# Patient Record
Sex: Male | Born: 1945
Health system: Southern US, Community
[De-identification: ages and names within clinical notes are randomized; demographics above are authoritative.]

## PROBLEM LIST (undated history)

## (undated) DIAGNOSIS — E785 Hyperlipidemia, unspecified: Secondary | ICD-10-CM

## (undated) DIAGNOSIS — R51 Headache: Secondary | ICD-10-CM

## (undated) DIAGNOSIS — I2510 Atherosclerotic heart disease of native coronary artery without angina pectoris: Secondary | ICD-10-CM

## (undated) DIAGNOSIS — R7303 Prediabetes: Secondary | ICD-10-CM

## (undated) DIAGNOSIS — I340 Nonrheumatic mitral (valve) insufficiency: Secondary | ICD-10-CM

## (undated) DIAGNOSIS — I495 Sick sinus syndrome: Secondary | ICD-10-CM

## (undated) DIAGNOSIS — Z973 Presence of spectacles and contact lenses: Secondary | ICD-10-CM

## (undated) DIAGNOSIS — N183 Chronic kidney disease, stage 3 unspecified: Secondary | ICD-10-CM

## (undated) DIAGNOSIS — Z9889 Other specified postprocedural states: Secondary | ICD-10-CM

## (undated) DIAGNOSIS — E559 Vitamin D deficiency, unspecified: Secondary | ICD-10-CM

## (undated) DIAGNOSIS — I428 Other cardiomyopathies: Secondary | ICD-10-CM

## (undated) DIAGNOSIS — I10 Essential (primary) hypertension: Secondary | ICD-10-CM

## (undated) DIAGNOSIS — H269 Unspecified cataract: Secondary | ICD-10-CM

## (undated) DIAGNOSIS — I493 Ventricular premature depolarization: Secondary | ICD-10-CM

## (undated) DIAGNOSIS — I2699 Other pulmonary embolism without acute cor pulmonale: Secondary | ICD-10-CM

## (undated) DIAGNOSIS — R519 Headache, unspecified: Secondary | ICD-10-CM

## (undated) DIAGNOSIS — K4090 Unilateral inguinal hernia, without obstruction or gangrene, not specified as recurrent: Secondary | ICD-10-CM

## (undated) DIAGNOSIS — M1990 Unspecified osteoarthritis, unspecified site: Secondary | ICD-10-CM

## (undated) DIAGNOSIS — T7840XA Allergy, unspecified, initial encounter: Secondary | ICD-10-CM

## (undated) DIAGNOSIS — J302 Other seasonal allergic rhinitis: Secondary | ICD-10-CM

## (undated) HISTORY — DX: Ventricular premature depolarization: I49.3

## (undated) HISTORY — DX: Atherosclerotic heart disease of native coronary artery without angina pectoris: I25.10

## (undated) HISTORY — DX: Chronic kidney disease, stage 3 (moderate): N18.3

## (undated) HISTORY — PX: OTHER SURGICAL HISTORY: SHX169

## (undated) HISTORY — PX: COLONOSCOPY: SHX174

## (undated) HISTORY — DX: Vitamin D deficiency, unspecified: E55.9

## (undated) HISTORY — DX: Other cardiomyopathies: I42.8

## (undated) HISTORY — DX: Allergy, unspecified, initial encounter: T78.40XA

## (undated) HISTORY — DX: Other seasonal allergic rhinitis: J30.2

## (undated) HISTORY — DX: Chronic kidney disease, stage 3 unspecified: N18.30

## (undated) HISTORY — DX: Unspecified osteoarthritis, unspecified site: M19.90

## (undated) HISTORY — DX: Essential (primary) hypertension: I10

## (undated) HISTORY — DX: Other pulmonary embolism without acute cor pulmonale: I26.99

## (undated) HISTORY — PX: COLONOSCOPY W/ BIOPSIES AND POLYPECTOMY: SHX1376

## (undated) HISTORY — PX: HIP ARTHROPLASTY: SHX981

## (undated) HISTORY — DX: Hyperlipidemia, unspecified: E78.5

## (undated) HISTORY — DX: Sick sinus syndrome: I49.5

## (undated) HISTORY — PX: CARDIAC CATHETERIZATION: SHX172

## (undated) HISTORY — DX: Unspecified cataract: H26.9

## (undated) HISTORY — DX: Nonrheumatic mitral (valve) insufficiency: I34.0

---

## 2000-02-08 ENCOUNTER — Encounter: Payer: Self-pay | Admitting: Family Medicine

## 2000-02-08 ENCOUNTER — Ambulatory Visit (HOSPITAL_COMMUNITY): Admission: RE | Admit: 2000-02-08 | Discharge: 2000-02-08 | Payer: Self-pay

## 2001-12-22 ENCOUNTER — Encounter: Admission: RE | Admit: 2001-12-22 | Discharge: 2001-12-22 | Payer: Self-pay | Admitting: Family Medicine

## 2001-12-22 ENCOUNTER — Encounter: Payer: Self-pay | Admitting: Family Medicine

## 2005-12-16 ENCOUNTER — Encounter: Payer: Self-pay | Admitting: Gastroenterology

## 2006-06-24 ENCOUNTER — Ambulatory Visit (HOSPITAL_COMMUNITY): Admission: RE | Admit: 2006-06-24 | Discharge: 2006-06-24 | Payer: Self-pay | Admitting: Internal Medicine

## 2007-01-16 ENCOUNTER — Ambulatory Visit (HOSPITAL_COMMUNITY): Admission: RE | Admit: 2007-01-16 | Discharge: 2007-01-16 | Payer: Self-pay | Admitting: Specialist

## 2007-01-26 ENCOUNTER — Encounter: Admission: RE | Admit: 2007-01-26 | Discharge: 2007-01-26 | Payer: Self-pay | Admitting: Specialist

## 2007-02-20 ENCOUNTER — Encounter: Admission: RE | Admit: 2007-02-20 | Discharge: 2007-02-20 | Payer: Self-pay | Admitting: Specialist

## 2007-03-06 ENCOUNTER — Encounter: Admission: RE | Admit: 2007-03-06 | Discharge: 2007-03-06 | Payer: Self-pay | Admitting: Specialist

## 2007-10-31 ENCOUNTER — Ambulatory Visit: Payer: Self-pay

## 2007-11-01 ENCOUNTER — Ambulatory Visit (HOSPITAL_COMMUNITY): Admission: RE | Admit: 2007-11-01 | Discharge: 2007-11-01 | Payer: Self-pay | Admitting: Orthopedic Surgery

## 2007-11-16 ENCOUNTER — Ambulatory Visit: Payer: Self-pay | Admitting: Cardiovascular Disease

## 2007-11-16 LAB — CONVERTED CEMR LAB
Basophils Absolute: 0 10*3/uL (ref 0.0–0.1)
CO2: 28 meq/L (ref 19–32)
Calcium: 9.2 mg/dL (ref 8.4–10.5)
Chloride: 104 meq/L (ref 96–112)
Lymphocytes Relative: 44.2 % (ref 12.0–46.0)
MCHC: 34.4 g/dL (ref 30.0–36.0)
Neutro Abs: 1.9 10*3/uL (ref 1.4–7.7)
Neutrophils Relative %: 46.8 % (ref 43.0–77.0)
Prothrombin Time: 13 s (ref 10.9–13.3)
RDW: 12.7 % (ref 11.5–14.6)
Sodium: 139 meq/L (ref 135–145)
aPTT: 28.7 s (ref 21.7–29.8)

## 2007-11-17 ENCOUNTER — Inpatient Hospital Stay (HOSPITAL_BASED_OUTPATIENT_CLINIC_OR_DEPARTMENT_OTHER): Admission: RE | Admit: 2007-11-17 | Discharge: 2007-11-17 | Payer: Self-pay | Admitting: Cardiovascular Disease

## 2007-11-17 ENCOUNTER — Ambulatory Visit: Payer: Self-pay | Admitting: Internal Medicine

## 2007-11-21 ENCOUNTER — Ambulatory Visit: Payer: Self-pay | Admitting: Cardiovascular Disease

## 2007-11-30 ENCOUNTER — Ambulatory Visit: Payer: Self-pay | Admitting: Cardiovascular Disease

## 2007-12-20 ENCOUNTER — Inpatient Hospital Stay (HOSPITAL_COMMUNITY): Admission: RE | Admit: 2007-12-20 | Discharge: 2007-12-23 | Payer: Self-pay | Admitting: Orthopedic Surgery

## 2008-04-12 ENCOUNTER — Ambulatory Visit (HOSPITAL_COMMUNITY): Admission: RE | Admit: 2008-04-12 | Discharge: 2008-04-12 | Payer: Self-pay | Admitting: Internal Medicine

## 2008-05-07 ENCOUNTER — Ambulatory Visit (HOSPITAL_COMMUNITY): Admission: RE | Admit: 2008-05-07 | Discharge: 2008-05-07 | Payer: Self-pay | Admitting: Internal Medicine

## 2008-05-24 ENCOUNTER — Encounter (INDEPENDENT_AMBULATORY_CARE_PROVIDER_SITE_OTHER): Payer: Self-pay | Admitting: Emergency Medicine

## 2008-05-24 ENCOUNTER — Ambulatory Visit: Payer: Self-pay | Admitting: Cardiovascular Disease

## 2008-05-24 ENCOUNTER — Ambulatory Visit: Payer: Self-pay | Admitting: Vascular Surgery

## 2008-05-24 ENCOUNTER — Inpatient Hospital Stay (HOSPITAL_COMMUNITY): Admission: EM | Admit: 2008-05-24 | Discharge: 2008-05-26 | Payer: Self-pay | Admitting: Emergency Medicine

## 2008-05-25 ENCOUNTER — Encounter (INDEPENDENT_AMBULATORY_CARE_PROVIDER_SITE_OTHER): Payer: Self-pay | Admitting: *Deleted

## 2008-05-29 ENCOUNTER — Ambulatory Visit: Payer: Self-pay | Admitting: Cardiology

## 2008-05-29 LAB — CONVERTED CEMR LAB: Prothrombin Time: 27.3 s — ABNORMAL HIGH (ref 10.9–13.3)

## 2008-05-31 ENCOUNTER — Ambulatory Visit: Payer: Self-pay | Admitting: Cardiovascular Disease

## 2008-06-07 ENCOUNTER — Ambulatory Visit: Payer: Self-pay | Admitting: Cardiology

## 2008-06-10 ENCOUNTER — Ambulatory Visit: Payer: Self-pay | Admitting: Cardiovascular Disease

## 2008-06-10 ENCOUNTER — Encounter: Payer: Self-pay | Admitting: Cardiovascular Disease

## 2008-06-10 DIAGNOSIS — I429 Cardiomyopathy, unspecified: Secondary | ICD-10-CM | POA: Insufficient documentation

## 2008-06-10 DIAGNOSIS — Z86711 Personal history of pulmonary embolism: Secondary | ICD-10-CM

## 2008-06-17 ENCOUNTER — Ambulatory Visit: Payer: Self-pay | Admitting: Internal Medicine

## 2008-06-28 ENCOUNTER — Ambulatory Visit: Payer: Self-pay | Admitting: Internal Medicine

## 2008-07-12 ENCOUNTER — Ambulatory Visit: Payer: Self-pay | Admitting: Cardiology

## 2008-08-09 ENCOUNTER — Ambulatory Visit: Payer: Self-pay | Admitting: Internal Medicine

## 2008-08-20 ENCOUNTER — Encounter: Payer: Self-pay | Admitting: *Deleted

## 2008-09-03 ENCOUNTER — Encounter (INDEPENDENT_AMBULATORY_CARE_PROVIDER_SITE_OTHER): Payer: Self-pay | Admitting: Cardiology

## 2008-09-03 ENCOUNTER — Ambulatory Visit: Payer: Self-pay | Admitting: Internal Medicine

## 2008-09-03 LAB — CONVERTED CEMR LAB: Protime: 18.2

## 2008-09-25 ENCOUNTER — Encounter: Payer: Self-pay | Admitting: *Deleted

## 2008-10-01 ENCOUNTER — Encounter (INDEPENDENT_AMBULATORY_CARE_PROVIDER_SITE_OTHER): Payer: Self-pay | Admitting: Cardiology

## 2008-10-01 ENCOUNTER — Ambulatory Visit: Payer: Self-pay | Admitting: Cardiovascular Disease

## 2008-10-10 ENCOUNTER — Telehealth: Payer: Self-pay | Admitting: Cardiovascular Disease

## 2008-11-08 ENCOUNTER — Emergency Department (HOSPITAL_COMMUNITY): Admission: EM | Admit: 2008-11-08 | Discharge: 2008-11-08 | Payer: Self-pay | Admitting: Emergency Medicine

## 2008-11-08 ENCOUNTER — Ambulatory Visit: Payer: Self-pay | Admitting: Cardiology

## 2008-11-14 ENCOUNTER — Encounter: Payer: Self-pay | Admitting: Internal Medicine

## 2008-11-15 ENCOUNTER — Ambulatory Visit (HOSPITAL_COMMUNITY): Admission: RE | Admit: 2008-11-15 | Discharge: 2008-11-15 | Payer: Self-pay | Admitting: Internal Medicine

## 2008-11-18 ENCOUNTER — Encounter (INDEPENDENT_AMBULATORY_CARE_PROVIDER_SITE_OTHER): Payer: Self-pay | Admitting: Cardiology

## 2008-11-28 ENCOUNTER — Ambulatory Visit: Payer: Self-pay | Admitting: Cardiovascular Disease

## 2008-11-28 ENCOUNTER — Ambulatory Visit: Payer: Self-pay

## 2008-11-28 ENCOUNTER — Encounter: Payer: Self-pay | Admitting: Cardiovascular Disease

## 2008-12-06 ENCOUNTER — Ambulatory Visit: Payer: Self-pay

## 2008-12-06 ENCOUNTER — Encounter: Payer: Self-pay | Admitting: Cardiovascular Disease

## 2008-12-10 ENCOUNTER — Encounter: Payer: Self-pay | Admitting: Cardiology

## 2009-04-22 ENCOUNTER — Encounter (INDEPENDENT_AMBULATORY_CARE_PROVIDER_SITE_OTHER): Payer: Self-pay

## 2009-04-22 LAB — HM COLONOSCOPY

## 2009-04-23 ENCOUNTER — Ambulatory Visit: Payer: Self-pay | Admitting: Internal Medicine

## 2009-04-23 ENCOUNTER — Encounter (INDEPENDENT_AMBULATORY_CARE_PROVIDER_SITE_OTHER): Payer: Self-pay | Admitting: *Deleted

## 2009-04-29 ENCOUNTER — Telehealth: Payer: Self-pay | Admitting: Internal Medicine

## 2009-05-06 ENCOUNTER — Ambulatory Visit: Payer: Self-pay | Admitting: Internal Medicine

## 2009-05-08 ENCOUNTER — Encounter: Payer: Self-pay | Admitting: Internal Medicine

## 2009-08-12 ENCOUNTER — Ambulatory Visit: Payer: Self-pay | Admitting: Cardiovascular Disease

## 2010-03-05 ENCOUNTER — Encounter: Payer: Self-pay | Admitting: Cardiovascular Disease

## 2010-03-05 ENCOUNTER — Ambulatory Visit (HOSPITAL_COMMUNITY)
Admission: RE | Admit: 2010-03-05 | Discharge: 2010-03-05 | Payer: Self-pay | Source: Home / Self Care | Attending: Cardiovascular Disease | Admitting: Cardiovascular Disease

## 2010-03-05 ENCOUNTER — Ambulatory Visit: Payer: Self-pay | Admitting: Cardiovascular Disease

## 2010-04-23 NOTE — Procedures (Signed)
Summary: Colonoscopy  Patient: [name] Note: All result statuses are Final unless otherwise noted.  Tests: (1) Colonoscopy (COL)   COL Colonoscopy           DONE     [facility]     [address]     [place], [place]  [address]           COLONOSCOPY PROCEDURE REPORT           PATIENT:  Marcus, Griffin  MR#:  [id number]     BIRTHDATE:  1945/05/29, 63 yrs. old  GENDER:  male           ENDOSCOPIST:  Wilhemina Bonito. Eda Keys, MD     Referred by:  Marisue Brooklyn, D.O.           PROCEDURE DATE:  05/06/2009     PROCEDURE:  Colonoscopy with snare polypectomy     ASA CLASS:  Class II     INDICATIONS:  history of pre-cancerous (adenomatous) colon polyps     (index exam 11-2005 w/ hp and adenomas - Dr. Kinnie Scales)           MEDICATIONS:   Fentanyl 75 mcg IV, Versed 7 mg IV           DESCRIPTION OF PROCEDURE:   After the risks benefits and     alternatives of the procedure were thoroughly explained, informed     consent was obtained.  Digital rectal exam was performed and     revealed no abnormalities.   The LB [id number] endoscope     was introduced through the anus and advanced to the cecum, which     was identified by both the appendix and ileocecal valve, without     limitations. Time to cecum = 2:21 min. The quality of the prep was     excellent, using MoviPrep.  The instrument was then slowly     withdrawn (time = 12:15 min) as the colon was fully examined.     <<PROCEDUREIMAGES>>           FINDINGS:  A diminutive polyp was found in the sigmoid colon.     Polyp was snared without cautery. Retrieval was successful. snare     polyp  Moderate diverticulosis was found throughout the colon.     Retroflexed views in the rectum revealed no abnormalities.    The     scope was then withdrawn from the patient and the procedure     completed.           COMPLICATIONS:  None           ENDOSCOPIC IMPRESSION:     1) Diminutive polyp in the sigmoid colon - removed     2) Moderate  diverticulosis throughout the colon           RECOMMENDATIONS:     1) Follow up colonoscopy in 5 years           ______________________________     Wilhemina Bonito. Eda Keys, MD           CC:  Marisue Brooklyn, DO; The Patient           n.     eSIGNED:   John N. Eda Keys at 05/06/2009 11:37 AM           [place] Fleming, [id number]  Note: An exclamation mark (!) indicates a result that was not dispersed into the flowsheet. Document Creation Date: 05/06/2009 11:37 AM  _______________________________________________________________________  (1) Order result status: Final Collection or observation date-time: 05/06/2009 11:32 Requested date-time:  Receipt date-time:  Reported date-time:  Referring Physician:   Ordering Physician: Fransico Setters [phone]) Specimen Source:  Source: Launa Grill Order Number: [id number] Lab site:   Appended Document: Colonoscopy     Procedures Next Due Date:    Colonoscopy: 04/2014

## 2010-04-23 NOTE — Letter (Signed)
Summary: Pre Visit No Show Letter  [facility]  [address] [place], [place] [address]   Phone: [phone]  Fax: [phone]        April 22, 2009 MRN: [id number]    [facility] [address] [place], [place]  [address]    Dear Mr. Lawrence,   We have been unable to reach you by phone concerning the pre-procedure visit that you missed on 04/22/2009. For this reason,your procedure scheduled on 05/06/2009 has been cancelled. Our scheduling staff will gladly assist you with rescheduling your appointments at a more convenient time. Please call our office at [phone] between the hours of 8:00am and 5:00pm, press option #2 to reach an appointment scheduler. Please consider updating your contact numbers at this time so that we can reach you by phone in the future with schedule changes or results.    Thank you,    Ulis Rias RN [facility]

## 2010-04-23 NOTE — Assessment & Plan Note (Signed)
Summary: [facility]   Visit Type:  Follow-up Primary Provider:  Marisue Brooklyn, DO  CC:  Right hip pain.  History of Present Illness: 65 yo AAM with h/o HTN, hyperlipidemia, non-obstructive CAD by cath 8/09 and mild LV dysfunction who had a hip replacement 9/09 and presented with SOB 3/10 and was found to have bilateral pulmonary emboli.  Repeat CT chest 11/14/08 with no evidence of PE. Lower extremity venous dopplers 9/10 with no evidence of DVT. Coumadin therapy was stopped 9/10. Echo  11/28/08 with normal LV size and systolic function with grade 2 diastolic dysfunction and mild to moderate MR, trivial AI.   He has been doing well. He tells me that his shoulder pain is better. He has had no chest pains with exertion. His energy level is ok. He does have dyspnea with moderate exertion but this is stable. He has had no swelling in his legs. He has had no palpitations or awareness of irregularity of his heart rhythm.  He continues to have right hip pain with ambulation.   Current Medications (verified): 1)  Amlodipine Besy-Benazepril Hcl 5-10 Mg Caps (Amlodipine Besy-Benazepril Hcl) .Marland Kitchen.. 1 Tab Once Daily 2)  Vitamin D3 5000 Units .Marland Kitchen.. 1 Tab Once Daily 3)  Tylenol Extra Strength 500 Mg Tabs (Acetaminophen) .... Take As Directed..prn 4)  Mucinex 600 Mg Xr12h-Tab (Guaifenesin) .... Take As Directed..prn 5)  Aspirin 81 Mg Tbec (Aspirin) .... Take One Tablet By Mouth Daily 6)  Claritin 10 Mg Tabs (Loratadine) .... As Needed 7)  Zyrtec Allergy 10 Mg Caps (Cetirizine Hcl) .... As Needed \\par  8)  Fenofibrate Micronized 134 Mg Caps (Fenofibrate Micronized) .... Take 1 Capsule By Mouth Once A Day 9)  Zetia 10 Mg Tabs (Ezetimibe) .... Take 1 By Mouth Once Daily  Allergies: 1)  ! * Statins  Past History:  Past Medical History: Reviewed history from 08/12/2009 and no changes required. Hyperlipidemia Hypertension Seasonal allergies Osteoarthritis Bilateral Pulmonary Embolus March  2010 Non-obstructive CAD by cath 2009  Social History: Reviewed history from 08/12/2009 and no changes required. Works full Time-industrial hygeinist Married  Tobacco Use - Former.  Alcohol Use - no Drug Use - no  Review of Systems       The patient complains of fatigue.  The patient denies malaise, fever, weight gain/loss, vision loss, decreased hearing, hoarseness, chest pain, palpitations, shortness of breath, prolonged cough, wheezing, sleep apnea, coughing up blood, abdominal pain, blood in stool, nausea, vomiting, diarrhea, heartburn, incontinence, blood in urine, muscle weakness, joint pain, leg swelling, rash, skin lesions, headache, fainting, dizziness, depression, anxiety, enlarged lymph nodes, easy bruising or bleeding, and environmental allergies.         right hip pain  Vital Signs:  Patient profile:   65 year old male Height:      72 inches Weight:      224 pounds BMI:     30.49 Pulse rate:   60 / minute Pulse rhythm:   irregular BP sitting:   130 / 70  (left arm) Cuff size:   regular  Vitals Entered By: Stanton Kidney, EMT-P (March 05, 2010 2:30 PM)  Physical Exam  General:  General: Well developed, well nourished, NAD Musculoskeletal: Muscle strength 5/5 all ext Psychiatric: Mood and affect normal Neck: No JVD, no carotid bruits, no thyromegaly, no lymphadenopathy. Lungs:Clear bilaterally, no wheezes, rhonci, crackles CV: RRR no murmurs, gallops rubs Abdomen: soft, NT, ND, BS present Extremities: No edema, pulses 2+.    Impression & Recommendations:  Problem #  1:  CAD, NATIVE VESSEL (ICD-414.01) Stable. Continue ASA. He has not tolerated beta blockers in the past because of his bradycardia. He is intolerant of statins. He is on Zetia. His lipids are followed in primary care. No changes.   His updated medication list for this problem includes:    Amlodipine Besy-benazepril Hcl 5-10 Mg Caps (Amlodipine besy-benazepril hcl) .Marland Kitchen... 1 tab once daily     Aspirin 81 Mg Tbec (Aspirin) .Marland Kitchen... Take one tablet by mouth daily  Problem # 2:  MITRAL VALVE DISORDERS (ICD-424.0) Mild to moderate MR. Stable. Repeat echo in one year.   His updated medication list for this problem includes:    Amlodipine Besy-benazepril Hcl 5-10 Mg Caps (Amlodipine besy-benazepril hcl) .Marland Kitchen... 1 tab once daily  Patient Instructions: 1)  Your physician recommends that you schedule a follow-up appointment in: 6 months.

## 2010-04-23 NOTE — Letter (Signed)
Summary: Patient Notice- Polyp Results  [facility]  [address] [place], [place] [address]   Phone: [phone]  Fax: [phone]        May 08, 2009 MRN: [id number]    [facility] [address] [place], [place]  [address]    Dear Mr. Hyun,  I am pleased to inform you that the colon polyp(s) removed during your recent colonoscopy was (were) found to be benign (no cancer detected) upon pathologic examination.  I recommend you have a repeat colonoscopy examination in 5 years to look for recurrent polyps, as having colon polyps increases your risk for having recurrent polyps or even colon cancer in the future.  Should you develop new or worsening symptoms of abdominal pain, bowel habit changes or bleeding from the rectum or bowels, please schedule an evaluation with either your primary care physician or with me.  Additional information/recommendations:  __ No further action with gastroenterology is needed at this time. Please      follow-up with your primary care physician for your other healthcare      needs.  Please call us if you are having persistent problems or have questions about your condition that have not been fully answered at this time.  Sincerely,  Hilarie Fredrickson MD  This letter has been electronically signed by your physician.  Appended Document: Patient Notice- Polyp Results Letter mailed 2.18.11

## 2010-04-23 NOTE — Miscellaneous (Signed)
Summary: dir col scr-age,....em  Clinical Lists Changes  Medications: Added new medication of MOVIPREP 100 GM  SOLR (PEG-KCL-NACL-NASULF-NA ASC-C) As per prep instructions. - Signed Rx of MOVIPREP 100 GM  SOLR (PEG-KCL-NACL-NASULF-NA ASC-C) As per prep instructions.;  #1 x 0;  Signed;  Entered by: Harlow Mares CMA (AAMA);  Authorized by: Hilarie Fredrickson MD;  Method used: Electronically to [facility]*, [address] Shipping/mailing, [place], [place]  [address], Ph: [phone], Fax: [phone]    Prescriptions: MOVIPREP 100 GM  SOLR (PEG-KCL-NACL-NASULF-NA ASC-C) As per prep instructions.  #1 x 0   Entered by:   Harlow Mares CMA (AAMA)   Authorized by:   Hilarie Fredrickson MD   Signed by:   Harlow Mares CMA (AAMA) on 04/23/2009   Method used:   Electronically to        [name]* (retail)       [address]       [address] Shipping/mailing       [place], [place]  [address]       Ph: [phone]       Fax: [phone]   RxID:   [id number]

## 2010-04-23 NOTE — Letter (Signed)
Summary: [facility] Instructions  [facility]  [address] [place], [place] [address]   Phone: [phone]  Fax: [phone]       [name]    1945/10/23    MRN: [id number]        Procedure Day /Date: Tuesday/  05/06/2009     Arrival Time: 9:00am       Procedure Time: 10:00am     Location of Procedure:                     X  [facility] (4th Floor)                       PREPARATION FOR COLONOSCOPY WITH MOVIPREP   Starting 5 days prior to your procedure 05/01/2009 do not eat nuts, seeds, popcorn, corn, beans, peas,  salads, or any raw vegetables.  Do not take any fiber supplements (e.g. Metamucil, Citrucel, and Benefiber).  THE DAY BEFORE YOUR PROCEDURE         DATE: 05/05/2009  DAY: Monday 1.  Drink clear liquids the entire day-NO SOLID FOOD  2.  Do not drink anything colored red or purple.  Avoid juices with pulp.  No orange juice.  3.  Drink at least 64 oz. (8 glasses) of fluid/clear liquids during the day to prevent dehydration and help the prep work efficiently.  CLEAR LIQUIDS INCLUDE: Water Jello Ice Popsicles Tea (sugar ok, no milk/cream) Powdered fruit flavored drinks Coffee (sugar ok, no milk/cream) Gatorade Juice: apple, white grape, white cranberry  Lemonade Clear bullion, consomm, broth Carbonated beverages (any kind) Strained chicken noodle soup Hard Candy                             4.  In the morning, mix first dose of MoviPrep solution:    Empty 1 Pouch A and 1 Pouch B into the disposable container    Add lukewarm drinking water to the top line of the container. Mix to dissolve    Refrigerate (mixed solution should be used within 24 hrs)  5.  Begin drinking the prep at 5:00 p.m. The MoviPrep container is divided by 4 marks.   Every 15 minutes drink the solution down to the next mark (approximately 8 oz) until the full liter is complete.   6.  Follow completed prep with 16 oz of clear liquid of your choice (Nothing  red or purple).  Continue to drink clear liquids until bedtime.  7.  Before going to bed, mix second dose of MoviPrep solution:    Empty 1 Pouch A and 1 Pouch B into the disposable container    Add lukewarm drinking water to the top line of the container. Mix to dissolve    Refrigerate  THE DAY OF YOUR PROCEDURE      DATE: 05/06/2009 DAY: Tuesday  Beginning at 5:00a.m. (5 hours before procedure):         1. Every 15 minutes, drink the solution down to the next mark (approx 8 oz) until the full liter is complete.  2. Follow completed prep with 16 oz. of clear liquid of your choice.    3. You may drink clear liquids until 8:00a.m. (2 HOURS BEFORE PROCEDURE).   MEDICATION INSTRUCTIONS  Unless otherwise instructed, you should take regular prescription medications with a small sip of water   as early as possible the morning of your procedure.  Teryl Lucy will contact you about your Coumadin after Dr. Marina Goodell reviews  stopping it with your cardiologist.          OTHER INSTRUCTIONS  You will need a responsible adult at least 64 years of age to accompany you and drive you home.   This person must remain in the waiting room during your procedure.  Wear loose fitting clothing that is easily removed.  Leave jewelry and other valuables at home.  However, you may wish to bring a book to read or  an iPod/MP3 player to listen to music as you wait for your procedure to start.  Remove all body piercing jewelry and leave at home.  Total time from sign-in until discharge is approximately 2-3 hours.  You should go home directly after your procedure and rest.  You can resume normal activities the  day after your procedure.  The day of your procedure you should not:   Drive   Make legal decisions   Operate machinery   Drink alcohol   Return to work  You will receive specific instructions about eating, activities and medications before you leave.    The above instructions  have been reviewed and explained to me by   _______________________    I fully understand and can verbalize these instructions _____________________________ Date _________

## 2010-04-23 NOTE — Progress Notes (Signed)
Summary: prep ?'s   Phone Note Call from Patient Call back at Work Phone [phone]   Caller: Patient Call For: Dr. Marina Goodell Reason for Call: Talk to Nurse Summary of Call: has prep questions Initial call taken by: Vallarie Mare,  April 29, 2009 1:49 PM  Follow-up for Phone Call        Pt. does not know who did prior colon but thought Dr.Stevenson may know. Message left for medical records at her office.     Appended Document: prep ?'s  Copy of prior colon done by Dr.Belasco in 2007 rec'd and given to Dr.Artasia Thang for review.

## 2010-04-23 NOTE — Procedures (Signed)
Summary: [facility]  [facility]   Imported By: Lennie Odor 04/30/2009 16:53:35  _____________________________________________________________________  External Attachment:    Type:   Image     Comment:   External Document

## 2010-04-23 NOTE — Assessment & Plan Note (Signed)
Summary: rov  Medications Added CLARITIN 10 MG TABS (LORATADINE) as needed ZYRTEC ALLERGY 10 MG CAPS (CETIRIZINE HCL) as needed \\par  MAGNESIUM OXIDE 250 MG TABS (MAGNESIUM OXIDE) Take 1 tablet by mouth two times a day FENOFIBRATE MICRONIZED 134 MG CAPS (FENOFIBRATE MICRONIZED) Take 1 capsule by mouth once a day        Visit Type:  Follow-up Primary Provider:  Marisue Brooklyn, DO  CC:  Some chest pains and cough.  History of Present Illness: 65 yo AAM with h/o HTN, hyperlipidemia, non-obstructive CAD by cath 8/09 and mild LV dysfunction who had a hip replacement 9/09 and presented with SOB 3/10 and was found to have bilateral pulmonary emboli.  Repeat CT chest 11/14/08 with no evidence of PE. Lower extremity venous dopplers 9/10 with no evidence of DVT. Coumadin therapy was stopped 9/10. Echo  11/28/08 with normal LV size and systolic function with grade 2 diastolic dysfunction and mild to moderate MR, trivial AI.   He has been doing well. He tells me that his shoulder pain is better. He has had no chest pains with exertion. He has had no SOB. He has had no swelling in his legs. He has had no palpitations or awareness of irregularity of his heart rhythm. He has not been walking much lately because of hip pain.     Current Medications (verified): 1)  Amlodipine Besy-Benazepril Hcl 5-10 Mg Caps (Amlodipine Besy-Benazepril Hcl) .Marland Kitchen.. 1 Tab Once Daily 2)  Vitamin D3 5000 Units .Marland Kitchen.. 1 Tab Once Daily 3)  Tylenol Extra Strength 500 Mg Tabs (Acetaminophen) .... Take As Directed..prn 4)  Mucinex 600 Mg Xr12h-Tab (Guaifenesin) .... Take As Directed..prn 5)  Aspirin 81 Mg Tbec (Aspirin) .... Take One Tablet By Mouth Daily 6)  Claritin 10 Mg Tabs (Loratadine) .... As Needed 7)  Zyrtec Allergy 10 Mg Caps (Cetirizine Hcl) .... As Needed \\par  8)  Magnesium Oxide 250 Mg Tabs (Magnesium Oxide) .... Take 1 Tablet By Mouth Two Times A Day 9)  Fenofibrate Micronized 134 Mg Caps (Fenofibrate Micronized) .... Take  1 Capsule By Mouth Once A Day  Allergies: 1)  ! * Statins  Past History:  Past Medical History: Hyperlipidemia Hypertension Seasonal allergies Osteoarthritis Bilateral Pulmonary Embolus March 2010 Non-obstructive CAD by cath 2009  Social History: Works full Time  Married  Tobacco Use - Former.  Alcohol Use - no Drug Use - no  Review of Systems  The patient denies fatigue, malaise, fever, weight gain/loss, vision loss, decreased hearing, hoarseness, chest pain, palpitations, shortness of breath, prolonged cough, wheezing, sleep apnea, coughing up blood, abdominal pain, blood in stool, nausea, vomiting, diarrhea, heartburn, incontinence, blood in urine, muscle weakness, joint pain, leg swelling, rash, skin lesions, headache, fainting, dizziness, depression, anxiety, enlarged lymph nodes, easy bruising or bleeding, and environmental allergies.    Vital Signs:  Patient profile:   65 year old male Height:      72 inches Weight:      227 pounds BMI:     30.90 Pulse rate:   79 / minute Pulse rhythm:   regular Resp:     18 per minute BP sitting:   131 / 77  (right arm) Cuff size:   large  Vitals Entered By: Vikki Ports (Aug 12, 2009 12:05 PM)  Physical Exam  General:  General: Well developed, well nourished, NAD HEENT: OP clear, mucus membranes moist Psychiatric: Mood and affect normal Neck: No JVD, no carotid bruits, no thyromegaly, no lymphadenopathy. Lungs:Clear bilaterally, no wheezes, rhonci, crackles  CV: RRR no murmurs, gallops rubs Abdomen: soft, NT, ND, BS present Extremities: No edema, pulses 2+.    EKG  Procedure date:  08/12/2009  Findings:      Sinus rhythm with PVCs. LAFB. Small R waves inferio leads but no Qwaves.   Impression & Recommendations:  Problem # 1:  CAD, NATIVE VESSEL (ICD-414.01)  He is doing well. 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 He is on an Ace inhibitor and ASA.   No chest pain. No medication changes.   The following medications were removed from the medication list:    Warfarin Sodium 10 Mg Tabs (Warfarin sodium) ..... Use as directed by anticoagulation clinic His updated medication list for this problem includes:    Amlodipine Besy-benazepril Hcl 5-10 Mg Caps (Amlodipine besy-benazepril hcl) .Marland Kitchen... 1 tab once daily    Aspirin 81 Mg Tbec (Aspirin) .Marland Kitchen... Take one tablet by mouth daily  Orders: EKG w/ Interpretation (93000)  Problem # 2:  ESSENTIAL HYPERTENSION, BENIGN (ICD-401.1) Well controlled. No changes.   His updated medication list for this problem includes:    Amlodipine Besy-benazepril Hcl 5-10 Mg Caps (Amlodipine besy-benazepril hcl) .Marland Kitchen... 1 tab once daily    Aspirin 81 Mg Tbec (Aspirin) .Marland Kitchen... Take one tablet by mouth daily  Problem # 3:  MITRAL VALVE DISORDERS (ICD-424.0) Mild to moderate regurgitation by echo last year. Repeat echo in 6 months.   His updated medication list for this problem includes:    Amlodipine Besy-benazepril Hcl 5-10 Mg Caps (Amlodipine besy-benazepril hcl) .Marland Kitchen... 1 tab once daily  Patient Instructions: 1)  Your physician recommends that you schedule a follow-up appointment in: 6 months 2)  Your physician has requested that you have an echocardiogram.  Echocardiography is a painless test that uses sound waves to create images of your heart. It provides your doctor with information about the size and shape of your heart and how well your heart's chambers and valves are working.  This procedure takes approximately one hour. There are no restrictions for this procedure. To be done in 6 months

## 2010-06-27 LAB — DIFFERENTIAL
Basophils Absolute: 0.1 10*3/uL (ref 0.0–0.1)
Basophils Relative: 1 % (ref 0–1)
Lymphocytes Relative: 43 % (ref 12–46)
Neutro Abs: 1.9 10*3/uL (ref 1.7–7.7)

## 2010-06-27 LAB — POCT I-STAT, CHEM 8
BUN: 13 mg/dL (ref 6–23)
Chloride: 103 mEq/L (ref 96–112)
Creatinine, Ser: 1 mg/dL (ref 0.4–1.5)
Sodium: 140 mEq/L (ref 135–145)
TCO2: 24 mmol/L (ref 0–100)

## 2010-06-27 LAB — BASIC METABOLIC PANEL
CO2: 26 mEq/L (ref 19–32)
Calcium: 8.9 mg/dL (ref 8.4–10.5)
GFR calc Af Amer: >60 mL/min (ref >60)
GFR calc non Af Amer: >60 mL/min (ref >60)
Sodium: 138 mEq/L (ref 135–145)

## 2010-06-27 LAB — POCT CARDIAC MARKERS
CKMB, poc: 1.6 ng/mL (ref 1.0–8.0)
Myoglobin, poc: 98.9 ng/mL (ref 12–200)
Troponin i, poc: <0.05 ng/mL (ref 0.00–0.09)

## 2010-06-27 LAB — CK TOTAL AND CKMB (NOT AT ARMC)
CK, MB: 3.5 ng/mL (ref 0.3–4.0)
Relative Index: 0.9 (ref 0.0–2.5)
Total CK: 392 U/L — ABNORMAL HIGH (ref 7–232)

## 2010-06-27 LAB — APTT: aPTT: 36 seconds (ref 24–37)

## 2010-06-27 LAB — CBC
Hemoglobin: 14.4 g/dL (ref 13.0–17.0)
RBC: 4.61 MIL/uL (ref 4.22–5.81)
WBC: 4.1 10*3/uL (ref 4.0–10.5)

## 2010-06-27 LAB — PROTIME-INR: Prothrombin Time: 25.6 seconds — ABNORMAL HIGH (ref 11.6–15.2)

## 2010-07-02 LAB — LIPID PANEL
Cholesterol: 158 mg/dL (ref 0–200)
LDL Cholesterol: 105 mg/dL — ABNORMAL HIGH (ref 0–99)

## 2010-07-02 LAB — CBC
HCT: 36.9 % — ABNORMAL LOW (ref 39.0–52.0)
HCT: 39.8 % (ref 39.0–52.0)
Hemoglobin: 13.9 g/dL (ref 13.0–17.0)
MCHC: 35 g/dL (ref 30.0–36.0)
Platelets: 196 10*3/uL (ref 150–400)
Platelets: 205 10*3/uL (ref 150–400)
RDW: 14.2 % (ref 11.5–15.5)
WBC: 5.1 10*3/uL (ref 4.0–10.5)

## 2010-07-02 LAB — CARDIAC PANEL(CRET KIN+CKTOT+MB+TROPI)
CK, MB: 3.7 ng/mL (ref 0.3–4.0)
CK, MB: 3.9 ng/mL (ref 0.3–4.0)
Relative Index: 1 (ref 0.0–2.5)
Relative Index: 1.1 (ref 0.0–2.5)
Relative Index: 1.2 (ref 0.0–2.5)
Total CK: 378 U/L — ABNORMAL HIGH (ref 7–232)
Troponin I: 0.17 ng/mL — ABNORMAL HIGH (ref 0.00–0.06)

## 2010-07-02 LAB — POCT CARDIAC MARKERS
CKMB, poc: 2.5 ng/mL (ref 1.0–8.0)
CKMB, poc: 2.6 ng/mL (ref 1.0–8.0)
Myoglobin, poc: 104 ng/mL (ref 12–200)
Troponin i, poc: <0.05 ng/mL (ref 0.00–0.09)

## 2010-07-02 LAB — DIFFERENTIAL
Basophils Absolute: 0 10*3/uL (ref 0.0–0.1)
Basophils Relative: 0 % (ref 0–1)
Eosinophils Relative: 3 % (ref 0–5)
Monocytes Absolute: 0.4 10*3/uL (ref 0.1–1.0)

## 2010-07-02 LAB — PROTIME-INR
INR: 1.3 (ref 0.00–1.49)
Prothrombin Time: 17 seconds — ABNORMAL HIGH (ref 11.6–15.2)

## 2010-07-02 LAB — BASIC METABOLIC PANEL
BUN: 15 mg/dL (ref 6–23)
Creatinine, Ser: 1.39 mg/dL (ref 0.4–1.5)
GFR calc non Af Amer: 52 mL/min — ABNORMAL LOW (ref >60)
Potassium: 3.7 mEq/L (ref 3.5–5.1)

## 2010-07-02 LAB — POCT I-STAT, CHEM 8
BUN: 17 mg/dL (ref 6–23)
Chloride: 106 mEq/L (ref 96–112)
Creatinine, Ser: 1.5 mg/dL (ref 0.4–1.5)
Glucose, Bld: 120 mg/dL — ABNORMAL HIGH (ref 70–99)
Potassium: 4 mEq/L (ref 3.5–5.1)

## 2010-07-02 LAB — CK TOTAL AND CKMB (NOT AT ARMC): Relative Index: 1 (ref 0.0–2.5)

## 2010-08-04 NOTE — H&P (Signed)
NAMEBRENDAN, [name] NO.:  [id number]   MEDICAL RECORD NO.:  [id number]          PATIENT TYPE:  EMS   LOCATION:  MAJO                         FACILITY:  [facility]   PHYSICIAN:  Lucita Ferrara, MD         DATE OF BIRTH:  1945/10/03   DATE OF ADMISSION:  05/24/2008  DATE OF DISCHARGE:                              HISTORY & PHYSICAL   CHIEF COMPLAINT:  Dyspnea.  The patient is a 65-year African American  male status post a recent discharge from the hospital January 15, 2009  with a recent left total hip replacement who presents with a chief  complaint of dyspnea and pleuritic-type chest pain that has been ongoing  for the last 1 month and getting progressively worse.  The patient  denies any orthopnea or paroxysmal nocturnal dyspnea.  The patient  denies any pressure-like type chest pain.  The patient was seen by his  primary care physician's office.  The patient usually sees his primary  care for primary care needs, and was transferred to the emergency room  secondary to EKG changes which include a left anterior fascicular block.  The patient had a catheterization back in August of 2009 which was  performed by Dr. Verne Carrow, which showed moderate  nonobstructive coronary artery disease and nondiagnostic for a left  ventricular ejection fraction but had increased filling pressure.  The  patient states that since his hip surgery, he has been basically  immobile.  The patient denies a history of hypercoagulability or a  family history of such.  Otherwise his 12-point review of systems is  negative.  He denies any fevers or chills, nausea, vomiting, urinary  frequency, urgency or burning.   PAST MEDICAL HISTORY:  Significant for:  1. Osteoarthritis, status post left hip replacement.  2. Hypertension.  3. Hyperlipidemia.  4. Nonobstructive coronary artery disease.   ALLERGIES:  ALLERGIC TO ZOCOR.   MEDICATIONS:  The patient unable to verify his medications.   REVIEW OF SYSTEMS:  As per HPI, otherwise negative.   FAMILY HISTORY:  Noncontributory.   SOCIAL HISTORY:  The patient chews tobacco.  Denies drugs or alcohol.   PHYSICAL EXAMINATION:  Generally speaking, the patient is a pleasant  African American male in no acute distress.  Blood pressure is 127/65,  pulse 70, respirations 18, pulse ox is actually 98% on room air.  HEENT:  Normocephalic, atraumatic.  Sclerae are anicteric.  PERRLA.  Extraocular muscles intact.  Neck is supple.  No JVD, no carotid bruits.  CARDIOVASCULAR:  S1 and S2.  Regular rate and rhythm.  No murmurs, rubs  or clicks.  LUNGS:  Clear to auscultation bilaterally.  No rhonchi, rales or  wheezes.  ABDOMEN:  Soft, nontender, nondistended.  Positive bowel sounds.  EXTREMITIES:  No clubbing, cyanosis or edema.  NEURO:  Patient is alert and oriented x3.  Cranial nerves II-XII are  grossly intact.   EKG shows left axis deviation, left anterior fascicular block, changed  from prior EKG.  Chest x-ray:  No acute findings.  Troponin essentially  negative.  INR  1.2.  B-type natriuretic peptide is 123.  D-dimer 10.01.  Cardiac markers again negative.  Chest x-ray shows no acute pulmonary  disease and CT angiography shows positive for extensive bilateral  pulmonary emboli.  No other acute findings.   ASSESSMENT:  The patient is a 65 year old with:  1. Pleuritic-type chest pain accompanied by dyspnea with bilateral      pulmonary emboli likely secondary to immobility status post      orthopedic procedure and left hip replacement.  2. Hypertension.  3. Echocardiogram changes and nonobstructive coronary artery disease      that is mild.  4. Osteoarthritis.   DISCUSSION AND PLAN:  The patient will be admitted to the medical  telemetry monitored unit.  The patient will be started on Lovenox and  Coumadin.  The patient will have a 2-D echocardiogram.  Cardiology will  be consulted given the EKG changes.  Cardiac enzymes will  be cycled x3  q.8 h.  The patient will undergo bilateral lower extremity Dopplers to  diagnose  DVT.  Coumadin counseling.  Adequate followup for INR checks  will be established.  The rest of the plans are dependent on his  progress and consultant recommendations.      Lucita Ferrara, MD  Electronically Signed     RR/MEDQ  D:  05/24/2008  T:  05/24/2008  Job:  [id number]

## 2010-08-04 NOTE — Assessment & Plan Note (Signed)
[facility]                            CARDIOLOGY OFFICE NOTE   NAME:[name], [name]                        MRN:          [id number]  DATE:11/16/2007                            DOB:          22-Dec-1945    HISTORY OF PRESENT ILLNESS:  [name] is a pleasant 65 year old African  American male with a past medical history significant for hypertension,  hyperlipidemia, and seasonal allergies, who denies a prior history of  heart disease or diabetes mellitus and presents today for a risk  assessment prior to a planned left hip surgery.  The patient states that  he has been in his normal state of health and was seen in the office of  Dr. Elisabeth Most several weeks ago.  During his visit in Dr. Hardie Pulley  office, he was noted to have slight irregularities of his heart rhythm  and has since had a myocardial perfusion stress study that was abnormal.  The patient denies any episodes of chest discomfort at rest or with  exertion, shortness of breath at rest or with exertion, palpitations,  dizziness, near syncope, syncope, orthopnea, PND, or lower extremity  edema.  His only complaint really is that of pain in his left hip which  is why he has been set up for a hip replacement surgery.  I reviewed the  findings of the stress study with the patient which showed that he did  have an overall reduced ejection fraction with global hypokinesis.  His  ejection fraction was noted to be 42% on the stress test.  More details  of this will be outlined below.   PAST MEDICAL HISTORY:  1. Hypertension.  2. Hyperlipidemia.  3. Seasonal allergies  4. No prior history of diabetes mellitus or heart disease.   PAST SURGICAL HISTORY:  Achilles tendon repair 20 years ago.   ALLERGIES:  AMOXICILLIN and STATIN MEDICATIONS.   MEDICATIONS:  1. Fenofibrate 134 mg once daily.  2. Amlodipine/benazepril 5/10 mg once daily.  3. AndroGel 1 tablet twice daily.   SOCIAL HISTORY:  The  patient smoked briefly when he was younger, but has  not smoked in 30 years.  He never used cigarettes as a habit, but did  smoke cigars occasionally when he was in [place].  He denies the use of  alcohol or illicit drugs.  He is married and has 2 grown children who  are both healthy.  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   FAMILY HISTORY:  The patient has a strong family history of coronary  artery disease with his father dying from a myocardial infarction in his  57s and another brother having a myocardial infarction in his 44s.  His  mother is alive and does not have heart disease.   REVIEW OF SYSTEMS:  As stated in the history of present illness.  It is  otherwise negative.   PHYSICAL EXAMINATION:  GENERAL:  He is a pleasant, middle-aged [place]  American male in no acute distress.  VITAL SIGNS:  Weight 221 pounds, blood pressure 122/80, pulse 70  and  regular, and respirations 12 and unlabored.  NECK:  No JVD.  No carotid bruits.  No lymphadenopathy.  SKIN:  Warm and dry.  HEENT:  Oropharynx, mucous membranes moist.  LUNGS:  Clear to auscultation bilaterally without wheezes, rhonchi, or  crackles noted.  CARDIOVASCULAR:  Regular rate and rhythm with normal first and second  heart sounds and no murmurs, gallops, or rubs noted.  No third heart  sound is noted.  ABDOMEN:  Soft and nontender.  Bowel sounds are present.  EXTREMITIES:  No evidence of edema.  Pulses are 2+ distally in all  extremities.   DIAGNOSTIC STUDIES:  1. A 12-lead electrocardiogram obtained in our office today shows      normal sinus rhythm with a ventricular rate of 70 beats per minute,      left anterior fascicular block, and nonspecific T-wave      abnormalities.  His PR interval is 144 milliseconds and his      corrected QT interval was 432 milliseconds.  2. Myocardial perfusion stress study performed on October 31, 2007, in      our office  shows that the patient was given adenosine for the      stress portion of the test.  He was noted to have occasional PVCs      on telemetry during the test.  There was no ST-segment change      during the test with adenosine infusion.  The left ventricle was      noted to be dilated with moderate global hypokinesis.  An ejection      fraction was estimated at 42%.   ASSESSMENT AND PLAN:  This is a pleasant 65 year old African American  male with a history of hypertension, hyperlipidemia, and very remote  tobacco use with a strong family history of coronary artery disease, who  presents without symptoms, but having recently been found to have  globally reduced systolic function on a nuclear perfusion study.  The  patient does not give any signs or symptoms of congestive heart failure,  arrhythmias, or angina.  He leads me to believe his blood pressure has  been well controlled during the course his lifetime.  He has been on  medical therapy for several years.  The etiology of his reduced systolic  function could be his hypertension or it could be related to ischemia.  There was no evidence of ischemia on his myocardial perfusion stress  study, however, I think it is wise at this time to proceed with a  surface echocardiogram and a diagnostic left heart catheterization to  evaluate his coronary arteries.  The patient is agreeable with this  plan.  I will continue his ACE inhibitor currently and will plan on  having him come in tomorrow to the outpatient cath lab for a left heart  catheterization.  We will obtain a chest x-ray and basic laboratory data  prior to his heart  catheterization.  I will plan on seeing him back in my office in several  weeks to review his testing results.     Verne Carrow, MD  Electronically Signed    CM/MedQ  DD: 11/16/2007  DT: 11/17/2007  Job #: [id number]   cc:   Lovenia Kim, D.O.  Loree Fee, [place]  Luis Abed, MD, [facility]

## 2010-08-04 NOTE — Op Note (Signed)
[name], [name]                 ACCOUNT NO.:  [id number]   MEDICAL RECORD NO.:  [id number]          PATIENT TYPE:  INP   LOCATION:  1614                         FACILITY:  [facility]   PHYSICIAN:  Madlyn Frankel. Charlann Boxer, M.D.  DATE OF BIRTH:  1945/06/17   DATE OF PROCEDURE:  12/20/2007  DATE OF DISCHARGE:                               OPERATIVE REPORT   PREOPERATIVE DIAGNOSIS:  Left hip osteoarthritis.   POSTOPERATIVE DIAGNOSIS:  Left hip osteoarthritis.   PROCEDURE:  Left total hip replacement.   COMPONENTS USED:  DePuy hip system size 58 Pinnacle cup, size 6 standard  Trilock stem, 40 plus 5 metal on metal ball and liner.   SURGEON:  Madlyn Frankel. Charlann Boxer, M.D.   ASSISTANT:  Yetta Glassman. Mann, PA   ANESTHESIA:  General.   ESTIMATED BLOOD LOSS:  700 mL.   DRAINS:  Times one.   COMPLICATIONS:  None.   INDICATIONS FOR PROCEDURE:  [name] is a 65 year old gentleman with  history of progressively advanced left hip osteoarthritis.  He had had  persistent discomfort with radiographic findings of severe degenerative  changes with osteophyte formation.   Given the amount of discomfort he was having, failure of conservative  measures he wished to discuss surgical intervention.  Risks and benefits  of hip replacement were discussed including infection, DVT, component  failure, need for revision surgery.  Consent was obtained for the  benefit of pain relief.  He was noted to have some right hip arthritis  but not nearly as __________ on the left.   PROCEDURE IN DETAIL:  The patient was brought to the operating theater.  Once adequate anesthesia and preoperative antibiotics, 2 g of Ancef  administered, the patient was positioned in the right lateral decubitus  position with the right side up.  The left lower extremity was prepped  and draped in sterile fashion following a prescrub.   Following a time-out a lateral based incision was made over the affected  hip.  Sharp dissection was carried  to the iliotibial band.  The  iliotibial band and gluteal fascia were incised posteriorly for  posterior approach to the hip.  Short external rotators were identified  and taken down separate from the capsule.  I preserved the posterior  capsule by creating an L capsulotomy preserving the posterior leaflet to  protect the sciatic nerve from retractors but also to repair  anatomically at the end of the case.  Severe arthritis was identified as  the hip was dislocated.  Neck osteotomy was made based off the anatomic  landmarks using a size 6 s broach and a 28mm head.  Following this neck  cut I turned to the femur first.  Following my initial debridement I  used a starting drill, opened up the proximal femur and then did a hand  reaming times once.  I irrigated the canal to prevent fat emboli.  I  then began broaching with size 2 broach setting my anteversion at 20-25  degrees pretty close to his native position.  I broached all the way up  to size 6 based  on the position of this, I did use a calcar planer to  finish off the neck cut.   Trial broach was removed and canal packed with a sponge and attention  was now directed to the acetabulum.  Acetabular retractors placed and  severe arthritis and osteophytes noted.  I used a 5/8ths osteotome to  recreate the acetabular anatomy.  I began reaming at 45 reamer and then  went by even numbers by 2 up to a 56 reamer.  At this point there was  good bone preparation down to the medial.  I used a 57 reamer and then  impacted a 58 Pinnacle cup.  This was sitting at approximately 35-40  degrees of abduction and 20 degrees of full flexion at least beneath the  anterior wall.  Two cancellous screws were placed in the ilium.  Once I  confirmed the cup position and I went ahead and selected the 40 mm x 58  mm metal head.  This was impacted with good fit without any evidence of  hang up.   At this point we attended back to the femur.  A trial reduction  was  carried out with a 6 high neck and a 40 ball and I ended up choosing the  40 plus 5 ball based on leg lengths compared with the downed leg in the  preoperative position.  At this point the trial broach was removed,  final perforation of the proximal femur carried out.  The final 6 semi  offset  trial stem was impacted over where the broach sat.  Based on  this I used a 40 plus 5 metal ball.  It was impacted onto clean and dry  trunnion.  The hip was irrigated throughout the case.  Again at this  point some hemostasis was required in the posterior capsular muscular  tissue.  The Hemovac drain was placed deep.  We reapproximated the  posterior capsule to the superior leaflet using #1 Ethibond.  The  iliotibial band and gluteal fascia were then reapproximated using #1  Vicryl and 2-0 Vicryl in the subcu layer and 4-0 running Monocryl.  Skin  was cleaned, dried and dressed sterilely with Steri-Strips and Mepilex  dressing.  He tolerated the procedure well and was extubated and brought  to recovery in stable condition.      Madlyn Frankel Charlann Boxer, M.D.  Electronically Signed     MDO/MEDQ  D:  12/20/2007  T:  12/20/2007  Job:  [id number]

## 2010-08-04 NOTE — Consult Note (Signed)
[name], [name] NO.:  [id number]   MEDICAL RECORD NO.:  [id number]          PATIENT TYPE:  EMS   LOCATION:  MAJO                         FACILITY:  [facility]   PHYSICIAN:  Luis Abed, MD, FACCDATE OF BIRTH:  21-Jul-1945   DATE OF CONSULTATION:  11/08/2008  DATE OF DISCHARGE:  11/08/2008                                 CONSULTATION   PRIMARY CARDIOLOGIST:  Dr. Cristal Deer Meckel.   PRIMARY CARE PHYSICIAN:  Lovenia Kim, DO.   REASON FOR CONSULTATION:  Atypical left shoulder pain with multiple  episodes of unrelated diaphoresis.   HISTORY OF PRESENT ILLNESS:  [name] is a 65 year old African American  male with known moderate nonobstructive coronary artery disease per  cardiac catheterization completed 1 year ago, as well as hypertension,  hyperlipidemia, and history of bilateral pulmonary embolisms likely  secondary to emboli from left hip replacements completed earlier this  year, now therapeutic on Coumadin presenting with atypical left shoulder  pain and unrelated episodes of moderate-to-severe diaphoresis.  The  patient reports left shoulder pain that is reproducible with certain  movements, radiates across the chest, mild in severity, and without any  associated symptoms that began about a week ago.  He also reports  approximately 2 weeks of moderate-to-severe episode of diaphoresis.  One  episode, which was typical was when he woke up in the middle of the  night and another was while he was in church.  Several moderate episodes  in the evening time after already had not fallen asleep.  The patient  saw his primary care Harmonie Verrastro today and described these symptoms and due  to his known coronary artery disease as well as risk factors she sent  him to the emergency room for further eval.  Since arriving to the ED,  the patient's vital signs have been close to his baseline and stable  with O2 saturation 100% on room air.  The patient is afebrile  also.  The  patient has had one full set of cardiac enzymes that was negative.  EKG  and chest x-ray without any acute changes.   PAST MEDICAL HISTORY:  1. CAD, moderate nonobstructive coronary artery disease, last cath      November 17, 2007.  2. History of bilateral pulmonary emboli in May 2010.  3. Hypertension.  4. Hyperlipidemia.  5. Osteoarthritis/left hip replacement prior to bilateral PE.   SOCIAL HISTORY:  The patient lives in [place] with his wife.  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  No  tobacco, EtOH, or illicit drug use history.  No herbal medications.  Eats a regular diet.  No regular exercise.  Does chews tobacco on a  daily basis.   FAMILY MEDICAL HISTORY:  Mother living at age 55 with no history of  coronary artery disease.  Father deceased at age 82 from massive MI that  was his first.  Siblings both deceased at age 57, brothers, one from MI  which was his first and one from heart failure.   REVIEW OF SYSTEMS:  The patient denies any fevers or chills recently.  He denies any change in weight, change in energy.  Denies any  palpitations or change of shortness of breath.  No presyncope or  syncope.  No nausea, vomiting, or diarrhea.  In short, see HPI.  All  other systems reviewed and were negative.   ALLERGIES:  ZOCOR.   MEDICATIONS:  1. Cetirizine 10 mg p.o. daily p.r.n.  2. Mucinex 600 mg 1 tablet q.12 h. p.r.n.  3. Tylenol 500 mg q.6 h. P.r.n.  4. Vitamin D supplement.  5. Lotrel.  6. Amlodipine/benazepril (5/10 mg p.o. daily)  7. Coumadin 10 mg Sunday, Tuesday, Wednesday, Thursday, Saturday and 5      mg Monday and Friday followed at the [facility].   PHYSICAL EXAMINATION:  VITAL SIGNS:  Temperature 98.9 degrees  Fahrenheit, BP 112/80, pulse 59, respiration rate 12.  GENERAL:  The patient is alert and oriented x3 in no apparent distress.  Able to speak and move easily without any respiratory  distress.  HEAD:  Normocephalic, atraumatic.  HEENT:  Pupils equal, round, and reactive to light.  Extraocular motions  are intact.  Nares are pink without discharge.  Dentition is fair.  Oropharynx without erythema or exudates.  NECK:  Supple without lymphadenopathy.  No JVD.  No thyromegaly.  No  bruits.  HEART:  Rate is regular with audible S1, S2.  No clicks, rubs, murmurs,  or gallops.  Pulses are 2+ and equal in radials and 1+ bilaterally in  dorsalis pedis.  LUNGS:  Clear to auscultation bilaterally.  SKIN:  No rash, lesions, or petechiae.  ABDOMEN:  Soft, nontender, nondistended with normal abdominal sounds.  No rebound or guarding.  The patient is overweight.  EXTREMITIES:  No clubbing, cyanosis, or edema.  MUSCULOSKELETAL:  No joint deformity or effusions.  No spinal or CVA  tenderness.  Left shoulder joint pain with abduction and extension of  left upper extremity.  NEUROLOGICAL:  Cranial nerves II through XII grossly intact.  Strength  5/5 in all extremities and axial groups with normal sensation throughout  and normal cerebellar function.   RADIOLOGY:  Chest x-ray shows no cardiopulmonary disease.   EKG:  Normal sinus rhythm at a rate of 78, nonspecific ST-T wave  changes, incomplete right bundle-branch block with question of left  fascicular block as there is a left axis deviation.  No significant Q-  waves.  No evidence of hypertrophy.  Intervals within normal limits.  No  significant change from prior tracing, completed May 24, 2008.   LABORATORY DATA:  WBC of 4.1 with normal differential, HGB 44, HCT 41.7.  PLT count 203.  Sodium 140, potassium 3.6, chloride 103, CO2 of 13, BUN  13, creatinine 1.0, glucose 118.  Point-of-care markers are negative.  First set of cardiac enzymes CK 390, CK-MB 3.5, troponin-I is 0.02.  INR  2.4, aPTT 36.   ASSESSMENT AND PLAN:  [name] is a 65 year old African American male  with a known moderate nonobstructive history of CAD as  per his last cath  on November 17, 2007, hypertension, hyperlipidemia, on chronic Coumadin  after bilateral pulmonary embolisms earlier this year likely secondary  to emboli from left hip replacement presenting with atypical left  shoulder pain with multiple episodes of unrelated diaphoresis.  1. Shoulder pain, not cardiac.  2. Diaphoresis, nonspecific.   Okay to go home.  Followup with Dr. Doug Sou, his primary care  Quierra Silverio.  Note, no objective evidence at all  for cardiac etiology to  any of his symptoms.      Jarrett Ables, [facility]      Luis Abed, MD, [facility]  Electronically Signed    MS/MEDQ  D:  11/08/2008  T:  11/09/2008  Job:  [id number]

## 2010-08-04 NOTE — Assessment & Plan Note (Signed)
[facility]                            CARDIOLOGY OFFICE NOTE   NAME:Cosma, ADREN DOLLINS                        MRN:          [id number]  DATE:11/21/2007                            DOB:          October 01, 1945    HISTORY OF PRESENT ILLNESS:  Mr. Hennes is a pleasant 65 year old African  American male with a past medical history significant for hypertension,  hyperlipidemia, and seasonal allergies who originally was seen in my  office on November 16, 2007, for preoperative risk assessment.  He had  recently had a myocardial perfusion stress study that showed an overall  reduced ejection fraction with global hypokinesis and an ejection  fraction estimated at 42%.  He had not had any symptoms that were  suggestive of angina, congestive heart failure, or arrhythmias.  I  elected on Friday November 17, 2007, to perform a left heart  catheterization.  This was performed at [facility] by Dr.  Arvilla Meres.  His heart catheterization showed diffuse plaque  disease throughout his left anterior descending and circumflex systems.  There were no stenoses that were greater than 40%.  His right coronary  artery showed only mild luminal irregularities.  His ejection fraction  was estimated at 45%.  He was noted to have multiple PVCs during the  heart catheterization.  The patient tolerated the catheterization well  and returns today for followup of this catheterization.  He tells me  that he continues to do well.  His only complaint is of continued pain  in his hip.  He denies any chest pain, shortness of breath, near-  syncope, syncope, orthopnea, PND, or lower extremity edema.  He was  started on Friday on hydrochlorothiazide for increased left ventricular  filling pressures and also on Coreg given his hypertension and left  ventricular systolic dysfunction.  He has tolerated these medication  changes well.   His past medical history now includes diffuse 2-vessel  coronary artery  disease that at this point is nonobstructive with most lesions being  between 30 and 40%.  It also includes hypertension, hyperlipidemia, and  seasonal allergies.  This patient does not have a prior history of  diabetes mellitus.  Of note, he does have contraindications to STATIN  MEDICATIONS, which cause diffuse muscle aches.  He has tried multiple  different statin therapies in the past and they all have caused his  muscle aches.   His current medications include:  1. Fenofibrate 134 mg once daily.  2. Amlodipine/benazepril combination 5/10 mg once daily.  3. Carvedilol 3.125 mg once daily.  4. Hydrochlorothiazide 12.5 mg once daily.  5. Klor-Con 20 mEq once daily.   PHYSICAL EXAMINATION:  GENERAL:  He is a pleasant middle-aged African  American male in no acute distress.  VITAL SIGNS:  Blood pressure 131/80, pulse 71 and irregular with noted  ectopy, respiratory rate 12, and weight 220 pounds, which represents a 1-  pound weight loss since his last visit 4 days ago.  NECK:  No JVD and no carotid bruits.  SKIN:  Warm and dry.  OROPHARYNX:  Mucous membranes  are moist.  LUNGS:  Clear to auscultation bilaterally without wheezes, rhonchi, or  crackles noted.  CARDIOVASCULAR:  Irregularities noted in exam that are most likely  related to ectopy.  There are no murmurs, gallops, or rubs noted.  Normal first and second heart sounds noted.  ABDOMEN:  Soft.  Bowel sounds are present.  EXTREMITIES:  No evidence of edema.  Pulses are 2+ in all extremities.   DIAGNOSTIC STUDIES:  1. Left heart catheterization performed on November 17, 2007, showed      moderate nonobstructive coronary artery disease.  Specifically, the      left anterior descending was a large vessel that coursed to the      apex.  It gave off a large branching first diagonal.  There were      mild luminal irregularities in the LAD with a 30% tubular lesion in      the midportion.  There was a 30-40%  stenosis in the ostium of a      large diagonal.  There was also a 40% stenosis in the upper branch      of the marginal.  The left circumflex was a large codominant      vessel.  It gave off a small-to-moderate size ramus branch and a      tiny first marginal, a large branching second marginal, and a large      posterolateral with a small PDA.  There was diffuse nonobstructive      disease throughout the left circumflex system, 30% in the mid      section and 40% distally.  In the posterolateral branch, there was      a 30-40% proximal lesion.  The right coronary was a moderate size      codominant system with mild luminal irregularities in the mid      section.  The left ventriculogram showed an ejection fraction of      45%.  There were no wall motion abnormalities noted on the left      ventricular angiogram.  His end-diastolic pressure was noted to be      25 with LV pressure of 157/25 and a central aortic pressure of      171/100.  2. Most recent laboratory values show white blood cell count of 4.0,      hemoglobin 14.6, hematocrit 42.4, platelets 251, potassium 3.8,      creatinine 1.3, calcium 9.2, and sodium 139.   ASSESSMENT/PLAN:  This is a pleasant 65 year old African American male  with a past medical history significant for hypertension and  hyperlipidemia who has recently been diagnosed with nonobstructive  diffuse coronary artery disease.  The patient has also recently been  diagnosed with reduced ejection fraction of 42-45% based on two  diagnostic studies.  He is asymptomatic and demonstrates no signs or  symptoms that are suggestive of angina, congestive heart failure, or  arrhythmias.  He continues to work full time and is not limited by any  chest pain or shortness of breath.  His biggest concern at this time is  whether or not he can have his left hip surgery performed.  I think that  the patient is low risk for this surgery based on his cardiac findings.  I would  proceed with his left hip surgery at the earliest convenient  time.  I will plan on continuing his fenofibrate 134 mg once daily, the  amlodipine/benazepril combination for blood pressure control, the  hydrochlorothiazide 12.5 mg once  daily, and I will increase his Coreg to  6.25 mg twice daily.  The patient has been intolerant to a statin in the  past.  Because of this, I will not start one at the current time.  I  will also hold off on starting an aspirin, as he has an upcoming  surgery.  I would like to start him back on an aspirin after his surgery  and consider  a low dose of statin therapy in the future.  I will make no other  changes at the current time.  I will plan on seeing him back in my  office in 6 months.     Verne Carrow, MD  Electronically Signed    CM/MedQ  DD: 11/21/2007  DT: 11/22/2007  Job #: [id number]   cc:   Everardo Beals. Juanda Chance, MD, [facility]  Lovenia Kim, D.O.  Madlyn Frankel Charlann Boxer, M.D.

## 2010-08-04 NOTE — Cardiovascular Report (Signed)
[name], [name]                 ACCOUNT NO.:  [id number]   MEDICAL RECORD NO.:  [id number]          PATIENT TYPE:  OIB   LOCATION:  1965                         FACILITY:  [facility]   PHYSICIAN:  Bevelyn Buckles. Bensimhon, MDDATE OF BIRTH:  06/22/1945   DATE OF PROCEDURE:  11/17/2007  DATE OF DISCHARGE:  11/17/2007                            CARDIAC CATHETERIZATION   PRIMARY CARE PHYSICIAN:  Lovenia Kim, DO   CARDIOLOGIST:  Verne Carrow, MD   PATIENT IDENTIFICATION:  [name] is a very pleasant 65 year old male  with a history of hypertension and hyperlipidemia.  He has no known  documented heart disease.  He underwent a Myoview for occasional  palpitations.  This showed a dilated left ventricle with an EF of about  42%.  There was no infarct or ischemia.  He was referred for  catheterization to rule out underlying ischemic heart disease.   PROCEDURES PERFORMED:  1. Selective coronary angiography.  2. Left heart cath.  3. Left ventriculogram.   DESCRIPTION OF PROCEDURE:  The risks and indication were explained.  Consent was signed and placed on the chart.  A 4-French arterial sheath  was placed in the right femoral artery using a modified Seldinger  technique.  Standard catheters including a JL-4, 3DRC, and angled  pigtail were used for catheterization.  All catheter exchanges were made  over wire.  There were no apparent complications.   HEMODYNAMIC DATA:  Central aortic pressure 171/100 with a mean of 131.  LV pressure was 157/25 and EDP of 25.  There was no aortic stenosis.   The left main was normal.   LAD was a large vessel coursing to the apex.  It gave off a large  branching first diagonal.  There was minor luminal irregularities in the  LAD with approximately about a 30% tubular lesion in the mid section.  On the ostium of the large diagonal, there was approximately 30-40%  stenosis.  There was also 40% stenosis in the upper branch of the  marginal.   The  left circumflex was a large codominant vessel.  It gave off a small  to moderate-sized ramus branch and a tiny OM1, large branching OM2, a  large posterolateral, and a small PDA.  There was diffuse nonobstructive  disease throughout the left circumflex system, 30% in the midsection and  40% distally.  In the posterolateral, there was a 30-40% proximal  lesion.   Right coronary was a moderate-sized codominant system.  This had mild  luminal irregularity in the mid section.   Left ventriculogram done in the RAO position showed a moderately dilated  ventricle.  Ejection fraction was hard to quantify due to the PVCs.  Ejection fraction appeared to be about 45%.   ASSESSMENT:  1. Moderate nonobstructive coronary artery disease as described above.  2. Mild left ventricular dysfunction with ejection fraction hard to      quantify due to premature ventricular contractions.  3. Increased filling pressures.   PLAN/DISCUSSION:  We will proceed with medical therapy with ACE  inhibitors and beta-blockers.  He will need tight control of  his blood  pressure.  He also may benefit from a diuretic in the future.      Bevelyn Buckles. Bensimhon, MD  Electronically Signed     DRB/MEDQ  D:  11/17/2007  T:  11/18/2007  Job:  [id number]

## 2010-08-04 NOTE — Consult Note (Signed)
NAMEREEVES, MUSICK                 ACCOUNT NO.:  [id number]   MEDICAL RECORD NO.:  [id number]          PATIENT TYPE:  INP   LOCATION:  4731                         FACILITY:  [facility]   PHYSICIAN:  Brayton El, MD    DATE OF BIRTH:  03/17/1946   DATE OF CONSULTATION:  DATE OF DISCHARGE:                                 CONSULTATION   ATTENDING PHYSICIAN FOR CONSULT:  Verne Carrow, MD   CHIEF COMPLAINT:  Shortness of breath and chest pain.   HISTORY OF PRESENT ILLNESS:  Mr. Kirchgessner is a 65 year old black male with  past medical history significant for hypertension, hyperlipidemia,  nonobstructive coronary artery disease per left heart catheterization in  August 2009.  He was status post left hip replacement in September 2009  who is admitted today with several week history of a shortness of breath  and intermittent chest pain.  The patient states that he has had dyspnea  on exertion for the past several weeks.  In addition, he has had  discomfort in his chest that feels like he has a cough.  It does not  radiate and does not have any associated symptoms other than the  shortness of breath.  Upon admission, he was diagnosed with bilateral  extensive pulmonary embolism and started on anticoagulation with  Lovenox.  His troponin was 0.19 and Cardiology was consulted to help  address this.  The patient states other than the above-mentioned  symptoms, he has been in his normal state of health.  He reports that  his left hip pain is greatly improved since the replacement.   REVIEW OF SYSTEMS:  As above, otherwise negative.   PAST MEDICAL HISTORY:  As above.   SOCIAL HISTORY:  The patient does not dip, but does chew tobacco.  He  does not drink alcohol.  He does not use drugs.   FAMILY HISTORY:  Noncontributory.   ALLERGIES:  The patient is allergic to [facility].   MEDICATIONS:  The patient is not able to state all his medications.   REVIEW OF SYSTEMS:  As in HPI, all other  systems are reviewed and are  negative.   PHYSICAL EXAMINATION:  VITAL SIGNS:  The temperature is 98.6, pulse 68,  respirations 20, blood pressure 128/81, and sating 95% on room air.  GENERALLY:  No acute distress.  HEENT:  Nonfocal.  NECK:  Supple.  HEART:  Regular rate and rhythm without murmur, rub, or gallop.  LUNGS:  Clear to auscultation bilaterally.  ABDOMEN:  Soft, nontender, nondistended.  EXTREMITIES:  Without edema.  NEUROLOGIC:  Nonfocal.  PSYCHIATRIC:  The patient has appropriate normal levels of insight.  SKIN:  Warm and dry.   EKG is pending at this time.  CT scan of the chest as read by Radiology  positive for extensive bilateral pulmonary emboli.   LABORATORY DATA:  Troponin 0.19.  CK 513, CK-MB 5.0, relative index 1.0.  His BNP was 123.  His BMP; sodium 139, potassium 4.0, chloride 106, BUN  17, creatinine 1.5, glucose 120.  CBC; white count 6, hemoglobin 14,  hematocrit 40, platelet  count 205.  EKG is pending at this time.   ASSESSMENT:  1. Bilateral extensive pulmonary embolism.  2. Intermediate level of troponin that is most likely secondary to      pulmonary embolism.   PLAN:  Agree with anticoagulation with Lovenox and p.o. Coumadin to  treat the pulmonary embolism.  We would recommend checking an  echocardiogram tomorrow to evaluate for right ventricular dysfunction  and possible right heart thrombus.  We would continue to follow serial  cardiac enzymes and troponin.  There is no ischemic workup indicated at  this time as the intermediate troponin is likely secondary to pulmonary  embolism.  However, we will wait until cardiac enzymes completely cycle  and EKG and echocardiogram are obtained to make a final determination  regarding this.      Brayton El, MD  Electronically Signed     SGA/MEDQ  D:  05/24/2008  T:  05/26/2008  Job:  [id number]

## 2010-08-04 NOTE — H&P (Signed)
NAMEGATLIN, [name]                 ACCOUNT NO.:  [id number]   MEDICAL RECORD NO.:  [id number]          PATIENT TYPE:  INP   LOCATION:  NA                           FACILITY:  [facility]   PHYSICIAN:  Madlyn Frankel. Charlann Boxer, M.D.  DATE OF BIRTH:  [birth year]   DATE OF ADMISSION:  12/20/2007  DATE OF DISCHARGE:                              HISTORY & PHYSICAL   PROCEDURE:  Left total hip replacement.   SURGEON:  Madlyn Frankel. Charlann Boxer, M.D.   CHIEF COMPLAINT:  Left hip pain.   HISTORY OF PRESENT ILLNESS:  A 65 year old male with a history of left  hip pain secondary to osteoarthritis.  It has been refractory off of  conservative treatment.  He has been previously assessed by Dr. Andria Meuse.   PAST MEDICAL HISTORY:  1. Osteoarthritis.  2. Hypertension.  3. Hypercholesterolemia.   PAST SURGICAL HISTORY:  Left Achilles tendon repair 20 years ago.   FAMILY HISTORY:  Coronary artery disease.   SOCIAL HISTORY:  Married.  Primary caregiver will be his wife after  surgery.   DRUG ALLERGIES:  No known drug allergies.   MEDICATIONS:  1. Amlodipine.  2. Benazepril p.o., verify dosage and frequency with patient.  3. Benefiber daily.  4. AndroGel 2 times daily.  5. Glucosamine daily.   REVIEW OF SYSTEMS:  DERMATOLOGY:  He has issues with itching.  MUSCULOSKELETAL:  He has pain and morning stiffness, otherwise see HPI.   PHYSICAL EXAMINATION:  Pulse 72, respirations 16, blood pressure 100/74.  GENERAL:  Awake, alert and oriented.  Well-developed and well-nourished.  NECK:  Supple.  No carotid bruits.  CHEST:  Lungs are clear to auscultation bilaterally.  BREASTS:  Deferred.  HEART:  Regular rate and rhythm.  S1 and S2 distinct.  ABDOMEN:  Soft, nontender, nondistended.  Bowel sounds present.  GENITOURINARY:  Deferred.  EXTREMITIES:  Left hip with decreased range of motion and increased  pain.  SKIN:  No cellulitis.  NEUROLOGIC:  Intact to distal sensibilities.   LABORATORY DATA:  Labs, EKG, and  chest x-ray pending pre-surgical  testing.   IMPRESSION:  Left hip osteoarthritis.   PLAN:  1. Left hip arthroplasty at [facility] on 12/20/07 by      surgeon, Dr. Durene Romans.  Questions were encouraged and reviewed.      The risks and complications were discussed.  2. Postoperative medications, including Lovenox, Robaxin, iron,      aspirin, Colace, MiraLax provided at the time of history and      physical.     ______________________________  Yetta Glassman. [address], [place]      Madlyn Frankel. Charlann Boxer, M.D.  Electronically Signed    BLM/MEDQ  D:  12/18/2007  T:  12/18/2007  Job:  [id number]   cc:   Durwin Reges., M.D.  Fax: [phone]

## 2010-08-04 NOTE — H&P (Signed)
[name], [name]                 ACCOUNT NO.:  [id number]   MEDICAL RECORD NO.:  [id number]          PATIENT TYPE:  OUT   LOCATION:                               FACILITY:  [facility]   PHYSICIAN:  Madlyn Frankel. Charlann Boxer, M.D.  DATE OF BIRTH:  Jan 18, 1946   DATE OF ADMISSION:  11/06/2007  DATE OF DISCHARGE:                              HISTORY & PHYSICAL   PROCEDURE:  Left total hip arthroplasty.   CHIEF COMPLAINT:  Left hip pain.   HISTORY OF PRESENT ILLNESS:  A 64 year old male with a history of left  hip pain secondary to osteoarthritis.  It has been refractory to all  conservative treatment.  He has been pre surgically assessed by his  primary care physician, Dr. Andria Meuse.   PAST MEDICAL HISTORY:  Significant for:  1. Osteoarthritis.  2. Hypertension.  3. Hypercholesterolemia.   PAST SURGICAL HISTORY:  Left Achilles tendon repair 20 years ago.   FAMILY HISTORY:  Coronary artery disease.   SOCIAL HISTORY:  Married.  Self employed.  Primary caregiver will be his  wife after surgery.   ALLERGIES:  No known drug allergies.   MEDICATIONS:  1. Amlodipine.  2. Benazepril p.o. daily verified dosage frequency with the patient.  3. Benefiber 834 mg capsule one time a day.  4. AndroGel two times a day.  5. Glucosamine daily.   REVIEW OF SYSTEMS:  DERMATOLOGY:  He has issues with itching.  MUSCULOSKELETAL:  He has back pain and morning stiffness.  Otherwise see  HPI.   PHYSICAL EXAMINATION:  VITAL SIGNS:  Pulse 72, respirations 16, blood  pressure 130/84.  GENERAL:  Awake, alert and oriented.  Well-developed, and well-  nourished.  NECK:  Supple with no carotid bruits.  CHEST:  Lungs clear to auscultation bilaterally.  BREASTS:  Deferred.  HEART:  Regular rate and rhythm.  S1 and S2 distinct.  ABDOMEN:  Soft, nontender, and nondistended.  Bowel sounds are present.  GENITOURINARY:  Deferred.  EXTREMITIES:  Left hip with decreased range of motion and increased  pain.  SKIN:  No  cellulitis.  NEUROLOGY:  Intact distal sensibilities.   LABORATORY DATA:  Labs, EKG, and chest x-ray pending presurgical  testing.   IMPRESSION:  Left hip osteoarthritis.   PLAN:  Left total hip arthroplasty at [facility] on  November 06, 2007, by surgeon, Madlyn Frankel. Charlann Boxer, M.D.  Risks and  complications were discussed.   POSTOPERATIVE MEDICATIONS:  Provided:  1. Lovenox.  2. Robaxin.  3. Iron.  4. Aspirin.  5. Colace.  6. MiraLax.   No pain medicines were provided at time of history and physical and will  be done at the time of surgery.     ______________________________  Yetta Glassman [address], [place]      Madlyn Frankel. Charlann Boxer, M.D.  Electronically Signed    BLM/MEDQ  D:  10/11/2007  T:  10/11/2007  Job:  [id number]   cc:   Durwin Reges., M.D.  Fax: [phone]

## 2010-08-04 NOTE — Discharge Summary (Signed)
[name], [name]                 ACCOUNT NO.:  [id number]   MEDICAL RECORD NO.:  [id number]          PATIENT TYPE:  INP   LOCATION:  4731                         FACILITY:  [facility]   PHYSICIAN:  Peggye Pitt, M.D. DATE OF BIRTH:  Mar 23, 1945   DATE OF ADMISSION:  05/24/2008  DATE OF DISCHARGE:  05/26/2008                               DISCHARGE SUMMARY   DISCHARGE DIAGNOSES:  1. Bilateral pulmonary emboli.  2. Nonobstructive coronary artery disease, status post cardiac      catheterization in August 2009.  3. Osteoarthritis, status post left hip replacement.  4. History of hypertension.  5. History of hyperlipidemia.   DISCHARGE MEDICATIONS:  1. Coumadin 10 mg daily.  2. Lovenox 150 mg injected daily until further adjusted by the [facility].  3. Amlodipine 10 mg daily.  4. Benazepril 5 mg daily.  5. Coreg 12.5 mg daily.  6. Fenofibrate 134 mg daily.   DISPOSITION AND FOLLOWUP:  [name] is discharged home in stable  condition.  Please note that he has had a pulmonary emboli.  He will be  following up with the [facility] on Tuesday, May 28, 2008.  Given that today is a Sunday, have not been able to arrange this  appointment for him, however, I have called the [facility] sharp and he  will leave a voice mail for the office to call the patient on Monday for  an appointment.  Nonetheless, I have provided the patient with [facility] number and he has been instructed to call on Monday afternoon if  he has not heard from them by then.   CONSULTATION THIS HOSPITALIZATION:  Dr. Freida Busman with [facility].   IMAGES AND PROCEDURES THIS HOSPITALIZATION:  1. A chest x-ray on May 24, 2008 that showed no acute cardiopulmonary      abnormalities.  2. A CT angiogram of the chest on May 24, 2008 that was positive for      extensive bilateral pulmonary emboli with no pulmonary infarct      identified and no other acute changes.   HISTORY AND  PHYSICAL EXAM:  For full details, please see history and  physical on May 24, 2008 by Dr. Flonnie Overman, but in brief, [name] is a  very pleasant 65 year old African American man who in October 2009  underwent a left total hip replacement and has been quite sedentary  since then, return to presenting with shortness of breath and pleuritic-  type chest pain.  While in the emergency department, he was found to  have bilateral extensive pulmonary emboli and the hospitalist service  was called to admit him for further evaluation and management.   HOSPITAL COURSE BY ACTIVE PROBLEM:  1. Bilateral pulmonary emboli, immediately started on therapeutic      doses of Lovenox as well as Coumadin.  Pharmacy has been dosing      both while in the hospital.  On day of discharge, his INR is 1.3.      He has on day 3 of 5 of the Lovenox overlap  per the venous      thromboembolism core measures.  He needs to be on Lovenox for at      least two more days.  He will follow up with a [facility] on Tuesday, please see disposition and followup section for      details of this appointment.  At that time, he will be given      further instructions on whether or not he needs to continue the      Lovenox and his Coumadin dosage.  2. Increase troponins.  Given his history of coronary artery disease,      [facility] was consulted.  They have seen the patient and      believe that his elevated troponins are most likely secondary to      his pulmonary embolism especially given his recent cath that showed      nonobstructive coronary artery disease, no further cardiac workup      indicated at this time.  3. Hypertension.  He has maintained excellent blood pressure control      while in the hospital.  He was kept on his home medications.  4. For hyperlipidemia, his fasting panel while in the hospital showed      a total cholesterol of 158, triglycerides of 115, HDL of 30 and an      LDL of 105.  He  has been maintained on fenofibrate.  5. History of coronary artery disease.  This has been stable and      asymptomatic.  6. History of chronic medical problems, has not been an issue this      hospitalization.   VITAL SIGNS ON DAY OF DISCHARGE:  Blood pressure 131/74, heart rate 60,  respirations 20, O2 saturations 99% on room air with a temperature of  97.6.   LABS ON DAY OF DISCHARGE:  Sodium 138, potassium 3.7, chloride 103,  bicarb 27, BUN 15, creatinine 1.39 with a glucose of 122.  WBCs 5.1,  hemoglobin 12.9, and platelet count of 196 and an INR of 1.3.      Peggye Pitt, M.D.  Electronically Signed     EH/MEDQ  D:  05/26/2008  T:  05/26/2008  Job:  [id number]   cc:   Verne Carrow, MD  Dr. Freida Busman

## 2010-08-07 NOTE — Discharge Summary (Signed)
[name], [name]                 ACCOUNT NO.:  [id number]   MEDICAL RECORD NO.:  [id number]          PATIENT TYPE:  INP   LOCATION:  1614                         FACILITY:  [facility]   PHYSICIAN:  Madlyn Frankel. Charlann Boxer, M.D.  DATE OF BIRTH:  05-22-1945   DATE OF ADMISSION:  12/20/2007  DATE OF DISCHARGE:  12/23/2007                               DISCHARGE SUMMARY   ADMITTING DIAGNOSES:  1. Osteoarthritis.  2. Hypertension.  3. Hypercholesteremia.   DISCHARGE DIAGNOSES:  1. Osteoarthritis.  2. Hypertension.  3. Hypercholesteremia.   HISTORY OF PRESENT ILLNESS:  A 65 year old male with a history of left  hip pain secondary to osteoarthritis refractory to all conservative  treatment.   CONSULTS:  None.   PROCEDURE:  Left total hip replacement by surgeon, Dr. Durene Romans, and  assistant, Dwyane Luo PA.   LABORATORY DATA:  CBC upon admission:  White blood cell 3.3, hemoglobin  14.1, hematocrit 41.6, platelets 210,000.  At time of discharge, white  blood cells 7, hemoglobin 11, hematocrit 31.6, and platelets 179,000.  White cell differential all within normal limits.  Coagulation all  within normal limits.  Routine chemistry:  No significant abnormalities.  Admission final reading:  Sodium 138, potassium 3.9, glucose 125,  creatinine 1.36.  GFR final reading 53 with calcium of 8.5.  His UA was  negative.   Cardiology:  EKG showed normal sinus rhythm.   Radiology:  Portable pelvis showed a stable left hip arthroplasty.  No  chest x-ray found on chart.   HOSPITAL COURSE:  The patient underwent a left total hip replacement and  was admitted to the orthopedic floor.  His stay was unremarkable.  He  made excellent progress with physical therapy.  He remained  hemodynamically stable throughout.  His dressing was changed on a daily  basis.  Wound looked great with no significant drainage.  He was  weightbearing as tolerated with physical therapy and was able to meet  all goals of rehab by  day 3.  He was seen on the 3rd.  He was stable,  afebrile.  He had bowel movement which we kept him in the hospital for  an additional day but otherwise was stable and ready for discharge.   DISCHARGE DISPOSITION:  Discharged home with home health care PT.  He is  stable in improved condition.   DISCHARGE PHYSICAL THERAPY:  Weightbearing as tolerated with the use of  a rolling walker.   DISCHARGE MEDICATIONS:  1. Lovenox 40 mg subcu q.24 times 11 days.  2. Robaxin 500 mg p.o. q.6.  3. Enteric-coated aspirin 325 mg 1 p.o. daily x4 weeks after Lovenox      completed.  4. Iron 325 mg p.o. t.i.d. x2 weeks.  5. Colace 100 mg p.o. b.i.d. x2 weeks.  6. MiraLax 17 gm p.o. daily.  7. Vicodin 5/325 one to two p.o. q.4-6 p.r.n. pain.  8. Celebrex 200 mg 1 p.o. b.i.d. x2 weeks.  9. Amlodipine/benazepril 5/10 one p.o. q.p.m.  10.Fenofibrate 134 mg p.o. q.p.m.  11.Carvedilol 6.25 mg 1 p.o. q.a.m. and 1 p.o. q.p.m.  12.HCTZ  12.5 mg 1 p.o. q.a.m.  13.Klor-Con 20 mEq 1 p.o. q.a.m.  14.AndroGel 7.5, 6 pumps daily.   DISCHARGE FOLLOWUP:  Followup with Dr. Charlann Boxer, phone number [phone], for  wound check.     ______________________________  [name], [name]      Madlyn Frankel. Charlann Boxer, M.D.  Electronically Signed    BLM/MEDQ  D:  01/16/2008  T:  01/16/2008  Job:  [id number]   cc:   Durwin Reges., M.D.  Fax: [phone]   Verne Carrow, MD  [address]  [place] [address]

## 2010-08-20 ENCOUNTER — Emergency Department (HOSPITAL_COMMUNITY): Payer: No Typology Code available for payment source

## 2010-08-20 ENCOUNTER — Emergency Department (HOSPITAL_COMMUNITY)
Admission: EM | Admit: 2010-08-20 | Discharge: 2010-08-20 | Disposition: A | Discharge status: Home / Self Care | Payer: No Typology Code available for payment source | Attending: Emergency Medicine | Admitting: Emergency Medicine

## 2010-08-20 DIAGNOSIS — M7989 Other specified soft tissue disorders: Secondary | ICD-10-CM | POA: Insufficient documentation

## 2010-08-20 DIAGNOSIS — T148XXA Other injury of unspecified body region, initial encounter: Secondary | ICD-10-CM | POA: Insufficient documentation

## 2010-08-20 DIAGNOSIS — M1610 Unilateral primary osteoarthritis, unspecified hip: Secondary | ICD-10-CM | POA: Insufficient documentation

## 2010-08-20 DIAGNOSIS — M545 Low back pain, unspecified: Secondary | ICD-10-CM | POA: Insufficient documentation

## 2010-08-20 DIAGNOSIS — M79609 Pain in unspecified limb: Secondary | ICD-10-CM | POA: Insufficient documentation

## 2010-08-20 DIAGNOSIS — M542 Cervicalgia: Secondary | ICD-10-CM | POA: Insufficient documentation

## 2010-08-20 DIAGNOSIS — E785 Hyperlipidemia, unspecified: Secondary | ICD-10-CM | POA: Insufficient documentation

## 2010-08-20 DIAGNOSIS — Z79899 Other long term (current) drug therapy: Secondary | ICD-10-CM | POA: Insufficient documentation

## 2010-08-20 DIAGNOSIS — S0990XA Unspecified injury of head, initial encounter: Secondary | ICD-10-CM | POA: Insufficient documentation

## 2010-08-20 DIAGNOSIS — M169 Osteoarthritis of hip, unspecified: Secondary | ICD-10-CM | POA: Insufficient documentation

## 2010-08-20 DIAGNOSIS — Z86718 Personal history of other venous thrombosis and embolism: Secondary | ICD-10-CM | POA: Insufficient documentation

## 2010-08-20 DIAGNOSIS — S139XXA Sprain of joints and ligaments of unspecified parts of neck, initial encounter: Secondary | ICD-10-CM | POA: Insufficient documentation

## 2010-08-20 DIAGNOSIS — I10 Essential (primary) hypertension: Secondary | ICD-10-CM | POA: Insufficient documentation

## 2010-08-20 DIAGNOSIS — M25559 Pain in unspecified hip: Secondary | ICD-10-CM | POA: Insufficient documentation

## 2010-08-20 DIAGNOSIS — R51 Headache: Secondary | ICD-10-CM | POA: Insufficient documentation

## 2010-10-30 ENCOUNTER — Emergency Department (HOSPITAL_COMMUNITY): Payer: 59

## 2010-10-30 ENCOUNTER — Inpatient Hospital Stay (HOSPITAL_COMMUNITY)
Admission: EM | Admit: 2010-10-30 | Discharge: 2010-11-01 | DRG: 176 | Disposition: A | Discharge status: Home / Self Care | Payer: 59 | Attending: Internal Medicine | Admitting: Internal Medicine

## 2010-10-30 ENCOUNTER — Inpatient Hospital Stay (INDEPENDENT_AMBULATORY_CARE_PROVIDER_SITE_OTHER)
Admission: RE | Admit: 2010-10-30 | Discharge: 2010-10-30 | Disposition: A | Discharge status: Home / Self Care | Payer: 59 | Source: Ambulatory Visit | Attending: Family Medicine | Admitting: Family Medicine

## 2010-10-30 DIAGNOSIS — R079 Chest pain, unspecified: Secondary | ICD-10-CM

## 2010-10-30 DIAGNOSIS — I4729 Other ventricular tachycardia: Secondary | ICD-10-CM | POA: Diagnosis present

## 2010-10-30 DIAGNOSIS — I359 Nonrheumatic aortic valve disorder, unspecified: Secondary | ICD-10-CM | POA: Diagnosis present

## 2010-10-30 DIAGNOSIS — I472 Ventricular tachycardia, unspecified: Secondary | ICD-10-CM | POA: Diagnosis present

## 2010-10-30 DIAGNOSIS — E785 Hyperlipidemia, unspecified: Secondary | ICD-10-CM | POA: Diagnosis present

## 2010-10-30 DIAGNOSIS — N182 Chronic kidney disease, stage 2 (mild): Secondary | ICD-10-CM | POA: Diagnosis present

## 2010-10-30 DIAGNOSIS — I059 Rheumatic mitral valve disease, unspecified: Secondary | ICD-10-CM | POA: Diagnosis present

## 2010-10-30 DIAGNOSIS — I129 Hypertensive chronic kidney disease with stage 1 through stage 4 chronic kidney disease, or unspecified chronic kidney disease: Secondary | ICD-10-CM | POA: Diagnosis present

## 2010-10-30 DIAGNOSIS — M1990 Unspecified osteoarthritis, unspecified site: Secondary | ICD-10-CM | POA: Diagnosis present

## 2010-10-30 DIAGNOSIS — I2510 Atherosclerotic heart disease of native coronary artery without angina pectoris: Secondary | ICD-10-CM | POA: Diagnosis present

## 2010-10-30 DIAGNOSIS — I2699 Other pulmonary embolism without acute cor pulmonale: Principal | ICD-10-CM | POA: Diagnosis present

## 2010-10-30 LAB — DIFFERENTIAL
Basophils Relative: 1 % (ref 0–1)
Eosinophils Absolute: 0.2 10*3/uL (ref 0.0–0.7)
Eosinophils Relative: 4 % (ref 0–5)
Monocytes Absolute: 0.3 10*3/uL (ref 0.1–1.0)
Monocytes Relative: 6 % (ref 3–12)
Neutrophils Relative %: 48 % (ref 43–77)

## 2010-10-30 LAB — BASIC METABOLIC PANEL
Calcium: 9.3 mg/dL (ref 8.4–10.5)
Creatinine, Ser: 1.19 mg/dL (ref 0.50–1.35)
GFR calc Af Amer: >60 mL/min (ref >60)
GFR calc non Af Amer: >60 mL/min (ref >60)
Sodium: 137 mEq/L (ref 135–145)

## 2010-10-30 LAB — CK TOTAL AND CKMB (NOT AT ARMC)
CK, MB: 4.7 ng/mL — ABNORMAL HIGH (ref 0.3–4.0)
Total CK: 418 U/L — ABNORMAL HIGH (ref 7–232)

## 2010-10-30 LAB — CBC
MCH: 30.8 pg (ref 26.0–34.0)
MCHC: 35.4 g/dL (ref 30.0–36.0)
MCV: 86.9 fL (ref 78.0–100.0)
Platelets: 217 10*3/uL (ref 150–400)
RBC: 4.35 MIL/uL (ref 4.22–5.81)
RDW: 13 % (ref 11.5–15.5)

## 2010-10-31 DIAGNOSIS — I059 Rheumatic mitral valve disease, unspecified: Secondary | ICD-10-CM

## 2010-10-31 LAB — COMPREHENSIVE METABOLIC PANEL
ALT: 16 U/L (ref 0–53)
AST: 19 U/L (ref 0–37)
Albumin: 4 g/dL (ref 3.5–5.2)
CO2: 27 mEq/L (ref 19–32)
Calcium: 9.3 mg/dL (ref 8.4–10.5)
Chloride: 105 mEq/L (ref 96–112)
Creatinine, Ser: 1.24 mg/dL (ref 0.50–1.35)
GFR calc non Af Amer: 59 mL/min — ABNORMAL LOW (ref >60)
Sodium: 141 mEq/L (ref 135–145)

## 2010-10-31 LAB — CARDIAC PANEL(CRET KIN+CKTOT+MB+TROPI)
Relative Index: 1.3 (ref 0.0–2.5)
Relative Index: 1.3 (ref 0.0–2.5)
Total CK: 348 U/L — ABNORMAL HIGH (ref 7–232)
Troponin I: <0.3 ng/mL (ref <0.30)
Troponin I: <0.3 ng/mL (ref <0.30)

## 2010-10-31 LAB — MAGNESIUM: Magnesium: 2.1 mg/dL (ref 1.5–2.5)

## 2010-10-31 LAB — CBC
Hemoglobin: 13.7 g/dL (ref 13.0–17.0)
MCH: 30.8 pg (ref 26.0–34.0)
MCV: 87.2 fL (ref 78.0–100.0)
RBC: 4.45 MIL/uL (ref 4.22–5.81)
WBC: 4.4 10*3/uL (ref 4.0–10.5)

## 2010-10-31 LAB — PROTIME-INR: INR: 1.11 (ref 0.00–1.49)

## 2010-10-31 MED ORDER — IOHEXOL 300 MG/ML  SOLN
100.0000 mL | Freq: Once | INTRAMUSCULAR | Status: AC | PRN
  Administered 2010-10-31: 100 mL via INTRAVENOUS

## 2010-11-01 LAB — BASIC METABOLIC PANEL
BUN: 16 mg/dL (ref 6–23)
CO2: 28 mEq/L (ref 19–32)
Calcium: 9.3 mg/dL (ref 8.4–10.5)
Chloride: 104 mEq/L (ref 96–112)
Creatinine, Ser: 1.28 mg/dL (ref 0.50–1.35)
Glucose, Bld: 110 mg/dL — ABNORMAL HIGH (ref 70–99)

## 2010-11-01 LAB — PROTIME-INR: INR: 1.22 (ref 0.00–1.49)

## 2010-11-02 ENCOUNTER — Encounter: Payer: Self-pay | Admitting: Cardiovascular Disease

## 2010-11-04 ENCOUNTER — Encounter: Payer: No Typology Code available for payment source | Admitting: *Deleted

## 2010-11-04 NOTE — Discharge Summary (Signed)
[name], [name] NO.:  [id number]  MEDICAL RECORD NO.:  [id number]  LOCATION:  3715                         FACILITY:  [facility]  PHYSICIAN:  Marcus Scott, MD     DATE OF BIRTH:  Aug 09, 1945  DATE OF ADMISSION:  10/30/2010 DATE OF DISCHARGE:  11/01/2010                              DISCHARGE SUMMARY   PRIMARY CARE PHYSICIAN:  [name], D.O.  CARDIOLOGIST:  Marcus Carrow, MD  DISCHARGE DIAGNOSES: 1. Right-sided pulmonary embolism/recurrent. 2. Hypertension. 3. Nonobstructive coronary artery disease. 4. Hyperlipidemia. 5. Four-beat nonsustained ventricular tachycardia x1 and occasional     PVCs. 6. Osteoarthritis.  DISCHARGE MEDICATIONS: 1. Lovenox 150 mg subcutaneously at bedtime. 2. Warfarin 10 mg p.o. q.p.m. 3. Amlodipine/benazepril 5/10 mg, 1 tablet p.o. daily. 4. Aspirin 81 mg p.o. daily. 5. Claritin 10 mg p.o. daily p.r.n. allergies. 6. Fenofibrate 134 mg p.o. daily. 7. Mucinex 600 mg p.o. q.12 hourly p.r.n. for congestion. 8. Tylenol Extra Strength 500 mg p.o. q.6 hourly p.r.n. for pain.  IMAGING: 1. CT angiogram of the chest with contrast on October 31, 2010;     impression,     a.     Study is positive for pulmonary embolus with clot as best      seen on the right.  The patient had embolus in this location on      the prior study with the appearances not typical of chronic      embolus.     b.     Cardiomegaly. 2. Chest x-ray on October 30, 2010; impression, no evidence of active     pulmonary disease. 3. 2D echocardiogram on October 31, 2010.  Conclusions, left ventricle     mildly dilated.  Mild concentric hypertrophy.  Left ventricle     ejection fraction of 60% to 65%.  Wall motion was normal.  No     regional wall motion abnormalities.  Trivial aortic valve     regurgitation and moderate mitral valve regurgitation.  Right     atrium mildly dilated.  LABORATORY DATA:  Basic metabolic panel today is significant for  glucose 110, BUN 16, creatinine 1.28, INR is 1.22, TSH is 1.991, magnesium 2.1. Cardiac enzymes were cycled and not suggestive of acute coronary syndrome.  The patient had mildly elevated CK in the 350 range and CK-MB in the 4.5 range, but negative troponins.  Hepatic panel within normal limits.  CBC, hemoglobin 13.7, hematocrit 39, white blood cell 4.4, platelets 213, D-dimer 1.51.  CONSULTATIONS:  None.  DIET:  Heart healthy diet.  ACTIVITIES:  Ad lib.  His today's complaints.  Occasional twinges of pain in the left upper anterior chest on movement of chest wall only.  No difficulty breathing. No bleeding.  PHYSICAL EXAMINATION:  GENERAL:  The patient is in no obvious distress. Telemetry shows sinus bradycardia in the 50s to sinus rhythm in the 80s. Occasional PVCs. VITAL SIGNS:  Temperature 97.7 degrees Fahrenheit, pulse 58 per minute and regular, respirations 18 per minute, blood pressure 125/69 mmHg, and saturating at 98% on room air. RESPIRATORY:  Clear.  No pleural drop.  No increased work of breathing. CARDIOVASCULAR:  First and  second heart sounds heard and regular.  No JVD or murmur. ABDOMEN:  Nondistended, soft, and bowel sounds present. CENTRAL NERVOUS SYSTEM:  The patient is awake, alert, oriented x3 with no focal neurological deficits.  HOSPITAL COURSE:  [name] is a pleasant 65 year old African American male patient     with history of previous pulmonary embolism in 2002 when he     completed a 12-month course of anticoagulation with Coumadin.  He     also has history of hypertension, hyperlipidemia, nonobstructive     coronary artery disease by cath in 2009.  He presented with 2 days     history of dyspnea and chest pain.  He went to an Urgent Care     Center from where he was sent to the ED where CT angiogram of the     chest was revealing for right-sided pulmonary embolism and the     triad hospitalists were requested to admit for further evaluation     and  management. 1. Right-sided acute pulmonary embolism.  Obviously this is a second     episode making it recurrent.  The patient was started on Lovenox     bridging and Coumadin.  Today is day 2 of 5 days Lovenox bridging.     He had mild left-sided pleuritic type of chest pain yesterday,     which has continued to improve.  He has been re-educated regarding     administration of his Lovenox.  After completing the 5-day Lovenox     and Coumadin bridging, he is to continue Lovenox for an additional     24 hours after the INR is therapeutic at greater than or equal to     2.  His Coumadin dosing will be adjusted through the [facility].  This dictator has called the [facility]     team who will call him tomorrow with not only the followup     appointments to the Coumadin clinic, but also followup appointments     with his primary cardiologist.  At some point in the next couple of     months, recommend outpatient consultation with Hematology to     evaluate for prothrombotic state as a cause for his recurrent     pulmonary embolism.  The patient also indicates that he does a lot     of the driving as a part of his job and that might have predisposed     him to this recurrent pulmonary embolism. 3. Hypertension.  Reasonably controlled on home medications. 4. An episode of four-beat nonsustained ventricular tachycardia.  The     patient's left ventricular systolic function is normal.  His recent     cath in 2009 also showed nonobstructive coronary artery disease.     No further interventions for this at this time. 5. Nonobstructive coronary artery disease.  Continue his low-dose     aspirin. 6. Hyperlipidemia.  Continue his TriCor. 7. Possible stage II chronic kidney disease.  Possibly at baseline.  DISPOSITION:  The patient is discharged home in stable condition.  FOLLOWUP RECOMMENDATIONS: 1. With the [facility] on November 03, 2010 for repeat CBC,      PT, and INR. 2. With Dr. Marisue Griffin.  The patient is to call for an appointment. 3. With Dr. Verne Griffin.  The MD's office will call for an     appointment to be seen in approximately 2 weeks.  Time taken in  coordinating this discharge is 30 minutes.     Marcus Scott, MD     AH/MEDQ  D:  11/01/2010  T:  11/01/2010  Job:  [id number]  cc:   Marcus Carrow, MD [name], D.O.  Electronically Signed by Marcus Scott MD on 11/04/2010 11:44:24 PM

## 2010-11-05 ENCOUNTER — Encounter: Payer: Self-pay | Admitting: Cardiovascular Disease

## 2010-11-05 ENCOUNTER — Ambulatory Visit (INDEPENDENT_AMBULATORY_CARE_PROVIDER_SITE_OTHER): Payer: No Typology Code available for payment source | Admitting: *Deleted

## 2010-11-05 DIAGNOSIS — Z7901 Long term (current) use of anticoagulants: Secondary | ICD-10-CM | POA: Insufficient documentation

## 2010-11-05 DIAGNOSIS — I2699 Other pulmonary embolism without acute cor pulmonale: Secondary | ICD-10-CM

## 2010-11-05 LAB — POCT INR: INR: 2

## 2010-11-06 ENCOUNTER — Ambulatory Visit: Payer: No Typology Code available for payment source | Admitting: Cardiovascular Disease

## 2010-11-10 ENCOUNTER — Ambulatory Visit (INDEPENDENT_AMBULATORY_CARE_PROVIDER_SITE_OTHER): Payer: 59 | Admitting: *Deleted

## 2010-11-10 DIAGNOSIS — I2699 Other pulmonary embolism without acute cor pulmonale: Secondary | ICD-10-CM

## 2010-11-10 LAB — POCT INR: INR: 2.6

## 2010-11-10 MED ORDER — WARFARIN SODIUM 5 MG PO TABS: ORAL_TABLET | ORAL | Status: DC

## 2010-11-12 ENCOUNTER — Encounter: Payer: No Typology Code available for payment source | Admitting: *Deleted

## 2010-11-18 ENCOUNTER — Ambulatory Visit: Payer: No Typology Code available for payment source | Admitting: Cardiovascular Disease

## 2010-11-24 ENCOUNTER — Ambulatory Visit (INDEPENDENT_AMBULATORY_CARE_PROVIDER_SITE_OTHER): Payer: 59 | Admitting: *Deleted

## 2010-11-24 DIAGNOSIS — I2699 Other pulmonary embolism without acute cor pulmonale: Secondary | ICD-10-CM

## 2010-11-29 ENCOUNTER — Emergency Department (HOSPITAL_COMMUNITY)
Admission: EM | Admit: 2010-11-29 | Discharge: 2010-11-30 | Disposition: A | Discharge status: Home / Self Care | Payer: 59 | Attending: Emergency Medicine | Admitting: Emergency Medicine

## 2010-11-29 ENCOUNTER — Emergency Department (HOSPITAL_COMMUNITY): Payer: 59

## 2010-11-29 DIAGNOSIS — I10 Essential (primary) hypertension: Secondary | ICD-10-CM | POA: Insufficient documentation

## 2010-11-29 DIAGNOSIS — R05 Cough: Secondary | ICD-10-CM | POA: Insufficient documentation

## 2010-11-29 DIAGNOSIS — R059 Cough, unspecified: Secondary | ICD-10-CM | POA: Insufficient documentation

## 2010-11-29 DIAGNOSIS — Z7901 Long term (current) use of anticoagulants: Secondary | ICD-10-CM | POA: Insufficient documentation

## 2010-11-29 DIAGNOSIS — Z86718 Personal history of other venous thrombosis and embolism: Secondary | ICD-10-CM | POA: Insufficient documentation

## 2010-11-29 DIAGNOSIS — E785 Hyperlipidemia, unspecified: Secondary | ICD-10-CM | POA: Insufficient documentation

## 2010-11-29 DIAGNOSIS — R071 Chest pain on breathing: Secondary | ICD-10-CM | POA: Insufficient documentation

## 2010-11-29 DIAGNOSIS — Z79899 Other long term (current) drug therapy: Secondary | ICD-10-CM | POA: Insufficient documentation

## 2010-11-29 LAB — CBC
HCT: 36.9 % — ABNORMAL LOW (ref 39.0–52.0)
MCHC: 35.5 g/dL (ref 30.0–36.0)
MCV: 86.2 fL (ref 78.0–100.0)
Platelets: 241 10*3/uL (ref 150–400)
RDW: 13.2 % (ref 11.5–15.5)
WBC: 5.2 10*3/uL (ref 4.0–10.5)

## 2010-11-29 LAB — DIFFERENTIAL
Basophils Relative: 1 % (ref 0–1)
Eosinophils Absolute: 0.2 10*3/uL (ref 0.0–0.7)
Eosinophils Relative: 4 % (ref 0–5)
Monocytes Absolute: 0.3 10*3/uL (ref 0.1–1.0)
Monocytes Relative: 6 % (ref 3–12)

## 2010-11-29 LAB — CK TOTAL AND CKMB (NOT AT ARMC)
CK, MB: 6.3 ng/mL (ref 0.3–4.0)
Total CK: 559 U/L — ABNORMAL HIGH (ref 7–232)

## 2010-11-29 LAB — COMPREHENSIVE METABOLIC PANEL
Alkaline Phosphatase: 48 U/L (ref 39–117)
BUN: 21 mg/dL (ref 6–23)
Chloride: 103 mEq/L (ref 96–112)
Creatinine, Ser: 1.45 mg/dL — ABNORMAL HIGH (ref 0.50–1.35)
GFR calc Af Amer: 59 mL/min — ABNORMAL LOW (ref >60)
GFR calc non Af Amer: 49 mL/min — ABNORMAL LOW (ref >60)
Glucose, Bld: 112 mg/dL — ABNORMAL HIGH (ref 70–99)
Potassium: 3.4 mEq/L — ABNORMAL LOW (ref 3.5–5.1)
Total Bilirubin: 0.5 mg/dL (ref 0.3–1.2)

## 2010-12-02 ENCOUNTER — Encounter: Payer: Self-pay | Admitting: Cardiovascular Disease

## 2010-12-02 ENCOUNTER — Encounter: Payer: Self-pay | Admitting: *Deleted

## 2010-12-03 ENCOUNTER — Encounter: Payer: Self-pay | Admitting: Cardiovascular Disease

## 2010-12-03 ENCOUNTER — Ambulatory Visit (INDEPENDENT_AMBULATORY_CARE_PROVIDER_SITE_OTHER): Payer: 59 | Admitting: *Deleted

## 2010-12-03 ENCOUNTER — Ambulatory Visit (INDEPENDENT_AMBULATORY_CARE_PROVIDER_SITE_OTHER): Payer: 59 | Admitting: Cardiovascular Disease

## 2010-12-03 VITALS — BP 126/77 | HR 77 | Ht 72.0 in | Wt 221.0 lb

## 2010-12-03 DIAGNOSIS — I2510 Atherosclerotic heart disease of native coronary artery without angina pectoris: Secondary | ICD-10-CM

## 2010-12-03 DIAGNOSIS — I2699 Other pulmonary embolism without acute cor pulmonale: Secondary | ICD-10-CM

## 2010-12-03 DIAGNOSIS — I059 Rheumatic mitral valve disease, unspecified: Secondary | ICD-10-CM

## 2010-12-03 NOTE — Assessment & Plan Note (Signed)
Stable. Chest pain seems non-cardiac. No changes.

## 2010-12-03 NOTE — Patient Instructions (Signed)
Your physician wants you to follow-up in: 6 months  You will receive a reminder letter in the mail two months in advance. If you don't receive a letter, please call our office to schedule the follow-up appointment.  Your physician recommends that you continue on your current medications as directed. Please refer to the Current Medication list given to you today.  

## 2010-12-03 NOTE — Progress Notes (Signed)
History of Present Illness:65 yo AAM with h/o HTN, hyperlipidemia, non-obstructive CAD by cath 8/09 and mild LV dysfunction who had a hip replacement 9/09 and presented with SOB 3/10 and was found to have bilateral pulmonary emboli.  Repeat CT chest 11/14/08 with no evidence of PE. Lower extremity venous dopplers 9/10 with no evidence of DVT. Coumadin therapy was stopped 9/10. Echo  11/28/08 with normal LV size and systolic function with grade 2 diastolic dysfunction and mild to moderate MR, trivial AI. Readmitted to [facility] October 31, 2010 with SOB and found to have recurrent Pulmonary Embolism by CTA. He was restarted on coumadin in the hospital. He is here today for hospital follow up. We did not get asked to see him in the hospital. Echo 10/31/10 with normal LV size and function, moderate MR.   He is here today for f/u. He did go back to the ED four days ago with cough and chest pain. Was felt to be bronchitis. Described as a muscle ache. Lasted all day. That has not recurred. No exertional chest pain.     Past Medical History  Diagnosis Date  . Hyperlipidemia   . Hypertension   . Seasonal allergies   . Osteoarthritis   . Pulmonary embolism     march 2010  . Coronary artery disease     non-obstructive by cath 2009    Past Surgical History  Procedure Date  . Achillis tendon     repair 20 years ago  . Hip arthroplasty     11/06/07    Current Outpatient Prescriptions  Medication Sig Dispense Refill  . acetaminophen (TYLENOL) 500 MG tablet Take 500 mg by mouth every 6 (six) hours as needed.        Marland Kitchen amLODipine-benazepril (LOTREL) 5-10 MG per capsule Take 1 capsule by mouth daily.        Marland Kitchen aspirin 81 MG tablet Take 81 mg by mouth daily.        . cetirizine (ZYRTEC) 10 MG tablet Take 10 mg by mouth as needed.        . Cholecalciferol (VITAMIN D-3) 5000 UNITS TABS Take by mouth daily.        Marland Kitchen ezetimibe (ZETIA) 10 MG tablet Take 10 mg by mouth daily.        . fenofibrate micronized  (LOFIBRA) 134 MG capsule Take 134 mg by mouth daily before breakfast.        . guaiFENesin (MUCINEX) 600 MG 12 hr tablet Take 600 mg by mouth as needed.        . loratadine (CLARITIN) 10 MG tablet Take 10 mg by mouth daily.        Marland Kitchen warfarin (COUMADIN) 5 MG tablet Take as directed by Anticoagulation clinic   60 tablet  3    Allergies  Allergen Reactions  . Statins     REACTION: muscle aches    History   Social History  . Marital Status: Married    Spouse Name: N/A    Number of Children: N/A  . Years of Education: N/A   Occupational History  . dental hygeinist    Social History Main Topics  . Smoking status: Former Games developer  . Smokeless tobacco: Not on file  . Alcohol Use: No  . Drug Use: No  . Sexually Active:    Other Topics Concern  . Not on file   Social History Narrative  . No narrative on file    Family History  Problem Relation Age of  Onset  . Coronary artery disease    . Heart attack Father   . Heart attack Brother 60    Review of Systems:  As stated in the HPI and otherwise negative.   BP 126/77  Pulse 77  Ht 6' (1.829 m)  Wt 221 lb (100.245 kg)  BMI 29.97 kg/m2  Physical Examination: General: Well developed, well nourished, NAD HEENT: OP clear, mucus membranes moist SKIN: warm, dry. No rashes. Neuro: No focal deficits Musculoskeletal: Muscle strength 5/5 all ext Psychiatric: Mood and affect normal Neck: No JVD, no carotid bruits, no thyromegaly, no lymphadenopathy. Lungs:Clear bilaterally, no wheezes, rhonci, crackles Cardiovascular: Regular rate and rhythm. No murmurs, gallops or rubs. Abdomen:Soft. Bowel sounds present. Non-tender.  Extremities: No lower extremity edema. Pulses are 2 + in the bilateral DP/PT.

## 2010-12-03 NOTE — Assessment & Plan Note (Signed)
Moderate MR by echo. Repeat echo one year.

## 2010-12-17 ENCOUNTER — Ambulatory Visit (INDEPENDENT_AMBULATORY_CARE_PROVIDER_SITE_OTHER): Payer: 59 | Admitting: *Deleted

## 2010-12-17 DIAGNOSIS — I2699 Other pulmonary embolism without acute cor pulmonale: Secondary | ICD-10-CM

## 2010-12-17 LAB — POCT INR: INR: 2.6

## 2010-12-18 LAB — BASIC METABOLIC PANEL
BUN: 16
Chloride: 106
GFR calc Af Amer: >60
Potassium: 3.5

## 2010-12-18 LAB — URINALYSIS, ROUTINE W REFLEX MICROSCOPIC
Glucose, UA: NEGATIVE
Hgb urine dipstick: NEGATIVE
Ketones, ur: NEGATIVE
pH: 5

## 2010-12-18 LAB — DIFFERENTIAL
Basophils Absolute: 0
Eosinophils Absolute: 0.1
Eosinophils Relative: 4
Lymphocytes Relative: 39
Monocytes Absolute: 0.2

## 2010-12-18 LAB — CBC
HCT: 42.6
Hemoglobin: 14.5
MCHC: 34
MCV: 90.9
RDW: 13.8

## 2010-12-21 LAB — BASIC METABOLIC PANEL
CO2: 27
Chloride: 106
GFR calc Af Amer: >60
Sodium: 141

## 2010-12-21 LAB — DIFFERENTIAL
Basophils Relative: 1
Eosinophils Absolute: 0.1
Monocytes Absolute: 0.2
Monocytes Relative: 6

## 2010-12-21 LAB — CBC
Hemoglobin: 14.1
MCHC: 33.9
MCV: 90.7
RBC: 4.59

## 2010-12-21 LAB — TYPE AND SCREEN: Antibody Screen: NEGATIVE

## 2010-12-21 LAB — ABO/RH: ABO/RH(D): A POS

## 2010-12-21 LAB — URINALYSIS, ROUTINE W REFLEX MICROSCOPIC
Bilirubin Urine: NEGATIVE
Hgb urine dipstick: NEGATIVE
Nitrite: NEGATIVE
Specific Gravity, Urine: 1.019
pH: 5

## 2010-12-22 LAB — BASIC METABOLIC PANEL
BUN: 13
CO2: 30
Calcium: 8.4
Calcium: 8.5
Creatinine, Ser: 1.36
GFR calc Af Amer: >60
GFR calc non Af Amer: 58 — ABNORMAL LOW
Glucose, Bld: 125 — ABNORMAL HIGH
Glucose, Bld: 132 — ABNORMAL HIGH
Potassium: 3.7
Sodium: 139

## 2010-12-22 LAB — CBC
HCT: 34.3 — ABNORMAL LOW
Hemoglobin: 11.6 — ABNORMAL LOW
MCHC: 34.8
Platelets: 179
RBC: 3.54 — ABNORMAL LOW
RDW: 13.7
WBC: 7.2

## 2011-01-02 NOTE — H&P (Signed)
[name], [name]                 ACCOUNT NO.:  [id number]  MEDICAL RECORD NO.:  [id number]  LOCATION:  [facility]                         FACILITY:  [facility]  PHYSICIAN:  Lonia Blood, M.D.      DATE OF BIRTH:  1945/09/19  DATE OF ADMISSION:  10/30/2010 DATE OF DISCHARGE:                             HISTORY & PHYSICAL   PRIMARY CARE PHYSICIAN:  Lovenia Kim, DO  PRESENTING COMPLAINT:  Chest pain and shortness of breath.  HISTORY OF PRESENT ILLNESS:  The patient is a 65 year old gentleman with known history of hypertension, hyperlipidemia, and history of pulmonary embolism back in 2010.  He was on Coumadin back then for 6 months after which it was stopped.  Thereafter, the patient has been doing okay until the last 2 days when he started having shortness of breath associated with some chest pain.  He felt bad and went to the Urgent Care Center from where he was sent over for further treatment.  The patient seen here and found to have another pulmonary embolus, hence, he is being admitted for further management.  He denied any radiation of his chest pain.  The pain was 4/10, centrally located.  No fever.  No nausea, vomiting or diarrhea.  He denied any hypoxia.  PAST MEDICAL HISTORY:  Significant for: 1. Hypertension. 2. Hyperlipidemia. 3. Coronary artery disease which is nonobstructive.  His last cath was     in November 17, 2007. 4. He had bilateral pulmonary embolus in May 2010. 5. History of osteoarthritis of the left hip with replacement.  ALLERGIES:  ZOCOR.  CURRENT MEDICATIONS:  Amlodipine, benazepril, aspirin, fenofibrate micronized and fish oil.  SOCIAL HISTORY:  The patient is married, lives here in [place] with his wife.  He drives a lot and was actually in [place] as well as [place] this past week, has driven up at least 4 hours to [place] and 2 hours to [place].  He is a part-time contracture.  He chews tobacco.  Denied any alcohol or IV drug use.   Denied any herbal medications.  FAMILY HISTORY:  His mother had coronary artery disease in late 28s. Father died at age of 48 from MI.  His siblings died at age of 49, one of which was from MI, and another from heart failure.  REVIEW OF SYSTEMS:  All systems reviewed are currently negative except per HPI.  PHYSICAL EXAMINATION:  VITAL SIGNS:  His temperature is 98.4, blood pressure 127/81, pulse 64, respiratory rate 18, sats 100% on 2 liters. GENERAL:  He is awake, alert, oriented, pleasant man who is in no acute distress. HEENT:  PERRL.  EOMI.  No pallor, no jaundice.  No rhinorrhea. NECK:  Supple.  No JVD, no lymphadenopathy. RESPIRATORY:  He has good air entry bilaterally.  No significant wheezing or rales.  No crackles. CARDIOVASCULAR:  He has S1-S2.  No murmur. ABDOMEN:  Soft, full, nontender with positive bowel sounds. EXTREMITIES:  No edema, cyanosis or clubbing. SKIN:  No rashes.  No ulcers. MUSCULOSKELETAL:  No joint swelling or tenderness. NEUROLOGIC:  Cranial nerves II-XII seem to be intact.  No focal neurologic abnormalities.  LABORATORIES:  His D-dimer is 1.51, troponin  less than 0.3.  CK of 418 with MB of 4.7 with index of 1.1.  Sodium is 137, potassium 3.5, chloride 102, CO2 27, glucose 108, BUN 18, creatinine 1.19, calcium 9.3. White count 4.8, hemoglobin 13.4, with platelet count of 217.  Chest x- ray showed no evidence of active pulmonary disease.  CT angiogram of the chest showed pulmonary embolus with clot seen on the right.  This is in same location from prior studies.  Also evidence of cardiomegaly.  His EKG showed normal sinus rhythm.  No significant ST-T wave changes.  ASSESSMENT:  This is a 65 year old gentleman who previously had a pulmonary embolus in 2010, now having another pulmonary embolus.  He has been off Coumadin for more than a year now.  It seems like the patient has a predisposition to having clots.  He drove for 4 hours to [place] and  another driving at least to [place] recently.  No recent surgery.  PLAN:  Therefore: 1. Pulmonary embolus.  Being that it is recurrent the patient may need     to be on Coumadin for life.  For now, we will start him on Lovenox     and Coumadin concurrently, re-train him on how to give him some     Lovenox although he did that 3 years ago.  Once that is arranged,     he can go home on Coumadin and Lovenox and have regular followup.     He was followed by [facility] before and they may have to     continue doing that if Dr. Elisabeth Most is going to refer him there. 2. Hypertension.  Continue with his home medications. 3. Hyperlipidemia.  Again, continue with fenofibrate.  Apparently, he     cannot tolerate statin. 4. History of coronary artery disease.  I will check his serial     cardiac enzymes.  I am going to also check a 2-D echo in light with     his new PE and cardiomegaly on CT.     Lonia Blood, M.D.     Verlin Grills  D:  10/31/2010  T:  10/31/2010  Job:  [id number]  Electronically Signed by Lonia Blood M.D. on 01/02/2011 02:51:15 PM

## 2011-01-14 ENCOUNTER — Ambulatory Visit (INDEPENDENT_AMBULATORY_CARE_PROVIDER_SITE_OTHER): Payer: 59 | Admitting: *Deleted

## 2011-01-14 DIAGNOSIS — Z7901 Long term (current) use of anticoagulants: Secondary | ICD-10-CM

## 2011-01-14 DIAGNOSIS — I2699 Other pulmonary embolism without acute cor pulmonale: Secondary | ICD-10-CM

## 2011-01-14 LAB — POCT INR: INR: 2

## 2011-01-22 ENCOUNTER — Telehealth: Payer: Self-pay | Admitting: Cardiovascular Disease

## 2011-01-22 NOTE — Telephone Encounter (Signed)
He is on coumadin. Any type of kick back from a high powered rifle could make him bruise easily. I would only allow him to shoot a gun that does not have a significant kick to it. Thanks, chris

## 2011-01-22 NOTE — Telephone Encounter (Signed)
Spoke with pt and told him I would review with Dr. Clifton James. Pt planning on going hunting on November 10

## 2011-01-22 NOTE — Telephone Encounter (Signed)
Pt has blood clot in lung, wants to know if ok to go hunting and shoot a rifle that would be pressed against his chest?

## 2011-01-22 NOTE — Telephone Encounter (Signed)
Spoke with pt and gave him information from Dr. McAlhany 

## 2011-02-05 ENCOUNTER — Encounter: Payer: Self-pay | Admitting: Cardiovascular Disease

## 2011-02-10 ENCOUNTER — Ambulatory Visit (INDEPENDENT_AMBULATORY_CARE_PROVIDER_SITE_OTHER): Payer: 59 | Admitting: *Deleted

## 2011-02-10 DIAGNOSIS — I2699 Other pulmonary embolism without acute cor pulmonale: Secondary | ICD-10-CM

## 2011-02-10 DIAGNOSIS — Z7901 Long term (current) use of anticoagulants: Secondary | ICD-10-CM

## 2011-02-10 LAB — POCT INR: INR: 2.4

## 2011-03-10 ENCOUNTER — Ambulatory Visit (INDEPENDENT_AMBULATORY_CARE_PROVIDER_SITE_OTHER): Payer: 59 | Admitting: *Deleted

## 2011-03-10 DIAGNOSIS — I2699 Other pulmonary embolism without acute cor pulmonale: Secondary | ICD-10-CM

## 2011-03-10 DIAGNOSIS — Z7901 Long term (current) use of anticoagulants: Secondary | ICD-10-CM

## 2011-04-13 ENCOUNTER — Other Ambulatory Visit: Payer: Self-pay | Admitting: Cardiovascular Disease

## 2011-04-21 ENCOUNTER — Encounter: Payer: 59 | Admitting: *Deleted

## 2011-04-26 ENCOUNTER — Ambulatory Visit (INDEPENDENT_AMBULATORY_CARE_PROVIDER_SITE_OTHER): Payer: 59 | Admitting: *Deleted

## 2011-04-26 DIAGNOSIS — Z7901 Long term (current) use of anticoagulants: Secondary | ICD-10-CM

## 2011-04-26 DIAGNOSIS — I2699 Other pulmonary embolism without acute cor pulmonale: Secondary | ICD-10-CM

## 2011-06-07 ENCOUNTER — Ambulatory Visit (INDEPENDENT_AMBULATORY_CARE_PROVIDER_SITE_OTHER): Payer: 59

## 2011-06-07 DIAGNOSIS — Z7901 Long term (current) use of anticoagulants: Secondary | ICD-10-CM

## 2011-06-07 DIAGNOSIS — I2699 Other pulmonary embolism without acute cor pulmonale: Secondary | ICD-10-CM | POA: Diagnosis not present

## 2011-06-14 ENCOUNTER — Encounter: Payer: Self-pay | Admitting: Cardiovascular Disease

## 2011-06-14 ENCOUNTER — Ambulatory Visit (INDEPENDENT_AMBULATORY_CARE_PROVIDER_SITE_OTHER): Payer: 59 | Admitting: Cardiovascular Disease

## 2011-06-14 VITALS — BP 124/86 | HR 56 | Ht 72.0 in | Wt 217.1 lb

## 2011-06-14 DIAGNOSIS — I2510 Atherosclerotic heart disease of native coronary artery without angina pectoris: Secondary | ICD-10-CM

## 2011-06-14 DIAGNOSIS — R0989 Other specified symptoms and signs involving the circulatory and respiratory systems: Secondary | ICD-10-CM

## 2011-06-14 NOTE — Assessment & Plan Note (Signed)
Will get carotid artery dopplers. 

## 2011-06-14 NOTE — Assessment & Plan Note (Signed)
Moderate MR by echo August 2012. Murmur unchanged.

## 2011-06-14 NOTE — Patient Instructions (Signed)
Your physician wants you to follow-up in: 12 months.   You will receive a reminder letter in the mail two months in advance. If you don't receive a letter, please call our office to schedule the follow-up appointment  Your physician has requested that you have a carotid duplex. This test is an ultrasound of the carotid arteries in your neck. It looks at blood flow through these arteries that supply the brain with blood. Allow one hour for this exam. There are no restrictions or special instructions.    

## 2011-06-14 NOTE — Assessment & Plan Note (Signed)
Stable No changes 

## 2011-06-14 NOTE — Assessment & Plan Note (Signed)
He will need coumadin for lifetime. No change in SOB. No need to repeat CTA.

## 2011-06-14 NOTE — Progress Notes (Signed)
History of Present Illness: 66 yo AAM with h/o HTN, hyperlipidemia, non-obstructive CAD by cath 8/09 and mild LV dysfunction who had a hip replacement 9/09 and presented with SOB 3/10 and was found to have bilateral pulmonary emboli. Repeat CT chest 11/14/08 with no evidence of PE. Lower extremity venous dopplers 9/10 with no evidence of DVT. Coumadin therapy was stopped 9/10. Echo 11/28/08 with normal LV size and systolic function with grade 2 diastolic dysfunction and mild to moderate MR, trivial AI. Readmitted to [facility] October 31, 2010 with SOB and found to have recurrent Pulmonary Embolism by CTA. He was restarted on coumadin in the hospital. He has been on coumadin over the last year. Echo 10/31/10 with normal LV size and function, moderate MR.  He is here today for cardiac follow up.  Only c/o left shoulder pain. No change in breathing. No chest pain. No bleeding issues with coumadin.  Primary Care Physician: Oneta Rack  Past Medical History  Diagnosis Date  . Hyperlipidemia   . Hypertension   . Seasonal allergies   . Osteoarthritis   . Pulmonary embolism     march 2010  . Coronary artery disease     non-obstructive by cath 2009    Past Surgical History  Procedure Date  . Achillis tendon     repair 20 years ago  . Hip arthroplasty     11/06/07    Current Outpatient Prescriptions  Medication Sig Dispense Refill  . amLODipine-benazepril (LOTREL) 5-10 MG per capsule Take 1 capsule by mouth daily.        Marland Kitchen aspirin 81 MG tablet Take 81 mg by mouth daily.        . fenofibrate micronized (LOFIBRA) 134 MG capsule Take 134 mg by mouth daily before breakfast.        . fexofenadine (ALLEGRA) 30 MG tablet Take 30 mg by mouth daily.      . Flaxseed, Linseed, (FLAX SEED OIL) 1000 MG CAPS Take 1,000 mg by mouth 2 (two) times daily.      Marland Kitchen guaiFENesin (MUCINEX) 600 MG 12 hr tablet Take 600 mg by mouth as needed.        . naproxen sodium (ANAPROX) 220 MG tablet Take 220 mg by mouth as needed.       . warfarin (COUMADIN) 5 MG tablet TAKE AS DIRECTED BY ANTICOAGULATION CLINIC  60 tablet  3    Allergies  Allergen Reactions  . Statins     REACTION: muscle aches    History   Social History  . Marital Status: Married    Spouse Name: N/A    Number of Children: N/A  . Years of Education: N/A   Occupational History  . dental hygeinist    Social History Main Topics  . Smoking status: Former Games developer  . Smokeless tobacco: Current User  . Alcohol Use: No  . Drug Use: No  . Sexually Active: Not on file   Other Topics Concern  . Not on file   Social History Narrative  . No narrative on file    Family History  Problem Relation Age of Onset  . Coronary artery disease    . Heart attack Father   . Heart attack Brother 60    Review of Systems:  As stated in the HPI and otherwise negative.   BP 124/86  Pulse 56  Ht 6' (1.829 m)  Wt 217 lb 1.9 oz (98.485 kg)  BMI 29.45 kg/m2  Physical Examination: General: Well developed, well  nourished, NAD HEENT: OP clear, mucus membranes moist SKIN: warm, dry. No rashes. Neuro: No focal deficits Musculoskeletal: Muscle strength 5/5 all ext Psychiatric: Mood and affect normal Neck: No JVD, right  carotid bruit, no thyromegaly, no lymphadenopathy. Lungs:Clear bilaterally, no wheezes, rhonci, crackles Cardiovascular: Regular rate and rhythm. No murmurs, gallops or rubs. Abdomen:Soft. Bowel sounds present. Non-tender.  Extremities: No lower extremity edema. Pulses are 2 + in the bilateral DP/PT.  EKG:

## 2011-07-02 ENCOUNTER — Encounter (INDEPENDENT_AMBULATORY_CARE_PROVIDER_SITE_OTHER): Payer: 59

## 2011-07-02 DIAGNOSIS — R0989 Other specified symptoms and signs involving the circulatory and respiratory systems: Secondary | ICD-10-CM | POA: Diagnosis not present

## 2011-07-19 ENCOUNTER — Encounter: Payer: Self-pay | Admitting: Cardiovascular Disease

## 2011-07-19 ENCOUNTER — Ambulatory Visit (INDEPENDENT_AMBULATORY_CARE_PROVIDER_SITE_OTHER): Payer: 59

## 2011-07-19 DIAGNOSIS — I2699 Other pulmonary embolism without acute cor pulmonale: Secondary | ICD-10-CM

## 2011-07-19 DIAGNOSIS — Z7901 Long term (current) use of anticoagulants: Secondary | ICD-10-CM

## 2011-07-20 ENCOUNTER — Telehealth: Payer: Self-pay | Admitting: Cardiovascular Disease

## 2011-07-20 NOTE — Telephone Encounter (Signed)
Per Misty Stanley pt to have dental cleaning. I reviewed with Coumadin clinic and he does not need to hold coumadin for cleaning.  I gave this information to [facility] at Dr. Lubertha Basque office.

## 2011-07-20 NOTE — Telephone Encounter (Signed)
New problem:  Per Misty Stanley, calling from Dr.  Ladona Ridgel office . Please advise on coumadin. appt on 5/7.

## 2011-08-05 ENCOUNTER — Ambulatory Visit (HOSPITAL_COMMUNITY)
Admission: RE | Admit: 2011-08-05 | Discharge: 2011-08-05 | Disposition: A | Discharge status: Home / Self Care | Payer: 59 | Source: Ambulatory Visit | Attending: Orthopedic Surgery | Admitting: Orthopedic Surgery

## 2011-08-05 DIAGNOSIS — Z01818 Encounter for other preprocedural examination: Secondary | ICD-10-CM | POA: Insufficient documentation

## 2011-08-05 DIAGNOSIS — Z01810 Encounter for preprocedural cardiovascular examination: Secondary | ICD-10-CM

## 2011-08-05 DIAGNOSIS — M1610 Unilateral primary osteoarthritis, unspecified hip: Secondary | ICD-10-CM | POA: Insufficient documentation

## 2011-08-05 DIAGNOSIS — M169 Osteoarthritis of hip, unspecified: Secondary | ICD-10-CM

## 2011-08-05 DIAGNOSIS — Z86718 Personal history of other venous thrombosis and embolism: Secondary | ICD-10-CM | POA: Insufficient documentation

## 2011-08-05 NOTE — Progress Notes (Signed)
Bilateral lower extremity venous duplex completed.  Preliminary report is negative for DVT, SVT, or a Baker's cyst. 

## 2011-08-06 ENCOUNTER — Encounter: Payer: Self-pay | Admitting: Cardiovascular Disease

## 2011-08-06 ENCOUNTER — Telehealth: Payer: Self-pay | Admitting: *Deleted

## 2011-08-06 NOTE — Telephone Encounter (Signed)
Dr.McAlhany has written note and completed form. Will fax to Dr. Nilsa Nutting office. Coumadin clinic also aware

## 2011-08-06 NOTE — Telephone Encounter (Signed)
Request for preop clearance received in office from Dr. Charlann Boxer at [facility] Reviewed with Dr. Clifton James and pt can proceed with surgery if he is feeling well from a cardiac standpoint but will need Lovenox bridging.  I called and spoke with pt who reports he is feeling well.  I told him coumadin clinic would be in touch with him and we would send completed form to Dr. Nilsa Nutting office.

## 2011-08-06 NOTE — Telephone Encounter (Signed)
Thanks, chris 

## 2011-08-11 ENCOUNTER — Ambulatory Visit (INDEPENDENT_AMBULATORY_CARE_PROVIDER_SITE_OTHER): Payer: 59 | Admitting: *Deleted

## 2011-08-11 DIAGNOSIS — Z7901 Long term (current) use of anticoagulants: Secondary | ICD-10-CM

## 2011-08-11 DIAGNOSIS — I2699 Other pulmonary embolism without acute cor pulmonale: Secondary | ICD-10-CM

## 2011-08-11 MED ORDER — ENOXAPARIN SODIUM 150 MG/ML ~~LOC~~ SOLN: 150.0000 mg | Freq: Every day | SUBCUTANEOUS | Status: DC

## 2011-08-11 NOTE — Patient Instructions (Signed)
Last day to take coumadin Wednesday 29th  Thursday 30th No coumadin nor Lovenox Friday 31st  Take Lovenox 150 mg daily in am  June 1st  Take Lovenox 150 mg daily in am June 2nd take Lovenox 150 mg daily in am June 3rd take Lovenox 150 mg daily in am June 4th no Lovenox nor Coumadin June 4th   Total right hip  Restart Lovenox and Coumadin when ordered by MD post op Home Health to do INR one week from day of surgery or as directed  Have Home health to call Coumadin Clinic [phone] for order INR checks

## 2011-08-12 ENCOUNTER — Encounter (HOSPITAL_COMMUNITY): Payer: Self-pay | Admitting: Pharmacy Technician

## 2011-08-19 ENCOUNTER — Encounter (HOSPITAL_COMMUNITY)
Admission: RE | Admit: 2011-08-19 | Discharge: 2011-08-19 | Disposition: A | Discharge status: Home / Self Care | Payer: 59 | Source: Ambulatory Visit | Attending: Orthopedic Surgery | Admitting: Orthopedic Surgery

## 2011-08-19 ENCOUNTER — Encounter (HOSPITAL_COMMUNITY): Payer: Self-pay

## 2011-08-19 HISTORY — DX: Prediabetes: R73.03

## 2011-08-19 LAB — PROTIME-INR
INR: 2.2 — ABNORMAL HIGH (ref 0.00–1.49)
Prothrombin Time: 24.8 seconds — ABNORMAL HIGH (ref 11.6–15.2)

## 2011-08-19 LAB — URINALYSIS, ROUTINE W REFLEX MICROSCOPIC
Bilirubin Urine: NEGATIVE
Ketones, ur: NEGATIVE mg/dL
Leukocytes, UA: NEGATIVE
Nitrite: NEGATIVE
Protein, ur: NEGATIVE mg/dL
Urobilinogen, UA: 0.2 mg/dL (ref 0.0–1.0)
pH: 6 (ref 5.0–8.0)

## 2011-08-19 LAB — APTT: aPTT: 37 seconds (ref 24–37)

## 2011-08-19 NOTE — Progress Notes (Signed)
08/19/11 1016  OBSTRUCTIVE SLEEP APNEA  Have you ever been diagnosed with sleep apnea through a sleep study? No  Do you snore loudly (loud enough to be heard through closed doors)?  0  Do you often feel tired, fatigued, or sleepy during the daytime? 1  Has anyone observed you stop breathing during your sleep? 0  Do you have, or are you being treated for high blood pressure? 1  BMI more than 35 kg/m2? 0  Age over 65 years old? 1  Neck circumference greater than 40 cm/18 inches? 0  Gender: 1  Obstructive Sleep Apnea Score 4   Score 4 or greater  Updated health history;Results sent to PCP

## 2011-08-19 NOTE — Patient Instructions (Signed)
20 ADIEL ERNEY  08/19/2011   Your procedure is scheduled on:  08/24/11 Tuesday  Surgery [id number]  Report to [facility] at  1105     AM.  Call this number if you have problems the morning of surgery: [phone]     Or PST   [id number]  [facility]   Remember:               Do not eat food  Or drink any fluids :After Midnight. Monday NIGHT     Take these medicines the morning of surgery with A SIP OF WATER: ALLEGRA   Do not wear jewelry, make-up or nail polish.  Do not wear lotions, powders, or perfumes. You may wear deodorant.  Do not shave 48 hours prior to surgery.  Do not bring valuables to the hospital.  Contacts, dentures or bridgework may not be worn into surgery.  Leave suitcase in the car. After surgery it may be brought to your room.  For patients admitted to the hospital, checkout time is 11:00 AM the day of discharge.   Patients discharged the day of surgery will not be allowed to drive home.  Name and phone number of your driver:    wife                                                                  Special Instructions: CHG Shower Use Special Wash: 1/2 bottle night before surgery and 1/2 bottle morning of surgery. REGULAR SOAP FACE AND PRIVATES                            MEN-MAY SHAVE FACE MORNING OF SURGERY  Please read over the following fact sheets that you were given: MRSA Information

## 2011-08-19 NOTE — H&P (Signed)
[name] is an 66 y.o. male.    Chief Complaint:  Right hip OA and pain   HPI: Pt is a 66 y.o. male complaining of right hip pain for 4+ years.  Previous left THA in 2010 which has been doing well. Pain in the right hip has really increase in the last 2 years.  X-rays in the clinic show end-stage arthritic changes of the right hip. Pt has tried various conservative treatments which have failed to alleviate their symptoms, including PT. Various options are discussed with the patient. Risks, benefits and expectations were discussed with the patient. Patient understand the risks, benefits and expectations and wishes to proceed with surgery.   PCP:  Nadean Corwin, MD, MD  D/C Plans:  Home with HHPT  Post-op Meds:   Rx given for Robaxin, Iron, Colace and MiraLax. Pt is on Coumadin and will need to go back on Coumadin with branching Lovenox.  Tranexamic Acid:   Not to be given - vascular disease  Decadron:   Not to be given - pre-DM  PMH: Past Medical History  Diagnosis Date  . Hyperlipidemia   . Seasonal allergies   . Osteoarthritis   . Pulmonary embolism     march 2010, 2012              /   DVT x  2  LEFT 2010  . Coronary artery disease     non-obstructive by cath 2009  . Heart murmur   . Pre-diabetes   . Hypertension     Clearance with note Dr Marvel Plan on chart,  Lovonox bridging  ordered by Dr Sanjuana Kava    EKG 9/12, chst 9/12 EPIC  . Peripheral vascular disease   . Sleep apnea     STOP BANG SCORE 4    PSH: Past Surgical History  Procedure Date  . Achillis tendon     repair 20 years ago  . Hip arthroplasty     11/06/07    Social History:  reports that he quit smoking about 64 years ago. His smoking use included Cigars. His smokeless tobacco use includes Chew. He reports that he does not drink alcohol or use illicit drugs.  Allergies:  Allergies  Allergen Reactions  . Statins     REACTION: muscle aches    Medications: No current facility-administered  medications for this encounter.   Current Outpatient Prescriptions  Medication Sig Dispense Refill  . aspirin 81 MG tablet Take 81 mg by mouth daily.       . benazepril (LOTENSIN) 20 MG tablet Take 10 mg by mouth every evening. Takes 1/2 tablet to equal 10mg       . Cholecalciferol (VITAMIN D3) 5000 UNITS TABS Take 1 tablet by mouth daily.      Marland Kitchen enoxaparin (LOVENOX) 150 MG/ML injection Inject 150 mg into the skin daily. Take as directed by Coumadin clinic/  Start 08/20/11      . fenofibrate micronized (LOFIBRA) 134 MG capsule Take 134 mg by mouth every evening.       . fexofenadine (ALLEGRA) 30 MG tablet Take 30 mg by mouth daily.      . Flaxseed, Linseed, (FLAX SEED OIL) 1000 MG CAPS Take 1,000 mg by mouth 2 (two) times daily.      Marland Kitchen guaiFENesin (MUCINEX) 600 MG 12 hr tablet Take 600 mg by mouth as needed.       . naproxen sodium (ANAPROX) 220 MG tablet Take 220 mg by mouth as needed.      Marland Kitchen  Vitamin D, Ergocalciferol, (DRISDOL) 50000 UNITS CAPS Take 50,000 Units by mouth every 7 (seven) days.      Marland Kitchen warfarin (COUMADIN) 5 MG tablet Take 5 mg by mouth See admin instructions. 2 tablets everyday except Tuesday Thursday and Saturday only takes 1 tablet on those days      . DISCONTD: enoxaparin (LOVENOX) 150 MG/ML injection Inject 1 mL (150 mg total) into the skin daily. Take as directed by Coumadin clinic  10 Syringe  1  . DISCONTD: loratadine (CLARITIN) 10 MG tablet Take 10 mg by mouth daily.          Results for orders placed during the hospital encounter of 08/19/11 (from the past 48 hour(s))  URINALYSIS, ROUTINE W REFLEX MICROSCOPIC     Status: Normal   Collection Time   08/19/11  9:54 AM      Component Value Range Comment   Color, Urine YELLOW  YELLOW     APPearance CLEAR  CLEAR     Specific Gravity, Urine 1.017  1.005 - 1.030     pH 6.0  5.0 - 8.0     Glucose, UA NEGATIVE  NEGATIVE (mg/dL)    Hgb urine dipstick NEGATIVE  NEGATIVE     Bilirubin Urine NEGATIVE  NEGATIVE     Ketones, ur  NEGATIVE  NEGATIVE (mg/dL)    Protein, ur NEGATIVE  NEGATIVE (mg/dL)    Urobilinogen, UA 0.2  0.0 - 1.0 (mg/dL)    Nitrite NEGATIVE  NEGATIVE     Leukocytes, UA NEGATIVE  NEGATIVE  MICROSCOPIC NOT DONE ON URINES WITH NEGATIVE PROTEIN, BLOOD, LEUKOCYTES, NITRITE, OR GLUCOSE <1000 mg/dL.  SURGICAL PCR SCREEN     Status: Normal   Collection Time   08/19/11  9:54 AM      Component Value Range Comment   MRSA, PCR NEGATIVE  NEGATIVE     Staphylococcus aureus NEGATIVE  NEGATIVE    APTT     Status: Normal   Collection Time   08/19/11 10:40 AM      Component Value Range Comment   aPTT 37  24 - 37 (seconds)   PROTIME-INR     Status: Abnormal   Collection Time   08/19/11 10:40 AM      Component Value Range Comment   Prothrombin Time 24.8 (*) 11.6 - 15.2 (seconds)    INR 2.20 (*) 0.00 - 1.49      ROS: Review of Systems  Constitutional: Negative.   HENT: Negative.   Eyes: Negative.   Respiratory: Negative.   Cardiovascular: Negative.   Gastrointestinal: Negative.   Genitourinary: Negative.   Musculoskeletal: Positive for back pain and joint pain.  Skin: Negative.   Neurological: Negative.   Endo/Heme/Allergies: Positive for environmental allergies.  Psychiatric/Behavioral: Negative.      Physical Exam: BP: 138/68 ; HR: 80 ; Resp: 18; Physical Exam  Constitutional: He is oriented to person, place, and time and well-developed, well-nourished, and in no distress.  HENT:  Head: Normocephalic and atraumatic.  Nose: Nose normal.  Mouth/Throat: Oropharynx is clear and moist.  Eyes: Pupils are equal, round, and reactive to light.  Neck: Neck supple. No JVD present. No tracheal deviation present. No thyromegaly present.  Cardiovascular: Normal rate, regular rhythm and intact distal pulses.   Pulmonary/Chest: Effort normal and breath sounds normal. No respiratory distress. He has no wheezes. He exhibits no tenderness.  Abdominal: Soft. There is no tenderness. There is no guarding.    Musculoskeletal:       Right hip:  He exhibits decreased range of motion, decreased strength, tenderness, bony tenderness and crepitus. He exhibits no swelling, no deformity and no laceration.  Lymphadenopathy:    He has no cervical adenopathy.  Neurological: He is alert and oriented to person, place, and time.  Skin: Skin is warm and dry.  Psychiatric: Affect normal.     Assessment/Plan Assessment:  Right hip OA and pain   Plan: Patient will undergo a right total hip arthroplasty, anterior approach on 08/24/2011. Risks benefits and expectation were discussed with the patient. Patient understand risks, benefits and expectation and wishes to proceed.   Anastasio Auerbach Karon Heckendorn   PAC  08/19/2011, 2:48 PM

## 2011-08-19 NOTE — Pre-Procedure Instructions (Signed)
CBC, BMET, LIVER PANEL FROM 08/10/11 ON CHART.

## 2011-08-24 ENCOUNTER — Encounter (HOSPITAL_COMMUNITY): Payer: Self-pay | Admitting: Anesthesiology

## 2011-08-24 ENCOUNTER — Encounter (HOSPITAL_COMMUNITY): Payer: Self-pay | Admitting: *Deleted

## 2011-08-24 ENCOUNTER — Ambulatory Visit (HOSPITAL_COMMUNITY): Payer: 59

## 2011-08-24 ENCOUNTER — Ambulatory Visit (HOSPITAL_COMMUNITY): Payer: 59 | Admitting: Anesthesiology

## 2011-08-24 ENCOUNTER — Encounter (HOSPITAL_COMMUNITY)
Admission: RE | Disposition: A | Discharge status: Home Health | Payer: Self-pay | Source: Ambulatory Visit | Attending: Orthopedic Surgery

## 2011-08-24 ENCOUNTER — Inpatient Hospital Stay (HOSPITAL_COMMUNITY)
Admission: RE | Admit: 2011-08-24 | Discharge: 2011-08-26 | DRG: 470 | Disposition: A | Discharge status: Home Health | Payer: 59 | Source: Ambulatory Visit | Attending: Orthopedic Surgery | Admitting: Orthopedic Surgery

## 2011-08-24 DIAGNOSIS — Z471 Aftercare following joint replacement surgery: Secondary | ICD-10-CM | POA: Diagnosis not present

## 2011-08-24 DIAGNOSIS — Z01812 Encounter for preprocedural laboratory examination: Secondary | ICD-10-CM | POA: Diagnosis not present

## 2011-08-24 DIAGNOSIS — I10 Essential (primary) hypertension: Secondary | ICD-10-CM | POA: Diagnosis present

## 2011-08-24 DIAGNOSIS — I2510 Atherosclerotic heart disease of native coronary artery without angina pectoris: Secondary | ICD-10-CM | POA: Diagnosis present

## 2011-08-24 DIAGNOSIS — E785 Hyperlipidemia, unspecified: Secondary | ICD-10-CM | POA: Diagnosis present

## 2011-08-24 DIAGNOSIS — G4730 Sleep apnea, unspecified: Secondary | ICD-10-CM | POA: Diagnosis present

## 2011-08-24 DIAGNOSIS — Z86711 Personal history of pulmonary embolism: Secondary | ICD-10-CM | POA: Diagnosis not present

## 2011-08-24 DIAGNOSIS — I739 Peripheral vascular disease, unspecified: Secondary | ICD-10-CM | POA: Diagnosis present

## 2011-08-24 DIAGNOSIS — Z96649 Presence of unspecified artificial hip joint: Secondary | ICD-10-CM | POA: Diagnosis not present

## 2011-08-24 DIAGNOSIS — M1610 Unilateral primary osteoarthritis, unspecified hip: Principal | ICD-10-CM | POA: Diagnosis present

## 2011-08-24 DIAGNOSIS — M169 Osteoarthritis of hip, unspecified: Secondary | ICD-10-CM | POA: Diagnosis not present

## 2011-08-24 HISTORY — PX: TOTAL HIP ARTHROPLASTY: SHX124

## 2011-08-24 LAB — PROTIME-INR
INR: 1.18 (ref 0.00–1.49)
Prothrombin Time: 15.3 seconds — ABNORMAL HIGH (ref 11.6–15.2)

## 2011-08-24 LAB — TYPE AND SCREEN: ABO/RH(D): A POS

## 2011-08-24 SURGERY — ARTHROPLASTY, HIP, TOTAL, ANTERIOR APPROACH
Anesthesia: General | Site: Hip | Laterality: Right | Wound class: Clean

## 2011-08-24 MED ORDER — ALUM & MAG HYDROXIDE-SIMETH 200-200-20 MG/5ML PO SUSP: 30.0000 mL | ORAL | Status: DC | PRN

## 2011-08-24 MED ORDER — METHOCARBAMOL 100 MG/ML IJ SOLN
500.0000 mg | Freq: Four times a day (QID) | INTRAVENOUS | Status: DC | PRN
  Filled 2011-08-24: qty 5

## 2011-08-24 MED ORDER — VITAMIN D3 25 MCG (1000 UNIT) PO TABS
5000.0000 [IU] | ORAL_TABLET | Freq: Every day | ORAL | Status: DC
  Administered 2011-08-24 – 2011-08-26 (×3): 5000 [IU] via ORAL
  Filled 2011-08-24 (×3): qty 5

## 2011-08-24 MED ORDER — PROMETHAZINE HCL 25 MG/ML IJ SOLN: 6.2500 mg | INTRAMUSCULAR | Status: DC | PRN

## 2011-08-24 MED ORDER — HYDROMORPHONE HCL PF 1 MG/ML IJ SOLN
0.2500 mg | INTRAMUSCULAR | Status: DC | PRN
  Administered 2011-08-24: 0.5 mg via INTRAVENOUS

## 2011-08-24 MED ORDER — HYDROCODONE-ACETAMINOPHEN 7.5-325 MG PO TABS
1.0000 | ORAL_TABLET | ORAL | Status: DC
  Administered 2011-08-24 – 2011-08-25 (×2): 1 via ORAL
  Administered 2011-08-25 (×5): 2 via ORAL
  Administered 2011-08-26: 1 via ORAL
  Administered 2011-08-26 (×2): 2 via ORAL
  Filled 2011-08-24: qty 2
  Filled 2011-08-24: qty 1
  Filled 2011-08-24: qty 2
  Filled 2011-08-24: qty 1
  Filled 2011-08-24: qty 2
  Filled 2011-08-24: qty 1
  Filled 2011-08-24 (×4): qty 2

## 2011-08-24 MED ORDER — ENOXAPARIN SODIUM 40 MG/0.4ML ~~LOC~~ SOLN
40.0000 mg | SUBCUTANEOUS | Status: DC
  Administered 2011-08-25 – 2011-08-26 (×2): 40 mg via SUBCUTANEOUS
  Filled 2011-08-24 (×3): qty 0.4

## 2011-08-24 MED ORDER — WARFARIN SODIUM 10 MG PO TABS
10.0000 mg | ORAL_TABLET | Freq: Once | ORAL | Status: AC
  Administered 2011-08-24: 10 mg via ORAL
  Filled 2011-08-24: qty 1

## 2011-08-24 MED ORDER — CEFAZOLIN SODIUM-DEXTROSE 2-3 GM-% IV SOLR
2.0000 g | Freq: Once | INTRAVENOUS | Status: AC
  Administered 2011-08-24: 2 g via INTRAVENOUS

## 2011-08-24 MED ORDER — METHOCARBAMOL 500 MG PO TABS
500.0000 mg | ORAL_TABLET | Freq: Four times a day (QID) | ORAL | Status: DC | PRN
  Administered 2011-08-24 – 2011-08-25 (×2): 500 mg via ORAL
  Filled 2011-08-24 (×3): qty 1

## 2011-08-24 MED ORDER — GUAIFENESIN ER 600 MG PO TB12
600.0000 mg | ORAL_TABLET | ORAL | Status: DC | PRN
  Filled 2011-08-24: qty 1

## 2011-08-24 MED ORDER — HYDROMORPHONE HCL PF 1 MG/ML IJ SOLN
INTRAMUSCULAR | Status: AC
  Filled 2011-08-24: qty 1

## 2011-08-24 MED ORDER — ACETAMINOPHEN 650 MG RE SUPP: 650.0000 mg | Freq: Four times a day (QID) | RECTAL | Status: DC | PRN

## 2011-08-24 MED ORDER — GLYCOPYRROLATE 0.2 MG/ML IJ SOLN
INTRAMUSCULAR | Status: DC | PRN
  Administered 2011-08-24: .8 mg via INTRAVENOUS

## 2011-08-24 MED ORDER — DOCUSATE SODIUM 100 MG PO CAPS
100.0000 mg | ORAL_CAPSULE | Freq: Two times a day (BID) | ORAL | Status: DC
  Administered 2011-08-24 – 2011-08-26 (×4): 100 mg via ORAL

## 2011-08-24 MED ORDER — ONDANSETRON HCL 4 MG/2ML IJ SOLN: 4.0000 mg | Freq: Four times a day (QID) | INTRAMUSCULAR | Status: DC | PRN

## 2011-08-24 MED ORDER — CEFAZOLIN SODIUM-DEXTROSE 2-3 GM-% IV SOLR
INTRAVENOUS | Status: AC
  Filled 2011-08-24: qty 50

## 2011-08-24 MED ORDER — PHENOL 1.4 % MT LIQD: 1.0000 | OROMUCOSAL | Status: DC | PRN

## 2011-08-24 MED ORDER — ASPIRIN 81 MG PO CHEW
81.0000 mg | CHEWABLE_TABLET | Freq: Every day | ORAL | Status: DC
  Administered 2011-08-25 – 2011-08-26 (×2): 81 mg via ORAL
  Filled 2011-08-24 (×2): qty 1

## 2011-08-24 MED ORDER — LACTATED RINGERS IV SOLN
INTRAVENOUS | Status: DC | PRN
  Administered 2011-08-24: 14:00:00 via INTRAVENOUS

## 2011-08-24 MED ORDER — FENOFIBRATE 160 MG PO TABS
160.0000 mg | ORAL_TABLET | Freq: Every day | ORAL | Status: DC
  Administered 2011-08-24 – 2011-08-26 (×3): 160 mg via ORAL
  Filled 2011-08-24 (×3): qty 1

## 2011-08-24 MED ORDER — MENTHOL 3 MG MT LOZG: 1.0000 | LOZENGE | OROMUCOSAL | Status: DC | PRN

## 2011-08-24 MED ORDER — PATIENT'S GUIDE TO USING COUMADIN BOOK
Freq: Once | Status: DC
  Filled 2011-08-24: qty 1

## 2011-08-24 MED ORDER — MAGNESIUM CITRATE PO SOLN: 1.0000 | Freq: Once | ORAL | Status: AC | PRN

## 2011-08-24 MED ORDER — DIPHENHYDRAMINE HCL 12.5 MG/5ML PO ELIX: 12.5000 mg | ORAL_SOLUTION | ORAL | Status: DC | PRN

## 2011-08-24 MED ORDER — 0.9 % SODIUM CHLORIDE (POUR BTL) OPTIME
TOPICAL | Status: DC | PRN
  Administered 2011-08-24: 1000 mL

## 2011-08-24 MED ORDER — SUCCINYLCHOLINE CHLORIDE 20 MG/ML IJ SOLN
INTRAMUSCULAR | Status: DC | PRN
  Administered 2011-08-24: 100 mg via INTRAVENOUS

## 2011-08-24 MED ORDER — DEXAMETHASONE SODIUM PHOSPHATE 10 MG/ML IJ SOLN
INTRAMUSCULAR | Status: DC | PRN
  Administered 2011-08-24: 10 mg via INTRAVENOUS

## 2011-08-24 MED ORDER — SODIUM CHLORIDE 0.9 % IV SOLN
INTRAVENOUS | Status: AC
  Administered 2011-08-24 – 2011-08-25 (×2): via INTRAVENOUS
  Filled 2011-08-24 (×4): qty 1000

## 2011-08-24 MED ORDER — WARFARIN VIDEO: Freq: Once | Status: DC

## 2011-08-24 MED ORDER — PROPOFOL 10 MG/ML IV BOLUS
INTRAVENOUS | Status: DC | PRN
  Administered 2011-08-24: 200 mg via INTRAVENOUS

## 2011-08-24 MED ORDER — NEOSTIGMINE METHYLSULFATE 1 MG/ML IJ SOLN
INTRAMUSCULAR | Status: DC | PRN
  Administered 2011-08-24: 5 mg via INTRAVENOUS

## 2011-08-24 MED ORDER — DEXAMETHASONE SODIUM PHOSPHATE 10 MG/ML IJ SOLN
10.0000 mg | Freq: Once | INTRAMUSCULAR | Status: AC
  Administered 2011-08-25: 10 mg via INTRAVENOUS
  Filled 2011-08-24: qty 1

## 2011-08-24 MED ORDER — POLYETHYLENE GLYCOL 3350 17 G PO PACK
17.0000 g | PACK | Freq: Two times a day (BID) | ORAL | Status: DC
  Administered 2011-08-24 – 2011-08-26 (×4): 17 g via ORAL

## 2011-08-24 MED ORDER — METOCLOPRAMIDE HCL 10 MG PO TABS: 5.0000 mg | ORAL_TABLET | Freq: Three times a day (TID) | ORAL | Status: DC | PRN

## 2011-08-24 MED ORDER — LORATADINE 10 MG PO TABS
10.0000 mg | ORAL_TABLET | Freq: Every day | ORAL | Status: DC
  Administered 2011-08-24 – 2011-08-25 (×2): 10 mg via ORAL
  Filled 2011-08-24 (×3): qty 1

## 2011-08-24 MED ORDER — HYDROMORPHONE HCL PF 1 MG/ML IJ SOLN: 0.5000 mg | INTRAMUSCULAR | Status: DC | PRN

## 2011-08-24 MED ORDER — CEFAZOLIN SODIUM 1-5 GM-% IV SOLN
1.0000 g | Freq: Four times a day (QID) | INTRAVENOUS | Status: AC
  Administered 2011-08-24 – 2011-08-25 (×3): 1 g via INTRAVENOUS
  Filled 2011-08-24 (×3): qty 50

## 2011-08-24 MED ORDER — VITAMIN D3 125 MCG (5000 UT) PO TABS: 1.0000 | ORAL_TABLET | Freq: Every day | ORAL | Status: DC

## 2011-08-24 MED ORDER — ACETAMINOPHEN 10 MG/ML IV SOLN
INTRAVENOUS | Status: DC | PRN
  Administered 2011-08-24: 1000 mg via INTRAVENOUS

## 2011-08-24 MED ORDER — ROCURONIUM BROMIDE 100 MG/10ML IV SOLN
INTRAVENOUS | Status: DC | PRN
  Administered 2011-08-24: 40 mg via INTRAVENOUS

## 2011-08-24 MED ORDER — WARFARIN - PHARMACIST DOSING INPATIENT: Freq: Every day | Status: DC

## 2011-08-24 MED ORDER — LIDOCAINE HCL (CARDIAC) 20 MG/ML IV SOLN
INTRAVENOUS | Status: DC | PRN
  Administered 2011-08-24: 100 mg via INTRAVENOUS

## 2011-08-24 MED ORDER — ACETAMINOPHEN 325 MG PO TABS
650.0000 mg | ORAL_TABLET | Freq: Four times a day (QID) | ORAL | Status: DC | PRN
  Filled 2011-08-24: qty 2

## 2011-08-24 MED ORDER — ONDANSETRON HCL 4 MG PO TABS: 4.0000 mg | ORAL_TABLET | Freq: Four times a day (QID) | ORAL | Status: DC | PRN

## 2011-08-24 MED ORDER — ACETAMINOPHEN 10 MG/ML IV SOLN
INTRAVENOUS | Status: AC
  Filled 2011-08-24: qty 100

## 2011-08-24 MED ORDER — METOCLOPRAMIDE HCL 5 MG/ML IJ SOLN: 5.0000 mg | Freq: Three times a day (TID) | INTRAMUSCULAR | Status: DC | PRN

## 2011-08-24 MED ORDER — LACTATED RINGERS IV SOLN: INTRAVENOUS | Status: DC

## 2011-08-24 MED ORDER — FERROUS SULFATE 325 (65 FE) MG PO TABS
325.0000 mg | ORAL_TABLET | Freq: Three times a day (TID) | ORAL | Status: DC
  Administered 2011-08-24 – 2011-08-26 (×6): 325 mg via ORAL
  Filled 2011-08-24 (×8): qty 1

## 2011-08-24 MED ORDER — FENTANYL CITRATE 0.05 MG/ML IJ SOLN
INTRAMUSCULAR | Status: DC | PRN
  Administered 2011-08-24: 50 ug via INTRAVENOUS
  Administered 2011-08-24: 100 ug via INTRAVENOUS
  Administered 2011-08-24 (×2): 50 ug via INTRAVENOUS

## 2011-08-24 MED ORDER — MIDAZOLAM HCL 5 MG/5ML IJ SOLN
INTRAMUSCULAR | Status: DC | PRN
  Administered 2011-08-24: 2 mg via INTRAVENOUS

## 2011-08-24 MED ORDER — ONDANSETRON HCL 4 MG/2ML IJ SOLN
INTRAMUSCULAR | Status: DC | PRN
  Administered 2011-08-24: 4 mg via INTRAVENOUS

## 2011-08-24 MED ORDER — ASPIRIN 81 MG PO TABS: 81.0000 mg | ORAL_TABLET | Freq: Every day | ORAL | Status: DC

## 2011-08-24 SURGICAL SUPPLY — 39 items
BAG ZIPLOCK 12X15 (MISCELLANEOUS) ×4 IMPLANT
BLADE SAW SGTL 18X1.27X75 (BLADE) ×2 IMPLANT
CELLS DAT CNTRL 66122 CELL SVR (MISCELLANEOUS) IMPLANT
CLOTH BEACON ORANGE TIMEOUT ST (SAFETY) ×2 IMPLANT
DERMABOND ADVANCED (GAUZE/BANDAGES/DRESSINGS) ×1
DERMABOND ADVANCED .7 DNX12 (GAUZE/BANDAGES/DRESSINGS) ×1 IMPLANT
DRAPE C-ARM 42X72 X-RAY (DRAPES) ×2 IMPLANT
DRAPE STERI IOBAN 125X83 (DRAPES) ×2 IMPLANT
DRAPE U-SHAPE 47X51 STRL (DRAPES) ×6 IMPLANT
DRSG AQUACEL AG ADV 3.5X10 (GAUZE/BANDAGES/DRESSINGS) ×2 IMPLANT
DRSG TEGADERM 4X4.75 (GAUZE/BANDAGES/DRESSINGS) ×4 IMPLANT
DURAPREP 26ML APPLICATOR (WOUND CARE) ×2 IMPLANT
ELECT BLADE TIP CTD 4 INCH (ELECTRODE) ×2 IMPLANT
ELECT REM PT RETURN 9FT ADLT (ELECTROSURGICAL) ×2
ELECTRODE REM PT RTRN 9FT ADLT (ELECTROSURGICAL) ×1 IMPLANT
EVACUATOR 1/8 PVC DRAIN (DRAIN) ×2 IMPLANT
FACESHIELD LNG OPTICON STERILE (SAFETY) ×8 IMPLANT
GAUZE SPONGE 2X2 8PLY STRL LF (GAUZE/BANDAGES/DRESSINGS) ×1 IMPLANT
GLOVE BIOGEL PI IND STRL 7.5 (GLOVE) ×1 IMPLANT
GLOVE BIOGEL PI IND STRL 8 (GLOVE) ×1 IMPLANT
GLOVE BIOGEL PI INDICATOR 7.5 (GLOVE) ×1
GLOVE BIOGEL PI INDICATOR 8 (GLOVE) ×1
GLOVE ECLIPSE 8.0 STRL XLNG CF (GLOVE) ×2 IMPLANT
GLOVE ORTHO TXT STRL SZ7.5 (GLOVE) ×4 IMPLANT
GOWN BRE IMP PREV XXLGXLNG (GOWN DISPOSABLE) ×4 IMPLANT
GOWN STRL NON-REIN LRG LVL3 (GOWN DISPOSABLE) ×2 IMPLANT
KIT BASIN OR (CUSTOM PROCEDURE TRAY) ×2 IMPLANT
PACK TOTAL JOINT (CUSTOM PROCEDURE TRAY) ×2 IMPLANT
PADDING CAST COTTON 6X4 STRL (CAST SUPPLIES) ×2 IMPLANT
RTRCTR WOUND ALEXIS 18CM MED (MISCELLANEOUS)
SPONGE GAUZE 2X2 STER 10/PKG (GAUZE/BANDAGES/DRESSINGS) ×1
SUCTION FRAZIER 12FR DISP (SUCTIONS) ×2 IMPLANT
SUT MNCRL AB 4-0 PS2 18 (SUTURE) ×2 IMPLANT
SUT VIC AB 1 CT1 36 (SUTURE) ×6 IMPLANT
SUT VIC AB 2-0 CT1 27 (SUTURE) ×2
SUT VIC AB 2-0 CT1 TAPERPNT 27 (SUTURE) ×2 IMPLANT
SUT VLOC 180 0 24IN GS25 (SUTURE) ×2 IMPLANT
TOWEL OR 17X26 10 PK STRL BLUE (TOWEL DISPOSABLE) ×4 IMPLANT
TRAY FOLEY CATH 14FRSI W/METER (CATHETERS) ×2 IMPLANT

## 2011-08-24 NOTE — Preoperative (Signed)
Beta Blockers   Reason not to administer Beta Blockers:Not Applicable 

## 2011-08-24 NOTE — Op Note (Signed)
NAME:  [name]                ACCOUNT NO.: [id number]      MEDICAL RECORD NO.: [id number]      FACILITY:  [facility]      PHYSICIAN:  Durene Romans D  DATE OF BIRTH:  03/27/1945     DATE OF PROCEDURE:  08/24/2011                                 OPERATIVE REPORT         PREOPERATIVE DIAGNOSIS: Right  hip osteoarthritis.      POSTOPERATIVE DIAGNOSIS:  Right hip osteoarthritis.      PROCEDURE:  Right total hip replacement through an anterior approach   utilizing DePuy THR system, component size 56mm pinnacle cup, a size 36+4 neutral   Altrex liner, a size 6 Hi Tri Lock stem with a 36+1.5 delta ceramic   ball.      SURGEON:  Madlyn Frankel. Charlann Boxer, M.D.      ASSISTANT:  Lanney Gins, PA      ANESTHESIA:  General.      SPECIMENS:  None.      COMPLICATIONS:  None.      BLOOD LOSS:  600 cc     DRAINS:  One Hemovac.      INDICATION OF THE PROCEDURE:  [name] is a 66 y.o. male who had   presented to office for evaluation of right hip pain.  Radiographs revealed   progressive degenerative changes with bone-on-bone   articulation to the  hip joint.  The patient had painful limited range of   motion significantly affecting their overall quality of life.  The patient was failing to    respond to conservative measures, and at this point was ready   to proceed with more definitive measures.  The patient has noted progressive   degenerative changes in his hip, progressive problems and dysfunction   with regarding the hip prior to surgery.  Consent was obtained for   benefit of pain relief.  Specific risk of infection, DVT, component   failure, dislocation, need for revision surgery, as well discussion of   the anterior versus posterior approach were reviewed.  Consent was   obtained for benefit of anterior pain relief through an anterior   approach.      PROCEDURE IN DETAIL:  The patient was brought to operative theater.   Once adequate anesthesia, preoperative antibiotics, 2gm Ancef  administered.   The patient was positioned supine on the OSI Hanna table.  Once adequate   padding of boney process was carried out, we had predraped out the hip, and  used fluoroscopy to confirm orientation of the pelvis and position.      The right hip was then prepped and draped from proximal iliac crest to   mid thigh with shower curtain technique.      Time-out was performed identifying the patient, planned procedure, and   extremity.     An incision was then made 2 cm distal and lateral to the   anterior superior iliac spine extending over the orientation of the   tensor fascia lata muscle and sharp dissection was carried down to the   fascia of the muscle and protractor placed in the soft tissues.      The fascia was then incised.  The muscle belly was identified and swept  laterally and retractor placed along the superior neck.  Following   cauterization of the circumflex vessels and removing some pericapsular   fat, a second cobra retractor was placed on the inferior neck.  A third   retractor was placed on the anterior acetabulum after elevating the   anterior rectus.  A L-capsulotomy was along the line of the   superior neck to the trochanteric fossa, then extended proximally and   distally.  Tag sutures were placed and the retractors were then placed   intracapsular.  We then identified the trochanteric fossa and   orientation of my neck cut, confirmed this radiographically   and then made a neck osteotomy with the femur on traction.  The femoral   head was removed without difficulty or complication.  Traction was let   off and retractors were placed posterior and anterior around the   acetabulum.      The labrum and foveal tissue were debrided.  I began reaming with a 47mm   reamer and reamed up to 55mm reamer with good bony bed preparation and a 56   cup was chosen.  There was a significant amount of osteophytes present around the acetabulum which were removed prior to  placing the final cup. The final 56mm Pinnacle cup was then impacted under fluoroscopy  to confirm the depth of penetration and orientation with respect to   abduction.  A screw was placed followed by the hole eliminator.  The final   36+4 neutral Altrex liner was impacted with good visualized rim fit.  The cup was positioned anatomically within the acetabular portion of the pelvis.      At this point, the femur was rolled at 80 degrees.  Further capsule was   released off the inferior aspect of the femoral neck.  I then   released the superior capsule proximally.  The hook was placed laterally   along the femur and elevated manually and held in position with the bed   hook.  The leg was then extended and adducted with the leg rolled to 100   degrees of external rotation.  Once the proximal femur was fully   exposed, I used a box osteotome to set orientation.  I then began   broaching with the starting chili pepper broach and passed this by hand and then broached up to 6.  With the 6 broach in place I chose a high offset neck and did a trial reduction with a 36+1.5.  The offset was appropriate, leg lengths   appeared to be equal, confirmed radiographically.   Given these findings, I went ahead and dislocated the hip, repositioned all   retractors and positioned the right hip in the extended and abducted position.  The final 6 Hi Tri Lock stem was   chosen and it was impacted down to the level of neck cut.  Based on this   and the trial reduction, a 36+1.5 delta ceramic ball was chosen and   impacted onto a clean and dry trunnion, and the hip was reduced.  The   hip had been irrigated throughout the case again at this point.  I did   reapproximate the superior capsular leaflet to the anterior leaflet   using #1 Vicryl, placed a medium Hemovac drain deep.  The fascia of the   tensor fascia lata muscle was then reapproximated using #1 Vicryl.  The   remaining wound was closed with 2-0 Vicryl and  running 4-0 Monocryl.  The hip was cleaned, dried, and dressed sterilely using Dermabond and   Aquacel dressing.  Drain site dressed separately.  She was then brought   to recovery room in stable condition tolerating the procedure well.    Lanney Gins, PA-C was present for the entirety of the case involved from   preoperative positioning, perioperative retractor management, general   facilitation of the case, as well as primary wound closure as assistant.            Madlyn Frankel Charlann Boxer, M.D.            MDO/MEDQ  D:  01/12/2011  T:  01/12/2011  Job:  [id number]      Electronically Signed by Durene Romans M.D. on 01/18/2011 09:15:38 AM

## 2011-08-24 NOTE — Interval H&P Note (Signed)
History and Physical Interval Note:  08/24/2011 12:14 PM  [name]  has presented today for surgery, with the diagnosis of Osteoarthritis of the Right Hip  The various methods of treatment have been discussed with the patient and family. After consideration of risks, benefits and other options for treatment, the patient has consented to  Procedure(s) (LRB): RIGHT TOTAL HIP ARTHROPLASTY ANTERIOR APPROACH (Right) as a surgical intervention .  The patients' history has been reviewed, patient examined, no change in status, stable for surgery.  I have reviewed the patients' chart and labs.  Questions were answered to the patient's satisfaction.     Shelda Pal

## 2011-08-24 NOTE — Plan of Care (Signed)
Problem: Consults Goal: Diagnosis- Total Joint Replacement Primary Total Hip     

## 2011-08-24 NOTE — Progress Notes (Signed)
ANTICOAGULATION CONSULT NOTE - Initial Consult  Pharmacy Consult for Coumadin Indication: VTE prophylaxis and hx of PE98  Allergies  Allergen Reactions  . Statins     REACTION: muscle aches    Patient Measurements:     Vital Signs: Temp: 98.7 F (37.1 C) (06/04 1754) Temp src: Oral (06/04 1112) BP: 143/84 mmHg (06/04 1754) Pulse Rate: 72  (06/04 1754)  Labs:  Basename 08/24/11 1145  HGB --  HCT --  PLT --  APTT --  LABPROT 15.3*  INR 1.18  HEPARINUNFRC --  CREATININE --  CKTOTAL --  CKMB --  TROPONINI --    The CrCl is unknown because both a height and weight (above a minimum accepted value) are required for this calculation.   Medical History: Past Medical History  Diagnosis Date  . Hyperlipidemia   . Seasonal allergies   . Osteoarthritis   . Pulmonary embolism     march 2010, 2012              /   DVT x  2  LEFT 2010  . Coronary artery disease     non-obstructive by cath 2009  . Heart murmur   . Pre-diabetes   . Hypertension     Clearance with note Dr Marvel Plan on chart,  Lovonox bridging  ordered by Dr Sanjuana Kava    EKG 9/12, chst 9/12 EPIC  . Peripheral vascular disease   . Sleep apnea     STOP BANG SCORE 4    Medications:  Scheduled:    . aspirin  81 mg Oral Daily  .  ceFAZolin (ANCEF) IV  1 g Intravenous Q6H  .  ceFAZolin (ANCEF) IV  2 g Intravenous Once  . cholecalciferol  5,000 Units Oral Daily  . dexamethasone  10 mg Intravenous Once  . docusate sodium  100 mg Oral BID  . enoxaparin  40 mg Subcutaneous Q24H  . fenofibrate  160 mg Oral Daily  . ferrous sulfate  325 mg Oral TID PC  . HYDROcodone-acetaminophen  1-2 tablet Oral Q4H  . HYDROmorphone      . loratadine  10 mg Oral Daily  . polyethylene glycol  17 g Oral BID  . DISCONTD: aspirin  81 mg Oral Daily  . DISCONTD: Vitamin D3  1 tablet Oral Daily   Infusions:    . sodium chloride 0.9 % 1,000 mL with potassium chloride 10 mEq infusion    . DISCONTD: lactated ringers     PRN:  acetaminophen, acetaminophen, alum & mag hydroxide-simeth, diphenhydrAMINE, guaiFENesin, HYDROmorphone (DILAUDID) injection, magnesium citrate, menthol-cetylpyridinium, methocarbamol (ROBAXIN) IV, methocarbamol, metoCLOPramide (REGLAN) injection, metoCLOPramide, ondansetron (ZOFRAN) IV, ondansetron, phenol, DISCONTD: 0.9 % irrigation (POUR BTL), DISCONTD:  HYDROmorphone (DILAUDID) injection, DISCONTD: promethazine  Assessment: 66 yo B male post right Hip replacement. Patient has been on chronic coumadin for PE. His home regimen was coumadin 10mg  on S,M,W,F and 5mg  Tu/Th/Sat.  Goal of Therapy:  INR 2-3 Monitor platelets by anticoagulation protocol: Yes  Plan:   Coumadin 10mg  tonight  INR daily in AM while on coumadin  Loletta Specter 08/24/2011,6:29 PM

## 2011-08-24 NOTE — Anesthesia Preprocedure Evaluation (Addendum)
Anesthesia Evaluation  Patient identified by MRN, date of birth, ID band Patient awake    Reviewed: Allergy & Precautions, H&P , NPO status , Patient's Chart, lab work & pertinent test results  Airway Mallampati: II TM Distance: >3 FB Neck ROM: Full    Dental No notable dental hx.    Pulmonary neg pulmonary ROS,  breath sounds clear to auscultation  Pulmonary exam normal       Cardiovascular hypertension, Pt. on medications II+ Valvular Problems/Murmurs MR Rhythm:Regular Rate:Normal  Chronic coumadin therapy for PE   Neuro/Psych negative neurological ROS  negative psych ROS   GI/Hepatic negative GI ROS, Neg liver ROS,   Endo/Other  negative endocrine ROS  Renal/GU negative Renal ROS  negative genitourinary   Musculoskeletal negative musculoskeletal ROS (+)   Abdominal   Peds negative pediatric ROS (+)  Hematology negative hematology ROS (+)   Anesthesia Other Findings   Reproductive/Obstetrics negative OB ROS                        Anesthesia Physical Anesthesia Plan  ASA: III  Anesthesia Plan: General   Post-op Pain Management:    Induction: Intravenous  Airway Management Planned: Oral ETT  Additional Equipment:   Intra-op Plan:   Post-operative Plan: Extubation in OR  Informed Consent: I have reviewed the patients History and Physical, chart, labs and discussed the procedure including the risks, benefits and alternatives for the proposed anesthesia with the patient or authorized representative who has indicated his/her understanding and acceptance.   Dental advisory given  Plan Discussed with: CRNA  Anesthesia Plan Comments:        Anesthesia Quick Evaluation

## 2011-08-24 NOTE — Transfer of Care (Signed)
Immediate Anesthesia Transfer of Care Note  Patient: [name]  Procedure(s) Performed: Procedure(s) (LRB): TOTAL HIP ARTHROPLASTY ANTERIOR APPROACH (Right)  Patient Location: PACU  Anesthesia Type: General  Level of Consciousness: awake, alert , oriented and patient cooperative  Airway & Oxygen Therapy: Patient Spontanous Breathing and Patient connected to face mask oxygen  Post-op Assessment: Report given to PACU RN and Post -op Vital signs reviewed and stable  Post vital signs: Reviewed and stable  Complications: No apparent anesthesia complications

## 2011-08-24 NOTE — Anesthesia Postprocedure Evaluation (Signed)
  Anesthesia Post Note  Patient: [name]  Procedure(s) Performed: Procedure(s) (LRB): TOTAL HIP ARTHROPLASTY ANTERIOR APPROACH (Right)  Anesthesia type: General  Patient location: PACU  Post pain: Pain level controlled  Post assessment: Post-op Vital signs reviewed  Last Vitals:  Filed Vitals:   08/24/11 1630  BP: 149/86  Pulse: 93  Temp:   Resp: 13    Post vital signs: Reviewed  Level of consciousness: sedated  Complications: No apparent anesthesia complications

## 2011-08-25 LAB — CBC
MCH: 30.1 pg (ref 26.0–34.0)
MCHC: 33.9 g/dL (ref 30.0–36.0)
Platelets: 204 10*3/uL (ref 150–400)

## 2011-08-25 LAB — BASIC METABOLIC PANEL
Calcium: 9.1 mg/dL (ref 8.4–10.5)
GFR calc non Af Amer: 55 mL/min — ABNORMAL LOW (ref >90)
Glucose, Bld: 141 mg/dL — ABNORMAL HIGH (ref 70–99)
Sodium: 137 mEq/L (ref 135–145)

## 2011-08-25 LAB — PROTIME-INR: Prothrombin Time: 16 seconds — ABNORMAL HIGH (ref 11.6–15.2)

## 2011-08-25 MED ORDER — WARFARIN SODIUM 10 MG PO TABS
10.0000 mg | ORAL_TABLET | Freq: Once | ORAL | Status: AC
  Administered 2011-08-25: 10 mg via ORAL
  Filled 2011-08-25: qty 1

## 2011-08-25 NOTE — Evaluation (Signed)
Physical Therapy Evaluation Patient Details Name: [name] MRN: [id number] DOB: [birth year] Today's Date: 08/25/2011 Time: 4098-1191 PT Time Calculation (min): 32 min  PT Assessment / Plan / Recommendation Clinical Impression  Pt presents s/p R THR (direct anterior) with limited mobility.  Tolerated ambulation very well in hallway at supervision level for safety.  Pt will benefit from skilled PT in acute venue to address deficits.  PT recommends HHPT for follow up therapy at D/C to return pt to prior level of fucntioning.     PT Assessment  Patient needs continued PT services    Follow Up Recommendations  Home health PT    Barriers to Discharge None      lEquipment Recommendations  Rolling walker with 5" wheels;Cane (Pt may transition to cane, TBD)    Recommendations for Other Services OT consult   Frequency 7X/week    Precautions / Restrictions Precautions Precautions: None Restrictions Weight Bearing Restrictions: No Other Position/Activity Restrictions: WBAT   Pertinent Vitals/Pain 2/10      Mobility  Bed Mobility Bed Mobility: Supine to Sit;Sitting - Scoot to Edge of Bed Supine to Sit: 4: Min guard Sitting - Scoot to [place] of Bed: 5: Supervision Details for Bed Mobility Assistance: Supervision and cues for safety. Some guarding initially for RLE off EOB.   Transfers Transfers: Sit to Stand;Stand to Sit Sit to Stand: 4: Min guard;From elevated surface;With upper extremity assist;From bed Stand to Sit: 4: Min guard;With upper extremity assist;With armrests;To chair/3-in-1 Details for Transfer Assistance: Min/guard for safety with cues for hand placement when sitting and standing.  Ambulation/Gait Ambulation/Gait Assistance: 5: Supervision;4: Min guard Ambulation Distance (Feet): 160 Feet Assistive device: Rolling walker Ambulation/Gait Assistance Details: Cues initially for step to technique, however pt did very well transitioning to step through gait pattern.     Gait Pattern: Step-to pattern;Step-through pattern Stairs: No Wheelchair Mobility Wheelchair Mobility: No    Exercises     PT Diagnosis: Difficulty walking;Generalized weakness  PT Problem List: Decreased strength;Decreased mobility;Decreased balance;Decreased knowledge of use of DME PT Treatment Interventions: DME instruction;Gait training;Stair training;Functional mobility training;Therapeutic activities;Therapeutic exercise;Balance training;Patient/family education   PT Goals Acute Rehab PT Goals PT Goal Formulation: With patient Time For Goal Achievement: 08/27/11 Potential to Achieve Goals: Good Pt will go Sit to Supine/Side: with supervision PT Goal: Sit to Supine/Side - Progress: Goal set today Pt will go Sit to Stand: with supervision PT Goal: Sit to Stand - Progress: Goal set today Pt will Ambulate: >150 feet;with modified independence;with least restrictive assistive device PT Goal: Ambulate - Progress: Goal set today Pt will Go Up / Down Stairs: 6-9 stairs;with supervision;with least restrictive assistive device PT Goal: Up/Down Stairs - Progress: Goal set today  Visit Information  Last PT Received On: 08/25/11 Assistance Needed: +1    Subjective Data  Subjective: I don't know my pain, I haven;'t moved Patient Stated Goal: to return home   Prior Functioning  Home Living Lives With: Spouse Available Help at Discharge: Family Type of Home: House Home Access: Stairs to enter Secretary/administrator of Steps: 6-7 Entrance Stairs-Rails: Right Home Layout: One level Bathroom Shower/Tub: Engineer, manufacturing systems: Standard Home Adaptive Equipment: None Prior Function Level of Independence: Independent Able to Take Stairs?: Yes Driving: Yes Vocation: Retired Musician: No difficulties    Cognition  Overall Cognitive Status: Appears within functional limits for tasks assessed/performed Arousal/Alertness: Awake/alert Orientation Level:  Appears intact for tasks assessed Behavior During Session: [facility] for tasks performed    Extremity/Trunk Assessment  Right Lower Extremity Assessment RLE ROM/Strength/Tone: Deficits RLE ROM/Strength/Tone Deficits: Per functional assessment 3/5 RLE Sensation: WFL - Light Touch RLE Coordination: WFL - gross motor Left Lower Extremity Assessment LLE ROM/Strength/Tone: Within functional levels LLE Sensation: WFL - Light Touch LLE Coordination: WFL - gross motor Trunk Assessment Trunk Assessment: Normal   Balance    End of Session PT - End of Session Activity Tolerance: Patient tolerated treatment well Patient left: in chair;with call bell/phone within reach Nurse Communication: Mobility status   [name], [name] 08/25/2011, 10:13 AM

## 2011-08-25 NOTE — Progress Notes (Addendum)
Subjective: 1 Day Post-Op Procedure(s) (LRB): TOTAL HIP ARTHROPLASTY ANTERIOR APPROACH (Right)   Patient reports pain as mild, pain well controlled. No events throughout the night.  Objective:   VITALS:   Filed Vitals:   08/25/11 0742  BP: 146/95  Pulse: 74  Temp: 98.7 F (37.1 C)   Resp: 16    Neurovascular intact Dorsiflexion/Plantar flexion intact Incision: dressing C/D/I No cellulitis present Compartment soft  LABS  Basename 08/25/11 0420  HGB 11.5*  HCT 33.9*  WBC 7.0  PLT 204     Basename 08/25/11 0420  NA 137  K 4.3  BUN 14  CREATININE 1.32  GLUCOSE 141*     Assessment/Plan: 1 Day Post-Op Procedure(s) (LRB): TOTAL HIP ARTHROPLASTY ANTERIOR APPROACH (Right)   HV drain d/c'ed Foley cath d/c'ed Advance diet Up with therapy D/C IV fluids Plan for discharge tomorrow if continues to do well   Anastasio Auerbach. Karly Pitter   PAC  08/25/2011, 9:00 AM

## 2011-08-25 NOTE — Progress Notes (Signed)
ANTICOAGULATION CONSULT NOTE - Follow Up Consult  Pharmacy Consult for Warfarin Indication: Hx PE/DVT s/p R THA  Allergies  Allergen Reactions  . Statins     REACTION: muscle aches   Patient Measurements: Height: 6' (182.9 cm) Weight: 217 lb (98.431 kg) IBW/kg (Calculated) : 77.6   Vital Signs: Temp: 97.5 F (36.4 C) (06/05 1015) Temp src: Oral (06/05 1015) BP: 126/74 mmHg (06/05 1015) Pulse Rate: 72  (06/05 1015)  Labs:  Basename 08/25/11 0420 08/24/11 1145  HGB 11.5* --  HCT 33.9* --  PLT 204 --  APTT -- --  LABPROT 16.0* 15.3*  INR 1.25 1.18  HEPARINUNFRC -- --  CREATININE 1.32 --  CKTOTAL -- --  CKMB -- --  TROPONINI -- --    Estimated Creatinine Clearance: 67.8 ml/min (by C-G formula based on Cr of 1.32).   Medications:  Scheduled:    . aspirin  81 mg Oral Daily  .  ceFAZolin (ANCEF) IV  1 g Intravenous Q6H  .  ceFAZolin (ANCEF) IV  2 g Intravenous Once  . cholecalciferol  5,000 Units Oral Daily  . dexamethasone  10 mg Intravenous Once  . docusate sodium  100 mg Oral BID  . enoxaparin  40 mg Subcutaneous Q24H  . fenofibrate  160 mg Oral Daily  . ferrous sulfate  325 mg Oral TID PC  . HYDROcodone-acetaminophen  1-2 tablet Oral Q4H  . HYDROmorphone      . loratadine  10 mg Oral Daily  . patient's guide to using coumadin book   Does not apply Once  . polyethylene glycol  17 g Oral BID  . warfarin  10 mg Oral Once  . warfarin   Does not apply Once  . Warfarin - Pharmacist Dosing Inpatient   Does not apply q1800  . DISCONTD: aspirin  81 mg Oral Daily  . DISCONTD: patient's guide to using coumadin book   Does not apply Once  . DISCONTD: Vitamin D3  1 tablet Oral Daily   Assessment:  66 yo male s/p R THA, hx L THA 2010  Chronic Coumadin for hx PE/DVT; home dose 5mg  Tu,Th,Sat, and 10mg  other days. Last dose 5/29  Lovenox bridge until INR therapeutic; 40mg  daily  INR 1.25 after 10mg  Coumadin 6/4  Goal of Therapy:  INR 2-3   Plan:  Repeat 10mg   Coumadin tonight Daily PT/INR  Otho Bellows PharmD Pager [phone] 08/25/2011,11:55 AM

## 2011-08-25 NOTE — Progress Notes (Signed)
Physical Therapy Treatment Patient Details Name: [name] MRN: [id number] DOB: 1945-06-09 Today's Date: 08/25/2011 Time: 2725-3664 PT Time Calculation (min): 33 min  PT Assessment / Plan / Recommendation Comments on Treatment Session  Pt progressing very well with ambulation and stair training.  Only got to perform 2-3 steps due to IV, however will practice again tomorrow.      Follow Up Recommendations  Home health PT    Barriers to Discharge        Equipment Recommendations  Rolling walker with 5" wheels;Cane    Recommendations for Other Services    Frequency 7X/week   Plan Discharge plan remains appropriate    Precautions / Restrictions Precautions Precautions: None Restrictions Weight Bearing Restrictions: No Other Position/Activity Restrictions: WBAT   Pertinent Vitals/Pain 2/10    Mobility  Bed Mobility Bed Mobility: Supine to Sit Supine to Sit: 5: Supervision;HOB elevated Details for Bed Mobility Assistance: Supervision for safety.  Demos good UE technique.  Transfers Transfers: Sit to Stand;Stand to Sit Sit to Stand: 5: Supervision;From elevated surface;With upper extremity assist;From bed Stand to Sit: 5: Supervision;With upper extremity assist;To bed Details for Transfer Assistance: Supervision and cues for safety.  Ambulation/Gait Ambulation/Gait Assistance: 5: Supervision Ambulation Distance (Feet): 300 Feet Assistive device: Rolling walker Ambulation/Gait Assistance Details: Cues for safety due to pt tendency to stop to do something or look around and take hands off walker.   Gait Pattern: Step-through pattern Gait velocity: WFL Stairs: Yes Stairs Assistance: 4: Min assist Stairs Assistance Details (indicate cue type and reason): Cues for sequencing/technique with hand held assist on one side of pt and rail on the other.   Stair Management Technique: One rail Left;Forwards;Step to pattern (with hand held assist ) Number of Stairs: 3     Exercises  Total Joint Exercises Hip ABduction/ADduction: AROM;Right;10 reps;Standing Knee Flexion: AROM;Right;10 reps;Standing Marching in Standing: AROM;Right;10 reps;Standing   PT Diagnosis:    PT Problem List:   PT Treatment Interventions:     PT Goals Acute Rehab PT Goals PT Goal Formulation: With patient Time For Goal Achievement: 08/27/11 Potential to Achieve Goals: Good Pt will go Sit to Supine/Side: with supervision PT Goal: Sit to Supine/Side - Progress: Met Pt will go Sit to Stand: with supervision PT Goal: Sit to Stand - Progress: Met Pt will Ambulate: >150 feet;with modified independence;with least restrictive assistive device PT Goal: Ambulate - Progress: Progressing toward goal Pt will Go Up / Down Stairs: 6-9 stairs;with supervision;with least restrictive assistive device PT Goal: Up/Down Stairs - Progress: Progressing toward goal  Visit Information  Last PT Received On: 08/25/11 Assistance Needed: +1    Subjective Data  Subjective: Lets do the stairs Patient Stated Goal: to return home   Cognition  Overall Cognitive Status: Appears within functional limits for tasks assessed/performed Arousal/Alertness: Awake/alert Orientation Level: Appears intact for tasks assessed Behavior During Session: [facility] for tasks performed    Balance     End of Session PT - End of Session Activity Tolerance: Patient tolerated treatment well Patient left: in bed;with call bell/phone within reach;with family/visitor present (sitting on EOB)    [name] 08/25/2011, 4:58 PM

## 2011-08-26 ENCOUNTER — Encounter (HOSPITAL_COMMUNITY): Payer: Self-pay | Admitting: Orthopedic Surgery

## 2011-08-26 LAB — PROTIME-INR
INR: 1.62 — ABNORMAL HIGH (ref 0.00–1.49)
Prothrombin Time: 19.5 seconds — ABNORMAL HIGH (ref 11.6–15.2)

## 2011-08-26 LAB — BASIC METABOLIC PANEL
BUN: 18 mg/dL (ref 6–23)
Calcium: 9.3 mg/dL (ref 8.4–10.5)
Creatinine, Ser: 1.33 mg/dL (ref 0.50–1.35)
GFR calc Af Amer: 63 mL/min — ABNORMAL LOW (ref >90)
GFR calc non Af Amer: 55 mL/min — ABNORMAL LOW (ref >90)
Potassium: 3.9 mEq/L (ref 3.5–5.1)

## 2011-08-26 LAB — CBC
HCT: 32.7 % — ABNORMAL LOW (ref 39.0–52.0)
MCHC: 33.6 g/dL (ref 30.0–36.0)
RDW: 13.5 % (ref 11.5–15.5)

## 2011-08-26 MED ORDER — ENOXAPARIN SODIUM 40 MG/0.4ML ~~LOC~~ SOLN: 40.0000 mg | SUBCUTANEOUS | Status: DC

## 2011-08-26 MED ORDER — WARFARIN SODIUM 5 MG PO TABS: 5.0000 mg | ORAL_TABLET | Freq: Every day | ORAL | Status: DC

## 2011-08-26 MED ORDER — FERROUS SULFATE 325 (65 FE) MG PO TABS: 325.0000 mg | ORAL_TABLET | Freq: Three times a day (TID) | ORAL | Status: DC

## 2011-08-26 MED ORDER — HYDROCODONE-ACETAMINOPHEN 7.5-325 MG PO TABS: 1.0000 | ORAL_TABLET | ORAL | Status: AC | PRN

## 2011-08-26 MED ORDER — DSS 100 MG PO CAPS: 100.0000 mg | ORAL_CAPSULE | Freq: Two times a day (BID) | ORAL | Status: AC

## 2011-08-26 MED ORDER — WARFARIN SODIUM 7.5 MG PO TABS
7.5000 mg | ORAL_TABLET | Freq: Once | ORAL | Status: DC
  Filled 2011-08-26: qty 1

## 2011-08-26 MED ORDER — METHOCARBAMOL 500 MG PO TABS: 500.0000 mg | ORAL_TABLET | Freq: Four times a day (QID) | ORAL | Status: AC | PRN

## 2011-08-26 MED ORDER — POLYETHYLENE GLYCOL 3350 17 G PO PACK: 17.0000 g | PACK | Freq: Two times a day (BID) | ORAL | Status: AC

## 2011-08-26 NOTE — Discharge Summary (Signed)
Physician Discharge Summary  Patient ID: [name] MRN: [id number] DOB/AGE: 28-Jul-1945 66 y.o.  Admit date: 08/24/2011 Discharge date: 08/26/2011  Procedures:  Procedure(s) (LRB): TOTAL HIP ARTHROPLASTY ANTERIOR APPROACH (Right)  Attending Physician:  Dr. Durene Griffin   Admission Diagnoses:  Right hip OA and pain   Discharge Diagnoses:  Principal Problem:  *S/P right THA, AA Hyperlipidemia  Seasonal allergies   Osteoarthritis   Pulmonary embolism - march 2010, 2012 / DVT x 2 LEFT 2010   Coronary artery disease   Heart murmur   Pre-diabetes   Hypertension - Clearance with note Dr [name] on chart, Lovonox bridging ordered by Dr [name] EKG 9/12, chst 9/12 EPIC   Peripheral vascular disease   Sleep apnea   HPI: Pt is a 66 y.o. male complaining of right hip pain for 4+ years. Previous left THA in 2010 which has been doing well. Pain in the right hip has really increase in the last 2 years. X-rays in the clinic show end-stage arthritic changes of the right hip. Pt has tried various conservative treatments which have failed to alleviate their symptoms, including PT. Various options are discussed with the patient. Risks, benefits and expectations were discussed with the patient. Patient understand the risks, benefits and expectations and wishes to proceed with surgery.  PCP: Marcus Corwin, MD, MD   Discharged Condition: good  Hospital Course:  Patient underwent the above stated procedure on 08/24/2011. Patient tolerated the procedure well and brought to the recovery room in good condition and subsequently to the floor.  POD #1 BP: 146/95 ; Pulse: 74 ; Temp: 98.7 F (37.1 C) ; Resp: 16  Pt's foley was removed, as well as the hemovac drain removed. IV was changed to a saline lock. Patient reports pain as mild, pain well controlled. No events throughout the night. Neurovascular intact, dorsiflexion/plantar flexion intact, incision: dressing C/Griffin/I, no cellulitis present and  compartment soft.   LABS  Basename  08/25/11 0420   HGB  11.5  HCT  33.9    POD #2  BP: 112/75 ; Pulse: 76 ; Temp: 98.5 F (36.9 C) ; Resp: 18  Patient reports pain as mild, well controlled with medication. No events throughout the night. Ready to be discharged home. Neurovascular intact, dorsiflexion/plantar flexion intact, incision: dressing C/Griffin/I, no cellulitis present and compartment soft.   LABS  Basename  08/26/11 0408   HGB  11.0  HCT  32.7    Discharge Exam: General appearance: alert, cooperative and no distress Extremities: Homans sign is negative, no sign of DVT, no edema, redness or tenderness in the calves or thighs and no ulcers, gangrene or trophic changes  Disposition:  Home  with follow up in 2 weeks  Follow-up Information    Follow up with [name] in 2 weeks.   Contact information:   [facility] [address] [place] [address] [phone]          Discharge Orders    Future Orders Please Complete By Expires   Diet - low sodium heart healthy      Call MD / Call 911      Comments:   If you experience chest pain or shortness of breath, CALL 911 and be transported to the hospital emergency room.  If you develope a fever above 101 F, pus (white drainage) or increased drainage or redness at the wound, or calf pain, call your surgeon's office.   Discharge instructions      Comments:  Maintain surgical dressing for 8 days, then replace with gauze and tape. Keep the area dry and clean until follow up. Follow up in 2 weeks at [facility]. Call with any questions or concerns.     Constipation Prevention      Comments:   Drink plenty of fluids.  Prune juice may be helpful.  You may use a stool softener, such as Colace (over the counter) 100 mg twice a day.  Use MiraLax (over the counter) for constipation as needed.   Increase activity slowly as tolerated      Driving restrictions       Comments:   No driving for 4 weeks   Change dressing      Comments:   Maintain surgical dressing for 8 days, then replace with 4x4 guaze and tape. Keep the area dry and clean.   TED hose      Comments:   Use stockings (TED hose) for 2 weeks on both leg(s).  You may remove them at night for sleeping.      Current Discharge Medication List    START taking these medications   Details  docusate sodium 100 MG CAPS Take 100 mg by mouth 2 (two) times daily.    enoxaparin (LOVENOX) 40 MG/0.4ML injection Inject 0.4 mLs (40 mg total) into the skin daily. Qty: 12 Syringe, Refills: 0   Comments: Used with Coumadin until obtained INR 2-3    ferrous sulfate 325 (65 FE) MG tablet Take 1 tablet (325 mg total) by mouth 3 (three) times daily after meals.    HYDROcodone-acetaminophen (NORCO) 7.5-325 MG per tablet Take 1-2 tablets by mouth every 4 (four) hours as needed for pain. Qty: 120 tablet, Refills: 0    methocarbamol (ROBAXIN) 500 MG tablet Take 1 tablet (500 mg total) by mouth every 6 (six) hours as needed (muscle spasms). Qty: 50 tablet, Refills: 0    polyethylene glycol (MIRALAX / GLYCOLAX) packet Take 17 g by mouth 2 (two) times daily.      CONTINUE these medications which have CHANGED   Details  warfarin (COUMADIN) 5 MG tablet Take 1-2 tablets (5-10 mg total) by mouth daily. 2 tablets everyday except Tuesday Thursday and Saturday only takes 1 tablet on those days Qty: 60 tablet, Refills: 0   Comments: Last dose in hospital is 7.5 mg.    To be monitored and adjusted daily by [facility] and pharmacy to obtain and maintain INR 2-3. Used with Lovenox until INR appropriate and then Lovenox will be Griffin/c'ed.      CONTINUE these medications which have NOT CHANGED   Details  aspirin 81 MG tablet Take 81 mg by mouth daily.     benazepril (LOTENSIN) 20 MG tablet Take 10 mg by mouth every evening. Takes 1/2 tablet to equal 10mg     Cholecalciferol (VITAMIN D3) 5000 UNITS TABS Take 1 tablet by  mouth daily.    fenofibrate micronized (LOFIBRA) 134 MG capsule Take 134 mg by mouth every evening.     fexofenadine (ALLEGRA) 30 MG tablet Take 30 mg by mouth daily.    guaiFENesin (MUCINEX) 600 MG 12 hr tablet Take 600 mg by mouth as needed.     Flaxseed, Linseed, (FLAX SEED OIL) 1000 MG CAPS Take 1,000 mg by mouth 2 (two) times daily.    Vitamin Griffin, Ergocalciferol, (DRISDOL) 50000 UNITS CAPS Take 50,000 Units by mouth every 7 (seven) days.      STOP taking these medications     enoxaparin (LOVENOX) 150  MG/ML injection Comments:  Reason for Stopping:       naproxen sodium (ANAPROX) 220 MG tablet Comments:  Reason for Stopping:            Signed: Anastasio Auerbach. Colbie Sliker   PAC  08/26/2011, 10:24 AM

## 2011-08-26 NOTE — Progress Notes (Signed)
Subjective: 2 Days Post-Op Procedure(s) (LRB): TOTAL HIP ARTHROPLASTY ANTERIOR APPROACH (Right)   Patient reports pain as mild, well controlled with medication. No events throughout the night. Ready to be discharged home.  Objective:   VITALS:   Filed Vitals:   08/26/11 0610  BP: 112/75  Pulse: 76  Temp: 98.5 F (36.9 C)  Resp: 18    Neurovascular intact Dorsiflexion/Plantar flexion intact Incision: dressing C/D/I No cellulitis present Compartment soft  LABS  Basename 08/26/11 0408 08/25/11 0420  HGB 11.0* 11.5*  HCT 32.7* 33.9*  WBC 11.0* 7.0  PLT 182 204     Basename 08/26/11 0408 08/25/11 0420  NA 136 137  K 3.9 4.3  BUN 18 14  CREATININE 1.33 1.32  GLUCOSE 131* 141*     Assessment/Plan: 2 Days Post-Op Procedure(s) (LRB): TOTAL HIP ARTHROPLASTY ANTERIOR APPROACH (Right)   Up with therapy Discharge home with home health Follow up in 2 weeks at [facility]  Follow-up Information    Follow up with OLIN,Yunior Jain D in 2 weeks.   Contact information:   [facility] [address] [place] [address] [phone]          Anastasio Auerbach. Haaris Metallo   PAC  08/26/2011, 9:05 AM

## 2011-08-26 NOTE — Progress Notes (Addendum)
Physical Therapy Treatment Patient Details Name: ENRRIQUE MIERZWA MRN: [id number] DOB: 12-Jan-1946 Today's Date: 08/26/2011 Time: 0920-0940 PT Time Calculation (min): 20 min  PT Assessment / Plan / Recommendation Comments on Treatment Session  DCing home today. No further questions/concerns from pt. All education completed. Pt prefers to use RW for a little while longer.     Follow Up Recommendations  Home health PT    Barriers to Discharge        Equipment Recommendations  Rolling walker with 5" wheels    Recommendations for Other Services    Frequency 7X/week   Plan Discharge plan remains appropriate    Precautions / Restrictions Precautions Precautions: None Restrictions Weight Bearing Restrictions: No RLE Weight Bearing: Weight bearing as tolerated   Pertinent Vitals/Pain     Mobility  Bed Mobility Bed Mobility: Not assessed Transfers Transfers: Sit to Stand;Stand to Sit Sit to Stand: 6: Modified independent (Device/Increase time) Stand to Sit: 6: Modified independent (Device/Increase time) Ambulation/Gait Ambulation/Gait Assistance: 6: Modified independent (Device/Increase time) Ambulation Distance (Feet): 300 Feet Assistive device: Rolling walker Ambulation/Gait Assistance Details: Discussed cane use. Pt took 2-3 steps with cane but gait pattern was antalgic. Pt/therapist agreed pt should use RW for a little while longer. Encouraged pt to use RW when ambulating. Pt sometimes gets up without use of AD Gait Pattern: Step-through pattern;Decreased stride length Stairs: Yes Stairs Assistance: 4: Min guard Stairs Assistance Details (indicate cue type and reason): 1 crutch, 1 rail for stair negotiation. Performed well. VCs safety, technique, sequence. . Stair Management Technique: With crutches;One rail Left;Forwards Number of Stairs: 4     Exercises Total Joint Exercises Ankle Circles/Pumps: AROM;Both;10 reps;Seated Quad Sets: AROM;Both;10 reps;Seated Heel Slides:  AROM;10 reps;Seated;Right Hip ABduction/ADduction: AROM;Right;10 reps;Seated Long Arc Quad: AROM;Right;10 reps;Seated   PT Diagnosis:    PT Problem List:   PT Treatment Interventions:     PT Goals Acute Rehab PT Goals PT Goal: Sit to Stand - Progress: Met PT Goal: Ambulate - Progress: Met PT Goal: Up/Down Stairs - Progress: Progressing toward goal  Visit Information  Last PT Received On: 08/26/11 Assistance Needed: +1    Subjective Data  Subjective: "I don't think I'm ready for the crutch yet" Patient Stated Goal: Home   Cognition  Overall Cognitive Status: Appears within functional limits for tasks assessed/performed Arousal/Alertness: Awake/alert Orientation Level: Appears intact for tasks assessed Behavior During Session: [facility] for tasks performed    Balance     End of Session PT - End of Session Equipment Utilized During Treatment: Gait belt Activity Tolerance: Patient tolerated treatment well Patient left: in chair;with call bell/phone within reach    Rebeca Alert [facility] 08/26/2011, 9:45 AM [phone]

## 2011-08-26 NOTE — Care Management Note (Signed)
    Page 1 of 2   08/26/2011     1:05:35 PM   CARE MANAGEMENT NOTE 08/26/2011  Patient:  [name], [name]   Account Number:  [id number]  Date Initiated:  08/25/2011  Documentation initiated by:  Colleen Can  Subjective/Objective Assessment:   dx osteoarthritis right hip; total hip replacemnt-anterior approach     Action/Plan:   CM spoke with patient and spouse. Current plans are for patient to return to his home in [place] where spouse will be caregiver. He is requesting [facility] for [facility] services. He needs rw, 3n1   Anticipated DC Date:  08/26/2011   Anticipated DC Plan:  HOME W HOME HEALTH SERVICES  In-house referral  NA      DC Planning Services  CM consult      [facility] Choice  HOME HEALTH  DURABLE MEDICAL EQUIPMENT   Choice offered to / List presented to:  C-1 Patient   DME arranged  3-N-1  WALKER - ROLLING  TUB BENCH      DME agency  [facility]     HH arranged  HH-1 RN  HH-2 PT      [facility] agency  [facility]   Status of service:  Completed, signed off Medicare Important Message given?  NO (If response is "NO", the following Medicare IM given date fields will be blank) Date Medicare IM given:   Date Additional Medicare IM given:    Discharge Disposition:  HOME W HOME HEALTH SERVICES  Per UR Regulation:    If discussed at Long Length of Stay Meetings, dates discussed:    Comments:  08/26/2011 Raynelle Bring BSN CCM [phone] DME delivered to rm. [facility] will sart [facility] services tomorrow/hr for coumadin managemnt, HHpt  08/25/2011 List of  hh agencies placed in shadow chart.

## 2011-08-26 NOTE — Progress Notes (Signed)
ANTICOAGULATION CONSULT NOTE - Follow Up Consult  Pharmacy Consult for Warfarin Indication: Hx PE/DVT s/p R THA  Allergies  Allergen Reactions  . Statins     REACTION: muscle aches   Patient Measurements: Height: 6' (182.9 cm) Weight: 217 lb (98.431 kg) IBW/kg (Calculated) : 77.6   Vital Signs: Temp: 98.5 F (36.9 C) (06/06 0610) Temp src: Oral (06/06 0610) BP: 112/75 mmHg (06/06 0610) Pulse Rate: 76  (06/06 0610)  Labs:  Basename 08/26/11 0408 08/25/11 0420 08/24/11 1145  HGB 11.0* 11.5* --  HCT 32.7* 33.9* --  PLT 182 204 --  APTT -- -- --  LABPROT 19.5* 16.0* 15.3*  INR 1.62* 1.25 1.18  HEPARINUNFRC -- -- --  CREATININE 1.33 1.32 --  CKTOTAL -- -- --  CKMB -- -- --  TROPONINI -- -- --    Estimated Creatinine Clearance: 67.3 ml/min (by C-G formula based on Cr of 1.33).   Medications:  Scheduled:     . aspirin  81 mg Oral Daily  .  ceFAZolin (ANCEF) IV  1 g Intravenous Q6H  . cholecalciferol  5,000 Units Oral Daily  . dexamethasone  10 mg Intravenous Once  . docusate sodium  100 mg Oral BID  . enoxaparin  40 mg Subcutaneous Q24H  . fenofibrate  160 mg Oral Daily  . ferrous sulfate  325 mg Oral TID PC  . HYDROcodone-acetaminophen  1-2 tablet Oral Q4H  . loratadine  10 mg Oral Daily  . polyethylene glycol  17 g Oral BID  . warfarin  10 mg Oral ONCE-1800  . Warfarin - Pharmacist Dosing Inpatient   Does not apply q1800  . DISCONTD: patient's guide to using coumadin book   Does not apply Once  . DISCONTD: warfarin   Does not apply Once   Assessment:  66 yo male s/p R THA, hx L THA 2010  Chronic Coumadin for hx PE/DVT; home dose 5mg  Tu,Th,Sat, and 10mg  other days. Last dose 5/29  Concurrent aspirin 81mg  daily noted  Lovenox bridge until INR therapeutic; 40mg  daily  Reloading in progress, INR responding after 10mg  x 2 No bleeding/complications reported.  Goal of Therapy:  INR 2-3   Plan:   Coumadin 7.5mg  po today  Continue Lovenox as  ordered  Daily PT/INR  Loralee Pacas, PharmD, BCPS Pager: [phone] 08/26/2011,7:44 AM

## 2011-08-26 NOTE — Evaluation (Signed)
Occupational Therapy Evaluation Patient Details Name: [name] MRN: [id number] DOB: 09-01-1945 Today's Date: 08/26/2011 Time: 1191-4782 OT Time Calculation (min): 15 min  OT Assessment / Plan / Recommendation Clinical Impression  Pt is a R direct anterior THR and is discharging home today. No followup OT needed. Has PRN assist at discharge.     OT Assessment  Patient does not need any further OT services    Follow Up Recommendations  No OT follow up    Barriers to Discharge      Equipment Recommendations  3 in 1 bedside comode;Rolling walker with 5" wheels;Other (comment);Tub/shower bench (tubbench if covered and elongated 3in1)    Recommendations for Other Services    Frequency       Precautions / Restrictions Precautions Precautions: None Restrictions Weight Bearing Restrictions: No RLE Weight Bearing: Weight bearing as tolerated        ADL  Eating/Feeding: Simulated;Independent Where Assessed - Eating/Feeding: Edge of bed Grooming: Simulated;Supervision/safety Where Assessed - Grooming: Unsupported standing Upper Body Bathing: Simulated;Chest;Right arm;Abdomen;Left arm;Set up Where Assessed - Upper Body Bathing: Unsupported sitting Lower Body Bathing: Minimal assistance;Simulated Where Assessed - Lower Body Bathing: Supported sit to stand Upper Body Dressing: Simulated;Set up Where Assessed - Upper Body Dressing: Unsupported sitting Lower Body Dressing: Simulated;Minimal assistance;Other (comment) (with socks, shoes. pt has a shoe horn and sock aid at home) Where Assessed - Lower Body Dressing: Sopported sit to stand Toilet Transfer: Performed;Supervision/safety Toilet Transfer Method: Other (comment) (ambulating) Toilet Transfer Equipment: Comfort height toilet;Grab bars;Raised toilet seat with arms (or 3-in-1 over toilet) Toileting - Clothing Manipulation and Hygiene: Simulated;Supervision/safety Where Assessed - Glass blower/designer Manipulation and Hygiene:  Standing Tub/Shower Transfer Method: Not assessed Equipment Used: Rolling walker;Reacher;Long-handled sponge ADL Comments: Pt has shoe horn and sock aid and reports knowing how to use them. Educated him on benefits of a reacher and LHS as pt inquring and also explained where to obtain. Pt asking if a Mary Sella is covered and informed nursing who will pass along to case manager to check if it is covered. Pt reports knowing how to transfer on and off of tubbench already. Assessed comfort height commode versus 3in1 for home. Pt found up in  bathroom without RW. Cautioned him to use RW right now  for safety. PT to assess RW versus cane next session (see PT note). Cued him to use RW to back up to toilet and pt wanting to use grab bar and RW to help with guiding to sitting and with standing. He feels he would like to have 3in1 armrests to help boost up ande descend better. Agree  that 3in1 would be beneficial as patient trying to turn RW to the side to use to push up to standing which isnt safe. No further questions by patient. He states he has assist at home PRN also.     OT Diagnosis:    OT Problem List:   OT Treatment Interventions:     OT Goals    Visit Information  Last OT Received On: 08/26/11 Assistance Needed: +1    Subjective Data  Subjective: I think I may need one of those commode chairs Patient Stated Goal: home   Prior Functioning  Home Living Lives With: Spouse Available Help at Discharge: Family Type of Home: House Home Access: Stairs to enter [employer] of Steps: 6-7 Entrance Stairs-Rails: Right Home Layout: One level Bathroom Shower/Tub: Engineer, manufacturing systems: Handicapped height Home Adaptive Equipment: Long-handled shoehorn;Sock aid Prior Function Level of Independence: Independent  Able to Take Stairs?: Yes Driving: Yes Vocation: Retired Musician: No difficulties    Cognition  Overall Cognitive Status: Appears within  functional limits for tasks assessed/performed (see below) Arousal/Alertness: Awake/alert Orientation Level: Appears intact for tasks assessed Behavior During Session: [facility] for tasks performed Cognition - Other Comments: although needing a reminder to use RW in room for safety as pt up without assistive device when OT entered and slightly unsteady.     Extremity/Trunk Assessment Right Upper Extremity Assessment RUE ROM/Strength/Tone: Within functional levels Left Upper Extremity Assessment LUE ROM/Strength/Tone: Within functional levels   Mobility Bed Mobility Bed Mobility: Not assessed Transfers Transfers: Sit to Stand;Stand to Sit Sit to Stand: 6: Modified independent (Device/Increase time);From bed Stand to Sit: 6: Modified independent (Device/Increase time);To chair/3-in-1   Exercise   Balance    End of Session OT - End of Session Activity Tolerance: Patient tolerated treatment well Patient left: in chair;with call bell/phone within reach   Lennox Laity [phone] 08/26/2011, 10:15 AM

## 2011-09-16 ENCOUNTER — Ambulatory Visit: Payer: 59 | Attending: Orthopedic Surgery

## 2011-09-16 DIAGNOSIS — M6281 Muscle weakness (generalized): Secondary | ICD-10-CM | POA: Diagnosis not present

## 2011-09-16 DIAGNOSIS — M25559 Pain in unspecified hip: Secondary | ICD-10-CM | POA: Diagnosis not present

## 2011-09-16 DIAGNOSIS — IMO0001 Reserved for inherently not codable concepts without codable children: Secondary | ICD-10-CM | POA: Insufficient documentation

## 2011-09-16 DIAGNOSIS — R262 Difficulty in walking, not elsewhere classified: Secondary | ICD-10-CM | POA: Insufficient documentation

## 2011-09-16 DIAGNOSIS — R269 Unspecified abnormalities of gait and mobility: Secondary | ICD-10-CM | POA: Diagnosis not present

## 2011-09-20 ENCOUNTER — Ambulatory Visit: Payer: 59 | Attending: Orthopedic Surgery | Admitting: Physical Therapy

## 2011-09-20 DIAGNOSIS — M6281 Muscle weakness (generalized): Secondary | ICD-10-CM | POA: Diagnosis not present

## 2011-09-20 DIAGNOSIS — M25559 Pain in unspecified hip: Secondary | ICD-10-CM | POA: Insufficient documentation

## 2011-09-20 DIAGNOSIS — IMO0001 Reserved for inherently not codable concepts without codable children: Secondary | ICD-10-CM | POA: Diagnosis not present

## 2011-09-20 DIAGNOSIS — R269 Unspecified abnormalities of gait and mobility: Secondary | ICD-10-CM | POA: Insufficient documentation

## 2011-09-20 DIAGNOSIS — R262 Difficulty in walking, not elsewhere classified: Secondary | ICD-10-CM | POA: Insufficient documentation

## 2011-09-21 ENCOUNTER — Ambulatory Visit (INDEPENDENT_AMBULATORY_CARE_PROVIDER_SITE_OTHER): Payer: 59 | Admitting: *Deleted

## 2011-09-21 DIAGNOSIS — Z7901 Long term (current) use of anticoagulants: Secondary | ICD-10-CM

## 2011-09-21 DIAGNOSIS — I2699 Other pulmonary embolism without acute cor pulmonale: Secondary | ICD-10-CM | POA: Diagnosis not present

## 2011-09-27 ENCOUNTER — Ambulatory Visit: Payer: 59 | Admitting: Physical Therapy

## 2011-09-28 ENCOUNTER — Ambulatory Visit (INDEPENDENT_AMBULATORY_CARE_PROVIDER_SITE_OTHER): Payer: 59 | Admitting: *Deleted

## 2011-09-28 DIAGNOSIS — I2699 Other pulmonary embolism without acute cor pulmonale: Secondary | ICD-10-CM | POA: Diagnosis not present

## 2011-09-28 DIAGNOSIS — Z7901 Long term (current) use of anticoagulants: Secondary | ICD-10-CM

## 2011-09-30 ENCOUNTER — Ambulatory Visit: Payer: 59 | Admitting: Physical Therapy

## 2011-10-04 ENCOUNTER — Ambulatory Visit: Payer: 59 | Admitting: Physical Therapy

## 2011-10-06 DIAGNOSIS — M169 Osteoarthritis of hip, unspecified: Secondary | ICD-10-CM | POA: Diagnosis not present

## 2011-10-07 ENCOUNTER — Ambulatory Visit: Payer: 59 | Admitting: Physical Therapy

## 2011-10-12 ENCOUNTER — Ambulatory Visit (INDEPENDENT_AMBULATORY_CARE_PROVIDER_SITE_OTHER): Payer: 59 | Admitting: Pharmacist

## 2011-10-12 DIAGNOSIS — Z7901 Long term (current) use of anticoagulants: Secondary | ICD-10-CM

## 2011-10-12 DIAGNOSIS — I2699 Other pulmonary embolism without acute cor pulmonale: Secondary | ICD-10-CM

## 2011-10-18 ENCOUNTER — Ambulatory Visit: Payer: 59 | Admitting: Physical Therapy

## 2011-10-21 ENCOUNTER — Ambulatory Visit: Payer: 59 | Attending: Orthopedic Surgery | Admitting: Physical Therapy

## 2011-10-21 DIAGNOSIS — R269 Unspecified abnormalities of gait and mobility: Secondary | ICD-10-CM | POA: Diagnosis not present

## 2011-10-21 DIAGNOSIS — M25559 Pain in unspecified hip: Secondary | ICD-10-CM | POA: Diagnosis not present

## 2011-10-21 DIAGNOSIS — R262 Difficulty in walking, not elsewhere classified: Secondary | ICD-10-CM | POA: Diagnosis not present

## 2011-10-21 DIAGNOSIS — IMO0001 Reserved for inherently not codable concepts without codable children: Secondary | ICD-10-CM | POA: Insufficient documentation

## 2011-10-21 DIAGNOSIS — M6281 Muscle weakness (generalized): Secondary | ICD-10-CM | POA: Diagnosis not present

## 2011-10-25 ENCOUNTER — Encounter: Payer: 59 | Admitting: Physical Therapy

## 2011-10-28 ENCOUNTER — Encounter: Payer: 59 | Admitting: Physical Therapy

## 2011-11-05 ENCOUNTER — Ambulatory Visit (INDEPENDENT_AMBULATORY_CARE_PROVIDER_SITE_OTHER): Payer: 59

## 2011-11-05 DIAGNOSIS — Z7901 Long term (current) use of anticoagulants: Secondary | ICD-10-CM

## 2011-11-05 DIAGNOSIS — I2699 Other pulmonary embolism without acute cor pulmonale: Secondary | ICD-10-CM

## 2011-11-05 LAB — POCT INR: INR: 3.2

## 2011-11-05 MED ORDER — WARFARIN SODIUM 5 MG PO TABS: ORAL_TABLET | ORAL | Status: DC

## 2011-11-17 DIAGNOSIS — Z96649 Presence of unspecified artificial hip joint: Secondary | ICD-10-CM | POA: Diagnosis not present

## 2011-11-19 DIAGNOSIS — E559 Vitamin D deficiency, unspecified: Secondary | ICD-10-CM | POA: Diagnosis not present

## 2011-11-19 DIAGNOSIS — R7309 Other abnormal glucose: Secondary | ICD-10-CM | POA: Diagnosis not present

## 2011-11-19 DIAGNOSIS — I10 Essential (primary) hypertension: Secondary | ICD-10-CM | POA: Diagnosis not present

## 2011-11-19 DIAGNOSIS — E782 Mixed hyperlipidemia: Secondary | ICD-10-CM | POA: Diagnosis not present

## 2011-11-26 ENCOUNTER — Ambulatory Visit (INDEPENDENT_AMBULATORY_CARE_PROVIDER_SITE_OTHER): Payer: 59

## 2011-11-26 DIAGNOSIS — I2699 Other pulmonary embolism without acute cor pulmonale: Secondary | ICD-10-CM | POA: Diagnosis not present

## 2011-11-26 DIAGNOSIS — Z7901 Long term (current) use of anticoagulants: Secondary | ICD-10-CM | POA: Diagnosis not present

## 2011-11-26 LAB — POCT INR: INR: 2.9

## 2011-12-24 ENCOUNTER — Ambulatory Visit (INDEPENDENT_AMBULATORY_CARE_PROVIDER_SITE_OTHER): Payer: 59 | Admitting: *Deleted

## 2011-12-24 DIAGNOSIS — Z7901 Long term (current) use of anticoagulants: Secondary | ICD-10-CM | POA: Diagnosis not present

## 2011-12-24 DIAGNOSIS — I2699 Other pulmonary embolism without acute cor pulmonale: Secondary | ICD-10-CM

## 2012-01-20 ENCOUNTER — Ambulatory Visit (INDEPENDENT_AMBULATORY_CARE_PROVIDER_SITE_OTHER): Payer: 59 | Admitting: *Deleted

## 2012-01-20 DIAGNOSIS — Z7901 Long term (current) use of anticoagulants: Secondary | ICD-10-CM

## 2012-01-20 DIAGNOSIS — I2699 Other pulmonary embolism without acute cor pulmonale: Secondary | ICD-10-CM

## 2012-01-20 LAB — POCT INR: INR: 2.8

## 2012-02-14 DIAGNOSIS — E782 Mixed hyperlipidemia: Secondary | ICD-10-CM | POA: Diagnosis not present

## 2012-02-14 DIAGNOSIS — E559 Vitamin D deficiency, unspecified: Secondary | ICD-10-CM | POA: Diagnosis not present

## 2012-02-14 DIAGNOSIS — R7309 Other abnormal glucose: Secondary | ICD-10-CM | POA: Diagnosis not present

## 2012-02-14 DIAGNOSIS — I10 Essential (primary) hypertension: Secondary | ICD-10-CM | POA: Diagnosis not present

## 2012-03-06 ENCOUNTER — Ambulatory Visit (INDEPENDENT_AMBULATORY_CARE_PROVIDER_SITE_OTHER): Payer: 59 | Admitting: *Deleted

## 2012-03-06 DIAGNOSIS — I2699 Other pulmonary embolism without acute cor pulmonale: Secondary | ICD-10-CM

## 2012-03-06 DIAGNOSIS — Z7901 Long term (current) use of anticoagulants: Secondary | ICD-10-CM

## 2012-03-06 LAB — POCT INR: INR: 2.6

## 2012-04-17 ENCOUNTER — Ambulatory Visit (INDEPENDENT_AMBULATORY_CARE_PROVIDER_SITE_OTHER): Payer: 59 | Admitting: *Deleted

## 2012-04-17 DIAGNOSIS — I2699 Other pulmonary embolism without acute cor pulmonale: Secondary | ICD-10-CM

## 2012-04-17 DIAGNOSIS — Z7901 Long term (current) use of anticoagulants: Secondary | ICD-10-CM | POA: Diagnosis not present

## 2012-05-11 ENCOUNTER — Encounter: Payer: Self-pay | Admitting: Cardiovascular Disease

## 2012-05-11 ENCOUNTER — Other Ambulatory Visit: Payer: Self-pay

## 2012-05-11 MED ORDER — WARFARIN SODIUM 5 MG PO TABS: ORAL_TABLET | ORAL | Status: DC

## 2012-05-17 ENCOUNTER — Other Ambulatory Visit (HOSPITAL_COMMUNITY): Payer: Self-pay | Admitting: Internal Medicine

## 2012-05-17 ENCOUNTER — Ambulatory Visit (HOSPITAL_COMMUNITY)
Admission: RE | Admit: 2012-05-17 | Discharge: 2012-05-17 | Disposition: A | Discharge status: Home / Self Care | Payer: 59 | Source: Ambulatory Visit | Attending: Internal Medicine | Admitting: Internal Medicine

## 2012-05-17 DIAGNOSIS — I10 Essential (primary) hypertension: Secondary | ICD-10-CM

## 2012-05-17 DIAGNOSIS — Z86711 Personal history of pulmonary embolism: Secondary | ICD-10-CM | POA: Insufficient documentation

## 2012-05-17 DIAGNOSIS — Z0000 Encounter for general adult medical examination without abnormal findings: Secondary | ICD-10-CM | POA: Insufficient documentation

## 2012-05-19 ENCOUNTER — Other Ambulatory Visit: Payer: Self-pay | Admitting: Cardiovascular Disease

## 2012-05-29 ENCOUNTER — Ambulatory Visit (INDEPENDENT_AMBULATORY_CARE_PROVIDER_SITE_OTHER): Payer: 59

## 2012-05-29 DIAGNOSIS — I2699 Other pulmonary embolism without acute cor pulmonale: Secondary | ICD-10-CM

## 2012-05-29 DIAGNOSIS — Z7901 Long term (current) use of anticoagulants: Secondary | ICD-10-CM | POA: Diagnosis not present

## 2012-06-15 ENCOUNTER — Ambulatory Visit (INDEPENDENT_AMBULATORY_CARE_PROVIDER_SITE_OTHER): Payer: 59 | Admitting: Cardiovascular Disease

## 2012-06-15 ENCOUNTER — Encounter: Payer: Self-pay | Admitting: Cardiovascular Disease

## 2012-06-15 VITALS — BP 134/77 | HR 71 | Ht 72.0 in | Wt 221.0 lb

## 2012-06-15 DIAGNOSIS — I2782 Chronic pulmonary embolism: Secondary | ICD-10-CM | POA: Diagnosis not present

## 2012-06-15 DIAGNOSIS — I2510 Atherosclerotic heart disease of native coronary artery without angina pectoris: Secondary | ICD-10-CM

## 2012-06-15 DIAGNOSIS — I340 Nonrheumatic mitral (valve) insufficiency: Secondary | ICD-10-CM

## 2012-06-15 DIAGNOSIS — I059 Rheumatic mitral valve disease, unspecified: Secondary | ICD-10-CM

## 2012-06-15 NOTE — Patient Instructions (Addendum)
Your physician wants you to follow-up in:  12 months.  You will receive a reminder letter in the mail two months in advance. If you don't receive a letter, please call our office to schedule the follow-up appointment.  Your physician has requested that you have an echocardiogram. Echocardiography is a painless test that uses sound waves to create images of your heart. It provides your doctor with information about the size and shape of your heart and how well your heart's chambers and valves are working. This procedure takes approximately one hour. There are no restrictions for this procedure.   Your physician has recommended you make the following change in your medication:  Stop Aspirin

## 2012-06-15 NOTE — Progress Notes (Signed)
History of Present Illness: 67 yo Griffin with h/o HTN, hyperlipidemia, non-obstructive CAD by cath 8/09 and mild LV dysfunction who had a hip replacement 9/09 and presented with SOB 3/10 and was found to have bilateral pulmonary emboli. Repeat CT chest 11/14/08 with no evidence of PE. Lower extremity venous dopplers 9/10 with no evidence of DVT. Coumadin therapy was stopped 9/10. Echo 11/28/08 with normal LV size and systolic function with grade 2 diastolic dysfunction and mild to moderate MR, trivial AI. Readmitted to [facility] October 31, 2010 with SOB and found to have recurrent Pulmonary Embolism by CTA. He was restarted on coumadin in the hospital. He has been maintained on coumadin since his last visit in our office.  Echo 10/31/10 with normal LV size and function, moderate MR.   He is here today for cardiac follow up. No change in breathing. No chest pain. No bleeding issues with coumadin.   Primary Care Physician: Oneta Rack  Last Lipid Profile: Followed in primary care    Component Value Date/Time   CHOL  Value: 158        ATP III CLASSIFICATION:  <200     mg/dL   Desirable  621-308  mg/dL   Borderline High  >=657    mg/dL   High        10/23/6960 0420   TRIG 115 05/26/2008 0420   HDL 30* 05/26/2008 0420   CHOLHDL 5.3 05/26/2008 0420   VLDL 23 05/26/2008 0420   LDLCALC  Value: 105        Total Cholesterol/HDL:CHD Risk Coronary Heart Disease Risk Table                     Men   Women  1/2 Average Risk   3.4   3.3  Average Risk       5.0   4.4  2 X Average Risk   9.6   7.1  3 X Average Risk  23.4   11.0        Use the calculated Patient Ratio above and the CHD Risk Table to determine the patient's CHD Risk.        ATP III CLASSIFICATION (LDL):  <100     mg/dL   Optimal  952-841  mg/dL   Near or Above                    Optimal  130-159  mg/dL   Borderline  324-401  mg/dL   High  >027     mg/dL   Very High* 04/26/3662 4034     Past Medical History  Diagnosis Date  . Hyperlipidemia   . Seasonal allergies     . Osteoarthritis   . Pulmonary embolism     march 2010, 2012              /   DVT x  2  LEFT 2010  . Coronary artery disease     non-obstructive by cath 2009  . Heart murmur   . Pre-diabetes   . Hypertension     Clearance with note Dr Marvel Plan on chart,  Lovonox bridging  ordered by Dr Sanjuana Kava    EKG 9/12, chst 9/12 EPIC  . Peripheral vascular disease   . Sleep apnea     STOP BANG SCORE 4    Past Surgical History  Procedure Laterality Date  . Achillis tendon      repair 20 years ago  . Hip arthroplasty  11/06/07  . Total hip arthroplasty  08/24/2011    Procedure: TOTAL HIP ARTHROPLASTY ANTERIOR APPROACH;  Surgeon: Shelda Pal, MD;  Location: [facility] ORS;  Service: Orthopedics;  Laterality: Right;    Current Outpatient Prescriptions  Medication Sig Dispense Refill  . aspirin 81 MG tablet Take 81 mg by mouth daily.       . benazepril (LOTENSIN) 20 MG tablet Take 10 mg by mouth every evening. Takes 1/2 tablet to equal 10mg       . Cholecalciferol (VITAMIN D3) 5000 UNITS TABS Take 1 tablet by mouth daily.      . fenofibrate micronized (LOFIBRA) 134 MG capsule Take 134 mg by mouth every evening.       . fexofenadine (ALLEGRA) 30 MG tablet Take 30 mg by mouth daily.      . Flaxseed, Linseed, (FLAX SEED OIL) 1000 MG CAPS Take 1,000 mg by mouth 2 (two) times daily.      Marland Kitchen guaiFENesin (MUCINEX) 600 MG 12 hr tablet Take 600 mg by mouth as needed.       . warfarin (COUMADIN) 5 MG tablet Take as directed by anticoagulation clinic  145 tablet  1  . warfarin (COUMADIN) 5 MG tablet TAKE BY MOUTH AS DIRECTED BY ANTICOAGULATION CLINIC  145 tablet  1  . [DISCONTINUED] loratadine (CLARITIN) 10 MG tablet Take 10 mg by mouth daily.         No current facility-administered medications for this visit.    Allergies  Allergen Reactions  . Statins     REACTION: muscle aches    History   Social History  . Marital Status: Married    Spouse Name: N/A    Number of Children: N/A  . Years of  Education: N/A   Occupational History  . dental hygeinist    Social History Main Topics  . Smoking status: Former Smoker    Types: Cigars    Quit date: 08/19/1947  . Smokeless tobacco: Current User    Types: Chew  . Alcohol Use: No  . Drug Use: No  . Sexually Active: Not on file   Other Topics Concern  . Not on file   Social History Narrative  . No narrative on file    Family History  Problem Relation Age of Onset  . Coronary artery disease    . Heart attack Father   . Heart attack Brother 60    Review of Systems:  As stated in the HPI and otherwise negative.   BP 134/77  Pulse 71  Ht 6' (1.829 m)  Wt 221 lb (100.245 kg)  BMI 29.97 kg/m2  Physical Examination: General: Well developed, well nourished, NAD HEENT: OP clear, mucus membranes moist SKIN: warm, dry. No rashes. Neuro: No focal deficits Musculoskeletal: Muscle strength 5/5 all ext Psychiatric: Mood and affect normal Neck: No JVD, no carotid bruits, no thyromegaly, no lymphadenopathy. Lungs:Clear bilaterally, no wheezes, rhonci, crackles Cardiovascular: Regular rate and rhythm. Loud systolic murmur. No gallops or rubs. Abdomen:Soft. Bowel sounds present. Non-tender.  Extremities: No lower extremity edema. Pulses are 2 + in the bilateral DP/PT.  Carotid artery dopplers April 2013: No evidence of carotid artery disease.   Assessment and Plan:   1. MITRAL REGURGITATION: Moderate MR by echo August 2012. Will repeat echo.     2. PE:  He will need coumadin for lifetime. No change in SOB. No need to repeat CTA. He is followed in our coumadin clinic.   3. Right carotid bruit: No carotid disease  by dopplers 4/13.   4. CAD, NATIVE VESSEL: Stable. No changes. Will get EKG faxed over from primary care.

## 2012-06-20 ENCOUNTER — Ambulatory Visit (HOSPITAL_COMMUNITY): Payer: 59 | Attending: Cardiology

## 2012-06-20 DIAGNOSIS — I10 Essential (primary) hypertension: Secondary | ICD-10-CM | POA: Insufficient documentation

## 2012-06-20 DIAGNOSIS — Z86711 Personal history of pulmonary embolism: Secondary | ICD-10-CM | POA: Diagnosis not present

## 2012-06-20 DIAGNOSIS — I739 Peripheral vascular disease, unspecified: Secondary | ICD-10-CM | POA: Diagnosis not present

## 2012-06-20 DIAGNOSIS — R0609 Other forms of dyspnea: Secondary | ICD-10-CM | POA: Insufficient documentation

## 2012-06-20 DIAGNOSIS — I059 Rheumatic mitral valve disease, unspecified: Secondary | ICD-10-CM | POA: Diagnosis not present

## 2012-06-20 DIAGNOSIS — E785 Hyperlipidemia, unspecified: Secondary | ICD-10-CM | POA: Diagnosis not present

## 2012-06-20 DIAGNOSIS — Z87891 Personal history of nicotine dependence: Secondary | ICD-10-CM | POA: Diagnosis not present

## 2012-06-20 DIAGNOSIS — I2510 Atherosclerotic heart disease of native coronary artery without angina pectoris: Secondary | ICD-10-CM | POA: Diagnosis not present

## 2012-06-20 DIAGNOSIS — R0989 Other specified symptoms and signs involving the circulatory and respiratory systems: Secondary | ICD-10-CM | POA: Insufficient documentation

## 2012-06-20 DIAGNOSIS — I340 Nonrheumatic mitral (valve) insufficiency: Secondary | ICD-10-CM

## 2012-06-20 NOTE — Progress Notes (Signed)
Echocardiogram performed.  

## 2012-06-27 ENCOUNTER — Encounter: Payer: Self-pay | Admitting: Cardiovascular Disease

## 2012-06-27 ENCOUNTER — Ambulatory Visit (INDEPENDENT_AMBULATORY_CARE_PROVIDER_SITE_OTHER): Payer: 59 | Admitting: Cardiovascular Disease

## 2012-06-27 ENCOUNTER — Encounter: Payer: Self-pay | Admitting: *Deleted

## 2012-06-27 VITALS — BP 123/75 | HR 74 | Ht 72.0 in | Wt 219.0 lb

## 2012-06-27 DIAGNOSIS — I2510 Atherosclerotic heart disease of native coronary artery without angina pectoris: Secondary | ICD-10-CM | POA: Diagnosis not present

## 2012-06-27 DIAGNOSIS — I059 Rheumatic mitral valve disease, unspecified: Secondary | ICD-10-CM

## 2012-06-27 DIAGNOSIS — I340 Nonrheumatic mitral (valve) insufficiency: Secondary | ICD-10-CM

## 2012-06-27 LAB — BASIC METABOLIC PANEL
BUN: 20 mg/dL (ref 6–23)
GFR: 65.24 mL/min (ref >60.00)
Glucose, Bld: 85 mg/dL (ref 70–99)
Potassium: 4 mEq/L (ref 3.5–5.1)

## 2012-06-27 LAB — CBC WITH DIFFERENTIAL/PLATELET
Basophils Absolute: 0 10*3/uL (ref 0.0–0.1)
HCT: 39.9 % (ref 39.0–52.0)
Lymphs Abs: 1.9 10*3/uL (ref 0.7–4.0)
Monocytes Absolute: 0.4 10*3/uL (ref 0.1–1.0)
Monocytes Relative: 8.9 % (ref 3.0–12.0)
Platelets: 234 10*3/uL (ref 150.0–400.0)
RDW: 14.1 % (ref 11.5–14.6)

## 2012-06-27 LAB — PROTIME-INR: Prothrombin Time: 22.1 s — ABNORMAL HIGH (ref 10.2–12.4)

## 2012-06-27 NOTE — Progress Notes (Signed)
 History of Present Illness: 67 yo AAM with h/o HTN, hyperlipidemia, non-obstructive CAD by cath 8/09 and mild LV dysfunction who had a hip replacement 9/09 and presented with SOB 3/10 and was found to have bilateral pulmonary emboli. Repeat CT chest 11/14/08 with no evidence of PE. Lower extremity venous dopplers 9/10 with no evidence of DVT. Coumadin therapy was stopped 9/10. Echo 11/28/08 with normal LV size and systolic function with grade 2 diastolic dysfunction and mild to moderate MR, trivial AI. Readmitted to [facility] October 31, 2010 with SOB and found to have recurrent Pulmonary Embolism by CTA. He was restarted on coumadin in the hospital. He has been maintained on coumadin since then. Echo 10/31/10 with normal LV size and function, moderate MR. Repeat echo showed dilated LV, normal LV function, severe MR secondary to flail posterior MV leaflet.   He is here today for cardiac follow up. No change in breathing. He does note dyspnea with minimal exertion. No chest pain. No bleeding issues with coumadin.   Primary Care Physician: McKeown  Last Lipid Profile: Followed in primary care. Scanned in EPIC.    Past Medical History  Diagnosis Date  . Hyperlipidemia   . Seasonal allergies   . Osteoarthritis   . Pulmonary embolism     march 2010, 2012              /   DVT x  2  LEFT 2010  . Coronary artery disease     non-obstructive by cath 2009  . Heart murmur   . Pre-diabetes   . Hypertension     Clearance with note Dr McGowan on chart,  Lovonox bridging  ordered by Dr Mcalhaney    EKG 9/12, chst 9/12 [facility]  . Peripheral vascular disease   . Sleep apnea     STOP BANG SCORE 4    Past Surgical History  Procedure Laterality Date  . Achillis tendon      repair 20 years ago  . Hip arthroplasty      11/06/07  . Total hip arthroplasty  08/24/2011    Procedure: TOTAL HIP ARTHROPLASTY ANTERIOR APPROACH;  Surgeon: Matthew D Olin, MD;  Location: [facility];  Service: Orthopedics;  Laterality: Right;     Current Outpatient Prescriptions  Medication Sig Dispense Refill  . benazepril (LOTENSIN) 20 MG tablet Take 10 mg by mouth every evening. Takes 1/2 tablet to equal 10mg      . Cholecalciferol (VITAMIN D3) 5000 UNITS TABS Take 2 tablets by mouth daily.       . fenofibrate micronized (LOFIBRA) 134 MG capsule Take 134 mg by mouth every evening.       . fexofenadine (ALLEGRA) 30 MG tablet Take 30 mg by mouth daily.      . Flaxseed, Linseed, (FLAX SEED OIL) 1000 MG CAPS Take 1,000 mg by mouth 2 (two) times daily.      . guaiFENesin (MUCINEX) 600 MG 12 hr tablet Take 600 mg by mouth as needed.       . warfarin (COUMADIN) 5 MG tablet TAKE BY MOUTH AS DIRECTED BY ANTICOAGULATION CLINIC  145 tablet  1  . [DISCONTINUED] loratadine (CLARITIN) 10 MG tablet Take 10 mg by mouth daily.         No current facility-administered medications for this visit.    Allergies  Allergen Reactions  . Statins     REACTION: muscle aches    History   Social History  . Marital Status: Married      Spouse Name: N/A    Number of Children: N/A  . Years of Education: N/A   Occupational History  . dental hygeinist    Social History Main Topics  . Smoking status: Former Smoker    Types: Cigars    Quit date: 08/19/1947  . Smokeless tobacco: Current User    Types: Chew  . Alcohol Use: No  . Drug Use: No  . Sexually Active: Not on file   Other Topics Concern  . Not on file   Social History Narrative  . No narrative on file    Family History  Problem Relation Age of Onset  . Coronary artery disease    . Heart attack Father   . Heart attack Brother 60    Review of Systems:  As stated in the HPI and otherwise negative.   BP 123/75  Pulse 74  Ht 6' (1.829 m)  Wt 219 lb (99.338 kg)  BMI 29.7 kg/m2  Physical Examination: General: Well developed, well nourished, NAD HEENT: OP clear, mucus membranes moist SKIN: warm, dry. No rashes. Neuro: No focal deficits Musculoskeletal: Muscle strength  5/5 all ext Psychiatric: Mood and affect normal Neck: No JVD, no carotid bruits, no thyromegaly, no lymphadenopathy. Lungs:Clear bilaterally, no wheezes, rhonci, crackles Cardiovascular: Regular rate and rhythm. Loud systolic murmur. No gallops or rubs. Abdomen:Soft. Bowel sounds present. Non-tender.  Extremities: No lower extremity edema. Pulses are 2 + in the bilateral DP/PT.  EKG: Sinus, PVCs. LVH.   Echo 06/20/12: Left ventricle: The cavity size was moderately dilated. Wall thickness was increased in a pattern of mild LVH. Systolic function was normal. The estimated ejection fraction was in the range of 60% to 65%. Wall motion was normal; there were no regional wall motion abnormalities. - Mitral valve: The is flail of the posterior leaflet with very severe MR. - Left atrium: The atrium was moderately dilated. - Pulmonary arteries: PA peak pressure: 32mm Hg (S). - Impressions: SINCE 10/2010, THERE IS NOW FLAIL OF THE POSTERIOR MITRAL LEAFLET WITH SEVERE MR. Impressions:  - SINCE 10/2010, THERE IS NOW FLAIL OF THE POSTERIOR MITRAL LEAFLET WITH SEVERE MR.  Assessment and Plan:   1. MITRAL REGURGITATION: Now severe by echo 06/20/12 with flail leaflet. LV is dilated. LV function is normal. Will need TEE to better assess. Will arrange on 07/04/12 at [facility]. Will refer to see Dr. Owen in CT surgery clinic after his TEE. Right and left heart cath 07/12/12 to exclude progression of CAD and measure pressurs. Pre procedure labs today. Will hold coumadin 5 days before cath and have coumadin clinic help arrange bridging with Lovenox.   2. PE: He will need coumadin for lifetime. Will bridge with Lovenox for cath.  .  3. Right carotid bruit: No carotid disease by dopplers 4/13.   4. CAD, NATIVE VESSEL: Stable. No changes.   

## 2012-06-27 NOTE — Patient Instructions (Addendum)
Your physician has requested that you have a TEE. During a TEE, sound waves are used to create images of your heart. It provides your doctor with information about the size and shape of your heart and how well your heart's chambers and valves are working. In this test, a transducer is attached to the end of a flexible tube that's guided down your throat and into your esophagus (the tube leading from you mouth to your stomach) to get a more detailed image of your heart. You are not awake for the procedure. Please see the instruction sheet given to you today. For further information please visit [email or web address] Scheduled for July 04, 2012 with Dr. Shirlee Latch    Your physician has requested that you have a cardiac catheterization. Cardiac catheterization is used to diagnose and/or treat various heart conditions. Doctors may recommend this procedure for a number of different reasons. The most common reason is to evaluate chest pain. Chest pain can be a symptom of coronary artery disease (CAD), and cardiac catheterization can show whether plaque is narrowing or blocking your heart's arteries. This procedure is also used to evaluate the valves, as well as measure the blood flow and oxygen levels in different parts of your heart. For further information please visit [email or web address] Please follow instruction sheet, as given. Scheduled for July 12, 2012 with Dr. Clifton James   Transesophageal Echocardiography A transesophageal echocardiogram (TEE) is a special type of test that produces images of the heart by sound waves (echocardiogram). This type of echocardiogram can obtain better images of the heart than a standard echocardiogram. A TEE is done by passing a flexible tube down the esophagus. The heart is located in front of the esophagus. Because the heart and esophagus are close to one another, your caregiver can take very clear, detailed pictures of the heart via ultrasound waves. WHY HAVE A TEE? Your  caregiver may need more information based on your medical condition. A TEE is usually performed due to the following:  Your caregiver needs more information based on standard echocardiogram findings.  If you had a stroke, this might have happened because a clot formed in your heart. A TEE can visualize different areas of the heart and check for clots.  To check valve anatomy and function. Your caregiver will especially look at the mitral valve.  To check for redness, soreness, and swelling (inflammation) on the inside lining of the heart (endocarditis).  To evaluate the dividing wall (septum) of the heart and presence of a hole that did not close after birth (patent foramen ovale, PFO).  To help diagnose a tear in the wall of the aorta (aortic dissection).  During cardiac valve surgery, a TEE probe is placed. This allows the surgeon to assess the valve repair before closing the chest. LET YOUR CAREGIVER KNOW ABOUT:   Swallowing difficulties.  An esophageal obstruction.  Use of aspirin or antiplatelet therapy. RISKS AND COMPLICATIONS  Though extremely rare, an esophageal tear (rupture) is a potential complication. BEFORE THE PROCEDURE   Arrive at least 1 hour before the procedure or as told by your caregiver.  Do not eat or drink for 6 hours before the procedure or as told by your caregiver.  An intravenous (IV) access tube will be started in the arm. PROCEDURE   A medicine to help you relax (sedative) will be given through the IV.  A medicine that numbs the area (local anesthetic) may be sprayed to the back of the throat.  Your blood pressure, heart rate,  and breathing (vital signs) will be monitored during the procedure.  The TEE probe is a long, flexible tube. It is about the width of an adult male's index finger. The tip of the probe is placed into the back of the mouth and you will be asked to swallow. This helps to pass the tip of the probe into the esophagus. Once the tip  of the probe is in the correct area, your caregiver can take pictures of the heart.  A TEE is usually not a painful procedure. You may feel the probe press against the back of the throat. The probe does not enter the trachea and does not affect your breathing.  Your time spent at the hospital is usually less than 2 hours. AFTER THE PROCEDURE   You will be in bed, resting until you have fully returned to consciousness.  When you first awaken, your throat may feel slightly sore and will probably still feel numb. This will improve slowly over time.  You will not be allowed to eat or drink until it is clear that numbness has improved.  Once you have been able to drink, urinate, and sit on the edge of the bed without feeling sick to your stomach (nauseous) or dizzy, you may be cleared to dress and go home.  Do not drive yourself home. You have had medications that can continue to make you feel drowsy and can impair your reflexes.  You should have a friend or family member with you for the next 24 hours after your examination. Obtaining the test results It is your responsibility to obtain your test results. Ask the lab or department performing the test when and how you will get your results. SEEK IMMEDIATE MEDICAL CARE IF:   There is chest pain.  You have a hard time breathing or have shortness of breath.  You cough or throw up (vomit) blood. MAKE SURE YOU:   Understand these instructions.  Will watch this condition.  Will get help right away if you is not doing well or gets worse. Document Released: 05/29/2002 Document Revised: 05/31/2011 Document Reviewed: 08/20/2008 [facility] Patient Information 2013 [place], [place].    Coronary Angiography Coronary angiography is an X-ray procedure used to look at the arteries in the heart. In this procedure, a dye is injected through a long, hollow tube (catheter). The catheter is about the size of a piece of cooked spaghetti. The catheter  injects a dye into an artery in your groin. X-rays are then taken to show if there is a blockage in the arteries of your heart. BEFORE THE PROCEDURE   Let your caregiver know if you have allergies to shellfish or contrast dye. Also let your caregiver know if you have kidney problems or failure.  Do not eat or drink starting from midnight up to the time of the procedure, or as directed.  You may drink enough water to take your medications the morning of the procedure if you were instructed to do so.  You should be at the hospital or outpatient facility where the procedure is to be done 60 minutes prior to the procedure or as directed. PROCEDURE  You may be given an IV medication to help you relax before the procedure.  You will be prepared for the procedure by washing and shaving the area where the catheter will be inserted. This is usually done in the groin but may be done in the fold of your arm by your elbow.  A medicine will be  given to numb your groin where the catheter will be inserted.  A specially trained doctor will insert the catheter into an artery in your groin. The catheter is guided by using a special type of X-ray (fluoroscopy) to the blood vessel being examined.  A special dye is then injected into the catheter and X-rays are taken. The dye helps to show where any narrowing or blockages are located in the heart arteries. AFTER THE PROCEDURE   After the procedure you will be kept in bed lying flat for several hours. You will be instructed to not bend or cross your legs.  The groin insertion site will be watched and checked frequently.  The pulse in your feet will be checked frequently.  Additional blood tests, X-rays and an EKG may be done.  You may stay in the hospital overnight for observation. SEEK IMMEDIATE MEDICAL CARE IF:   You develop chest pain, shortness of breath, feel faint, or pass out.  There is bleeding, swelling, or drainage from the catheter insertion  site.  You develop pain, discoloration, coldness, or severe bruising in the leg or area where the catheter was inserted.  You have a fever. Document Released: 09/12/2002 Document Revised: 05/31/2011 Document Reviewed: 11/01/2007 [facility] Patient Information 2013 [place], [place].   You have an appointment with the coumadin clinic in our office on July 06, 2012 at 9:30

## 2012-07-04 ENCOUNTER — Ambulatory Visit (HOSPITAL_COMMUNITY)
Admission: RE | Admit: 2012-07-04 | Discharge: 2012-07-04 | Disposition: A | Discharge status: Home / Self Care | Payer: 59 | Source: Ambulatory Visit | Attending: Cardiology | Admitting: Cardiology

## 2012-07-04 ENCOUNTER — Encounter (HOSPITAL_COMMUNITY)
Admission: RE | Disposition: A | Discharge status: Home / Self Care | Payer: Self-pay | Source: Ambulatory Visit | Attending: Cardiology

## 2012-07-04 ENCOUNTER — Encounter (HOSPITAL_COMMUNITY): Payer: Self-pay | Admitting: *Deleted

## 2012-07-04 DIAGNOSIS — I059 Rheumatic mitral valve disease, unspecified: Secondary | ICD-10-CM | POA: Insufficient documentation

## 2012-07-04 DIAGNOSIS — I340 Nonrheumatic mitral (valve) insufficiency: Secondary | ICD-10-CM

## 2012-07-04 DIAGNOSIS — I2510 Atherosclerotic heart disease of native coronary artery without angina pectoris: Secondary | ICD-10-CM | POA: Insufficient documentation

## 2012-07-04 DIAGNOSIS — Z86711 Personal history of pulmonary embolism: Secondary | ICD-10-CM | POA: Insufficient documentation

## 2012-07-04 DIAGNOSIS — Z7901 Long term (current) use of anticoagulants: Secondary | ICD-10-CM | POA: Diagnosis not present

## 2012-07-04 DIAGNOSIS — R0989 Other specified symptoms and signs involving the circulatory and respiratory systems: Secondary | ICD-10-CM | POA: Diagnosis not present

## 2012-07-04 HISTORY — PX: TEE WITHOUT CARDIOVERSION: SHX5443

## 2012-07-04 SURGERY — ECHOCARDIOGRAM, TRANSESOPHAGEAL
Anesthesia: Moderate Sedation

## 2012-07-04 MED ORDER — FENTANYL CITRATE 0.05 MG/ML IJ SOLN
INTRAMUSCULAR | Status: DC | PRN
  Administered 2012-07-04: 50 ug via INTRAVENOUS

## 2012-07-04 MED ORDER — SODIUM CHLORIDE 0.9 % IV SOLN
INTRAVENOUS | Status: DC
  Administered 2012-07-04: 500 mL via INTRAVENOUS

## 2012-07-04 MED ORDER — MIDAZOLAM HCL 10 MG/2ML IJ SOLN
INTRAMUSCULAR | Status: DC | PRN
  Administered 2012-07-04: 1 mg via INTRAVENOUS
  Administered 2012-07-04: 2 mg via INTRAVENOUS
  Administered 2012-07-04 (×2): 1 mg via INTRAVENOUS

## 2012-07-04 MED ORDER — MIDAZOLAM HCL 5 MG/ML IJ SOLN
INTRAMUSCULAR | Status: AC
  Filled 2012-07-04: qty 2

## 2012-07-04 MED ORDER — FENTANYL CITRATE 0.05 MG/ML IJ SOLN
INTRAMUSCULAR | Status: AC
  Filled 2012-07-04: qty 2

## 2012-07-04 MED ORDER — BUTAMBEN-TETRACAINE-BENZOCAINE 2-2-14 % EX AERO
INHALATION_SPRAY | CUTANEOUS | Status: DC | PRN
  Administered 2012-07-04: 2 via TOPICAL

## 2012-07-04 NOTE — Progress Notes (Signed)
  Echocardiogram Echocardiogram Transesophageal has been performed.  [name] 07/04/2012, 3:23 PM

## 2012-07-04 NOTE — CV Procedure (Signed)
Procedure: TEE  Indication: Severe MR  Sedation: Versed 5 mg IV, Fentanyl 50 mcg IV  Findings: Please see echo section for full report.  Mildly dilated LV with normal systolic function, EF 55%.  Severe MR with flail P2 segment.  PISA ERO 0.53 cm^2.   Pulmonary vein doppler interrogation shows systolic blunting but not reversal.  Normal RV size and systolic function.  Other valves look ok.    No complications.   Marca Ancona 07/04/2012 11:59 AM

## 2012-07-04 NOTE — H&P (View-Only) (Signed)
History of Present Illness: 67 yo AAM with h/o HTN, hyperlipidemia, non-obstructive CAD by cath 8/09 and mild LV dysfunction who had a hip replacement 9/09 and presented with SOB 3/10 and was found to have bilateral pulmonary emboli. Repeat CT chest 11/14/08 with no evidence of PE. Lower extremity venous dopplers 9/10 with no evidence of DVT. Coumadin therapy was stopped 9/10. Echo 11/28/08 with normal LV size and systolic function with grade 2 diastolic dysfunction and mild to moderate MR, trivial AI. Readmitted to [facility] October 31, 2010 with SOB and found to have recurrent Pulmonary Embolism by CTA. He was restarted on coumadin in the hospital. He has been maintained on coumadin since then. Echo 10/31/10 with normal LV size and function, moderate MR. Repeat echo showed dilated LV, normal LV function, severe MR secondary to flail posterior MV leaflet.   He is here today for cardiac follow up. No change in breathing. He does note dyspnea with minimal exertion. No chest pain. No bleeding issues with coumadin.   Primary Care Physician: Oneta Rack  Last Lipid Profile: Followed in primary care. Scanned in EPIC.    Past Medical History  Diagnosis Date  . Hyperlipidemia   . Seasonal allergies   . Osteoarthritis   . Pulmonary embolism     march 2010, 2012              /   DVT x  2  LEFT 2010  . Coronary artery disease     non-obstructive by cath 2009  . Heart murmur   . Pre-diabetes   . Hypertension     Clearance with note Dr Marvel Plan on chart,  Lovonox bridging  ordered by Dr Sanjuana Kava    EKG 9/12, chst 9/12 [facility]  . Peripheral vascular disease   . Sleep apnea     STOP BANG SCORE 4    Past Surgical History  Procedure Laterality Date  . Achillis tendon      repair 20 years ago  . Hip arthroplasty      11/06/07  . Total hip arthroplasty  08/24/2011    Procedure: TOTAL HIP ARTHROPLASTY ANTERIOR APPROACH;  Surgeon: Shelda Pal, MD;  Location: [facility];  Service: Orthopedics;  Laterality: Right;     Current Outpatient Prescriptions  Medication Sig Dispense Refill  . benazepril (LOTENSIN) 20 MG tablet Take 10 mg by mouth every evening. Takes 1/2 tablet to equal 10mg       . Cholecalciferol (VITAMIN D3) 5000 UNITS TABS Take 2 tablets by mouth daily.       . fenofibrate micronized (LOFIBRA) 134 MG capsule Take 134 mg by mouth every evening.       . fexofenadine (ALLEGRA) 30 MG tablet Take 30 mg by mouth daily.      . Flaxseed, Linseed, (FLAX SEED OIL) 1000 MG CAPS Take 1,000 mg by mouth 2 (two) times daily.      Marland Kitchen guaiFENesin (MUCINEX) 600 MG 12 hr tablet Take 600 mg by mouth as needed.       . warfarin (COUMADIN) 5 MG tablet TAKE BY MOUTH AS DIRECTED BY ANTICOAGULATION CLINIC  145 tablet  1  . [DISCONTINUED] loratadine (CLARITIN) 10 MG tablet Take 10 mg by mouth daily.         No current facility-administered medications for this visit.    Allergies  Allergen Reactions  . Statins     REACTION: muscle aches    History   Social History  . Marital Status: Married  Spouse Name: N/A    Number of Children: N/A  . Years of Education: N/A   Occupational History  . dental hygeinist    Social History Main Topics  . Smoking status: Former Smoker    Types: Cigars    Quit date: 08/19/1947  . Smokeless tobacco: Current User    Types: Chew  . Alcohol Use: No  . Drug Use: No  . Sexually Active: Not on file   Other Topics Concern  . Not on file   Social History Narrative  . No narrative on file    Family History  Problem Relation Age of Onset  . Coronary artery disease    . Heart attack Father   . Heart attack Brother 60    Review of Systems:  As stated in the HPI and otherwise negative.   BP 123/75  Pulse 74  Ht 6' (1.829 m)  Wt 219 lb (99.338 kg)  BMI 29.7 kg/m2  Physical Examination: General: Well developed, well nourished, NAD HEENT: OP clear, mucus membranes moist SKIN: warm, dry. No rashes. Neuro: No focal deficits Musculoskeletal: Muscle strength  5/5 all ext Psychiatric: Mood and affect normal Neck: No JVD, no carotid bruits, no thyromegaly, no lymphadenopathy. Lungs:Clear bilaterally, no wheezes, rhonci, crackles Cardiovascular: Regular rate and rhythm. Loud systolic murmur. No gallops or rubs. Abdomen:Soft. Bowel sounds present. Non-tender.  Extremities: No lower extremity edema. Pulses are 2 + in the bilateral DP/PT.  EKG: Sinus, PVCs. LVH.   Echo 06/20/12: Left ventricle: The cavity size was moderately dilated. Wall thickness was increased in a pattern of mild LVH. Systolic function was normal. The estimated ejection fraction was in the range of 60% to 65%. Wall motion was normal; there were no regional wall motion abnormalities. - Mitral valve: The is flail of the posterior leaflet with very severe MR. - Left atrium: The atrium was moderately dilated. - Pulmonary arteries: PA peak pressure: 32mm Hg (S). - Impressions: SINCE 10/2010, THERE IS NOW FLAIL OF THE POSTERIOR MITRAL LEAFLET WITH SEVERE MR. Impressions:  - SINCE 10/2010, THERE IS NOW FLAIL OF THE POSTERIOR MITRAL LEAFLET WITH SEVERE MR.  Assessment and Plan:   1. MITRAL REGURGITATION: Now severe by echo 06/20/12 with flail leaflet. LV is dilated. LV function is normal. Will need TEE to better assess. Will arrange on 07/04/12 at [facility]. Will refer to see Dr. Cornelius Moras in CT surgery clinic after his TEE. Right and left heart cath 07/12/12 to exclude progression of CAD and measure pressurs. Pre procedure labs today. Will hold coumadin 5 days before cath and have coumadin clinic help arrange bridging with Lovenox.   2. PE: He will need coumadin for lifetime. Will bridge with Lovenox for cath.  .  3. Right carotid bruit: No carotid disease by dopplers 4/13.   4. CAD, NATIVE VESSEL: Stable. No changes.

## 2012-07-04 NOTE — Interval H&P Note (Signed)
History and Physical Interval Note:  07/04/2012 11:25 AM  [name]  has presented today for surgery, with the diagnosis of mitral regurge  The various methods of treatment have been discussed with the patient and family. After consideration of risks, benefits and other options for treatment, the patient has consented to  Procedure(s): TRANSESOPHAGEAL ECHOCARDIOGRAM (TEE) (N/A) as a surgical intervention .  The patient's history has been reviewed, patient examined, no change in status, stable for surgery.  I have reviewed the patient's chart and labs.  Questions were answered to the patient's satisfaction.     Shaquitta Burbridge [employer]

## 2012-07-05 ENCOUNTER — Encounter (HOSPITAL_COMMUNITY): Payer: Self-pay | Admitting: Cardiology

## 2012-07-10 ENCOUNTER — Ambulatory Visit (INDEPENDENT_AMBULATORY_CARE_PROVIDER_SITE_OTHER): Payer: 59 | Admitting: *Deleted

## 2012-07-10 DIAGNOSIS — I2699 Other pulmonary embolism without acute cor pulmonale: Secondary | ICD-10-CM

## 2012-07-10 DIAGNOSIS — Z7901 Long term (current) use of anticoagulants: Secondary | ICD-10-CM | POA: Diagnosis not present

## 2012-07-10 LAB — POCT INR: INR: 2

## 2012-07-10 MED ORDER — ENOXAPARIN SODIUM 100 MG/ML ~~LOC~~ SOLN: 100.0000 mg | Freq: Two times a day (BID) | SUBCUTANEOUS | Status: DC

## 2012-07-10 NOTE — Patient Instructions (Addendum)
These instructions reviewed with patient over the phone after his cardiac cath date was changed by Dr Clifton James Rt and left  heart cath MOVED  to 07/14/12  4/21 hold coumadin 4/22 take lovenox 100 mg 10 am and 10 pm sub q 4/23 take lovenox 100 mg 10 am and 10 pm sub q 4/24 take lovenox 100 mg 10 am  Sub q 4/25 no lovenox prior to procedure today  Resume your coumadin and lovenox together after procedure when MD instructs you to For first 2 days take an extra 1/2 tablet to boost your INR up quickly so we can take you off lovenox injections Continue lovenox 100 mg subcutaneous two times a day until you return to coumadin clinic  Dose--lovenox 100 mg bid ordered Wt--99.33 [place]--1.4 Cc--73.90

## 2012-07-11 ENCOUNTER — Telehealth: Payer: Self-pay | Admitting: *Deleted

## 2012-07-11 DIAGNOSIS — I2510 Atherosclerotic heart disease of native coronary artery without angina pectoris: Secondary | ICD-10-CM

## 2012-07-11 DIAGNOSIS — I059 Rheumatic mitral valve disease, unspecified: Secondary | ICD-10-CM

## 2012-07-11 NOTE — Telephone Encounter (Signed)
Spoke with [name] in coumadin clinic. Pt did not start holding coumadin as directed. Cath will need to be rescheduled to 4/25. Spoke with [name] in [facility] lab and cath rescheduled to 4/25 at 7:30. Pt will need repeat lab work on 4/24.  Will also be seen in coumadin clinic on 4/24. I spoke with pt and gave him this information. He is aware coumadin clinic will contact him with appt time.

## 2012-07-13 ENCOUNTER — Ambulatory Visit (INDEPENDENT_AMBULATORY_CARE_PROVIDER_SITE_OTHER): Payer: 59 | Admitting: *Deleted

## 2012-07-13 ENCOUNTER — Other Ambulatory Visit (INDEPENDENT_AMBULATORY_CARE_PROVIDER_SITE_OTHER): Payer: 59

## 2012-07-13 DIAGNOSIS — I059 Rheumatic mitral valve disease, unspecified: Secondary | ICD-10-CM

## 2012-07-13 DIAGNOSIS — I2510 Atherosclerotic heart disease of native coronary artery without angina pectoris: Secondary | ICD-10-CM

## 2012-07-13 DIAGNOSIS — I2699 Other pulmonary embolism without acute cor pulmonale: Secondary | ICD-10-CM

## 2012-07-13 DIAGNOSIS — Z7901 Long term (current) use of anticoagulants: Secondary | ICD-10-CM | POA: Diagnosis not present

## 2012-07-13 LAB — CBC WITH DIFFERENTIAL/PLATELET
Basophils Relative: 1.2 % (ref 0.0–3.0)
Eosinophils Relative: 3.9 % (ref 0.0–5.0)
HCT: 39.8 % (ref 39.0–52.0)
Lymphs Abs: 1.8 10*3/uL (ref 0.7–4.0)
MCV: 89.6 fl (ref 78.0–100.0)
Monocytes Absolute: 0.3 10*3/uL (ref 0.1–1.0)
Platelets: 204 10*3/uL (ref 150.0–400.0)
WBC: 4.1 10*3/uL — ABNORMAL LOW (ref 4.5–10.5)

## 2012-07-13 LAB — BASIC METABOLIC PANEL
BUN: 18 mg/dL (ref 6–23)
CO2: 29 mEq/L (ref 19–32)
Chloride: 103 mEq/L (ref 96–112)
Potassium: 4.2 mEq/L (ref 3.5–5.1)

## 2012-07-14 ENCOUNTER — Other Ambulatory Visit: Payer: Self-pay | Admitting: *Deleted

## 2012-07-14 ENCOUNTER — Inpatient Hospital Stay (HOSPITAL_BASED_OUTPATIENT_CLINIC_OR_DEPARTMENT_OTHER)
Admission: RE | Admit: 2012-07-14 | Discharge: 2012-07-14 | Disposition: A | Discharge status: Home / Self Care | Payer: 59 | Source: Ambulatory Visit | Attending: Cardiovascular Disease | Admitting: Cardiovascular Disease

## 2012-07-14 ENCOUNTER — Encounter (HOSPITAL_BASED_OUTPATIENT_CLINIC_OR_DEPARTMENT_OTHER)
Admission: RE | Disposition: A | Discharge status: Home / Self Care | Payer: Self-pay | Source: Ambulatory Visit | Attending: Cardiovascular Disease

## 2012-07-14 DIAGNOSIS — I059 Rheumatic mitral valve disease, unspecified: Secondary | ICD-10-CM

## 2012-07-14 DIAGNOSIS — Z86711 Personal history of pulmonary embolism: Secondary | ICD-10-CM | POA: Diagnosis not present

## 2012-07-14 DIAGNOSIS — I2510 Atherosclerotic heart disease of native coronary artery without angina pectoris: Secondary | ICD-10-CM | POA: Diagnosis not present

## 2012-07-14 DIAGNOSIS — I340 Nonrheumatic mitral (valve) insufficiency: Secondary | ICD-10-CM

## 2012-07-14 DIAGNOSIS — I10 Essential (primary) hypertension: Secondary | ICD-10-CM | POA: Insufficient documentation

## 2012-07-14 DIAGNOSIS — E785 Hyperlipidemia, unspecified: Secondary | ICD-10-CM | POA: Insufficient documentation

## 2012-07-14 DIAGNOSIS — I739 Peripheral vascular disease, unspecified: Secondary | ICD-10-CM | POA: Diagnosis not present

## 2012-07-14 DIAGNOSIS — Z7901 Long term (current) use of anticoagulants: Secondary | ICD-10-CM | POA: Diagnosis not present

## 2012-07-14 DIAGNOSIS — I519 Heart disease, unspecified: Secondary | ICD-10-CM | POA: Insufficient documentation

## 2012-07-14 LAB — POCT I-STAT 3, ART BLOOD GAS (G3+)
Acid-base deficit: 1 mmol/L (ref 0.0–2.0)
Bicarbonate: 23.8 mEq/L (ref 20.0–24.0)
O2 Saturation: 96 %
pO2, Arterial: 85 mmHg (ref 80.0–100.0)

## 2012-07-14 LAB — POCT I-STAT 3, VENOUS BLOOD GAS (G3P V)
Bicarbonate: 26 mEq/L — ABNORMAL HIGH (ref 20.0–24.0)
pO2, Ven: 37 mmHg (ref 30.0–45.0)

## 2012-07-14 SURGERY — JV LEFT AND RIGHT HEART CATHETERIZATION WITH CORONARY ANGIOGRAM
Anesthesia: Moderate Sedation

## 2012-07-14 MED ORDER — SODIUM CHLORIDE 0.9 % IV SOLN: 250.0000 mL | INTRAVENOUS | Status: DC | PRN

## 2012-07-14 MED ORDER — ONDANSETRON HCL 4 MG/2ML IJ SOLN: 4.0000 mg | Freq: Four times a day (QID) | INTRAMUSCULAR | Status: DC | PRN

## 2012-07-14 MED ORDER — DIAZEPAM 5 MG PO TABS
5.0000 mg | ORAL_TABLET | ORAL | Status: AC
  Administered 2012-07-14: 5 mg via ORAL

## 2012-07-14 MED ORDER — ACETAMINOPHEN 325 MG PO TABS: 650.0000 mg | ORAL_TABLET | ORAL | Status: DC | PRN

## 2012-07-14 MED ORDER — SODIUM CHLORIDE 0.9 % IJ SOLN: 3.0000 mL | Freq: Two times a day (BID) | INTRAMUSCULAR | Status: DC

## 2012-07-14 MED ORDER — SODIUM CHLORIDE 0.9 % IV SOLN: INTRAVENOUS | Status: AC

## 2012-07-14 MED ORDER — SODIUM CHLORIDE 0.9 % IJ SOLN: 3.0000 mL | INTRAMUSCULAR | Status: DC | PRN

## 2012-07-14 MED ORDER — ASPIRIN 81 MG PO CHEW
324.0000 mg | CHEWABLE_TABLET | ORAL | Status: AC
  Administered 2012-07-14: 324 mg via ORAL

## 2012-07-14 MED ORDER — SODIUM CHLORIDE 0.9 % IV SOLN: INTRAVENOUS | Status: DC

## 2012-07-14 NOTE — CV Procedure (Signed)
Cardiac Catheterization Operative Report  [name] [id number] 4/25/201411:50 AM MCKEOWN,WILLIAM DAVID, MD  Procedure Performed:  1. Left Heart Catheterization 2. Selective Coronary Angiography 3. Right Heart Catheterization 4. Left ventricular angiogram 5. Distal aortogram with visualization of both iliac arteries.   Operator: Verne Carrow, MD  Indication:  67 yo AAM with h/o mitral regurgitation, HTN, hyperlipidemia, non-obstructive CAD by cath 8/09 and mild LV dysfunction who had a hip replacement 9/09 and presented with SOB 3/10 and was found to have bilateral pulmonary emboli. Repeat CT chest 11/14/08 with no evidence of PE. Lower extremity venous dopplers 9/10 with no evidence of DVT. Coumadin therapy was stopped 9/10. Echo 11/28/08 with normal LV size and systolic function with grade 2 diastolic dysfunction and mild to moderate MR, trivial AI. Readmitted to [facility] October 31, 2010 with SOB and found to have recurrent Pulmonary Embolism by CTA. He was restarted on coumadin in the hospital. He has been maintained on coumadin since then. Echo 10/31/10 with normal LV size and function, moderate MR. Repeat echo 06/20/12 showed dilated LV, normal LV function, severe MR secondary to flail posterior MV leaflet. TEE on 07/04/12 with severe MR with flail P2 segment. PISA ERO 0.53 cm^2. Pulmonary vein doppler interrogation shows systolic blunting but not reversal. Moderate dilation left atrium. Cardiac cath today to assess coronary arteries and pressures prior to surgical referral.               Procedure Details: The risks, benefits, complications, treatment options, and expected outcomes were discussed with the patient. The patient and/or family concurred with the proposed plan, giving informed consent. The patient was brought to the cath lab after IV hydration was begun and oral premedication was given. The patient was further sedated with Versed and Fentanyl. The left groin was  prepped and draped in the usual manner. Using the modified Seldinger access technique, a 4 French sheath was placed in the left femoral artery. A 6 French sheath was inserted into the left femoral vein. A multi-purpose catheter was used to perform a right heart catheterization. Standard diagnostic catheters were used to perform selective coronary angiography. A pigtail catheter was used to perform a left ventricular angiogram. The pigtail catheter was pulled back and a distal aortogram was performed.  There were no immediate complications. The patient was taken to the recovery area in stable condition.   Hemodynamic Findings: Ao:  94/60         LV:94/1/15 RA: 1               RV: 42/1/17 PA:   45/4 (mean 24)      PCWP: 8  Fick Cardiac Output: 5 L/min Fick Cardiac Index: 2.3 L/min/m2 Central Aortic Saturation: 96% Pulmonary Artery Saturation: 66%   Angiographic Findings:  Left main: No obstructive disease.   Left Anterior Descending Artery: Large caliber vessel that courses to the apex. The mid LAD has mild luminal irregularities. There is a small caliber intermediate branch with mild plaque disease. The first diagonal branch is a moderate caliber bifurcating vessel with 40% proximal stenosis.   Circumflex Artery: Large caliber vessel with very small caliber first obtuse marginal branch. The proximal and mid AV groove Circumflex has mild luminal irregularities. The second obtuse marginal branch is moderate in caliber and has a focal 30% stenosis. The left sided posterolateral branch has mild plaque disease.   Right Coronary Artery: Moderate caliber non-dominant vessel with mild luminal irregularities.   Distal Aortogram: No evidence of  obstructive disease or aneurysm.   Left Ventricular Angiogram: LVEF 50-55%. 3+ MR.   Impression: 1. Mild non-obstructive CAD 2. Preserved LV systolic function 3. Severe mitral valve regurgitation  Recommendations: He has severe MR secondary to flail P2  segment of mitral valve. He is asymptomatic at this time. Given his severe MR, will refer to see Dr. Tressie Stalker in the CT surgery office for consideration of valve repair.        Complications:  None; patient tolerated the procedure well.

## 2012-07-14 NOTE — OR Nursing (Signed)
Meal served 

## 2012-07-14 NOTE — OR Nursing (Signed)
Tegaderm dressing applied, site level 0, bedrest begins at 1205 

## 2012-07-14 NOTE — OR Nursing (Signed)
Dr McAlhany at bedside to discuss results and treatment plan with pt and family 

## 2012-07-14 NOTE — H&P (View-Only) (Signed)
 History of Present Illness: 67 yo AAM with h/o HTN, hyperlipidemia, non-obstructive CAD by cath 8/09 and mild LV dysfunction who had a hip replacement 9/09 and presented with SOB 3/10 and was found to have bilateral pulmonary emboli. Repeat CT chest 11/14/08 with no evidence of PE. Lower extremity venous dopplers 9/10 with no evidence of DVT. Coumadin therapy was stopped 9/10. Echo 11/28/08 with normal LV size and systolic function with grade 2 diastolic dysfunction and mild to moderate MR, trivial AI. Readmitted to [place] October 31, 2010 with SOB and found to have recurrent Pulmonary Embolism by CTA. He was restarted on coumadin in the hospital. He has been maintained on coumadin since then. Echo 10/31/10 with normal LV size and function, moderate MR. Repeat echo showed dilated LV, normal LV function, severe MR secondary to flail posterior MV leaflet.   He is here today for cardiac follow up. No change in breathing. He does note dyspnea with minimal exertion. No chest pain. No bleeding issues with coumadin.   Primary Care Physician: McKeown  Last Lipid Profile: Followed in primary care. Scanned in EPIC.    Past Medical History  Diagnosis Date  . Hyperlipidemia   . Seasonal allergies   . Osteoarthritis   . Pulmonary embolism     march 2010, 2012              /   DVT x  2  LEFT 2010  . Coronary artery disease     non-obstructive by cath 2009  . Heart murmur   . Pre-diabetes   . Hypertension     Clearance with note Dr McGowan on chart,  Lovonox bridging  ordered by Dr Mcalhaney    EKG 9/12, chst 9/12 EPIC  . Peripheral vascular disease   . Sleep apnea     STOP BANG SCORE 4    Past Surgical History  Procedure Laterality Date  . Achillis tendon      repair 20 years ago  . Hip arthroplasty      11/06/07  . Total hip arthroplasty  08/24/2011    Procedure: TOTAL HIP ARTHROPLASTY ANTERIOR APPROACH;  Surgeon: Matthew D Olin, MD;  Location: [facility];  Service: Orthopedics;  Laterality: Right;     Current Outpatient Prescriptions  Medication Sig Dispense Refill  . benazepril (LOTENSIN) 20 MG tablet Take 10 mg by mouth every evening. Takes 1/2 tablet to equal 10mg      . Cholecalciferol (VITAMIN D3) 5000 UNITS TABS Take 2 tablets by mouth daily.       . fenofibrate micronized (LOFIBRA) 134 MG capsule Take 134 mg by mouth every evening.       . fexofenadine (ALLEGRA) 30 MG tablet Take 30 mg by mouth daily.      . Flaxseed, Linseed, (FLAX SEED OIL) 1000 MG CAPS Take 1,000 mg by mouth 2 (two) times daily.      . guaiFENesin (MUCINEX) 600 MG 12 hr tablet Take 600 mg by mouth as needed.       . warfarin (COUMADIN) 5 MG tablet TAKE BY MOUTH AS DIRECTED BY ANTICOAGULATION CLINIC  145 tablet  1  . [DISCONTINUED] loratadine (CLARITIN) 10 MG tablet Take 10 mg by mouth daily.         No current facility-administered medications for this visit.    Allergies  Allergen Reactions  . Statins     REACTION: muscle aches    History   Social History  . Marital Status: Married      Spouse Name: N/A    Number of Children: N/A  . Years of Education: N/A   Occupational History  . dental hygeinist    Social History Main Topics  . Smoking status: Former Smoker    Types: Cigars    Quit date: 08/19/1947  . Smokeless tobacco: Current User    Types: Chew  . Alcohol Use: No  . Drug Use: No  . Sexually Active: Not on file   Other Topics Concern  . Not on file   Social History Narrative  . No narrative on file    Family History  Problem Relation Age of Onset  . Coronary artery disease    . Heart attack Father   . Heart attack Brother 60    Review of Systems:  As stated in the HPI and otherwise negative.   BP 123/75  Pulse 74  Ht 6' (1.829 m)  Wt 219 lb (99.338 kg)  BMI 29.7 kg/m2  Physical Examination: General: Well developed, well nourished, NAD HEENT: OP clear, mucus membranes moist SKIN: warm, dry. No rashes. Neuro: No focal deficits Musculoskeletal: Muscle strength  5/5 all ext Psychiatric: Mood and affect normal Neck: No JVD, no carotid bruits, no thyromegaly, no lymphadenopathy. Lungs:Clear bilaterally, no wheezes, rhonci, crackles Cardiovascular: Regular rate and rhythm. Loud systolic murmur. No gallops or rubs. Abdomen:Soft. Bowel sounds present. Non-tender.  Extremities: No lower extremity edema. Pulses are 2 + in the bilateral DP/PT.  EKG: Sinus, PVCs. LVH.   Echo 06/20/12: Left ventricle: The cavity size was moderately dilated. Wall thickness was increased in a pattern of mild LVH. Systolic function was normal. The estimated ejection fraction was in the range of 60% to 65%. Wall motion was normal; there were no regional wall motion abnormalities. - Mitral valve: The is flail of the posterior leaflet with very severe MR. - Left atrium: The atrium was moderately dilated. - Pulmonary arteries: PA peak pressure: 32mm Hg (S). - Impressions: SINCE 10/2010, THERE IS NOW FLAIL OF THE POSTERIOR MITRAL LEAFLET WITH SEVERE MR. Impressions:  - SINCE 10/2010, THERE IS NOW FLAIL OF THE POSTERIOR MITRAL LEAFLET WITH SEVERE MR.  Assessment and Plan:   1. MITRAL REGURGITATION: Now severe by echo 06/20/12 with flail leaflet. LV is dilated. LV function is normal. Will need TEE to better assess. Will arrange on 07/04/12 at [facility]. Will refer to see Dr. Owen in CT surgery clinic after his TEE. Right and left heart cath 07/12/12 to exclude progression of CAD and measure pressurs. Pre procedure labs today. Will hold coumadin 5 days before cath and have coumadin clinic help arrange bridging with Lovenox.   2. PE: He will need coumadin for lifetime. Will bridge with Lovenox for cath.  .  3. Right carotid bruit: No carotid disease by dopplers 4/13.   4. CAD, NATIVE VESSEL: Stable. No changes.   

## 2012-07-14 NOTE — Interval H&P Note (Signed)
History and Physical Interval Note:  07/14/2012 9:50 AM  [name]  has presented today for cardiac cath  with the diagnosis of mitrial regurgitation  The various methods of treatment have been discussed with the patient and family. After consideration of risks, benefits and other options for treatment, the patient has consented to  Procedure(s): JV LEFT AND RIGHT HEART CATHETERIZATION WITH CORONARY ANGIOGRAM (N/A) as a surgical intervention .  The patient's history has been reviewed, patient examined, no change in status, stable for surgery.  I have reviewed the patient's chart and labs.  Questions were answered to the patient's satisfaction.     [name]

## 2012-07-14 NOTE — OR Nursing (Signed)
Discharge instructions reviewed and signed, pt stated understanding, ambulated in hall without difficulty, site level 0, transported to wife's car via wheelchair 

## 2012-07-17 ENCOUNTER — Telehealth: Payer: Self-pay | Admitting: Cardiovascular Disease

## 2012-07-17 ENCOUNTER — Ambulatory Visit (INDEPENDENT_AMBULATORY_CARE_PROVIDER_SITE_OTHER): Payer: 59 | Admitting: *Deleted

## 2012-07-17 DIAGNOSIS — Z7901 Long term (current) use of anticoagulants: Secondary | ICD-10-CM | POA: Diagnosis not present

## 2012-07-17 DIAGNOSIS — I2699 Other pulmonary embolism without acute cor pulmonale: Secondary | ICD-10-CM | POA: Diagnosis not present

## 2012-07-17 NOTE — Telephone Encounter (Signed)
Follow-up:    Patient's wife returned your call.  Please call back.

## 2012-07-17 NOTE — Telephone Encounter (Signed)
Left pt a message to call back. 

## 2012-07-17 NOTE — Telephone Encounter (Signed)
Pt's wife states that when filling out the FMLA form Pt/wife, wrote the wrong word "Intermident" it should be " Intermittent "  Wife would like to have  this  word change in the FMLA form. Pt has not have the surgery at this time.

## 2012-07-17 NOTE — Telephone Encounter (Signed)
New Problem:    Patient called in because he wrote that wrong word on his FMLA form and would like to know if we would be able to change it . Please call back.

## 2012-07-18 NOTE — Telephone Encounter (Signed)
Spoke With Patricia/[employer] she will correct "Wrong Word" and Make sure it's Spelled Correctly on FMLA.

## 2012-07-18 NOTE — Telephone Encounter (Signed)
Will forward to medical records for follow up

## 2012-07-20 ENCOUNTER — Ambulatory Visit (INDEPENDENT_AMBULATORY_CARE_PROVIDER_SITE_OTHER): Payer: 59 | Admitting: *Deleted

## 2012-07-20 DIAGNOSIS — I2699 Other pulmonary embolism without acute cor pulmonale: Secondary | ICD-10-CM

## 2012-07-20 DIAGNOSIS — Z7901 Long term (current) use of anticoagulants: Secondary | ICD-10-CM

## 2012-07-24 ENCOUNTER — Ambulatory Visit (INDEPENDENT_AMBULATORY_CARE_PROVIDER_SITE_OTHER): Payer: 59

## 2012-07-24 DIAGNOSIS — I2699 Other pulmonary embolism without acute cor pulmonale: Secondary | ICD-10-CM

## 2012-07-24 DIAGNOSIS — Z7901 Long term (current) use of anticoagulants: Secondary | ICD-10-CM

## 2012-07-26 ENCOUNTER — Encounter: Payer: Self-pay | Admitting: *Deleted

## 2012-07-27 ENCOUNTER — Encounter: Payer: Self-pay | Admitting: Thoracic Surgery (Cardiothoracic Vascular Surgery)

## 2012-07-27 ENCOUNTER — Institutional Professional Consult (permissible substitution) (INDEPENDENT_AMBULATORY_CARE_PROVIDER_SITE_OTHER): Payer: 59 | Admitting: Thoracic Surgery (Cardiothoracic Vascular Surgery)

## 2012-07-27 VITALS — BP 137/76 | HR 73 | Resp 20 | Ht 72.0 in | Wt 217.0 lb

## 2012-07-27 DIAGNOSIS — I340 Nonrheumatic mitral (valve) insufficiency: Secondary | ICD-10-CM

## 2012-07-27 DIAGNOSIS — I2510 Atherosclerotic heart disease of native coronary artery without angina pectoris: Secondary | ICD-10-CM | POA: Diagnosis not present

## 2012-07-27 DIAGNOSIS — I059 Rheumatic mitral valve disease, unspecified: Secondary | ICD-10-CM | POA: Diagnosis not present

## 2012-07-27 MED ORDER — AMIODARONE HCL 200 MG PO TABS: 200.0000 mg | ORAL_TABLET | Freq: Two times a day (BID) | ORAL | Status: DC

## 2012-07-27 MED ORDER — ENOXAPARIN SODIUM 100 MG/ML ~~LOC~~ SOLN: 100.0000 mg | Freq: Two times a day (BID) | SUBCUTANEOUS | Status: DC

## 2012-07-27 NOTE — Progress Notes (Signed)
[address]            [name] [address]          [phone]     CARDIOTHORACIC SURGERY CONSULTATION REPORT  Referring Provider is [name] PCP is Nadean Corwin, MD  Chief Complaint  Patient presents with  . Mitral Regurgitation    Surgical eval, Cardiac Cath 07/14/12, TEE 07/04/12   . Coronary Artery Disease    HPI:  Patient is a 67 year old African American male with history of hypertension, recurrent pulmonary emboli, non-obstructive coronary artery disease, and mitral valve prolapse with mitral regurgitation who has been referred for elective surgical intervention. The patient states that he is been told that he has had a heart murmur for most of his life.  He underwent hip replacement in 2009 and developed shortness of breath in March of 2010. He was found to have bilateral pulmonary emboli. At that time he was noted to have mild to moderate mitral regurgitation on routine echocardiogram. He completed a six-month course of Coumadin but he developed recurrent pulmonary emboli in August 2012. He was restarted on Coumadin at that time. Echocardiogram at that time demonstrated moderate mitral regurgitation. He recently was seen in followup by Dr. Clifton James and repeat echocardiogram performed 06/20/2012 demonstrated severe mitral regurgitation with what appeared to be a flail posterior leaflet. This was confirmed by transesophageal echocardiogram performed 07/03/2012 by Dr. Shirlee Latch.  Left and right heart catheterization was performed demonstrating mild, nonobstructive coronary artery disease. There was mild pulmonary retention. There was no significant atherosclerotic disease involving the descending thoracic or abdominal aorta or iliac vessels. The patient was referred for elective surgical consultation.  The patient describes stable symptoms of exertional shortness of breath, functional class II. He denies resting shortness of  breath, chest discomfort, palpitations, dizzy spells, or syncope. He sometimes feels short of breath when he lays flat in bed and he occasionally wakes up in the middle night feeling of breath. He has not had lower extremity edema.  Past Medical History  Diagnosis Date  . Hyperlipidemia   . Seasonal allergies   . Osteoarthritis   . Pulmonary embolism     march 2010, 2012              /   DVT x  2  LEFT 2010  . Coronary artery disease     non-obstructive by cath 2009  . Heart murmur   . Pre-diabetes   . Hypertension     Clearance with note Dr Marvel Plan on chart,  Lovonox bridging  ordered by Dr Sanjuana Kava    EKG 9/12, chst 9/12 EPIC  . Peripheral vascular disease   . Sleep apnea     STOP BANG SCORE 4  . Severe mitral regurgitation by prior echocardiogram     Past Surgical History  Procedure Laterality Date  . Achillis tendon      repair 20 years ago  . Hip arthroplasty      11/06/07  . Total hip arthroplasty  08/24/2011    Procedure: TOTAL HIP ARTHROPLASTY ANTERIOR APPROACH;  Surgeon: Shelda Pal, MD;  Location: [facility];  Service: Orthopedics;  Laterality: Right;  . Tee without cardioversion N/A 07/04/2012    Procedure: TRANSESOPHAGEAL ECHOCARDIOGRAM (TEE);  Surgeon: Laurey Morale, MD;  Location: [facility];  Service: Cardiovascular;  Laterality: N/A;  . Cardiac catheterization      Family  History  Problem Relation Age of Onset  . Coronary artery disease    . Heart attack Father   . Heart attack Brother 60  . Hypertension Father   . Hypertension Sister   . Hypertension Brother   . Hyperlipidemia Sister   . Diabetes Sister   . Diabetes Brother     History   Social History  . Marital Status: Married    Spouse Name: N/A    Number of Children: 3  . Years of Education: N/A   Occupational History  . Industrial hygeinist    Social History Main Topics  . Smoking status: Former Smoker    Types: Cigars    Quit date: 08/18/1997  . Smokeless tobacco: Current User     Types: Chew  . Alcohol Use: No  . Drug Use: No  . Sexually Active: Not on file   Other Topics Concern  . Not on file   Social History Narrative  . No narrative on file    Current Outpatient Prescriptions  Medication Sig Dispense Refill  . benazepril (LOTENSIN) 20 MG tablet Take 10 mg by mouth every evening. Takes 1/2 tablet to equal 10mg       . Cholecalciferol (VITAMIN D3) 5000 UNITS TABS Take 2 tablets by mouth daily.       Marland Kitchen enoxaparin (LOVENOX) 100 MG/ML injection Inject 1 mL (100 mg total) into the skin every 12 (twelve) hours.  10 Syringe  1  . fenofibrate micronized (LOFIBRA) 134 MG capsule Take 134 mg by mouth every evening.       . fexofenadine (ALLEGRA) 30 MG tablet Take 30 mg by mouth daily.      . Flaxseed, Linseed, (FLAX SEED OIL) 1000 MG CAPS Take 1,000 mg by mouth 2 (two) times daily.      Marland Kitchen guaiFENesin (MUCINEX) 600 MG 12 hr tablet Take 600 mg by mouth as needed.       . warfarin (COUMADIN) 5 MG tablet TAKE BY MOUTH AS DIRECTED BY ANTICOAGULATION CLINIC  145 tablet  1  . [DISCONTINUED] loratadine (CLARITIN) 10 MG tablet Take 10 mg by mouth daily.         No current facility-administered medications for this visit.    Allergies  Allergen Reactions  . Statins     REACTION: muscle aches      Review of Systems:   General:  normal appetite, decreased energy, no weight gain, no weight loss, no fever  Cardiac:  no chest pain with exertion, no chest pain at rest, + SOB with exertion, no resting SOB, + occasional PND, + orthopnea, no palpitations, no arrhythmia, no atrial fibrillation, no LE edema, no dizzy spells, no syncope  Respiratory:  + exertional shortness of breath, no home oxygen, no productive cough, occasional dry cough, no bronchitis, no wheezing, no hemoptysis, no asthma, no pain with inspiration or cough, no sleep apnea, no CPAP at night  GI:   mild difficulty swallowing, + reflux, + frequent heartburn, no hiatal hernia, no abdominal pain, no constipation,  no diarrhea, no hematochezia, no hematemesis, no melena  GU:   no dysuria,  + frequency, no urinary tract infection, no hematuria, no enlarged prostate, no kidney stones, no kidney disease  Vascular:  no pain suggestive of claudication, no pain in feet, no leg cramps, no varicose veins, no DVT, no non-healing foot ulcer  Neuro:   no stroke, no TIA's, no seizures, no headaches, no temporary blindness one eye,  no slurred speech, no peripheral neuropathy, no  chronic pain, no instability of gait, no memory/cognitive dysfunction  Musculoskeletal: + arthritis, no joint swelling, no myalgias, no difficulty walking, normal mobility   Skin:   no rash, no itching, no skin infections, no pressure sores or ulcerations  Psych:   no anxiety, no depression, no nervousness, no unusual recent stress  Eyes:   no blurry vision, no floaters, no recent vision changes, + wears glasses or contacts  ENT:   no hearing loss, no loose or painful teeth, no dentures, last saw dentist 06/28/2012  Hematologic:  no easy bruising, no abnormal bleeding, no problems on coumadin therapy, no frequent epistaxis  Endocrine:  no diabetes, does not check CBG's at home     Physical Exam:   BP 137/76  Pulse 73  Resp 20  Ht 6' (1.829 m)  Wt 217 lb (98.431 kg)  BMI 29.42 kg/m2  SpO2 96%  General:    well-appearing  HEENT:  Unremarkable   Neck:   no JVD, no bruits, no adenopathy   Chest:   clear to auscultation, symmetrical breath sounds, no wheezes, no rhonchi   CV:   RRR, loud grade IV/VI holosystolic murmur   Abdomen:  soft, non-tender, no masses   Extremities:  warm, well-perfused, pulses palpable, no LE edema  Rectal/GU  Deferred  Neuro:   Grossly non-focal and symmetrical throughout  Skin:   Clean and dry, no rashes, no breakdown    Diagnostic Tests:  Transthoracic Echocardiography  Patient:    [name], [name] MR #:       [id number] Study Date: 06/20/2012 Gender:     M Age:        66 Height:     182.9cm Weight:      100.2kg BSA:        2.34m^2 Pt. Status: Room:    ATTENDING    Willa Rough  PERFORMING   [name]  SONOGRAPHER  Philomena Course, RDCS  ORDERING     Clifton James, Christopher  Myna Hidalgo, Christopher cc:  ------------------------------------------------------------ LV EF: 60% -   65%  ------------------------------------------------------------ Indications:      Mitral regurgitation 424.0.  ------------------------------------------------------------ History:   PMH:  Peripheral vascular disease. Pulmonary emboli. Acquired from the patient and from the patient's chart.  Dyspnea.  Coronary artery disease.  Moderate mitral regurgitation.  Risk factors:  Former tobacco use. Hypertension. Dyslipidemia.  ------------------------------------------------------------ Study Conclusions  - Left ventricle: The cavity size was moderately dilated.   Wall thickness was increased in a pattern of mild LVH.   Systolic function was normal. The estimated ejection   fraction was in the range of 60% to 65%. Wall motion was   normal; there were no regional wall motion abnormalities. - Mitral valve: The is flail of the posterior leaflet with   very severe MR. - Left atrium: The atrium was moderately dilated. - Pulmonary arteries: PA peak pressure: 32mm Hg (S). - Impressions: SINCE 10/2010, THERE IS NOW FLAIL OF THE   POSTERIOR MITRAL LEAFLET WITH SEVERE MR. Impressions:  - SINCE 10/2010, THERE IS NOW FLAIL OF THE POSTERIOR MITRAL   LEAFLET WITH SEVERE MR.  ------------------------------------------------------------ Labs, prior tests, procedures, and surgery: Catheterization (August 2009).  Transthoracic echocardiography.  M-mode, complete 2D, spectral Doppler, and color Doppler.  Height:  Height: 182.9cm. Height: 72in.  Weight:  Weight: 100.2kg. Weight: 220.5lb.  Body mass index:  BMI: 30kg/m^2.  Body surface area:    BSA: 2.51m^2.  Blood pressure:     134/77.  Patient status:  Outpatient.  Location:  [facility]  ------------------------------------------------------------  ------------------------------------------------------------ Left ventricle:  The cavity size was moderately dilated. Wall thickness was increased in a pattern of mild LVH. Systolic function was normal. The estimated ejection fraction was in the range of 60% to 65%. Wall motion was normal; there were no regional wall motion abnormalities.   ------------------------------------------------------------ Aortic valve:   Structurally normal valve.   Cusp separation was normal.  Doppler:  Transvalvular velocity was within the normal range. There was no stenosis.  No regurgitation.  ------------------------------------------------------------ Aorta:  Aortic root: The aortic root was normal in size.  ------------------------------------------------------------ Mitral valve:  The is flail of the posterior leaflet with very severe MR.  Doppler:     Peak gradient: 8mm Hg (D).  ------------------------------------------------------------ Left atrium:  The atrium was moderately dilated.  ------------------------------------------------------------ Right ventricle:  The cavity size was normal. Systolic function was normal.  ------------------------------------------------------------ Pulmonic valve:    The valve appears to be grossly normal.  Doppler:   No significant regurgitation.  ------------------------------------------------------------ Tricuspid valve:   Structurally normal valve.   Leaflet separation was normal.  Doppler:  Transvalvular velocity was within the normal range.  Mild regurgitation.  ------------------------------------------------------------ Right atrium:  The atrium was normal in size.  ------------------------------------------------------------ Pericardium:  There was no pericardial  effusion.  ------------------------------------------------------------ Systemic veins: Inferior vena cava: The vessel was normal in size; the respirophasic diameter changes were in the normal range (= 50%); findings are consistent with normal central venous pressure.  ------------------------------------------------------------  2D measurements        Normal  Doppler measurements    Norma Left ventricle                                         l LVID ED,   62.1 mm     43-52   Main pulmonary artery chord,                         Pressure,   32 mm Hg    =30 PLAX                           S LVID ES,   45.1 mm     23-38   Left ventricle chord,                         Ea, lat    15. cm/s     ----- PLAX                           ann, tiss    5 FS, chord,   27 %      >29     DP PLAX                           E/Ea, lat  8.9          ----- LVPW, ED   12.1 mm     ------  ann, tiss    7 IVS/LVPW   1.05        <1.3    DP ratio, ED  Ea, med    6.8 cm/s     ----- Ventricular septum             ann, tiss    2 IVS, ED    12.7 mm     ------  DP LVOT                           E/Ea, med  20.          ----- Diam, S      23 mm     ------  ann, tiss   38 Area       4.15 cm^2   ------  DP Diam         23 mm     ------  LVOT Aorta                          Peak vel,  98. cm/s     ----- Root diam,   36 mm     ------  S            7 ED                             VTI, S     14. cm       ----- Left atrium                                 7 AP dim       49 mm     ------  HR          66 bpm      ----- AP dim     2.21 cm/m^2 <2.2    Stroke vol 61. ml       ----- index                                       1                                Cardiac      4 L/min    -----                                output                                Cardiac    1.8 L/(min-m -----                                index          ^2)                                Stroke     27. ml/m^2   -----  index        5                                Mitral valve                                Peak E vel 139 cm/s     -----                                Peak A vel 65. cm/s     -----                                             4                                Decelerati 173 ms       150-2                                on time                 30                                Peak         8 mm Hg    -----                                gradient,                                D                                Peak E/A   2.1          -----                                ratio                                Regurg     56. cm/s     -----                                alias vel,   2                                PISA                                Max regurg 518 cm/s     -----  vel                                Regurg VTI 136 cm       -----                                ERO, PISA  0.5 cm^2     -----                                             5                                Regurg      75 ml       -----                                vol, PISA                                RF, PISA   55. %        -----                                            12                                Tricuspid valve                                Regurg     259 cm/s     -----                                peak vel                                Peak RV-RA  27 mm Hg    -----                                gradient,                                S                                Systemic veins                                Estimated    5 mm Hg    -----  CVP                                Right ventricle                                Pressure,   32 mm Hg    <30                                S   ------------------------------------------------------------ Prepared and Electronically Authenticated by  Willa Rough 2014-04-01T11:58:[id number]    Transesophageal Echocardiography  Patient:    [name], [name] MR #:       [id number] Study Date: 07/04/2012 Gender:     M Age:        66 Height:     182.9cm Weight:     99.5kg BSA:        2.15m^2 Pt. Status: Room:       [facility]    SONOGRAPHER  Perley Jain, RDCS  ADMITTING    Marca Ancona  ATTENDING    Shirlee Latch, Dalton  PERFORMING   Shirlee Latch, Dalton  ORDERING     McAlhany, Christopher  REFERRING    Verne Carrow cc:  ------------------------------------------------------------ LV EF: 55% -   60%  ------------------------------------------------------------ Indications:      Mitral regurgitation 424.0.  ------------------------------------------------------------ Study Conclusions  - Left ventricle: The cavity size was mildly dilated. Wall   thickness was normal. Systolic function was normal. The   estimated ejection fraction was in the range of 55% to   60%. - Aortic valve: There was no stenosis. - Mitral valve: Severe MR with flail P2 segment. PISA ERO   0.53 cm^2. Pulmonary vein doppler interrogation shows   systolic blunting but not reversal. - Left atrium: The atrium was moderately dilated. No   evidence of thrombus in the atrial cavity or appendage. - Right ventricle: The cavity size was normal. Systolic   function was normal. - Right atrium: No evidence of thrombus in the atrial cavity   or appendage. - Atrial septum: No defect or patent foramen ovale was   identified. Echo contrast study showed no right-to-left   atrial level shunt, at baseline or with provocation. Transesophageal echocardiography.  2D and color Doppler. Height:  Height: 182.9cm. Height: 72in.  Weight:  Weight: 99.5kg. Weight: 219lb.  Body mass index:  BMI: 29.8kg/m^2. Body surface area:    BSA: 2.75m^2.  Blood pressure: 123/75.  Patient status:  Outpatient.  Location:  Endoscopy.    ------------------------------------------------------------  ------------------------------------------------------------ Left ventricle:  The cavity size was mildly dilated. Wall thickness was normal. Systolic function was normal. The estimated ejection fraction was in the range of 55% to 60%.   ------------------------------------------------------------ Aortic valve:   Trileaflet.  Doppler:   There was no stenosis.    No regurgitation.  ------------------------------------------------------------ Mitral valve:  Normal caliber, mild plaque descending thoracic aorta. Severe MR with flail P2 segment. PISA ERO 0.53 cm^2. Pulmonary vein doppler interrogation shows systolic blunting but not reversal.  ------------------------------------------------------------ Left atrium:  The atrium was moderately dilated.  No evidence of thrombus in the atrial cavity or appendage.  ------------------------------------------------------------ Atrial septum:  No defect or patent foramen ovale was identified.  Echo contrast study showed no right-to-left atrial level shunt, at baseline or with provocation.  ------------------------------------------------------------ Right ventricle:  The cavity size  was normal. Systolic function was normal.  ------------------------------------------------------------ Pulmonic valve:    Structurally normal valve.   Cusp separation was normal.  ------------------------------------------------------------ Tricuspid valve:   Doppler:   Trivial regurgitation.  ------------------------------------------------------------ Right atrium:  The atrium was normal in size.  No evidence of thrombus in the atrial cavity or appendage.  ------------------------------------------------------------ Pericardium:  There was no pericardial effusion.  ------------------------------------------------------------  Doppler measurements   Normal Mitral valve Regurg      38.5  cm/s  ------ alias vel, PISA Max regurg   550 cm/s  ------ vel Regurg VTI   160 cm    ------ ERO, PISA   0.53 cm^2  ------ Regurg        85 ml    ------ vol, PISA   ------------------------------------------------------------ Prepared and Electronically Authenticated by  Marca Ancona 2014-04-15T23:15:48.107     Cardiac Catheterization Operative Report  EDUAR KUMPF [id number] 4/25/201411:50 AM MCKEOWN,WILLIAM DAVID, MD  Procedure Performed:   1. Left Heart Catheterization 2. Selective Coronary Angiography 3. Right Heart Catheterization 4. Left ventricular angiogram 5. Distal aortogram with visualization of both iliac arteries.  Operator: Verne Carrow, MD  Indication:  67 yo AAM with h/o mitral regurgitation, HTN, hyperlipidemia, non-obstructive CAD by cath 8/09 and mild LV dysfunction who had a hip replacement 9/09 and presented with SOB 3/10 and was found to have bilateral pulmonary emboli. Repeat CT chest 11/14/08 with no evidence of PE. Lower extremity venous dopplers 9/10 with no evidence of DVT. Coumadin therapy was stopped 9/10. Echo 11/28/08 with normal LV size and systolic function with grade 2 diastolic dysfunction and mild to moderate MR, trivial AI. [facility] to [facility], 2010 with SOB and found to have recurrent Pulmonary Embolism by CTA. He was restarted on coumadin in the hospital. He has been maintained on coumadin since then. Echo 10/31/10 with normal LV size and function, moderate MR. Repeat echo 06/20/12 showed dilated LV, normal LV function, severe MR secondary to flail posterior MV leaflet. TEE on 07/04/12 with severe MR with flail P2 segment. PISA ERO 0.53 cm^2. Pulmonary vein doppler interrogation shows systolic blunting but not reversal. Moderate dilation left atrium. Cardiac cath today to assess coronary arteries and pressures prior to surgical referral.                Procedure Details: The risks, benefits, complications, treatment  options, and expected outcomes were discussed with the patient. The patient and/or family concurred with the proposed plan, giving informed consent. The patient was brought to the cath lab after IV hydration was begun and oral premedication was given. The patient was further sedated with Versed and Fentanyl. The left groin was prepped and draped in the usual manner. Using the modified Seldinger access technique, a 4 French sheath was placed in the left femoral artery. A 6 French sheath was inserted into the left femoral vein. A multi-purpose catheter was used to perform a right heart catheterization. Standard diagnostic catheters were used to perform selective coronary angiography. A pigtail catheter was used to perform a left ventricular angiogram. The pigtail catheter was pulled back and a distal aortogram was performed.  There were no immediate complications. The patient was taken to the recovery area in stable condition.   Hemodynamic Findings: Ao:  94/60         LV:94/1/15 RA: 1               RV: 42/1/17 PA:   45/4 (mean 24)      PCWP:  8  Fick Cardiac Output: 5 L/min Fick Cardiac Index: 2.3 L/min/m2 Central Aortic Saturation: 96% Pulmonary Artery Saturation: 66%   Angiographic Findings:  Left main: No obstructive disease.   Left Anterior Descending Artery: Large caliber vessel that courses to the apex. The mid LAD has mild luminal irregularities. There is a small caliber intermediate branch with mild plaque disease. The first diagonal branch is a moderate caliber bifurcating vessel with 40% proximal stenosis.    Circumflex Artery: Large caliber vessel with very small caliber first obtuse marginal branch. The proximal and mid AV groove Circumflex has mild luminal irregularities. The second obtuse marginal branch is moderate in caliber and has a focal 30% stenosis. The left sided posterolateral branch has mild plaque disease.    Right Coronary Artery: Moderate caliber non-dominant vessel  with mild luminal irregularities.   Distal Aortogram: No evidence of obstructive disease or aneurysm.   Left Ventricular Angiogram: LVEF 50-55%. 3+ MR.   Impression: 1. Mild non-obstructive CAD 2. Preserved LV systolic function 3. Severe mitral valve regurgitation  Recommendations: He has severe MR secondary to flail P2 segment of mitral valve. He is asymptomatic at this time. Given his severe MR, will refer to see Dr. Tressie Stalker in the CT surgery office for consideration of valve repair.          Complications:  None; patient tolerated the procedure well.             Impression:  Patient has mitral valve prolapse with a large flail segment of the posterior leaflet and severe mitral regurgitation. There is mild to moderate left ventricular chamber enlargement with reasonably well-preserved systolic function. There is mild pulmonary hypertension. 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 The patient is symptomatic with stable symptoms of exertional shortness of breath and orthopnea. I agree that elective mitral valve repair is indicated, and the patient appears to be fairly good candidate for minimally invasive approach for surgery.   Plan:  The rationale for elective mitral valve repair surgery has been explained, including a comparison between surgery and continued medical therapy with close follow-up.  The likelihood of successful and durable valve repair has been discussed with particular reference to the findings of their recent echocardiogram.  Based upon these findings and previous experience, I have quoted them a greater than 95 percent likelihood of successful valve repair.  In the unlikely event that their valve cannot be successfully repaired, we discussed the possibility of replacing the mitral valve using a mechanical prosthesis with the attendant need for  long-term anticoagulation versus the alternative of replacing it using a bioprosthetic tissue valve with its potential for late structural valve deterioration and failure, depending upon the patient's longevity.  The patient specifically requests that if the mitral valve must be replaced that it be done using a mechanical valve.  Alternative surgical approaches have been discussed, including a comparison between conventional sternotomy and minimally-invasive techniques.  The relative risks and benefits of each have been reviewed as they pertain to the patient's specific circumstances.  All questions have been addressed.  The patient is eager to proceed with surgery in the near future. We tentatively plan for minimally invasive mitral valve repair on Tuesday, May 27. The patient will stop taking Coumadin and begin taking amiodarone one week prior to surgery on Tuesday, May 20. The patient will use Lovenox injections as a bridge with his last dose  of Lovenox on Sunday, May 25. The patient will return to see [place] here in the office for followup prior to surgery on Thursday, May 22.     Salvatore Decent. Cornelius Moras, MD 07/27/2012 4:11 PM

## 2012-07-27 NOTE — Patient Instructions (Signed)
Stop taking coumadin and begin taking amiodarone on Tuesday May 20th  Begin lovenox injections Thursday May 22nd and stop lovenox injections after taking it on Sunday May 25th

## 2012-07-28 ENCOUNTER — Other Ambulatory Visit: Payer: Self-pay | Admitting: *Deleted

## 2012-07-28 DIAGNOSIS — I059 Rheumatic mitral valve disease, unspecified: Secondary | ICD-10-CM

## 2012-07-31 ENCOUNTER — Encounter: Payer: 59 | Admitting: Thoracic Surgery (Cardiothoracic Vascular Surgery)

## 2012-08-04 ENCOUNTER — Encounter (HOSPITAL_COMMUNITY): Payer: Self-pay | Admitting: Pharmacy Technician

## 2012-08-07 ENCOUNTER — Ambulatory Visit (INDEPENDENT_AMBULATORY_CARE_PROVIDER_SITE_OTHER): Payer: 59

## 2012-08-07 DIAGNOSIS — I2699 Other pulmonary embolism without acute cor pulmonale: Secondary | ICD-10-CM

## 2012-08-07 DIAGNOSIS — Z7901 Long term (current) use of anticoagulants: Secondary | ICD-10-CM

## 2012-08-07 LAB — POCT INR: INR: 2.2

## 2012-08-07 NOTE — Patient Instructions (Signed)
Take your last dosage of Coumadin today. 08/08/12 No Coumadin, No Lovenox 08/09/12 No Coumadin, No Lovenox 08/10/12 Start taking Lovenox 100mg  in the am, Lovenox 100mg  in the pm 08/11/12 Lovenox 100mg  in the am, Lovenox 100mg  in the pm 08/12/12 Lovenox 100mg  in the am, Lovenox 100mg  in the pm 08/13/12 Lovenox 100mg  in the am, Lovenox 100mg  in the pm 08/14/12 Per Dr Cornelius Moras stop Lovenox prior to procedure on 08/15/12.

## 2012-08-10 ENCOUNTER — Ambulatory Visit (HOSPITAL_COMMUNITY)
Admission: RE | Admit: 2012-08-10 | Discharge: 2012-08-10 | Disposition: A | Discharge status: Home / Self Care | Payer: 59 | Source: Ambulatory Visit | Attending: Thoracic Surgery (Cardiothoracic Vascular Surgery) | Admitting: Thoracic Surgery (Cardiothoracic Vascular Surgery)

## 2012-08-10 ENCOUNTER — Encounter (HOSPITAL_COMMUNITY)
Admission: RE | Admit: 2012-08-10 | Discharge: 2012-08-10 | Disposition: A | Discharge status: Home / Self Care | Payer: 59 | Source: Ambulatory Visit | Attending: Thoracic Surgery (Cardiothoracic Vascular Surgery) | Admitting: Thoracic Surgery (Cardiothoracic Vascular Surgery)

## 2012-08-10 ENCOUNTER — Encounter: Payer: Self-pay | Admitting: Thoracic Surgery (Cardiothoracic Vascular Surgery)

## 2012-08-10 ENCOUNTER — Encounter (HOSPITAL_COMMUNITY): Payer: Self-pay

## 2012-08-10 ENCOUNTER — Ambulatory Visit (INDEPENDENT_AMBULATORY_CARE_PROVIDER_SITE_OTHER): Payer: 59 | Admitting: Thoracic Surgery (Cardiothoracic Vascular Surgery)

## 2012-08-10 VITALS — BP 129/71 | HR 84 | Resp 20 | Ht 72.0 in | Wt 219.0 lb

## 2012-08-10 VITALS — BP 104/75 | HR 55 | Temp 98.4°F | Resp 20 | Ht 72.0 in | Wt 219.8 lb

## 2012-08-10 DIAGNOSIS — I059 Rheumatic mitral valve disease, unspecified: Secondary | ICD-10-CM

## 2012-08-10 DIAGNOSIS — I2510 Atherosclerotic heart disease of native coronary artery without angina pectoris: Secondary | ICD-10-CM

## 2012-08-10 DIAGNOSIS — I2699 Other pulmonary embolism without acute cor pulmonale: Secondary | ICD-10-CM | POA: Insufficient documentation

## 2012-08-10 DIAGNOSIS — Z01810 Encounter for preprocedural cardiovascular examination: Secondary | ICD-10-CM

## 2012-08-10 DIAGNOSIS — Z01812 Encounter for preprocedural laboratory examination: Secondary | ICD-10-CM | POA: Insufficient documentation

## 2012-08-10 DIAGNOSIS — Z01818 Encounter for other preprocedural examination: Secondary | ICD-10-CM | POA: Insufficient documentation

## 2012-08-10 DIAGNOSIS — I340 Nonrheumatic mitral (valve) insufficiency: Secondary | ICD-10-CM

## 2012-08-10 LAB — BLOOD GAS, ARTERIAL
Bicarbonate: 24.1 mEq/L — ABNORMAL HIGH (ref 20.0–24.0)
Drawn by: 206361
FIO2: 0.21 %
pCO2 arterial: 39.4 mmHg (ref 35.0–45.0)
pH, Arterial: 7.404 (ref 7.350–7.450)
pO2, Arterial: 97.5 mmHg (ref 80.0–100.0)

## 2012-08-10 LAB — CBC
Hemoglobin: 14.1 g/dL (ref 13.0–17.0)
MCH: 30.8 pg (ref 26.0–34.0)
Platelets: 203 10*3/uL (ref 150–400)
RBC: 4.58 MIL/uL (ref 4.22–5.81)

## 2012-08-10 LAB — URINALYSIS, ROUTINE W REFLEX MICROSCOPIC
Bilirubin Urine: NEGATIVE
Ketones, ur: NEGATIVE mg/dL
Leukocytes, UA: NEGATIVE
Nitrite: NEGATIVE
Protein, ur: NEGATIVE mg/dL
Urobilinogen, UA: 0.2 mg/dL (ref 0.0–1.0)
pH: 5 (ref 5.0–8.0)

## 2012-08-10 LAB — COMPREHENSIVE METABOLIC PANEL
ALT: 17 U/L (ref 0–53)
AST: 32 U/L (ref 0–37)
Alkaline Phosphatase: 42 U/L (ref 39–117)
CO2: 18 mEq/L — ABNORMAL LOW (ref 19–32)
Calcium: 9.6 mg/dL (ref 8.4–10.5)
GFR calc Af Amer: 55 mL/min — ABNORMAL LOW (ref >90)
Glucose, Bld: 91 mg/dL (ref 70–99)
Potassium: 4.3 mEq/L (ref 3.5–5.1)
Sodium: 137 mEq/L (ref 135–145)
Total Protein: 7.5 g/dL (ref 6.0–8.3)

## 2012-08-10 LAB — ABO/RH: ABO/RH(D): A POS

## 2012-08-10 LAB — TYPE AND SCREEN: Antibody Screen: NEGATIVE

## 2012-08-10 LAB — PULMONARY FUNCTION TEST

## 2012-08-10 LAB — PROTIME-INR
INR: 1.79 — ABNORMAL HIGH (ref 0.00–1.49)
Prothrombin Time: 20.2 seconds — ABNORMAL HIGH (ref 11.6–15.2)

## 2012-08-10 LAB — APTT: aPTT: 41 seconds — ABNORMAL HIGH (ref 24–37)

## 2012-08-10 LAB — SURGICAL PCR SCREEN
MRSA, PCR: NEGATIVE
Staphylococcus aureus: NEGATIVE

## 2012-08-10 MED ORDER — CHLORHEXIDINE GLUCONATE 4 % EX LIQD: 30.0000 mL | CUTANEOUS | Status: DC

## 2012-08-10 NOTE — H&P (Signed)
CARDIOTHORACIC SURGERY HISTORY AND PHYSICAL EXAM  Referring Provider is [name] PCP is [name]    Chief Complaint   Patient presents with   .  Mitral Regurgitation       Surgical eval, Cardiac Cath 07/14/12, TEE 07/04/12    .  Coronary Artery Disease     HPI:  Patient is a 67 year old African American male with history of hypertension, recurrent pulmonary emboli, non-obstructive coronary artery disease, and mitral valve prolapse with mitral regurgitation who has been referred for elective surgical intervention. The patient states that he is been told that he has had a heart murmur for most of his life.  He underwent hip replacement in 2009 and developed shortness of breath in March of 2010. He was found to have bilateral pulmonary emboli. At that time he was noted to have mild to moderate mitral regurgitation on routine echocardiogram. He completed a six-month course of Coumadin but he developed recurrent pulmonary emboli in August 2012. He was restarted on Coumadin at that time. Echocardiogram at that time demonstrated moderate mitral regurgitation. He recently was seen in followup by [name] and repeat echocardiogram performed 06/20/2012 demonstrated severe mitral regurgitation with what appeared to be a flail posterior leaflet. This was confirmed by transesophageal echocardiogram performed 07/03/2012 by [name].  Left and right heart catheterization was performed demonstrating mild, nonobstructive coronary artery disease. There was mild pulmonary retention. There was no significant atherosclerotic disease involving the descending thoracic or abdominal aorta or iliac vessels. The patient was referred for elective surgical consultation.  He was originally seen in consultation on 07/27/2012.  He reports no new problems or complaints since his last office visit. He stopped taking Coumadin and begin taking amiodarone 2 days ago, and he is now doing  daily Lovenox injections as a bridge between now and the time of surgery.   The patient describes stable symptoms of exertional shortness of breath, functional class II. He denies resting shortness of breath, chest discomfort, palpitations, dizzy spells, or syncope. He sometimes feels short of breath when he lays flat in bed and he occasionally wakes up in the middle night feeling of breath. He has not had lower extremity edema.   Past Medical History  Diagnosis Date  . Hyperlipidemia   . Seasonal allergies   . Osteoarthritis   . Pulmonary embolism     march 2010, 2012              /   DVT x  2  LEFT 2010  . Coronary artery disease     non-obstructive by cath 2009  . Heart murmur   . Pre-diabetes   . Hypertension     Clearance with note [name] on chart,  Lovonox bridging  ordered by [name]    EKG 9/12, chst 9/12 EPIC  . Peripheral vascular disease   . Severe mitral regurgitation by prior echocardiogram   . Sleep apnea     STOP BANG SCORE 4    Past Surgical History  Procedure Laterality Date  . Achillis tendon      repair 20 years ago  . Hip arthroplasty      11/06/07  . Total hip arthroplasty  08/24/2011    Procedure: TOTAL HIP ARTHROPLASTY ANTERIOR APPROACH;  Surgeon: Marcus Pal, Griffin;  Location: [facility];  Service: Orthopedics;  Laterality: Right;  . Tee without cardioversion N/A 07/04/2012    Procedure: TRANSESOPHAGEAL ECHOCARDIOGRAM (TEE);  Surgeon: Marcus Morale, Griffin;  Location: [facility];  Service: Cardiovascular;  Laterality: N/A;  . Cardiac catheterization      Family History  Problem Relation Age of Onset  . Coronary artery disease    . Heart attack Father   . Heart attack Brother 60  . Hypertension Father   . Hypertension Sister   . Hypertension Brother   . Hyperlipidemia Sister   . Diabetes Sister   . Diabetes Brother     Social History History  Substance Use Topics  . Smoking status: Former Smoker    Types: Cigars    Quit date: 08/18/1997    . Smokeless tobacco: Current User    Types: Chew  . Alcohol Use: No    Prior to Admission medications   Medication Sig Start Date End Date Taking? Authorizing Provider  amiodarone (PACERONE) 200 MG tablet Take 1 tablet (200 mg total) by mouth 2 (two) times daily. Begin 7 days prior to surgery. 07/27/12  Yes Marcus Nails, Griffin  benazepril (LOTENSIN) 20 MG tablet Take 10 mg by mouth every evening.    Yes Historical Provider, Griffin  enoxaparin (LOVENOX) 100 MG/ML injection Inject 1 mL (100 mg total) into the skin every 12 (twelve) hours. 07/27/12  Yes Marcus Nails, Griffin  fenofibrate micronized (LOFIBRA) 134 MG capsule Take 134 mg by mouth every evening.    Yes Historical Provider, Griffin  fexofenadine (ALLEGRA) 30 MG tablet Take 30 mg by mouth daily.   Yes Historical Provider, Griffin  guaiFENesin (MUCINEX) 600 MG 12 hr tablet Take 600 mg by mouth 2 (two) times daily as needed for congestion.    Yes Historical Provider, Griffin  warfarin (COUMADIN) 5 MG tablet Take 5-10 mg by mouth daily. 5mg  on tue, thu, sat and 10 mg on mon, wed, fri, sun   Yes Historical Provider, Griffin    Allergies  Allergen Reactions  . Statins     REACTION: muscle aches    Review of Systems:              General:                      normal appetite, decreased energy, no weight gain, no weight loss, no fever             Cardiac:                      no chest pain with exertion, no chest pain at rest, + SOB with exertion, no resting SOB, + occasional PND, + orthopnea, no palpitations, no arrhythmia, no atrial fibrillation, no LE edema, no dizzy spells, no syncope             Respiratory:                + exertional shortness of breath, no home oxygen, no productive cough, occasional dry cough, no bronchitis, no wheezing, no hemoptysis, no asthma, no pain with inspiration or cough, no sleep apnea, no CPAP at night             GI:                                mild difficulty swallowing, + reflux, + frequent heartburn, no hiatal hernia, no  abdominal pain, no constipation, no diarrhea, no hematochezia, no hematemesis, no melena  GU:                              no dysuria,  + frequency, no urinary tract infection, no hematuria, no enlarged prostate, no kidney stones, no kidney disease             Vascular:                     no pain suggestive of claudication, no pain in feet, no leg cramps, no varicose veins, no DVT, no non-healing foot ulcer             Neuro:                         no stroke, no TIA's, no seizures, no headaches, no temporary blindness one eye,  no slurred speech, no peripheral neuropathy, no chronic pain, no instability of gait, no memory/cognitive dysfunction             Musculoskeletal:         + arthritis, no joint swelling, no myalgias, no difficulty walking, normal mobility               Skin:                            no rash, no itching, no skin infections, no pressure sores or ulcerations             Psych:                         no anxiety, no depression, no nervousness, no unusual recent stress             Eyes:                           no blurry vision, no floaters, no recent vision changes, + wears glasses or contacts             ENT:                            no hearing loss, no loose or painful teeth, no dentures, last saw dentist 06/28/2012             Hematologic:               no easy bruising, no abnormal bleeding, no problems on coumadin therapy, no frequent epistaxis             Endocrine:                   no diabetes, does not check CBG's at home                           Physical Exam:              BP 137/76  Pulse 73  Resp 20  Ht 6' (1.829 m)  Wt 217 lb (98.431 kg)  BMI 29.42 kg/m2  SpO2 96%             General:                        well-appearing  HEENT:                       Unremarkable               Neck:                           no JVD, no bruits, no adenopathy               Chest:                         clear to auscultation, symmetrical breath  sounds, no wheezes, no rhonchi               CV:                              RRR, loud grade IV/VI holosystolic murmur               Abdomen:                    soft, non-tender, no masses               Extremities:                 warm, well-perfused, pulses palpable, no LE edema             Rectal/GU                   Deferred             Neuro:                         Grossly non-focal and symmetrical throughout             Skin:                            Clean and dry, no rashes, no breakdown    Diagnostic Tests:  Transthoracic Echocardiography  Patient:    [name], [name] MR #:       [id number] Study Date: 06/20/2012 Gender:     M Age:        66 Height:     182.9cm Weight:     100.2kg BSA:        2.34m^2 Pt. Status: Room:    ATTENDING    Willa Rough  PERFORMING   [name]  SONOGRAPHER  Philomena Course, RDCS  ORDERING     [name], Christopher  Myna Hidalgo, Christopher cc:  ------------------------------------------------------------ LV EF: 60% -   65%  ------------------------------------------------------------ Indications:      Mitral regurgitation 424.0.  ------------------------------------------------------------ History:   PMH:  Peripheral vascular disease. Pulmonary emboli. Acquired from the patient and from the patient's chart.  Dyspnea.  Coronary artery disease.  Moderate mitral regurgitation.  Risk factors:  Former tobacco use. Hypertension. Dyslipidemia.  ------------------------------------------------------------ Study Conclusions  - Left ventricle: The cavity size was moderately dilated.   Wall thickness was increased in a pattern of mild LVH.   Systolic function was normal. The estimated ejection   fraction was in the range of 60% to 65%. Wall motion was   normal; there were no regional wall motion abnormalities. - Mitral valve: The is flail of  the posterior leaflet with   very severe MR. - Left atrium: The atrium was  moderately dilated. - Pulmonary arteries: PA peak pressure: 32mm Hg (S). - Impressions: SINCE 10/2010, THERE IS NOW FLAIL OF THE   POSTERIOR MITRAL LEAFLET WITH SEVERE MR. Impressions:  - SINCE 10/2010, THERE IS NOW FLAIL OF THE POSTERIOR MITRAL   LEAFLET WITH SEVERE MR.  ------------------------------------------------------------ Labs, prior tests, procedures, and surgery: Catheterization (August 2009).  Transthoracic echocardiography.  M-mode, complete 2D, spectral Doppler, and color Doppler.  Height:  Height: 182.9cm. Height: 72in.  Weight:  Weight: 100.2kg. Weight: 220.5lb.  Body mass index:  BMI: 30kg/m^2.  Body surface area:    BSA: 2.27m^2.  Blood pressure:     134/77.  Patient status:  Outpatient.  Location:  [facility]  ------------------------------------------------------------  ------------------------------------------------------------ Left ventricle:  The cavity size was moderately dilated. Wall thickness was increased in a pattern of mild LVH. Systolic function was normal. The estimated ejection fraction was in the range of 60% to 65%. Wall motion was normal; there were no regional wall motion abnormalities.   ------------------------------------------------------------ Aortic valve:   Structurally normal valve.   Cusp separation was normal.  Doppler:  Transvalvular velocity was within the normal range. There was no stenosis.  No regurgitation.  ------------------------------------------------------------ Aorta:  Aortic root: The aortic root was normal in size.  ------------------------------------------------------------ Mitral valve:  The is flail of the posterior leaflet with very severe MR.  Doppler:     Peak gradient: 8mm Hg (D).  ------------------------------------------------------------ Left atrium:  The atrium was moderately dilated.  ------------------------------------------------------------ Right ventricle:  The cavity size was normal.  Systolic function was normal.  ------------------------------------------------------------ Pulmonic valve:    The valve appears to be grossly normal.  Doppler:   No significant regurgitation.  ------------------------------------------------------------ Tricuspid valve:   Structurally normal valve.   Leaflet separation was normal.  Doppler:  Transvalvular velocity was within the normal range.  Mild regurgitation.  ------------------------------------------------------------ Right atrium:  The atrium was normal in size.  ------------------------------------------------------------ Pericardium:  There was no pericardial effusion.  ------------------------------------------------------------ Systemic veins: Inferior vena cava: The vessel was normal in size; the respirophasic diameter changes were in the normal range (= 50%); findings are consistent with normal central venous pressure.  ------------------------------------------------------------  2D measurements        Normal  Doppler measurements    Norma Left ventricle                                         l LVID ED,   62.1 mm     43-52   Main pulmonary artery chord,                         Pressure,   32 mm Hg    =30 PLAX                           S LVID ES,   45.1 mm     23-38   Left ventricle chord,                         Ea, lat    15. cm/s     ----- PLAX  ann, tiss    5 FS, chord,   27 %      >29     DP PLAX                           E/Ea, lat  8.9          ----- LVPW, ED   12.1 mm     ------  ann, tiss    7 IVS/LVPW   1.05        <1.3    DP ratio, ED                      Ea, med    6.8 cm/s     ----- Ventricular septum             ann, tiss    2 IVS, ED    12.7 mm     ------  DP LVOT                           E/Ea, med  20.          ----- Diam, S      23 mm     ------  ann, tiss   38 Area       4.15 cm^2   ------  DP Diam         23 mm     ------  LVOT Aorta                          Peak  vel,  98. cm/s     ----- Root diam,   36 mm     ------  S            7 ED                             VTI, S     14. cm       ----- Left atrium                                 7 AP dim       49 mm     ------  HR          66 bpm      ----- AP dim     2.21 cm/m^2 <2.2    Stroke vol 61. ml       ----- index                                       1                                Cardiac      4 L/min    -----                                output  Cardiac    1.8 L/(min-m -----                                index          ^2)                                Stroke     27. ml/m^2   -----                                index        5                                Mitral valve                                Peak E vel 139 cm/s     -----                                Peak A vel 65. cm/s     -----                                             4                                Decelerati 173 ms       150-2                                on time                 30                                Peak         8 mm Hg    -----                                gradient,                                D                                Peak E/A   2.1          -----                                ratio  Regurg     56. cm/s     -----                                alias vel,   2                                PISA                                Max regurg 518 cm/s     -----                                vel                                Regurg VTI 136 cm       -----                                ERO, PISA  0.5 cm^2     -----                                             5                                Regurg      75 ml       -----                                vol, PISA                                RF, PISA   55. %        -----                                            12                                Tricuspid valve                                 Regurg     259 cm/s     -----                                peak vel                                Peak RV-RA  27 mm Hg    -----  gradient,                                S                                Systemic veins                                Estimated    5 mm Hg    -----                                CVP                                Right ventricle                                Pressure,   32 mm Hg    <30                                S   ------------------------------------------------------------ Prepared and Electronically Authenticated by  Willa Rough 2014-04-01T11:[id number]    Transesophageal Echocardiography  Patient:    [name], [name] MR #:       [id number] Study Date: 07/04/2012 Gender:     M Age:        66 Height:     182.9cm Weight:     99.5kg BSA:        2.91m^2 Pt. Status: Room:       [facility]    SONOGRAPHER  Perley Jain, RDCS  ADMITTING    Marca Ancona  ATTENDING    [name], Dalton  PERFORMING   [name], Dalton  ORDERING     McAlhany, Christopher  REFERRING    Marcus Carrow cc:  ------------------------------------------------------------ LV EF: 55% -   60%  ------------------------------------------------------------ Indications:      Mitral regurgitation 424.0.  ------------------------------------------------------------ Study Conclusions  - Left ventricle: The cavity size was mildly dilated. Wall   thickness was normal. Systolic function was normal. The   estimated ejection fraction was in the range of 55% to   60%. - Aortic valve: There was no stenosis. - Mitral valve: Severe MR with flail P2 segment. PISA ERO   0.53 cm^2. Pulmonary vein doppler interrogation shows   systolic blunting but not reversal. - Left atrium: The atrium was moderately dilated. No   evidence of thrombus in the atrial cavity or appendage. - Right ventricle: The cavity size was normal. Systolic   function was  normal. - Right atrium: No evidence of thrombus in the atrial cavity   or appendage. - Atrial septum: No defect or patent foramen ovale was   identified. Echo contrast study showed no right-to-left   atrial level shunt, at baseline or with provocation. Transesophageal echocardiography.  2D and color Doppler. Height:  Height: 182.9cm. Height: 72in.  Weight:  Weight: 99.5kg. Weight: 219lb.  Body mass index:  BMI: 29.8kg/m^2. Body surface area:    BSA: 2.35m^2.  Blood pressure: 123/75.  Patient status:  Outpatient.  Location:  Endoscopy.   ------------------------------------------------------------  ------------------------------------------------------------ Left  ventricle:  The cavity size was mildly dilated. Wall thickness was normal. Systolic function was normal. The estimated ejection fraction was in the range of 55% to 60%.   ------------------------------------------------------------ Aortic valve:   Trileaflet.  Doppler:   There was no stenosis.    No regurgitation.  ------------------------------------------------------------ Mitral valve:  Normal caliber, mild plaque descending thoracic aorta. Severe MR with flail P2 segment. PISA ERO 0.53 cm^2. Pulmonary vein doppler interrogation shows systolic blunting but not reversal.  ------------------------------------------------------------ Left atrium:  The atrium was moderately dilated.  No evidence of thrombus in the atrial cavity or appendage.  ------------------------------------------------------------ Atrial septum:  No defect or patent foramen ovale was identified.  Echo contrast study showed no right-to-left atrial level shunt, at baseline or with provocation.  ------------------------------------------------------------ Right ventricle:  The cavity size was normal. Systolic function was normal.  ------------------------------------------------------------ Pulmonic valve:    Structurally normal valve.    Cusp separation was normal.  ------------------------------------------------------------ Tricuspid valve:   Doppler:   Trivial regurgitation.  ------------------------------------------------------------ Right atrium:  The atrium was normal in size.  No evidence of thrombus in the atrial cavity or appendage.  ------------------------------------------------------------ Pericardium:  There was no pericardial effusion.  ------------------------------------------------------------  Doppler measurements   Normal Mitral valve Regurg      38.5 cm/s  ------ alias vel, PISA Max regurg   550 cm/s  ------ vel Regurg VTI   160 cm    ------ ERO, PISA   0.53 cm^2  ------ Regurg        85 ml    ------ vol, PISA   ------------------------------------------------------------ Prepared and Electronically Authenticated by  Marca Ancona 2014-04-15T23:15:[id number]     Cardiac Catheterization Operative Report  [name] [id number] 4/25/201411:50 AM MCKEOWN,WILLIAM DAVID, Griffin  Procedure Performed:   1. Left Heart Catheterization 2. Selective Coronary Angiography 3. Right Heart Catheterization 4. Left ventricular angiogram 5. Distal aortogram with visualization of both iliac arteries.  Operator: Marcus Carrow, Griffin  Indication:  67 yo AAM with h/o mitral regurgitation, HTN, hyperlipidemia, non-obstructive CAD by cath 8/09 and mild LV dysfunction who had a hip replacement 9/09 and presented with SOB 3/10 and was found to have bilateral pulmonary emboli. Repeat CT chest 11/14/08 with no evidence of PE. Lower extremity venous dopplers 9/10 with no evidence of DVT. Coumadin therapy was stopped 9/10. Echo 11/28/08 with normal LV size and systolic function with grade 2 diastolic dysfunction and mild to moderate MR, trivial AI. Readmitted to [facility] October 31, 2010 with SOB and found to have recurrent Pulmonary Embolism by CTA. He was restarted on coumadin in the hospital. He has been  maintained on coumadin since then. Echo 10/31/10 with normal LV size and function, moderate MR. Repeat echo 06/20/12 showed dilated LV, normal LV function, severe MR secondary to flail posterior MV leaflet. TEE on 07/04/12 with severe MR with flail P2 segment. PISA ERO 0.53 cm^2. Pulmonary vein doppler interrogation shows systolic blunting but not reversal. Moderate dilation left atrium. Cardiac cath today to assess coronary arteries and pressures prior to surgical referral.                Procedure Details: The risks, benefits, complications, treatment options, and expected outcomes were discussed with the patient. The patient and/or family concurred with the proposed Griffin, giving informed consent. The patient was brought to the cath lab after IV hydration was begun and oral premedication was given. The patient was further sedated with Versed and Fentanyl. The left groin was prepped and draped in  the usual manner. Using the modified Seldinger access technique, a 4 French sheath was placed in the left femoral artery. A 6 French sheath was inserted into the left femoral vein. A multi-purpose catheter was used to perform a right heart catheterization. Standard diagnostic catheters were used to perform selective coronary angiography. A pigtail catheter was used to perform a left ventricular angiogram. The pigtail catheter was pulled back and a distal aortogram was performed.  There were no immediate complications. The patient was taken to the recovery area in stable condition.   Hemodynamic Findings: Ao:  94/60         LV:94/1/15 RA: 1               RV: 42/1/17 PA:   45/4 (mean 24)      PCWP: 8  Fick Cardiac Output: 5 L/min Fick Cardiac Index: 2.3 L/min/m2 Central Aortic Saturation: 96% Pulmonary Artery Saturation: 66%   Angiographic Findings:  Left main: No obstructive disease.   Left Anterior Descending Artery: Large caliber vessel that courses to the apex. The mid LAD has mild luminal  irregularities. There is a small caliber intermediate branch with mild plaque disease. The first diagonal branch is a moderate caliber bifurcating vessel with 40% proximal stenosis.    Circumflex Artery: Large caliber vessel with very small caliber first obtuse marginal branch. The proximal and mid AV groove Circumflex has mild luminal irregularities. The second obtuse marginal branch is moderate in caliber and has a focal 30% stenosis. The left sided posterolateral branch has mild plaque disease.    Right Coronary Artery: Moderate caliber non-dominant vessel with mild luminal irregularities.   Distal Aortogram: No evidence of obstructive disease or aneurysm.   Left Ventricular Angiogram: LVEF 50-55%. 3+ MR.   Impression: 1. Mild non-obstructive CAD 2. Preserved LV systolic function 3. Severe mitral valve regurgitation  Recommendations: He has severe MR secondary to flail P2 segment of mitral valve. He is asymptomatic at this time. Given his severe MR, will refer to see Marcus. Tressie Stalker in the CT surgery office for consideration of valve repair.          Complications:  None; patient tolerated the procedure well.             Impression:  Patient has mitral valve prolapse with a large flail segment of the posterior leaflet and severe mitral regurgitation. There is mild to moderate left ventricular chamber enlargement with reasonably well-preserved systolic function. There is mild pulmonary hypertension. 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 The patient is symptomatic with stable symptoms of exertional shortness of breath and orthopnea. I agree that elective mitral valve repair is indicated, and the patient appears to be fairly good candidate for minimally invasive approach for surgery.   Griffin:  The rationale for elective mitral valve repair surgery has been explained,  including a comparison between surgery and continued medical therapy with close follow-up.  The likelihood of successful and durable valve repair has been discussed with particular reference to the findings of their recent echocardiogram.  Based upon these findings and previous experience, I have quoted them a greater than 95 percent likelihood of successful valve repair.  In the unlikely event that their valve cannot be successfully repaired, we discussed the possibility of replacing the mitral valve using a mechanical prosthesis with the attendant need for long-term anticoagulation versus the alternative of replacing it using a  bioprosthetic tissue valve with its potential for late structural valve deterioration and failure, depending upon the patient's longevity.  The patient specifically requests that if the mitral valve must be replaced that it be done using a mechanical valve.  Alternative surgical approaches have been discussed, including a comparison between conventional sternotomy and minimally-invasive techniques.  The relative risks and benefits of each have been reviewed as they pertain to the patient's specific circumstances.  All questions have been addressed.  The patient is eager to proceed with surgery in the near future. We tentatively Griffin for minimally invasive mitral valve repair on Tuesday, May 27.   The patient and his wife both understand and accept all potential associated risks of surgery including but not limited to risk of death, stroke, myocardial infarction, congestive heart failure, respiratory failure, renal failure, bleeding requiring blood transfusion and/or reexploration, arrhythmia, heart block or bradycardia requiring permanent pacemaker, pneumonia, pleural effusion, wound infection, pulmonary embolus or other thromboembolic complication, chronic pain or other delayed complications related to valve repair or the minimally invasive approach for surgery.  All questions  answered.    Salvatore Decent. Cornelius Moras, Griffin 08/10/2012 3:23 PM

## 2012-08-10 NOTE — Progress Notes (Signed)
[address]            [name] [address]          [phone]     CARDIOTHORACIC SURGERY OFFICE NOTE  Referring Provider is Verne Carrow, MD PCP is Nadean Corwin, MD   HPI:  Patient returns to the office today with tentatively plans to proceed with minimally invasive mitral valve repair next week. He was originally seen in consultation on 07/27/2012.  He reports no new problems or complaints since his last office visit. He stopped taking Coumadin and begin taking amiodarone 2 days ago, and he is now doing daily Lovenox injections as a bridge between now and the time of surgery.    Current Outpatient Prescriptions  Medication Sig Dispense Refill  . amiodarone (PACERONE) 200 MG tablet Take 1 tablet (200 mg total) by mouth 2 (two) times daily. Begin 7 days prior to surgery.  14 tablet  0  . benazepril (LOTENSIN) 20 MG tablet Take 10 mg by mouth every evening.       . enoxaparin (LOVENOX) 100 MG/ML injection Inject 1 mL (100 mg total) into the skin every 12 (twelve) hours.  10 Syringe  1  . fenofibrate micronized (LOFIBRA) 134 MG capsule Take 134 mg by mouth every evening.       . fexofenadine (ALLEGRA) 30 MG tablet Take 30 mg by mouth daily.      Marland Kitchen guaiFENesin (MUCINEX) 600 MG 12 hr tablet Take 600 mg by mouth 2 (two) times daily as needed for congestion.       Marland Kitchen warfarin (COUMADIN) 5 MG tablet Take 5-10 mg by mouth daily. 5mg  on tue, thu, sat and 10 mg on mon, wed, fri, sun      . [DISCONTINUED] loratadine (CLARITIN) 10 MG tablet Take 10 mg by mouth daily.         No current facility-administered medications for this visit.   Facility-Administered Medications Ordered in Other Visits  Medication Dose Route Frequency Provider Last Rate Last Dose  . chlorhexidine (HIBICLENS) 4 % liquid 2 application  30 mL Topical UD Purcell Nails, MD          Physical Exam:   BP 129/71  Pulse 84  Resp 20  Ht 6' (1.829 m)  Wt 219 lb (99.338  kg)  BMI 29.7 kg/m2  SpO2 97%  General:  Well-appearing  Chest:   Clear to auscultation with symmetrical breath sounds  CV:   Regular rate and rhythm with prominent systolic murmur  Incisions:  n/a  Abdomen:  Soft and nontender  Extremities:  Warm and well-perfused  Diagnostic Tests:  n/a   Impression:  Patient has mitral valve prolapse with a large flail segment of the posterior leaflet and severe mitral regurgitation. There is mild to moderate left ventricular chamber enlargement with reasonably well-preserved systolic function. There is mild pulmonary hypertension. 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 The patient is symptomatic with stable symptoms of exertional shortness of breath and orthopnea. I agree that elective mitral valve repair is indicated, and the patient appears to be fairly good candidate for minimally invasive approach for surgery.   Plan:  I have again reviewed the indications, risks, and potential benefits of surgery with the patient and his wife.  They understand and accept  all potential associated risks of surgery including but not limited to risk of death, stroke, myocardial infarction, congestive heart failure, respiratory failure, renal failure, bleeding requiring blood transfusion and/or reexploration, arrhythmia, heart block or bradycardia requiring permanent pacemaker, pneumonia, pleural effusion, wound infection, pulmonary embolus or other thromboembolic complication, chronic pain or other delayed complications related to valve repair or the minimally invasive approach for surgery.  All questions answered.    Salvatore Decent. Cornelius Moras, MD 08/10/2012 3:23 PM

## 2012-08-10 NOTE — Pre-Procedure Instructions (Signed)
[name]  08/10/2012   Your procedure is scheduled on:  08/15/12  Report to [name] at 530 AM.  Call this number if you have problems the morning of surgery: [phone]   Remember:   Do not eat food or drink liquids after midnight.   Take these medicines the morning of surgery with A SIP OF WATER: amiodarone   Do not wear jewelry, make-up or nail polish.  Do not wear lotions, powders, or perfumes. You may wear deodorant.  Do not shave 48 hours prior to surgery. Men may shave face and neck.  Do not bring valuables to the hospital.  Contacts, dentures or bridgework may not be worn into surgery.  Leave suitcase in the car. After surgery it may be brought to your room.  For patients admitted to the hospital, checkout time is 11:00 AM the day of  discharge.   Patients discharged the day of surgery will not be allowed to drive  home.  Name and phone number of your driver: family  Special Instructions: Shower using CHG 2 nights before surgery and the night before surgery.  If you shower the day of surgery use CHG.  Use special wash - you have one bottle of CHG for all showers.  You should use approximately 1/3 of the bottle for each shower.   Please read over the following fact sheets that you were given: Pain Booklet, Coughing and Deep Breathing, Blood Transfusion Information, MRSA Information and Surgical Site Infection Prevention

## 2012-08-10 NOTE — Progress Notes (Addendum)
Pre-op Cardiac Surgery  Carotid Findings:  There is no obvious evidence of hemodynamically significant internal carotid artery stenosis. Vertebral arteries are patent with antegrade flow.   Upper Extremity Right Left  Brachial Pressures 123-Triphasic 123-Triphasic  Radial Waveforms Triphasic Triphasic  Ulnar Waveforms Triphasic Triphasic  Palmar Arch (Allen's Test) Signal obliterates with radial compression, is unaffected with ulnar compression. Signal obliterates with radial compression, is unaffected with ulnar compression.   08/10/2012 12:07 PM Gertie Fey, RDMS, RDCS

## 2012-08-10 NOTE — Progress Notes (Signed)
Anesthesia Chart Review:  Patient is a 67 year old male scheduled for minimally invasive MV repair by Dr. Cornelius Moras on 08/15/12.  History includes severe MR, former smoker, HTN, HLD, OSA, PVD, PE  '10 and '12, OA, mild non-obstructive CAD by 06/2012 cath. PCP is Dr. Lucky Cowboy.  Cardiologist is Dr. Clifton James.  Cardiac cath on 07/14/12 showed mild non-obstructive CAD (40% proximal D1, 30% OM2), LVEF 55%, severe MR.  Echo on 06/20/2012 demonstrated EF 60-65% with severe mitral regurgitation with what appeared to be a flail posterior leaflet. This was confirmed by transesophageal echocardiogram performed 07/04/2012 by Dr. Shirlee Latch. (See reports under Results Review tab.)   Carotid duplex on 08/10/12 showed no significant extracranial carotid artery stenosis demonstrated.  Antegrade vertebral flow.    Preoperative CXR and EKG noted.  Preoperative labs reviewed.  Cr 1.49.  PT/PTT elevated. Plan repeat PT/PTT on arrival.  He is on a Lovenox bridge while off of his Coumadin.  If follow-up labs are reasonable and otherwise no significant changes then would anticipate that he could proceed as planned.  Velna Ochs [facility]/Anesthesiology Phone [phone] 08/10/2012 5:21 PM

## 2012-08-14 MED ORDER — DEXTROSE 5 % IV SOLN
1.5000 g | INTRAVENOUS | Status: AC
  Administered 2012-08-15: 1.5 g via INTRAVENOUS
  Administered 2012-08-15: .75 g via INTRAVENOUS
  Filled 2012-08-14: qty 1.5

## 2012-08-14 MED ORDER — DEXTROSE 5 % IV SOLN
0.5000 ug/min | INTRAVENOUS | Status: DC
  Filled 2012-08-14: qty 4

## 2012-08-14 MED ORDER — DOPAMINE-DEXTROSE 3.2-5 MG/ML-% IV SOLN
2.0000 ug/kg/min | INTRAVENOUS | Status: DC
  Filled 2012-08-14: qty 250

## 2012-08-14 MED ORDER — MAGNESIUM SULFATE 50 % IJ SOLN
40.0000 meq | INTRAMUSCULAR | Status: DC
  Filled 2012-08-14: qty 10

## 2012-08-14 MED ORDER — DEXMEDETOMIDINE HCL IN NACL 400 MCG/100ML IV SOLN
0.1000 ug/kg/h | INTRAVENOUS | Status: AC
  Administered 2012-08-15: 14:00:00 via INTRAVENOUS
  Administered 2012-08-15: 0.3 ug/kg/h via INTRAVENOUS
  Filled 2012-08-14: qty 100

## 2012-08-14 MED ORDER — METOPROLOL TARTRATE 12.5 MG HALF TABLET: 12.5000 mg | ORAL_TABLET | Freq: Once | ORAL | Status: DC

## 2012-08-14 MED ORDER — DEXTROSE 5 % IV SOLN
30.0000 ug/min | INTRAVENOUS | Status: AC
  Administered 2012-08-15: 20 ug/min via INTRAVENOUS
  Filled 2012-08-14: qty 2

## 2012-08-14 MED ORDER — CEFUROXIME SODIUM 750 MG IJ SOLR
750.0000 mg | INTRAMUSCULAR | Status: DC
  Filled 2012-08-14: qty 750

## 2012-08-14 MED ORDER — SODIUM CHLORIDE 0.9 % IV SOLN
INTRAVENOUS | Status: DC
  Filled 2012-08-14: qty 30

## 2012-08-14 MED ORDER — SODIUM CHLORIDE 0.9 % IV SOLN
INTRAVENOUS | Status: AC
  Administered 2012-08-15: 2 [IU]/h via INTRAVENOUS
  Filled 2012-08-14: qty 1

## 2012-08-14 MED ORDER — POTASSIUM CHLORIDE 2 MEQ/ML IV SOLN
80.0000 meq | INTRAVENOUS | Status: DC
  Filled 2012-08-14: qty 40

## 2012-08-14 MED ORDER — NITROGLYCERIN IN D5W 200-5 MCG/ML-% IV SOLN
2.0000 ug/min | INTRAVENOUS | Status: AC
  Administered 2012-08-15: 5 ug/min via INTRAVENOUS
  Filled 2012-08-14: qty 250

## 2012-08-14 MED ORDER — VANCOMYCIN HCL 10 G IV SOLR
1250.0000 mg | INTRAVENOUS | Status: AC
  Administered 2012-08-15: 1250 mg via INTRAVENOUS
  Filled 2012-08-14 (×2): qty 1250

## 2012-08-14 MED ORDER — SODIUM CHLORIDE 0.9 % IV SOLN
INTRAVENOUS | Status: AC
  Administered 2012-08-15: 70 mL/h via INTRAVENOUS
  Administered 2012-08-15: 13:00:00 via INTRAVENOUS
  Filled 2012-08-14: qty 40

## 2012-08-14 MED ORDER — VANCOMYCIN HCL 1000 MG IV SOLR
INTRAVENOUS | Status: AC
  Filled 2012-08-14: qty 1000

## 2012-08-14 MED ORDER — PAPAVERINE HCL 30 MG/ML IJ SOLN
INTRAMUSCULAR | Status: AC
  Administered 2012-08-15: 10:00:00
  Filled 2012-08-14 (×2): qty 2.5

## 2012-08-15 ENCOUNTER — Encounter (HOSPITAL_COMMUNITY): Payer: Self-pay | Admitting: Vascular Surgery

## 2012-08-15 ENCOUNTER — Inpatient Hospital Stay (HOSPITAL_COMMUNITY): Payer: 59

## 2012-08-15 ENCOUNTER — Ambulatory Visit (HOSPITAL_COMMUNITY): Payer: 59 | Admitting: Anesthesiology

## 2012-08-15 ENCOUNTER — Encounter (HOSPITAL_COMMUNITY): Payer: Self-pay | Admitting: Anesthesiology

## 2012-08-15 ENCOUNTER — Inpatient Hospital Stay (HOSPITAL_COMMUNITY)
Admission: RE | Admit: 2012-08-15 | Discharge: 2012-08-19 | DRG: 221 | Disposition: A | Discharge status: Home / Self Care | Payer: 59 | Source: Ambulatory Visit | Attending: Thoracic Surgery (Cardiothoracic Vascular Surgery) | Admitting: Thoracic Surgery (Cardiothoracic Vascular Surgery)

## 2012-08-15 ENCOUNTER — Encounter (HOSPITAL_COMMUNITY)
Admission: RE | Disposition: A | Discharge status: Home / Self Care | Payer: Self-pay | Source: Ambulatory Visit | Attending: Thoracic Surgery (Cardiothoracic Vascular Surgery)

## 2012-08-15 DIAGNOSIS — I739 Peripheral vascular disease, unspecified: Secondary | ICD-10-CM | POA: Diagnosis present

## 2012-08-15 DIAGNOSIS — G4730 Sleep apnea, unspecified: Secondary | ICD-10-CM | POA: Diagnosis present

## 2012-08-15 DIAGNOSIS — E785 Hyperlipidemia, unspecified: Secondary | ICD-10-CM | POA: Diagnosis present

## 2012-08-15 DIAGNOSIS — Z79899 Other long term (current) drug therapy: Secondary | ICD-10-CM

## 2012-08-15 DIAGNOSIS — I2510 Atherosclerotic heart disease of native coronary artery without angina pectoris: Secondary | ICD-10-CM | POA: Diagnosis not present

## 2012-08-15 DIAGNOSIS — M159 Polyosteoarthritis, unspecified: Secondary | ICD-10-CM | POA: Diagnosis present

## 2012-08-15 DIAGNOSIS — I517 Cardiomegaly: Secondary | ICD-10-CM | POA: Diagnosis present

## 2012-08-15 DIAGNOSIS — R918 Other nonspecific abnormal finding of lung field: Secondary | ICD-10-CM | POA: Diagnosis not present

## 2012-08-15 DIAGNOSIS — R7309 Other abnormal glucose: Secondary | ICD-10-CM | POA: Diagnosis present

## 2012-08-15 DIAGNOSIS — Z7901 Long term (current) use of anticoagulants: Secondary | ICD-10-CM

## 2012-08-15 DIAGNOSIS — Z86711 Personal history of pulmonary embolism: Secondary | ICD-10-CM

## 2012-08-15 DIAGNOSIS — I4949 Other premature depolarization: Secondary | ICD-10-CM | POA: Diagnosis present

## 2012-08-15 DIAGNOSIS — I10 Essential (primary) hypertension: Secondary | ICD-10-CM | POA: Diagnosis present

## 2012-08-15 DIAGNOSIS — Z96649 Presence of unspecified artificial hip joint: Secondary | ICD-10-CM

## 2012-08-15 DIAGNOSIS — I059 Rheumatic mitral valve disease, unspecified: Secondary | ICD-10-CM

## 2012-08-15 DIAGNOSIS — R0602 Shortness of breath: Secondary | ICD-10-CM | POA: Diagnosis present

## 2012-08-15 DIAGNOSIS — Z9889 Other specified postprocedural states: Secondary | ICD-10-CM

## 2012-08-15 HISTORY — DX: Other specified postprocedural states: Z98.890

## 2012-08-15 HISTORY — PX: MITRAL VALVE REPAIR: SHX2039

## 2012-08-15 HISTORY — PX: INTRAOPERATIVE TRANSESOPHAGEAL ECHOCARDIOGRAM: SHX5062

## 2012-08-15 LAB — POCT I-STAT 4, (NA,K, GLUC, HGB,HCT)
Glucose, Bld: 138 mg/dL — ABNORMAL HIGH (ref 70–99)
Glucose, Bld: 146 mg/dL — ABNORMAL HIGH (ref 70–99)
Glucose, Bld: 137 mg/dL — ABNORMAL HIGH (ref 70–99)
Glucose, Bld: 140 mg/dL — ABNORMAL HIGH (ref 70–99)
HCT: 38 % — ABNORMAL LOW (ref 39.0–52.0)
HCT: 37 % — ABNORMAL LOW (ref 39.0–52.0)
HCT: 30 % — ABNORMAL LOW (ref 39.0–52.0)
HCT: 26 % — ABNORMAL LOW (ref 39.0–52.0)
Hemoglobin: 12.9 g/dL — ABNORMAL LOW (ref 13.0–17.0)
Hemoglobin: 10.2 g/dL — ABNORMAL LOW (ref 13.0–17.0)
Hemoglobin: 8.8 g/dL — ABNORMAL LOW (ref 13.0–17.0)
Potassium: 3.8 mEq/L (ref 3.5–5.1)
Potassium: 3.8 mEq/L (ref 3.5–5.1)
Sodium: 140 mEq/L (ref 135–145)
Sodium: 138 mEq/L (ref 135–145)
Sodium: 140 mEq/L (ref 135–145)

## 2012-08-15 LAB — PROTIME-INR
INR: 1.1 (ref 0.00–1.49)
Prothrombin Time: 14.1 seconds (ref 11.6–15.2)
Prothrombin Time: 17.2 seconds — ABNORMAL HIGH (ref 11.6–15.2)

## 2012-08-15 LAB — POCT I-STAT 3, ART BLOOD GAS (G3+)
Acid-Base Excess: 1 mmol/L (ref 0.0–2.0)
Bicarbonate: 24.2 mEq/L — ABNORMAL HIGH (ref 20.0–24.0)
O2 Saturation: 100 %
O2 Saturation: 97 %
Patient temperature: 34.6
TCO2: 25 mmol/L (ref 0–100)
TCO2: 20 mmol/L (ref 0–100)
pCO2 arterial: 57.7 mmHg (ref 35.0–45.0)
pH, Arterial: 7.479 — ABNORMAL HIGH (ref 7.350–7.450)
pO2, Arterial: 406 mmHg — ABNORMAL HIGH (ref 80.0–100.0)

## 2012-08-15 LAB — CBC
HCT: 32.5 % — ABNORMAL LOW (ref 39.0–52.0)
MCH: 30.4 pg (ref 26.0–34.0)
MCHC: 35.1 g/dL (ref 30.0–36.0)
MCV: 87.3 fL (ref 78.0–100.0)
Platelets: 129 10*3/uL — ABNORMAL LOW (ref 150–400)
RBC: 3.85 MIL/uL — ABNORMAL LOW (ref 4.22–5.81)
RDW: 13.5 % (ref 11.5–15.5)
WBC: 8.3 10*3/uL (ref 4.0–10.5)

## 2012-08-15 LAB — POCT I-STAT 3, VENOUS BLOOD GAS (G3P V)
Acid-base deficit: 3 mmol/L — ABNORMAL HIGH (ref 0.0–2.0)
Bicarbonate: 21.5 mEq/L (ref 20.0–24.0)
O2 Saturation: 90 %
pO2, Ven: 49 mmHg — ABNORMAL HIGH (ref 30.0–45.0)

## 2012-08-15 LAB — POCT I-STAT, CHEM 8
Calcium, Ion: 1.18 mmol/L (ref 1.13–1.30)
Chloride: 109 mEq/L (ref 96–112)
Glucose, Bld: 119 mg/dL — ABNORMAL HIGH (ref 70–99)
HCT: 31 % — ABNORMAL LOW (ref 39.0–52.0)
Hemoglobin: 10.5 g/dL — ABNORMAL LOW (ref 13.0–17.0)
TCO2: 23 mmol/L (ref 0–100)

## 2012-08-15 LAB — GLUCOSE, CAPILLARY
Glucose-Capillary: 89 mg/dL (ref 70–99)
Glucose-Capillary: 87 mg/dL (ref 70–99)
Glucose-Capillary: 114 mg/dL — ABNORMAL HIGH (ref 70–99)
Glucose-Capillary: 117 mg/dL — ABNORMAL HIGH (ref 70–99)
Glucose-Capillary: 112 mg/dL — ABNORMAL HIGH (ref 70–99)

## 2012-08-15 LAB — HEMOGLOBIN AND HEMATOCRIT, BLOOD: HCT: 25.9 % — ABNORMAL LOW (ref 39.0–52.0)

## 2012-08-15 LAB — APTT
aPTT: 29 seconds (ref 24–37)
aPTT: 32 seconds (ref 24–37)

## 2012-08-15 LAB — CREATININE, SERUM: Creatinine, Ser: 1.44 mg/dL — ABNORMAL HIGH (ref 0.50–1.35)

## 2012-08-15 SURGERY — REPAIR, MITRAL VALVE, MINIMALLY INVASIVE
Anesthesia: General | Site: Chest | Laterality: Right | Wound class: Clean

## 2012-08-15 MED ORDER — METOPROLOL TARTRATE 1 MG/ML IV SOLN: 2.5000 mg | INTRAVENOUS | Status: DC | PRN

## 2012-08-15 MED ORDER — MIDAZOLAM HCL 5 MG/5ML IJ SOLN
INTRAMUSCULAR | Status: DC | PRN
  Administered 2012-08-15 (×10): 2 mg via INTRAVENOUS

## 2012-08-15 MED ORDER — ROCURONIUM BROMIDE 100 MG/10ML IV SOLN
INTRAVENOUS | Status: DC | PRN
  Administered 2012-08-15: 100 mg via INTRAVENOUS

## 2012-08-15 MED ORDER — SODIUM CHLORIDE 0.9 % IV SOLN
INTRAVENOUS | Status: DC | PRN
  Administered 2012-08-15: 14:00:00 via INTRAVENOUS

## 2012-08-15 MED ORDER — FAMOTIDINE IN NACL 20-0.9 MG/50ML-% IV SOLN: 20.0000 mg | Freq: Two times a day (BID) | INTRAVENOUS | Status: DC

## 2012-08-15 MED ORDER — MAGNESIUM SULFATE 40 MG/ML IJ SOLN
4.0000 g | Freq: Once | INTRAMUSCULAR | Status: AC
  Administered 2012-08-15: 4 g via INTRAVENOUS
  Filled 2012-08-15: qty 100

## 2012-08-15 MED ORDER — BISACODYL 5 MG PO TBEC
10.0000 mg | DELAYED_RELEASE_TABLET | Freq: Every day | ORAL | Status: DC
  Administered 2012-08-16 – 2012-08-18 (×3): 10 mg via ORAL
  Filled 2012-08-15 (×4): qty 2

## 2012-08-15 MED ORDER — SODIUM CHLORIDE 0.9 % IV SOLN: 250.0000 mL | INTRAVENOUS | Status: DC

## 2012-08-15 MED ORDER — DEXTROSE 5 % IV SOLN
1.5000 g | Freq: Two times a day (BID) | INTRAVENOUS | Status: AC
  Administered 2012-08-15 – 2012-08-17 (×4): 1.5 g via INTRAVENOUS
  Filled 2012-08-15 (×5): qty 1.5

## 2012-08-15 MED ORDER — BISACODYL 10 MG RE SUPP: 10.0000 mg | Freq: Every day | RECTAL | Status: DC

## 2012-08-15 MED ORDER — ASPIRIN EC 325 MG PO TBEC
325.0000 mg | DELAYED_RELEASE_TABLET | Freq: Every day | ORAL | Status: DC
  Administered 2012-08-16: 325 mg via ORAL
  Filled 2012-08-15 (×2): qty 1

## 2012-08-15 MED ORDER — SODIUM CHLORIDE 0.9 % IV SOLN
INTRAVENOUS | Status: AC
  Administered 2012-08-15: 2.4 [IU]/h via INTRAVENOUS
  Filled 2012-08-15: qty 1

## 2012-08-15 MED ORDER — OXYCODONE HCL 5 MG PO TABS
5.0000 mg | ORAL_TABLET | ORAL | Status: DC | PRN
  Administered 2012-08-16 (×3): 5 mg via ORAL
  Administered 2012-08-17 – 2012-08-18 (×3): 10 mg via ORAL
  Filled 2012-08-15 (×4): qty 2
  Filled 2012-08-15 (×2): qty 1

## 2012-08-15 MED ORDER — LACTATED RINGERS IV SOLN
INTRAVENOUS | Status: DC | PRN
  Administered 2012-08-15 (×2): via INTRAVENOUS

## 2012-08-15 MED ORDER — PANTOPRAZOLE SODIUM 40 MG PO TBEC
40.0000 mg | DELAYED_RELEASE_TABLET | Freq: Every day | ORAL | Status: DC
  Administered 2012-08-17 – 2012-08-18 (×2): 40 mg via ORAL
  Filled 2012-08-15 (×2): qty 1

## 2012-08-15 MED ORDER — ARTIFICIAL TEARS OP OINT
TOPICAL_OINTMENT | OPHTHALMIC | Status: DC | PRN
  Administered 2012-08-15: 1 via OPHTHALMIC

## 2012-08-15 MED ORDER — ONDANSETRON HCL 4 MG/2ML IJ SOLN: 4.0000 mg | Freq: Four times a day (QID) | INTRAMUSCULAR | Status: DC | PRN

## 2012-08-15 MED ORDER — ALBUMIN HUMAN 5 % IV SOLN
250.0000 mL | INTRAVENOUS | Status: AC | PRN
  Administered 2012-08-15 (×4): 250 mL via INTRAVENOUS
  Filled 2012-08-15 (×2): qty 250

## 2012-08-15 MED ORDER — ACETAMINOPHEN 10 MG/ML IV SOLN
1000.0000 mg | Freq: Once | INTRAVENOUS | Status: AC
  Administered 2012-08-15: 1000 mg via INTRAVENOUS
  Filled 2012-08-15: qty 100

## 2012-08-15 MED ORDER — CALCIUM CHLORIDE 10 % IV SOLN
1.0000 g | Freq: Once | INTRAVENOUS | Status: AC | PRN
  Filled 2012-08-15: qty 10

## 2012-08-15 MED ORDER — ALBUMIN HUMAN 5 % IV SOLN
INTRAVENOUS | Status: DC | PRN
  Administered 2012-08-15 (×2): via INTRAVENOUS

## 2012-08-15 MED ORDER — ASPIRIN 81 MG PO CHEW: 324.0000 mg | CHEWABLE_TABLET | Freq: Every day | ORAL | Status: DC

## 2012-08-15 MED ORDER — NITROGLYCERIN IN D5W 200-5 MCG/ML-% IV SOLN
0.0000 ug/min | INTRAVENOUS | Status: DC
  Administered 2012-08-15: 10 ug/min via INTRAVENOUS

## 2012-08-15 MED ORDER — SODIUM CHLORIDE 0.9 % IV SOLN
1.0000 g/h | INTRAVENOUS | Status: DC
  Filled 2012-08-15: qty 20

## 2012-08-15 MED ORDER — HEPARIN SODIUM (PORCINE) 1000 UNIT/ML IJ SOLN
INTRAMUSCULAR | Status: DC | PRN
  Administered 2012-08-15: 31000 [IU] via INTRAVENOUS

## 2012-08-15 MED ORDER — SODIUM CHLORIDE 0.9 % IV SOLN
INTRAVENOUS | Status: DC
  Administered 2012-08-15: 20 mL/h via INTRAVENOUS

## 2012-08-15 MED ORDER — DEXMEDETOMIDINE HCL IN NACL 400 MCG/100ML IV SOLN
0.1000 ug/kg/h | INTRAVENOUS | Status: DC
  Filled 2012-08-15: qty 100

## 2012-08-15 MED ORDER — PROPOFOL 10 MG/ML IV BOLUS
INTRAVENOUS | Status: DC | PRN
  Administered 2012-08-15: 50 mg via INTRAVENOUS
  Administered 2012-08-15: 100 mg via INTRAVENOUS

## 2012-08-15 MED ORDER — SODIUM CHLORIDE 0.9 % IJ SOLN
3.0000 mL | Freq: Two times a day (BID) | INTRAMUSCULAR | Status: DC
  Administered 2012-08-16: 3 mL via INTRAVENOUS

## 2012-08-15 MED ORDER — FENTANYL CITRATE 0.05 MG/ML IJ SOLN
INTRAMUSCULAR | Status: DC | PRN
  Administered 2012-08-15: 450 ug via INTRAVENOUS
  Administered 2012-08-15: 50 ug via INTRAVENOUS
  Administered 2012-08-15: 500 ug via INTRAVENOUS
  Administered 2012-08-15 (×2): 250 ug via INTRAVENOUS

## 2012-08-15 MED ORDER — ACETAMINOPHEN 500 MG PO TABS
1000.0000 mg | ORAL_TABLET | Freq: Four times a day (QID) | ORAL | Status: DC
  Administered 2012-08-16 – 2012-08-19 (×12): 1000 mg via ORAL
  Filled 2012-08-15 (×18): qty 2

## 2012-08-15 MED ORDER — DOCUSATE SODIUM 100 MG PO CAPS
200.0000 mg | ORAL_CAPSULE | Freq: Every day | ORAL | Status: DC
  Administered 2012-08-16 – 2012-08-18 (×3): 200 mg via ORAL
  Filled 2012-08-15 (×3): qty 2

## 2012-08-15 MED ORDER — SODIUM CHLORIDE 0.9 % IJ SOLN: 3.0000 mL | INTRAMUSCULAR | Status: DC | PRN

## 2012-08-15 MED ORDER — PHENYLEPHRINE HCL 10 MG/ML IJ SOLN
0.0000 ug/min | INTRAVENOUS | Status: DC
  Administered 2012-08-15: 20 ug/min via INTRAVENOUS
  Filled 2012-08-15: qty 2

## 2012-08-15 MED ORDER — METOPROLOL TARTRATE 25 MG/10 ML ORAL SUSPENSION
12.5000 mg | Freq: Two times a day (BID) | ORAL | Status: DC
  Filled 2012-08-15 (×3): qty 5

## 2012-08-15 MED ORDER — POTASSIUM CHLORIDE 10 MEQ/50ML IV SOLN: 10.0000 meq | INTRAVENOUS | Status: AC

## 2012-08-15 MED ORDER — 0.9 % SODIUM CHLORIDE (POUR BTL) OPTIME
TOPICAL | Status: DC | PRN
  Administered 2012-08-15: 1000 mL

## 2012-08-15 MED ORDER — INSULIN REGULAR BOLUS VIA INFUSION
0.0000 [IU] | Freq: Three times a day (TID) | INTRAVENOUS | Status: DC
  Filled 2012-08-15: qty 10

## 2012-08-15 MED ORDER — SODIUM CHLORIDE 0.45 % IV SOLN
INTRAVENOUS | Status: DC
Start: 2012-08-15 — End: 2012-08-17
  Administered 2012-08-15: 20 mL/h via INTRAVENOUS

## 2012-08-15 MED ORDER — ACETAMINOPHEN 160 MG/5ML PO SOLN: 975.0000 mg | Freq: Four times a day (QID) | ORAL | Status: DC

## 2012-08-15 MED ORDER — PROTAMINE SULFATE 10 MG/ML IV SOLN
INTRAVENOUS | Status: DC | PRN
  Administered 2012-08-15: 30 mg via INTRAVENOUS
  Administered 2012-08-15: 120 mg via INTRAVENOUS
  Administered 2012-08-15: 150 mg via INTRAVENOUS

## 2012-08-15 MED ORDER — METOPROLOL TARTRATE 12.5 MG HALF TABLET
12.5000 mg | ORAL_TABLET | Freq: Two times a day (BID) | ORAL | Status: DC
  Filled 2012-08-15 (×3): qty 1

## 2012-08-15 MED ORDER — SODIUM CHLORIDE 0.9 % IR SOLN
Status: DC | PRN
  Administered 2012-08-15: 3000 mL
  Administered 2012-08-15: 1000 mL

## 2012-08-15 MED ORDER — MIDAZOLAM HCL 2 MG/2ML IJ SOLN
2.0000 mg | INTRAMUSCULAR | Status: DC | PRN
  Administered 2012-08-15: 2 mg via INTRAVENOUS
  Filled 2012-08-15: qty 2

## 2012-08-15 MED ORDER — VANCOMYCIN HCL IN DEXTROSE 1-5 GM/200ML-% IV SOLN
1000.0000 mg | Freq: Once | INTRAVENOUS | Status: AC
  Administered 2012-08-15: 1000 mg via INTRAVENOUS
  Filled 2012-08-15: qty 200

## 2012-08-15 MED ORDER — VANCOMYCIN HCL 1000 MG IV SOLR
INTRAVENOUS | Status: DC | PRN
  Administered 2012-08-15: 10:00:00

## 2012-08-15 MED ORDER — LACTATED RINGERS IV SOLN
INTRAVENOUS | Status: DC | PRN
  Administered 2012-08-15 (×2): via INTRAVENOUS

## 2012-08-15 MED ORDER — MORPHINE SULFATE 2 MG/ML IJ SOLN
1.0000 mg | INTRAMUSCULAR | Status: AC | PRN
  Filled 2012-08-15: qty 1

## 2012-08-15 MED ORDER — LACTATED RINGERS IV SOLN
INTRAVENOUS | Status: DC | PRN
  Administered 2012-08-15 (×2): via INTRAVENOUS

## 2012-08-15 MED ORDER — PHENYLEPHRINE HCL 10 MG/ML IJ SOLN
30.0000 ug/min | INTRAMUSCULAR | Status: DC
  Filled 2012-08-15: qty 2

## 2012-08-15 MED ORDER — DOPAMINE-DEXTROSE 3.2-5 MG/ML-% IV SOLN
INTRAVENOUS | Status: AC
  Filled 2012-08-15: qty 250

## 2012-08-15 MED ORDER — LACTATED RINGERS IV SOLN
INTRAVENOUS | Status: DC
  Administered 2012-08-15: 20 mL/h via INTRAVENOUS

## 2012-08-15 MED ORDER — LIDOCAINE HCL (CARDIAC) 20 MG/ML IV SOLN
INTRAVENOUS | Status: DC | PRN
  Administered 2012-08-15: 30 mg via INTRAVENOUS

## 2012-08-15 MED ORDER — VECURONIUM BROMIDE 10 MG IV SOLR
INTRAVENOUS | Status: DC | PRN
  Administered 2012-08-15 (×2): 5 mg via INTRAVENOUS
  Administered 2012-08-15: 10 mg via INTRAVENOUS

## 2012-08-15 MED ORDER — DOPAMINE-DEXTROSE 3.2-5 MG/ML-% IV SOLN
3.0000 ug/kg/min | INTRAVENOUS | Status: DC
  Administered 2012-08-15: 3 ug/kg/min via INTRAVENOUS

## 2012-08-15 MED ORDER — MORPHINE SULFATE 2 MG/ML IJ SOLN
2.0000 mg | INTRAMUSCULAR | Status: DC | PRN
  Administered 2012-08-15: 2 mg via INTRAVENOUS
  Administered 2012-08-16: 4 mg via INTRAVENOUS
  Filled 2012-08-15: qty 2

## 2012-08-15 MED ORDER — DEXMEDETOMIDINE HCL IN NACL 200 MCG/50ML IV SOLN
0.1000 ug/kg/h | INTRAVENOUS | Status: DC
  Administered 2012-08-15: 0.7 ug/kg/h via INTRAVENOUS
  Filled 2012-08-15: qty 100

## 2012-08-15 MED ORDER — FAMOTIDINE IN NACL 20-0.9 MG/50ML-% IV SOLN
20.0000 mg | Freq: Two times a day (BID) | INTRAVENOUS | Status: DC
  Administered 2012-08-15: 20 mg via INTRAVENOUS

## 2012-08-15 SURGICAL SUPPLY — 120 items
ADAPTER CARDIO PERF ANTE/RETRO (ADAPTER) ×3 IMPLANT
BAG DECANTER FOR FLEXI CONT (MISCELLANEOUS) ×3 IMPLANT
BENZOIN TINCTURE PRP APPL 2/3 (GAUZE/BANDAGES/DRESSINGS) ×3 IMPLANT
BLADE SURG 11 STRL SS (BLADE) ×3 IMPLANT
CANISTER SUCTION 2500CC (MISCELLANEOUS) ×6 IMPLANT
CANNULA FEM VENOUS REMOTE 22FR (CANNULA) ×3 IMPLANT
CANNULA FEMORAL ART 14 SM (MISCELLANEOUS) ×3 IMPLANT
CANNULA GUNDRY RCSP 15FR (MISCELLANEOUS) ×3 IMPLANT
CANNULA OPTISITE PERFUSION 16F (CANNULA) IMPLANT
CANNULA OPTISITE PERFUSION 18F (CANNULA) ×3 IMPLANT
CARDIAC SUCTION (MISCELLANEOUS) ×3 IMPLANT
CATH KIT ON Q 5IN SLV (PAIN MANAGEMENT) IMPLANT
CLOTH BEACON ORANGE TIMEOUT ST (SAFETY) ×3 IMPLANT
CONN ST 1/4X3/8  BEN (MISCELLANEOUS) ×2
CONN ST 1/4X3/8 BEN (MISCELLANEOUS) ×4 IMPLANT
CONNECTOR 1/2X3/8X1/2 3 WAY (MISCELLANEOUS) ×1
CONNECTOR 1/2X3/8X1/2 3WAY (MISCELLANEOUS) ×2 IMPLANT
CONT SPEC 4OZ CLIKSEAL STRL BL (MISCELLANEOUS) ×3 IMPLANT
CONT SPEC STER OR (MISCELLANEOUS) ×3 IMPLANT
COVER MAYO STAND STRL (DRAPES) ×3 IMPLANT
COVER PROBE W GEL 5X96 (DRAPES) ×3 IMPLANT
COVER SURGICAL LIGHT HANDLE (MISCELLANEOUS) ×6 IMPLANT
CRADLE DONUT ADULT HEAD (MISCELLANEOUS) ×3 IMPLANT
Cor-knot ×3 IMPLANT
DERMABOND ADVANCED (GAUZE/BANDAGES/DRESSINGS) ×1
DERMABOND ADVANCED .7 DNX12 (GAUZE/BANDAGES/DRESSINGS) ×2 IMPLANT
DEVICE PMI PUNCTURE CLOSURE (MISCELLANEOUS) ×3 IMPLANT
DEVICE TROCAR PUNCTURE CLOSURE (ENDOMECHANICALS) ×3 IMPLANT
DRAIN CHANNEL 28F RND 3/8 FF (WOUND CARE) ×6 IMPLANT
DRAPE BILATERAL SPLIT (DRAPES) ×3 IMPLANT
DRAPE C-ARM 42X72 X-RAY (DRAPES) ×3 IMPLANT
DRAPE CV SPLIT W-CLR ANES SCRN (DRAPES) ×3 IMPLANT
DRAPE INCISE IOBAN 66X45 STRL (DRAPES) ×9 IMPLANT
DRAPE SLUSH MACHINE 52X66 (DRAPES) IMPLANT
DRAPE SLUSH/WARMER DISC (DRAPES) ×3 IMPLANT
DRSG COVADERM 4X8 (GAUZE/BANDAGES/DRESSINGS) ×3 IMPLANT
ELECT BLADE 6.5 EXT (BLADE) ×3 IMPLANT
ELECT REM PT RETURN 9FT ADLT (ELECTROSURGICAL) ×6
ELECTRODE REM PT RTRN 9FT ADLT (ELECTROSURGICAL) ×4 IMPLANT
FEMORAL VENOUS CANN RAP (CANNULA) IMPLANT
GLOVE BIO SURGEON STRL SZ 6 (GLOVE) ×21 IMPLANT
GLOVE BIO SURGEON STRL SZ 6.5 (GLOVE) ×9 IMPLANT
GLOVE BIO SURGEON STRL SZ8 (GLOVE) ×3 IMPLANT
GLOVE BIOGEL M STER SZ 6 (GLOVE) ×18 IMPLANT
GLOVE ORTHO TXT STRL SZ7.5 (GLOVE) ×9 IMPLANT
GOWN STRL NON-REIN LRG LVL3 (GOWN DISPOSABLE) ×24 IMPLANT
GRAFT PTCH CORMATRIX 7X10 4PLY (Prosthesis & Implant Heart) ×3 IMPLANT
GUIDEWIRE ANG ZIPWIRE 038X150 (WIRE) IMPLANT
INSERT CONFORM CROSS CLAMP 66M (MISCELLANEOUS) IMPLANT
INSERT CONFORM CROSS CLAMP 86M (MISCELLANEOUS) IMPLANT
IV NS IRRIG 3000ML ARTHROMATIC (IV SOLUTION) ×3 IMPLANT
KIT BASIN OR (CUSTOM PROCEDURE TRAY) ×3 IMPLANT
KIT DILATOR VASC 18G NDL (KITS) ×6 IMPLANT
KIT ROOM TURNOVER OR (KITS) ×3 IMPLANT
KIT SUCTION CATH 14FR (SUCTIONS) ×3 IMPLANT
LEAD PACING MYOCARDI (MISCELLANEOUS) ×3 IMPLANT
NEEDLE AORTIC ROOT 14G 7F (CATHETERS) ×3 IMPLANT
NS IRRIG 1000ML POUR BTL (IV SOLUTION) ×15 IMPLANT
PACK OPEN HEART (CUSTOM PROCEDURE TRAY) ×3 IMPLANT
PAD ARMBOARD 7.5X6 YLW CONV (MISCELLANEOUS) ×6 IMPLANT
PAD ELECT DEFIB RADIOL ZOLL (MISCELLANEOUS) ×3 IMPLANT
RETRACTOR TRL SOFT TISSUE LG (INSTRUMENTS) ×3 IMPLANT
RETRACTOR TRM SOFT TISSUE 7.5 (INSTRUMENTS) IMPLANT
RING MITRAL MEMO 3D 30MM SMD30 (Prosthesis & Implant Heart) ×3 IMPLANT
SET CANNULATION TOURNIQUET (MISCELLANEOUS) ×3 IMPLANT
SET IRRIG TUBING LAPAROSCOPIC (IRRIGATION / IRRIGATOR) ×3 IMPLANT
SOLUTION ANTI FOG 6CC (MISCELLANEOUS) ×3 IMPLANT
SPONGE GAUZE 4X4 12PLY (GAUZE/BANDAGES/DRESSINGS) ×3 IMPLANT
SUCKER WEIGHTED FLEX (MISCELLANEOUS) ×6 IMPLANT
SUT BONE WAX W31G (SUTURE) ×3 IMPLANT
SUT E-PACK MINIMALLY INVASIVE (SUTURE) ×3 IMPLANT
SUT ETHIBOND (SUTURE) ×6 IMPLANT
SUT ETHIBOND 2 0 SH (SUTURE) ×3 IMPLANT
SUT ETHIBOND 2 0 V4 (SUTURE) IMPLANT
SUT ETHIBOND 2 0V4 GREEN (SUTURE) IMPLANT
SUT ETHIBOND 2-0 RB-1 WHT (SUTURE) ×6 IMPLANT
SUT ETHIBOND 4 0 TF (SUTURE) IMPLANT
SUT ETHIBOND 5 0 C 1 30 (SUTURE) IMPLANT
SUT ETHIBOND NAB MH 2-0 36IN (SUTURE) IMPLANT
SUT ETHIBOND X763 2 0 SH 1 (SUTURE) ×3 IMPLANT
SUT GORETEX 6.0 TH-9 30 IN (SUTURE) IMPLANT
SUT GORETEX CV 4 TH 22 36 (SUTURE) ×12 IMPLANT
SUT GORETEX CV-5THC-13 36IN (SUTURE) ×24 IMPLANT
SUT GORETEX CV4 TH-18 (SUTURE) ×12 IMPLANT
SUT GORETEX TH-18 36 INCH (SUTURE) IMPLANT
SUT MNCRL AB 3-0 PS2 18 (SUTURE) IMPLANT
SUT PROLENE 3 0 SH DA (SUTURE) IMPLANT
SUT PROLENE 3 0 SH1 36 (SUTURE) IMPLANT
SUT PROLENE 4 0 RB 1 (SUTURE)
SUT PROLENE 4-0 RB1 .5 CRCL 36 (SUTURE) IMPLANT
SUT PROLENE 5 0 C 1 36 (SUTURE) IMPLANT
SUT PROLENE 6 0 C 1 30 (SUTURE) ×3 IMPLANT
SUT SILK  1 MH (SUTURE)
SUT SILK 1 MH (SUTURE) IMPLANT
SUT SILK 1 TIES 10X30 (SUTURE) IMPLANT
SUT SILK 2 0 SH CR/8 (SUTURE) IMPLANT
SUT SILK 2 0 TIES 10X30 (SUTURE) IMPLANT
SUT SILK 2 0SH CR/8 30 (SUTURE) IMPLANT
SUT SILK 3 0 (SUTURE)
SUT SILK 3 0 SH CR/8 (SUTURE) IMPLANT
SUT SILK 3 0SH CR/8 30 (SUTURE) IMPLANT
SUT SILK 3-0 18XBRD TIE 12 (SUTURE) IMPLANT
SUT TEM PAC WIRE 2 0 SH (SUTURE) IMPLANT
SUT VIC AB 2-0 CTX 36 (SUTURE) IMPLANT
SUT VIC AB 2-0 UR6 27 (SUTURE) IMPLANT
SUT VIC AB 3-0 SH 8-18 (SUTURE) IMPLANT
SUT VICRYL 2 TP 1 (SUTURE) IMPLANT
SYRINGE 10CC LL (SYRINGE) ×3 IMPLANT
SYSTEM SAHARA CHEST DRAIN ATS (WOUND CARE) ×3 IMPLANT
TAPE CLOTH SURG 4X10 WHT LF (GAUZE/BANDAGES/DRESSINGS) ×3 IMPLANT
TOWEL OR 17X24 6PK STRL BLUE (TOWEL DISPOSABLE) ×6 IMPLANT
TOWEL OR 17X26 10 PK STRL BLUE (TOWEL DISPOSABLE) ×3 IMPLANT
TRAY FOLEY IC TEMP SENS 14FR (CATHETERS) ×3 IMPLANT
TROCAR XCEL BLADELESS 5X75MML (TROCAR) ×3 IMPLANT
TROCAR XCEL NON-BLD 11X100MML (ENDOMECHANICALS) ×6 IMPLANT
TUBE SUCT INTRACARD DLP 20F (MISCELLANEOUS) ×3 IMPLANT
TUNNELER SHEATH ON-Q 11GX8 (MISCELLANEOUS) IMPLANT
UNDERPAD 30X30 INCONTINENT (UNDERPADS AND DIAPERS) ×3 IMPLANT
WATER STERILE IRR 1000ML POUR (IV SOLUTION) ×6 IMPLANT
WIRE BENTSON .035X145CM (WIRE) ×3 IMPLANT

## 2012-08-15 NOTE — Progress Notes (Signed)
MD reduced fio2 to 50%.

## 2012-08-15 NOTE — Interval H&P Note (Signed)
History and Physical Interval Note:  08/15/2012 7:28 AM  [name]  has presented today for surgery, with the diagnosis of MR  The various methods of treatment have been discussed with the patient and family. After consideration of risks, benefits and other options for treatment, the patient has consented to  Procedure(s): MINIMALLY INVASIVE MITRAL VALVE REPAIR (MVR) (Right) INTRAOPERATIVE TRANSESOPHAGEAL ECHOCARDIOGRAM (N/A) as a surgical intervention .  The patient's history has been reviewed, patient examined, no change in status, stable for surgery.  I have reviewed the patient's chart and labs.  Questions were answered to the patient's satisfaction.     OWEN,CLARENCE H

## 2012-08-15 NOTE — Anesthesia Preprocedure Evaluation (Addendum)
Anesthesia Evaluation  Patient identified by MRN, date of birth, ID band Patient awake    Reviewed: Allergy & Precautions, H&P , NPO status , Patient's Chart, lab work & pertinent test results  History of Anesthesia Complications Negative for: history of anesthetic complications  Airway Mallampati: II TM Distance: >3 FB Neck ROM: Full    Dental no notable dental hx. (+) Teeth Intact and Dental Advisory Given   Pulmonary Sleep apnea: denies. , former smoker, PE (2011; Lovenox bridge) breath sounds clear to auscultation  Pulmonary exam normal       Cardiovascular Exercise Tolerance: Poor hypertension, Pt. on medications + CAD (nonobstructive), + Peripheral Vascular Disease and + DOE + Valvular Problems/Murmurs MR Rhythm:Regular Rate:Normal + Systolic murmurs pulm HTN Cardiomyopathy   TEE 06/2012 Study Conclusions  - Left ventricle: The cavity size was mildly dilated. Wall   thickness was normal. Systolic function was normal. The   estimated ejection fraction was in the range of 55% to   60%. - Aortic valve: There was no stenosis. - Mitral valve: Severe MR with flail P2 segment. PISA ERO   0.53 cm^2. Pulmonary vein doppler interrogation shows   systolic blunting but not reversal. - Left atrium: The atrium was moderately dilated. No   evidence of thrombus in the atrial cavity or appendage. - Right ventricle: The cavity size was normal. Systolic   function was normal. - Right atrium: No evidence of thrombus in the atrial cavity   or appendage. - Atrial septum: No defect or patent foramen ovale was   identified. Echo contrast study showed no right-to-left   atrial level shunt, at baseline or with provocation.    Neuro/Psych negative neurological ROS  negative psych ROS   GI/Hepatic negative GI ROS, Neg liver ROS, Pt noted to by hoarse on exam. Pt reports "that is normal".   Endo/Other  diabetes (preDM; diet controlled),  Well Controlled, Type 2  Renal/GU negative Renal ROS  negative genitourinary   Musculoskeletal  (+) Arthritis -, Osteoarthritis,    Abdominal (+) + obese,   Peds  Hematology  (+) Blood dyscrasia (Lovenox), ,   Anesthesia Other Findings   Reproductive/Obstetrics negative OB ROS                      Anesthesia Physical Anesthesia Plan  ASA: IV  Anesthesia Plan: General   Post-op Pain Management:    Induction: Intravenous  Airway Management Planned: Double Lumen EBT  Additional Equipment: Arterial line, CVP, PA Cath, TEE, 3D TEE and Ultrasound Guidance Line Placement  Intra-op Plan:   Post-operative Plan: Post-operative intubation/ventilation  Informed Consent: I have reviewed the patients History and Physical, chart, labs and discussed the procedure including the risks, benefits and alternatives for the proposed anesthesia with the patient or authorized representative who has indicated his/her understanding and acceptance.   Dental advisory given  Plan Discussed with: CRNA  Anesthesia Plan Comments:         Anesthesia Quick Evaluation

## 2012-08-15 NOTE — Preoperative (Signed)
Beta Blockers   Reason not to administer Beta Blockers:Not Applicable 

## 2012-08-15 NOTE — Anesthesia Postprocedure Evaluation (Signed)
  Anesthesia Post-op Note  Patient: [name]  Procedure(s) Performed: Procedure(s): MINIMALLY INVASIVE MITRAL VALVE REPAIR (MVR) (Right) INTRAOPERATIVE TRANSESOPHAGEAL ECHOCARDIOGRAM (N/A)  Patient Location: ICU  Anesthesia Type:General  Level of Consciousness: sedated and unresponsive  Airway and Oxygen Therapy: Patient remains intubated per anesthesia plan  Post-op Pain: none  Post-op Assessment: Post-op Vital signs reviewed, Patient's Cardiovascular Status Stable and Respiratory Function Stable  Post-op Vital Signs: Reviewed and stable  Complications: No apparent anesthesia complications

## 2012-08-15 NOTE — Progress Notes (Signed)
  Echocardiogram Echocardiogram Transesophageal has been performed.  [name] 08/15/2012, 9:19 AM

## 2012-08-15 NOTE — Progress Notes (Signed)
Utilization review completed. Tiyona Desouza, RN, BSN. 

## 2012-08-15 NOTE — Brief Op Note (Signed)
08/15/2012  2:27 PM  PATIENT:  [name]  67 y.o. 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

## 2012-08-15 NOTE — Anesthesia Procedure Notes (Signed)
Procedure Name: Intubation Date/Time: 08/15/2012 8:09 AM Performed by: Leona Singleton A Pre-anesthesia Checklist: Patient identified Patient Re-evaluated:Patient Re-evaluated prior to inductionOxygen Delivery Method: Circle system utilized Preoxygenation: Pre-oxygenation with 100% oxygen Intubation Type: IV induction Ventilation: Mask ventilation without difficulty and Oral airway inserted - appropriate to patient size Laryngoscope Size: Hyacinth Meeker and 3 Grade View: Grade III Endobronchial tube: Left, Double lumen EBT, EBT position confirmed by auscultation and EBT position confirmed by fiberoptic bronchoscope and 41 Fr Number of attempts: 4 Airway Equipment and Method: Stylet,  Video-laryngoscopy and Bougie stylet Placement Confirmation: ETT inserted through vocal cords under direct vision,  positive ETCO2 and breath sounds checked- equal and bilateral Tube secured with: Tape Dental Injury: Bloody posterior oropharynx and Teeth and Oropharynx as per pre-operative assessment  Difficulty Due To: Difficulty was unanticipated and Difficult Airway- due to anterior larynx Future Recommendations: Recommend- induction with short-acting agent, and alternative techniques readily available Comments: Pt with small glottic opening. Resistance subglottic to DLT passing. Successful intubation by Dr Sampson Goon with glidescope and bougie utilized.

## 2012-08-15 NOTE — Progress Notes (Signed)
FIO2 reduced per ABG

## 2012-08-15 NOTE — Transfer of Care (Signed)
Immediate Anesthesia Transfer of Care Note  Patient: [name]  Procedure(s) Performed: Procedure(s): MINIMALLY INVASIVE MITRAL VALVE REPAIR (MVR) (Right) INTRAOPERATIVE TRANSESOPHAGEAL ECHOCARDIOGRAM (N/A)  Patient Location: SICU   Anesthesia Type:General  Level of Consciousness: sedated, comatose and Patient remains intubated per anesthesia plan  Airway & Oxygen Therapy: Patient remains intubated per anesthesia plan and Patient placed on Ventilator (see vital sign flow sheet for setting)  Post-op Assessment: Report given to PACU RN and Post -op Vital signs reviewed and stable  Post vital signs: Reviewed and stable  Complications: No apparent anesthesia complications

## 2012-08-15 NOTE — OR Nursing (Signed)
13:45PM -call to vol desk to inform off pump, 1st call to SICU.

## 2012-08-15 NOTE — Op Note (Signed)
CARDIOTHORACIC SURGERY OPERATIVE NOTE  Date of Procedure:  08/15/2012  Preoperative Diagnosis: Severe Mitral Regurgitation  Postoperative Diagnosis: Same  Procedure:    Minimally-Invasive Mitral Valve Repair  Complex valvuloplasty including triangular resection of posterior leaflet  Gore-tex neocord placement x4  Sorin Memo 3D Ring Annuloplasty (size 30mm, catalog # [id number], serial # [id number])    Surgeon: Salvatore Decent. 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 The patient states that he is been told that he has had a heart murmur for most of his life. He underwent hip replacement in 2009 and developed shortness of breath in March of 2010. He was found to have bilateral pulmonary emboli. At that time he was noted to have mild to moderate mitral regurgitation on routine echocardiogram. He completed a six-month course of Coumadin but he developed recurrent pulmonary emboli in August 2012. He was restarted on Coumadin at that time. Echocardiogram at that time demonstrated moderate mitral regurgitation. He recently was seen in followup by Dr. Clifton James and repeat echocardiogram performed 06/20/2012 demonstrated severe mitral regurgitation with what appeared to be a flail posterior leaflet. This was confirmed by transesophageal  echocardiogram performed 07/03/2012 by Dr. Shirlee Latch. Left and right heart catheterization was performed demonstrating mild, nonobstructive coronary artery disease. There was mild pulmonary retention. There was no significant atherosclerotic disease involving the descending thoracic or abdominal aorta or iliac vessels. The patient was referred for elective surgical consultation.  The patient has been seen in consultation and counseled at length regarding the indications, risks and potential benefits of surgery.  All questions have been answered, and the patient provides full informed consent for the operation as described.    DETAILS OF THE OPERATIVE PROCEDURE  The patient is brought to the operating room on the above mentioned date and central monitoring was established by the anesthesia team including placement of Swan-Ganz catheter through the left internal jugular vein.  A radial arterial line is placed. The patient is placed in the supine position on the operating table.  Intravenous antibiotics are administered. General endotracheal anesthesia is induced uneventfully. The patient is initially intubated using a dual lumen endotracheal tube.  A Foley catheter is placed.  Baseline transesophageal echocardiogram was performed.  Findings were notable for fibroelastic deficiency type degenerative disease with an obvious flail segment of the posterior leaflet of the mitral valve. There is severe mitral regurgitation. The aortic valve is normal. The left ventricle is dilated. There is mild left ventricular dysfunction.  A soft roll is placed behind the patient's left scapula and the neck gently extended and turned to the left.   The patient's right neck, chest, abdomen, both groins, and both lower extremities are prepared and draped in a sterile manner. A time out procedure is performed.  A small incision is made in the right inguinal crease and the anterior surface of the right common femoral artery and  right common femoral vein are identified.  A right miniature anterolateral thoracotomy incision is performed. The incision is placed just lateral to and superior to the right nipple. The pectoralis major muscle is retracted medially and completely preserved. The right pleural space is entered through the 3rd intercostal space. A soft tissue retractor is placed.  Two 11 mm ports are placed through separate stab incisions inferiorly. The right pleural space is insufflated continuously with carbon dioxide gas through the posterior port during the remainder of the operation.  A pledgeted sutures placed through the dome of the right hemidiaphragm and retracted inferiorly to facilitate exposure.  A longitudinal incision is made in the pericardium 3 cm anterior to the phrenic nerve and silk traction sutures are placed on either side of the incision for exposure.  The patient is placed in Trendelenburg position. The right internal jugular vein is cannulated with Seldinger technique and a guidewire advanced into the right atrium. The patient is heparinized systemically. The right internal jugular vein is cannulated with a 14 [place] pediatric femoral venous cannula. Pursestring sutures are placed on the anterior surface of the right common femoral vein and right common femoral artery. The right common femoral vein is cannulated with the Seldinger technique and a guidewire is advanced under transesophageal echocardiogram guidance through the right atrium. The femoral vein is cannulated with a long 22 French femoral venous cannula. The right common femoral artery is cannulated with Seldinger technique and a flexible guidewire is advanced until it can be appreciated intraluminally in the descending thoracic aorta on transesophageal echocardiogram. The femoral artery is cannulated with an 18 French femoral arterial cannula.  Adequate heparinization is verified.     The entire pre-bypass portion of the operation was notable  for stable hemodynamics.  Cardiopulmonary bypass was begun.  Vacuum assist venous drainage is utilized. The incision in the pericardium is extended in both directions. Venous drainage and exposure are notably excellent. A retrograde cardioplegia cannula is placed through the right atrium into the coronary sinus using transesophageal echocardiogram guidance.  An antegrade cardioplegia cannula is placed in the ascending aorta.    The patient is cooled to 28C systemic temperature.  The aortic cross clamp is applied and cold blood cardioplegia is delivered initially in an antegrade fashion through the aortic root.   Supplemental cardioplegia is given retrograde through the coronary sinus catheter. The initial cardioplegic arrest is rapid with early diastolic arrest.  Repeat doses of cardioplegia are administered intermittently every 20 to 30 minutes throughout the entire cross clamp portion of the operation through the aortic root and through the coronary sinus catheter in order to maintain completely flat electrocardiogram.  Myocardial protection was felt to be excellent.  A left atriotomy incision was performed through the interatrial groove and extended partially across the back wall of the left atrium after opening the oblique sinus inferiorly.  The mitral valve is exposed using a self-retaining retractor.  The mitral valve was inspected and notable for fibroelastic deficiency type degenerative disease with a large flail segment of the middle scallop (P2) of the posterior leaflet. All of the primary cords to the portion of P2 with cords originating from the anterior papillary muscle were torn and the majority of the cords from the posterior papillary muscle were torn as well. The anterior leaflet is normal. The P1 and P3 portions of posterior leaflet were normal.  Interrupted 2-0 Ethibond horizontal mattress sutures are placed circumferentially around the entire mitral valve annulus. The sutures will  ultimately be  utilized for ring annuloplasty, and at this juncture there are utilized to suspend the valve symmetrically.  The flail portion of P2 was repaired using a triangular resection. Approximately 30% of the total surface area of P2 was resected. Do to the large number of primary cords that were torn, 2 pairs of artificial Gore-Tex medial cords were placed into the head of the anterior papillary muscle. Initially a pledgeted CV 4 Gore-Tex suture was placed in the head of the anterior papillary muscle and the suture tied. A second CV 4 Gore-Tex suture was placed through the head incorporating the pledgets from the first pair of sutures. The intervening vertical defect in the posterior leaflet was closed using everting simple CV 5 Gore-Tex suture.  The valve was tested with saline and appeared competent even without ring annuloplasty complete. The valve was sized to a 30 mm annuloplasty ring, based upon the transverse distance between the left and right commissures and the height of the anterior leaflet, corresponding to a size just slightly larger than the overall surface area of the anterior leaflet.  A Sorin Memo 3D annuloplasty ring (size 30mm, catalog #[id number], serial [id number]) was secured in place uneventfully. The valve was tested with saline and appeared competent.   The individual limbs of the Goretex neocords were retrieved from the LV chamber, woven into the posterior leaflet beginning at the free margin where they were placed from the ventricular surface to the atrial surface, and then woven in a diamond shaped fashion towards the posterior mitral annulus.  The Goretex sutures were then tied while the LV was distended with saline so as to adjust the length of the neocords to the appropriate length.  The valve is again tested with saline and appears to be perfectly competent with a broad symmetrical line of coaptation of the anterior and posterior leaflet. There is no residual leak. There was a  broad, symmetrical line of coaptation of the anterior and posterior leaflet which was confirmed using the blue ink test.  Rewarming is begun.  The atriotomy was closed using a 2-layer closure of running 3-0 Prolene suture after placing a sump drain across the mitral valve to serve as a left ventricular vent.  One final dose of warm retrograde "hot shot" cardioplegia was administered retrograde through the coronary sinus catheter while all air was evacuated through the aortic root.  The aortic cross clamp was removed after a total cross clamp time of 134 minutes.  Epicardial pacing wires are fixed to the inferior wall of the right ventricule and to the right atrial appendage. The patient is rewarmed to 37C temperature. The left ventricular vent is removed.  The patient is ventilated and flow volumes turndown while the mitral valve repair is inspected using transesophageal echocardiogram. The valve repair appears intact with no residual leak. The antegrade cardioplegia cannula is now removed. The patient is weaned and disconnected from cardiopulmonary bypass.  The patient's rhythm at separation from bypass was AV paced.  The patient was weaned from bypass without any inotropic support. Total cardiopulmonary bypass time for the operation was 173 minutes.  Followup transesophageal echocardiogram performed after separation from bypass revealed a well-seated annuloplasty ring in the mitral position with a normal functioning mitral valve. There was no residual leak.  Left ventricular function was unchanged from preoperatively.    The femoral arterial and venous cannulae were removed uneventfully. There was a palpable pulse in the distal right common femoral artery after removal of the cannula. Protamine was administered to reverse  the anticoagulation. The right internal jugular cannula was removed and manual pressure held on the neck for 15 minutes.  Single lung ventilation was begun. The atriotomy closure was  inspected for hemostasis. The pericardial sac was drained using a 28 French Bard drain placed through the anterior port incision.  The pericardium was closed using a patch of core matrix bovine submucosal tissue patch. The right pleural space is irrigated with saline solution and inspected for hemostasis. The right pleural space was drained using a 28 French Bard drain placed through the posterior port incision. The miniature thoracotomy incision was closed in multiple layers in routine fashion. The right groin incision was inspected for hemostasis and closed in multiple layers in routine fashion.  The patient tolerated the procedure well.  The patient was reintubated using a single lumen endotracheal tube and subsequently transported to the surgical intensive care unit in stable condition. There were no intraoperative complications. All sponge instrument and needle counts are verified correct at completion of the operation.  The post-bypass portion of the operation was notable for stable rhythm and hemodynamics.  No blood products were administered during the operation.   Salvatore Decent. Cornelius Moras MD 08/15/2012 2:32 PM

## 2012-08-15 NOTE — Progress Notes (Signed)
TCTS pm rounds  Postop MV repair PO2 improved on vent A-paced, CI 1.9 Excellent u/o  Lab Results  Component Value Date   CREATININE 1.49* 08/10/2012   BUN 20 08/10/2012   NA 140 08/15/2012   K 4.0 08/15/2012   CL 103 08/10/2012   CO2 18* 08/10/2012

## 2012-08-15 NOTE — Progress Notes (Signed)
FIO increased to 100% due to ABG PO2 49. Decreased VT to 720 due to peak pressures.cred .cred

## 2012-08-16 ENCOUNTER — Inpatient Hospital Stay (HOSPITAL_COMMUNITY): Payer: 59

## 2012-08-16 DIAGNOSIS — J9819 Other pulmonary collapse: Secondary | ICD-10-CM | POA: Diagnosis not present

## 2012-08-16 DIAGNOSIS — Z452 Encounter for adjustment and management of vascular access device: Secondary | ICD-10-CM | POA: Diagnosis not present

## 2012-08-16 LAB — BASIC METABOLIC PANEL
BUN: 15 mg/dL (ref 6–23)
CO2: 26 mEq/L (ref 19–32)
Calcium: 8.4 mg/dL (ref 8.4–10.5)
Creatinine, Ser: 1.4 mg/dL — ABNORMAL HIGH (ref 0.50–1.35)

## 2012-08-16 LAB — GLUCOSE, CAPILLARY
Glucose-Capillary: 109 mg/dL — ABNORMAL HIGH (ref 70–99)
Glucose-Capillary: 121 mg/dL — ABNORMAL HIGH (ref 70–99)
Glucose-Capillary: 115 mg/dL — ABNORMAL HIGH (ref 70–99)
Glucose-Capillary: 103 mg/dL — ABNORMAL HIGH (ref 70–99)
Glucose-Capillary: 111 mg/dL — ABNORMAL HIGH (ref 70–99)
Glucose-Capillary: 115 mg/dL — ABNORMAL HIGH (ref 70–99)
Glucose-Capillary: 117 mg/dL — ABNORMAL HIGH (ref 70–99)
Glucose-Capillary: 120 mg/dL — ABNORMAL HIGH (ref 70–99)
Glucose-Capillary: 136 mg/dL — ABNORMAL HIGH (ref 70–99)

## 2012-08-16 LAB — POCT I-STAT 3, ART BLOOD GAS (G3+)
Acid-base deficit: 1 mmol/L (ref 0.0–2.0)
pCO2 arterial: 41.1 mmHg (ref 35.0–45.0)
pCO2 arterial: 46.8 mmHg — ABNORMAL HIGH (ref 35.0–45.0)
pH, Arterial: 7.34 — ABNORMAL LOW (ref 7.350–7.450)
pH, Arterial: 7.369 (ref 7.350–7.450)
pO2, Arterial: 105 mmHg — ABNORMAL HIGH (ref 80.0–100.0)
pO2, Arterial: 93 mmHg (ref 80.0–100.0)

## 2012-08-16 LAB — CBC
MCH: 30.7 pg (ref 26.0–34.0)
MCV: 87.1 fL (ref 78.0–100.0)
MCV: 88.4 fL (ref 78.0–100.0)
Platelets: 128 10*3/uL — ABNORMAL LOW (ref 150–400)
RBC: 3.71 MIL/uL — ABNORMAL LOW (ref 4.22–5.81)
RBC: 3.78 MIL/uL — ABNORMAL LOW (ref 4.22–5.81)
RDW: 13.5 % (ref 11.5–15.5)
WBC: 10.8 10*3/uL — ABNORMAL HIGH (ref 4.0–10.5)

## 2012-08-16 LAB — MAGNESIUM
Magnesium: 2.4 mg/dL (ref 1.5–2.5)
Magnesium: 2.2 mg/dL (ref 1.5–2.5)

## 2012-08-16 MED ORDER — MORPHINE SULFATE 2 MG/ML IJ SOLN: 2.0000 mg | INTRAMUSCULAR | Status: DC | PRN

## 2012-08-16 MED ORDER — INSULIN ASPART 100 UNIT/ML ~~LOC~~ SOLN
0.0000 [IU] | SUBCUTANEOUS | Status: DC
  Administered 2012-08-16: 2 [IU] via SUBCUTANEOUS

## 2012-08-16 MED ORDER — WARFARIN SODIUM 5 MG PO TABS: 5.0000 mg | ORAL_TABLET | Freq: Every day | ORAL | Status: DC

## 2012-08-16 MED ORDER — INSULIN DETEMIR 100 UNIT/ML ~~LOC~~ SOLN
20.0000 [IU] | Freq: Once | SUBCUTANEOUS | Status: AC
  Administered 2012-08-16: 20 [IU] via SUBCUTANEOUS
  Filled 2012-08-16: qty 0.2

## 2012-08-16 MED ORDER — AMIODARONE HCL 200 MG PO TABS
200.0000 mg | ORAL_TABLET | Freq: Two times a day (BID) | ORAL | Status: DC
  Administered 2012-08-17 – 2012-08-19 (×5): 200 mg via ORAL
  Filled 2012-08-16 (×6): qty 1

## 2012-08-16 MED ORDER — AMIODARONE HCL 200 MG PO TABS: 200.0000 mg | ORAL_TABLET | Freq: Two times a day (BID) | ORAL | Status: DC

## 2012-08-16 MED ORDER — WARFARIN SODIUM 5 MG PO TABS
5.0000 mg | ORAL_TABLET | Freq: Every day | ORAL | Status: DC
  Administered 2012-08-16 – 2012-08-17 (×2): 5 mg via ORAL
  Filled 2012-08-16 (×4): qty 1

## 2012-08-16 MED ORDER — FUROSEMIDE 10 MG/ML IJ SOLN
20.0000 mg | Freq: Four times a day (QID) | INTRAMUSCULAR | Status: AC
  Administered 2012-08-16 (×3): 20 mg via INTRAVENOUS
  Filled 2012-08-16 (×3): qty 2

## 2012-08-16 MED ORDER — WARFARIN - PHYSICIAN DOSING INPATIENT
Freq: Every day | Status: DC
  Administered 2012-08-16: 18:00:00

## 2012-08-16 MED ORDER — ENOXAPARIN SODIUM 30 MG/0.3ML ~~LOC~~ SOLN
30.0000 mg | Freq: Every day | SUBCUTANEOUS | Status: DC
  Administered 2012-08-17 – 2012-08-18 (×2): 30 mg via SUBCUTANEOUS
  Filled 2012-08-16 (×3): qty 0.3

## 2012-08-16 MED FILL — Heparin Sodium (Porcine) Inj 1000 Unit/ML: INTRAMUSCULAR | Qty: 30 | Status: AC

## 2012-08-16 MED FILL — Lidocaine HCl IV Inj 20 MG/ML: INTRAVENOUS | Qty: 5 | Status: AC

## 2012-08-16 MED FILL — Potassium Chloride Inj 2 mEq/ML: INTRAVENOUS | Qty: 40 | Status: AC

## 2012-08-16 MED FILL — Heparin Sodium (Porcine) Inj 1000 Unit/ML: INTRAMUSCULAR | Qty: 10 | Status: AC

## 2012-08-16 MED FILL — Sodium Chloride IV Soln 0.9%: INTRAVENOUS | Qty: 1000 | Status: AC

## 2012-08-16 MED FILL — Electrolyte-R (PH 7.4) Solution: INTRAVENOUS | Qty: 4000 | Status: AC

## 2012-08-16 MED FILL — Magnesium Sulfate Inj 50%: INTRAMUSCULAR | Qty: 10 | Status: AC

## 2012-08-16 MED FILL — Mannitol IV Soln 20%: INTRAVENOUS | Qty: 500 | Status: AC

## 2012-08-16 MED FILL — Sodium Chloride Irrigation Soln 0.9%: Qty: 3000 | Status: AC

## 2012-08-16 MED FILL — Sodium Bicarbonate IV Soln 8.4%: INTRAVENOUS | Qty: 50 | Status: AC

## 2012-08-16 NOTE — Progress Notes (Addendum)
   CARDIOTHORACIC SURGERY PROGRESS NOTE   R1 Day Post-Op Procedure(s) (LRB): MINIMALLY INVASIVE MITRAL VALVE REPAIR (MVR) (Right) INTRAOPERATIVE TRANSESOPHAGEAL ECHOCARDIOGRAM (N/A)  Subjective: Looks good.  Mild soreness in chest.  Otherwise feels well.  Objective: Vital signs: BP Readings from Last 1 Encounters:  08/16/12 111/62   Pulse Readings from Last 1 Encounters:  08/16/12 80   Resp Readings from Last 1 Encounters:  08/16/12 17   Temp Readings from Last 1 Encounters:  08/16/12 99.5 F (37.5 C)     Hemodynamics: PAP: (25-52)/(13-27) 47/20 mmHg CO:  [3.6 L/min-6.8 L/min] 6.8 L/min CI:  [1.6 L/min/m2-3.1 L/min/m2] 3.1 L/min/m2  Physical Exam:  Rhythm:   Sinus brady - AAI paced  Breath sounds: clear  Heart sounds:  RRR w/out murmur  Incisions:  Dressings dry, intact  Abdomen:  Soft, non-distended, non-tender  Extremities:  Warm, well-perfused    Intake/Output from previous day: 05/27 0701 - 05/28 0700 In: 7257.5 [I.V.:4997.5; Blood:650; NG/GT:60; IV Piggyback:1550] Out: 6330 [Urine:4930; Emesis/NG output:200; Blood:800; Chest Tube:400] Intake/Output this shift:    Lab Results:  Recent Labs  08/15/12 2107 08/15/12 2113 08/16/12 0310  WBC 7.1  --  8.3  HGB 11.4* 10.5* 11.4*  HCT 32.5* 31.0* 32.3*  PLT 124*  --  128*   BMET:  Recent Labs  08/15/12 2113 08/16/12 0310  NA 142 140  K 3.8 4.2  CL 109 106  CO2  --  26  GLUCOSE 119* 117*  BUN 17 15  CREATININE 1.50* 1.40*  CALCIUM  --  8.4    CBG (last 3)   Recent Labs  08/16/12 0201 08/16/12 0310 08/16/12 0519  GLUCAP 109* 103* 115*   ABG    Component Value Date/Time   PHART 7.340* 08/16/2012 0203   HCO3 24.9* 08/16/2012 0203   TCO2 26 08/16/2012 0203   ACIDBASEDEF 1.0 08/16/2012 0203   O2SAT 97.0 08/16/2012 0203   CXR: *RADIOLOGY REPORT*  Clinical Data: Status post chest tube placement  PORTABLE CHEST - 1 VIEW  Comparison: 08/15/2012  Findings: The endotracheal tube has been  removed. The left IJ  pulmonary arterial catheter is identified with tip in the right  main pulmonary artery. Right-sided chest tube is noted with tip in  the medial aspect of the right apex. Heart size appears moderately  enlarged. There is atelectasis identified in both lung bases which  is unchanged from previous exam.  IMPRESSION:  1. Interval removal of endotracheal tube.  2. Persistent low lung volumes with bibasilar atelectasis  Original Report Authenticated By: Signa Kell, M.D.    Assessment/Plan: S/P Procedure(s) (LRB): MINIMALLY INVASIVE MITRAL VALVE REPAIR (MVR) (Right) INTRAOPERATIVE TRANSESOPHAGEAL ECHOCARDIOGRAM (N/A)  Doing very well POD1 Sinus brady w/ stable hemodynamics AAI pacing w/ no drips Expected post op acute blood loss anemia, mild, stable Expected post op volume excess, mild, diuresing Expected post op atelectasis, mild Pre-op chronic kidney disease, creatinine stable at baseline Long-standing HTN Recurrent PE   Continue AAI pacing for now  Mobilize  Diuresis  pulm toilet  Restart lovenox, coumadin  [name] 08/16/2012 8:10 AM

## 2012-08-16 NOTE — Progress Notes (Signed)
1800 Left IJ sleeve Dc'd per protocol due to air in tubing after attempt to draw evening labs. Pt tolerated procedure well.

## 2012-08-16 NOTE — Procedures (Signed)
Extubation Procedure Note  Patient Details:   Name: [name] DOB: September 26, 1945 MRN: [id number]   Airway Documentation:  AIRWAYS 8 mm (Active)  Secured at (cm) 23 cm 08/15/2012 12:00 AM    Evaluation  O2 sats: stable throughout Complications: No apparent complications Patient did tolerate procedure well. Bilateral Breath Sounds: Clear Suctioning: Airway Yes   Cambelle Suchecki 08/16/2012, 1:03 AM

## 2012-08-16 NOTE — Progress Notes (Signed)
POD # 1 MV repair  Resting comfortably  BP 108/56  Pulse 80  Temp(Src) 99.4 F (37.4 C) (Oral)  Resp 18  Ht 6' (1.829 m)  Wt 227 lb 4.7 oz (103.1 kg)  BMI 30.82 kg/m2  SpO2 94%   Intake/Output Summary (Last 24 hours) at 08/16/12 1653 Last data filed at 08/16/12 1500  Gross per 24 hour  Intake 2043.14 ml  Output   5495 ml  Net -3451.86 ml   Cbg well controlled

## 2012-08-17 ENCOUNTER — Inpatient Hospital Stay (HOSPITAL_COMMUNITY): Payer: 59

## 2012-08-17 ENCOUNTER — Encounter (HOSPITAL_COMMUNITY): Payer: Self-pay | Admitting: *Deleted

## 2012-08-17 LAB — CBC
HCT: 32.2 % — ABNORMAL LOW (ref 39.0–52.0)
Hemoglobin: 11.1 g/dL — ABNORMAL LOW (ref 13.0–17.0)
MCHC: 34.5 g/dL (ref 30.0–36.0)
MCV: 88.2 fL (ref 78.0–100.0)
RDW: 14 % (ref 11.5–15.5)
WBC: 9.8 10*3/uL (ref 4.0–10.5)

## 2012-08-17 LAB — BASIC METABOLIC PANEL
BUN: 18 mg/dL (ref 6–23)
CO2: 29 mEq/L (ref 19–32)
Chloride: 101 mEq/L (ref 96–112)
Creatinine, Ser: 1.37 mg/dL — ABNORMAL HIGH (ref 0.50–1.35)

## 2012-08-17 LAB — GLUCOSE, CAPILLARY: Glucose-Capillary: 109 mg/dL — ABNORMAL HIGH (ref 70–99)

## 2012-08-17 MED ORDER — POTASSIUM CHLORIDE CRYS ER 20 MEQ PO TBCR
20.0000 meq | EXTENDED_RELEASE_TABLET | Freq: Two times a day (BID) | ORAL | Status: DC
  Administered 2012-08-18 – 2012-08-19 (×3): 20 meq via ORAL
  Filled 2012-08-17 (×4): qty 1

## 2012-08-17 MED ORDER — BENAZEPRIL HCL 10 MG PO TABS
10.0000 mg | ORAL_TABLET | Freq: Every evening | ORAL | Status: DC
  Administered 2012-08-17 – 2012-08-18 (×2): 10 mg via ORAL
  Filled 2012-08-17 (×3): qty 1

## 2012-08-17 MED ORDER — SODIUM CHLORIDE 0.9 % IV SOLN: 250.0000 mL | INTRAVENOUS | Status: DC | PRN

## 2012-08-17 MED ORDER — FENOFIBRATE 54 MG PO TABS
54.0000 mg | ORAL_TABLET | Freq: Every day | ORAL | Status: DC
  Administered 2012-08-17 – 2012-08-19 (×3): 54 mg via ORAL
  Filled 2012-08-17 (×3): qty 1

## 2012-08-17 MED ORDER — SODIUM CHLORIDE 0.9 % IV SOLN
INTRAVENOUS | Status: DC
  Administered 2012-08-17: 1000 mL via INTRAVENOUS

## 2012-08-17 MED ORDER — ASPIRIN EC 81 MG PO TBEC
81.0000 mg | DELAYED_RELEASE_TABLET | Freq: Every day | ORAL | Status: DC
  Administered 2012-08-17 – 2012-08-19 (×3): 81 mg via ORAL
  Filled 2012-08-17 (×3): qty 1

## 2012-08-17 MED ORDER — SODIUM CHLORIDE 0.9 % IJ SOLN
3.0000 mL | Freq: Two times a day (BID) | INTRAMUSCULAR | Status: DC
  Administered 2012-08-17 – 2012-08-19 (×4): 3 mL via INTRAVENOUS

## 2012-08-17 MED ORDER — SODIUM CHLORIDE 0.9 % IJ SOLN: 3.0000 mL | INTRAMUSCULAR | Status: DC | PRN

## 2012-08-17 MED ORDER — POTASSIUM CHLORIDE CRYS ER 20 MEQ PO TBCR
40.0000 meq | EXTENDED_RELEASE_TABLET | Freq: Three times a day (TID) | ORAL | Status: AC
  Administered 2012-08-17 (×3): 40 meq via ORAL
  Filled 2012-08-17 (×3): qty 2

## 2012-08-17 MED ORDER — FUROSEMIDE 40 MG PO TABS
40.0000 mg | ORAL_TABLET | Freq: Two times a day (BID) | ORAL | Status: DC
  Administered 2012-08-17 – 2012-08-18 (×4): 40 mg via ORAL
  Filled 2012-08-17 (×8): qty 1

## 2012-08-17 MED ORDER — TRAMADOL HCL 50 MG PO TABS: 50.0000 mg | ORAL_TABLET | ORAL | Status: DC | PRN

## 2012-08-17 MED ORDER — MOVING RIGHT ALONG BOOK
Freq: Once | Status: AC
  Administered 2012-08-17: 09:00:00
  Filled 2012-08-17: qty 1

## 2012-08-17 NOTE — Plan of Care (Signed)
Problem: Phase III Progression Outcomes Goal: Time patient transferred to PCTU/Telemetry POD Outcome: Completed/Met Date Met:  08/17/12 1800-ambulated safely from 2301 to 2017, tolerated ambulation well. VS stable prior and during the transfer. Patient's only belongings (eye-glasses) are worn by him. Spouse at bedside. Patient settled in chair. Report given to Fayrene Fearing, Charity fundraiser. Chayanne Speir, Charity fundraiser.

## 2012-08-17 NOTE — Progress Notes (Signed)
   CARDIOTHORACIC SURGERY PROGRESS NOTE  2 Days Post-Op  S/P Procedure(s) (LRB): MINIMALLY INVASIVE MITRAL VALVE REPAIR (MVR) (Right) INTRAOPERATIVE TRANSESOPHAGEAL ECHOCARDIOGRAM (N/A)  Subjective: Looks very good.  Minimal soreness.  Feels pretty well.  Objective: Vital signs in last 24 hours: Temp:  [98.2 F (36.8 C)-99.7 F (37.6 C)] 98.9 F (37.2 C) (05/29 0730) Pulse Rate:  [67-86] 75 (05/29 0700) Cardiac Rhythm:  [-] Atrial paced (05/28 2000) Resp:  [15-26] 21 (05/29 0700) BP: (101-132)/(56-77) 125/74 mmHg (05/29 0700) SpO2:  [94 %-100 %] 97 % (05/29 0700) Arterial Line BP: (108-125)/(57-60) 125/60 mmHg (05/28 1000) Weight:  [100.6 kg (221 lb 12.5 oz)] 100.6 kg (221 lb 12.5 oz) (05/29 0400)  Physical Exam:  Rhythm:   sinus  Breath sounds: clear  Heart sounds:  RRR w/out murmur  Incisions:  Dressing dry, intact  Abdomen:  Soft, non-distended, non-tender  Extremities:  Warm, well-perfused    Intake/Output from previous day: 05/28 0701 - 05/29 0700 In: 840.8 [P.O.:200; I.V.:590.8; IV Piggyback:50] Out: 3490 [Urine:3170; Chest Tube:320] Intake/Output this shift:    Lab Results:  Recent Labs  08/16/12 1856 08/17/12 0422  WBC 10.8* 9.8  HGB 11.6* 11.1*  HCT 33.4* 32.2*  PLT 121* 120*   BMET:  Recent Labs  08/16/12 0310 08/16/12 1856 08/17/12 0422  NA 140  --  139  K 4.2  --  3.3*  CL 106  --  101  CO2 26  --  29  GLUCOSE 117*  --  119*  BUN 15  --  18  CREATININE 1.40* 1.40* 1.37*  CALCIUM 8.4  --  8.7    CBG (last 3)   Recent Labs  08/16/12 2350 08/17/12 0239 08/17/12 0728  GLUCAP 109* 119* 120*   PT/INR:   Recent Labs  08/17/12 0422  LABPROT 16.8*  INR 1.40    CXR:  Looks good  Assessment/Plan: S/P Procedure(s) (LRB): MINIMALLY INVASIVE MITRAL VALVE REPAIR (MVR) (Right) INTRAOPERATIVE TRANSESOPHAGEAL ECHOCARDIOGRAM (N/A)  Doing well POD2 Sinus rhythm w/ stable hemodynamics Expected post op acute blood loss anemia, mild,  stable  Expected post op volume excess, mild, diuresing  Expected post op atelectasis, mild  Pre-op chronic kidney disease, creatinine stable at baseline  Long-standing HTN  Recurrent PE  Mobilize  Diuresis  pulm toilet  continue lovenox, coumadin Leave chest tubes in 1 more day Transfer step down  OWEN,CLARENCE H 08/17/2012 7:49 AM

## 2012-08-18 ENCOUNTER — Encounter (HOSPITAL_COMMUNITY): Payer: Self-pay | Admitting: Thoracic Surgery (Cardiothoracic Vascular Surgery)

## 2012-08-18 LAB — CBC
HCT: 34.2 % — ABNORMAL LOW (ref 39.0–52.0)
Hemoglobin: 11.6 g/dL — ABNORMAL LOW (ref 13.0–17.0)
MCH: 29.9 pg (ref 26.0–34.0)
MCV: 88.1 fL (ref 78.0–100.0)
Platelets: 128 10*3/uL — ABNORMAL LOW (ref 150–400)
RBC: 3.88 MIL/uL — ABNORMAL LOW (ref 4.22–5.81)
WBC: 8.2 10*3/uL (ref 4.0–10.5)

## 2012-08-18 LAB — BASIC METABOLIC PANEL
BUN: 18 mg/dL (ref 6–23)
CO2: 29 mEq/L (ref 19–32)
Calcium: 8.9 mg/dL (ref 8.4–10.5)
Chloride: 100 mEq/L (ref 96–112)
Creatinine, Ser: 1.2 mg/dL (ref 0.50–1.35)
Glucose, Bld: 125 mg/dL — ABNORMAL HIGH (ref 70–99)

## 2012-08-18 MED ORDER — POTASSIUM CHLORIDE CRYS ER 20 MEQ PO TBCR
40.0000 meq | EXTENDED_RELEASE_TABLET | Freq: Once | ORAL | Status: AC
  Administered 2012-08-18: 40 meq via ORAL
  Filled 2012-08-18: qty 2

## 2012-08-18 NOTE — Progress Notes (Addendum)
                    [address]            [place] [address]          [phone]     3 Days Post-Op Procedure(s) (LRB): MINIMALLY INVASIVE MITRAL VALVE REPAIR (MVR) (Right) INTRAOPERATIVE TRANSESOPHAGEAL ECHOCARDIOGRAM (N/A)  Subjective: Comfortable, no complaints. Eating well. No nausea. Breathing stable.   Objective: Vital signs in last 24 hours: Patient Vitals for the past 24 hrs:  BP Temp Temp src Pulse Resp SpO2 Weight  08/18/12 0436 131/87 mmHg 98.9 F (37.2 C) Oral 74 18 96 % 218 lb 14.7 oz (99.3 kg)  08/17/12 1934 117/67 mmHg 98.8 F (37.1 C) Oral 86 18 96 % -  08/17/12 1700 140/83 mmHg - - 75 15 95 % -  08/17/12 1600 128/72 mmHg - - 79 14 96 % -  08/17/12 1537 - 98.5 F (36.9 C) Oral - - - -  08/17/12 1500 119/66 mmHg - - 75 14 96 % -  08/17/12 1400 118/76 mmHg - - 77 20 96 % -  08/17/12 1300 108/80 mmHg - - 82 21 97 % -  08/17/12 1200 135/70 mmHg - - 76 24 97 % -  08/17/12 1147 - 98.7 F (37.1 C) Oral - - - -  08/17/12 1100 122/63 mmHg - - 76 16 95 % -  08/17/12 1000 135/70 mmHg - - 79 21 96 % -  08/17/12 0900 129/78 mmHg - - 73 18 98 % -  08/17/12 0800 129/79 mmHg - - 79 21 98 % -   Current Weight  08/18/12 218 lb 14.7 oz (99.3 kg)     Intake/Output from previous day: 05/29 0701 - 05/30 0700 In: 673 [P.O.:600; I.V.:23; IV Piggyback:50] Out: 2975 [Urine:2875; Chest Tube:100]    PHYSICAL EXAM:  Heart: RRR Lungs: Slightly decreased BS in bases Wound: Clean and dry Extremities: No significant LE edema     Lab Results: CBC: Recent Labs  08/17/12 0422 08/18/12 0458  WBC 9.8 8.2  HGB 11.1* 11.6*  HCT 32.2* 34.2*  PLT 120* 128*   BMET:  Recent Labs  08/17/12 0422 08/18/12 0458  NA 139 138  K 3.3* 3.7  CL 101 100  CO2 29 29  GLUCOSE 119* 125*  BUN 18 18  CREATININE 1.37* 1.20  CALCIUM 8.7 8.9    PT/INR:  Recent Labs  08/18/12 0458  LABPROT 16.1*  INR 1.32      Assessment/Plan: S/P Procedure(s)  (LRB): MINIMALLY INVASIVE MITRAL VALVE REPAIR (MVR) (Right) INTRAOPERATIVE TRANSESOPHAGEAL ECHOCARDIOGRAM (N/A)  CV- SR, BPs generally stable. Continue Amio, Lotensin,  Coumadin.  Vol overload- diurese.  CT output decreasing. Hopefully can d/c CT today.  CKD- Cr stable, at baseline.  CRPI, pulm toilet, probably can d/c EPWs.  Possibly home this weekend if he continues to progress.    LOS: 3 days    COLLINS,GINA H 08/18/2012  I have seen and examined the patient and agree with the assessment and plan as outlined.  D/C pacing wires and chest tubes.  Possible d/c home 1-2 days.  Mannat Benedetti H 08/18/2012 10:31 AM

## 2012-08-18 NOTE — Progress Notes (Signed)
D/C EPW per protocol and as ordered, all ends intact, no bleeding/ectopy noted, pt tolerated well.  Reminded to lie supine approximately one hour. INR 1.32, VSS.  Will continue to monitor.

## 2012-08-18 NOTE — Progress Notes (Signed)
dc'ed chest tube per unit protacall

## 2012-08-18 NOTE — Progress Notes (Signed)
CARDIAC REHAB PHASE I   PRE:  Rate/Rhythm: 86SR  BP:  Supine:   Sitting: 124/72  Standing:    SaO2: 97%RA  MODE:  Ambulation: 550 ft   POST:  Rate/Rhythm: 95SR  BP:  Supine:   Sitting: 140/80  Standing:    SaO2: 96%RA 0915-1030 Pt walked 550 ft with rolling walker with steady gait. Once chest tube out, pt will not need walker. To recliner after walk. Education completed with pt and wife. Understanding voiced. Encouraged not chewing or dipping tobacco. Discussed CRP 2 and permission given to refer to [facility] program. Pt and wife to watch post op video later today.    Marcus Nutting, RN BSN  08/18/2012 10:26 AM

## 2012-08-18 NOTE — Discharge Summary (Signed)
[address]            [name] [address]          [phone]         Discharge Summary  Name: [name] DOB: Nov 11, 1945 67 y.o. MRN: [id number]   Admission Date: 08/15/2012 Discharge Date: 08/19/2012    Admitting Diagnosis: Severe mitral regurgitation   Discharge Diagnosis:  Severe mitral regurgitation Expected postoperative blood loss anemia Mild chronic renal insufficiency  Past Medical History  Diagnosis Date  . Hyperlipidemia   . Seasonal allergies   . Osteoarthritis   . Pulmonary embolism     march 2010, 2012              /   DVT x  2  LEFT 2010  . Coronary artery disease     non-obstructive by cath 2009  . Heart murmur   . Pre-diabetes   . Hypertension     Clearance with note Dr Marvel Plan on chart,  Lovonox bridging  ordered by Dr Sanjuana Kava    EKG 9/12, chst 9/12 EPIC  . Peripheral vascular disease   . Severe mitral regurgitation by prior echocardiogram   . Sleep apnea     STOP BANG SCORE 4  . S/P mitral valve repair 08/15/2012    Complex valvuloplasty including triangular resection of posterior leaflet, artificial Gore-tex neocord placement x4 and Sorin Memo 3D ring annuloplasty via right mini thoracotomy approach     Procedures: MINIMALLY INVASIVE MITRAL VALVE REPAIR - 08/15/2012  Complex valvuloplasty including triangular resection of posterior leaflet  Gore-tex neocord placement x4   Sorin Memo 3D Ring Annuloplasty (size 30mm)     HPI:  The patient is a 67 y.o. male with history of hypertension, recurrent pulmonary emboli, non-obstructive coronary artery disease, and mitral valve prolapse with mitral regurgitation. The patient has been told that he has had a heart murmur for most of his life. He underwent hip replacement in 2009 and developed shortness of breath in March of 2010. He was found to have bilateral pulmonary emboli. At that time, he was noted to have mild to moderate mitral regurgitation on  routine echocardiogram. He completed a six-month course of Coumadin, but he developed recurrent pulmonary emboli in August 2012 and was restarted on Coumadin. Echocardiogram at that time demonstrated moderate mitral regurgitation. He recently was seen in followup by Dr. Clifton James and repeat echocardiogram performed 06/20/2012 demonstrated severe mitral regurgitation with what appeared to be a flail posterior leaflet. This was confirmed by transesophageal echocardiogram performed 07/03/2012 by Dr. Shirlee Latch. Left and right heart catheterization was performed demonstrating mild, nonobstructive coronary artery disease. There was mild pulmonary hypertension. There was no significant atherosclerotic disease involving the descending thoracic or abdominal aorta or iliac vessels. The patient was referred to Dr. Cornelius Moras for elective surgical consultation. He describes stable symptoms of exertional shortness of breath, functional class II. He denies resting shortness of breath, chest discomfort, palpitations, dizzy spells, or syncope. He sometimes feels short of breath when he lays flat in bed and he occasionally wakes up in the middle night feeling of breath. He has not had lower extremity edema. His studies were reviewed, and it was felt that he would benefit from mitral valve repair and that he would be a good candidate for a minimally invasive approach. All risks, benefits and alternatives of surgery were explained in detail, and the patient  agreed to proceed.    Hospital Course:  The patient was admitted to [facility] on 08/15/2012. The patient was taken to the operating room and underwent the above procedure.    The postoperative course has generally been uneventful.  He has maintained sinus rhythm, and blood pressures have been controlled on his home medication regimen. He has had frequent PVCs and short runs of V-tach, and cardiology saw the patient and started him on a beta blocker.  His renal function has remained  stable and creatinine is at baseline. He has been restarted on Coumadin, and his INR is trending up appropriately.  His chest tubes have been removed in the standard fashion and chest x-rays have been unremarkable.  He is ambulating in the halls without problem and is tolerating a regular diet.  He has been evaluated on today's date and is ready for discharge home.     Recent vital signs:  Filed Vitals:   08/18/12 0436  BP: 131/87  Pulse: 74  Temp: 98.9 F (37.2 C)  Resp: 18    Recent laboratory studies:  CBC: Recent Labs  08/17/12 0422 08/18/12 0458  WBC 9.8 8.2  HGB 11.1* 11.6*  HCT 32.2* 34.2*  PLT 120* 128*   BMET:  Recent Labs  08/17/12 0422 08/18/12 0458  NA 139 138  K 3.3* 3.7  CL 101 100  CO2 29 29  GLUCOSE 119* 125*  BUN 18 18  CREATININE 1.37* 1.20  CALCIUM 8.7 8.9    PT/INR:  Recent Labs  08/18/12 0458  LABPROT 16.1*  INR 1.32     Discharge Medications:     Medication List    STOP taking these medications       enoxaparin 100 MG/ML injection  Commonly known as:  LOVENOX      TAKE these medications       amiodarone 200 MG tablet  Commonly known as:  PACERONE  Take 1 tablet (200 mg total) by mouth 2 (two) times daily.     aspirin 81 MG EC tablet  Take 1 tablet (81 mg total) by mouth daily.     benazepril 20 MG tablet  Commonly known as:  LOTENSIN  Take 10 mg by mouth every evening.     fenofibrate micronized 134 MG capsule  Commonly known as:  LOFIBRA  Take 134 mg by mouth every evening.     fexofenadine 30 MG tablet  Commonly known as:  ALLEGRA  Take 30 mg by mouth daily.     furosemide 40 MG tablet  Commonly known as:  LASIX  Take 1 tablet (40 mg total) by mouth daily. X 1 week     guaiFENesin 600 MG 12 hr tablet  Commonly known as:  MUCINEX  Take 600 mg by mouth 2 (two) times daily as needed for congestion.     metoprolol tartrate 12.5 mg Tabs  Commonly known as:  LOPRESSOR  Take 0.5 tablets (12.5 mg total) by mouth  2 (two) times daily.     oxyCODONE 5 MG immediate release tablet  Commonly known as:  Oxy IR/ROXICODONE  Take 1-2 tablets (5-10 mg total) by mouth every 3 (three) hours as needed for pain.     potassium chloride SA 20 MEQ tablet  Commonly known as:  K-DUR,KLOR-CON  Take 1 tablet (20 mEq total) by mouth daily. X 1 week     warfarin 5 MG tablet  Commonly known as:  COUMADIN  Take 5-10 mg by mouth daily. 5mg  on  tue, thu, sat and 10 mg on mon, wed, fri, sun       Discharge Instructions:  The patient is to refrain from driving, heavy lifting or strenuous activity.  May shower daily and clean incisions with soap and water.  May resume regular diet.   Follow Up:    Follow-up Information   Follow up with Purcell Nails, MD On 08/28/2012. (Office will contact you with an appointment)    Contact information:   [address] [employer] [address] [place] [address] [phone]       Follow up with Verne Carrow, MD. Schedule an appointment as soon as possible for a visit in 2 weeks.   Contact information:   [address] [place] [address] [phone]       Follow up with [facility] On 08/21/2012. (Have bloodwork for Coumadin (PT/INR) checked on Monday, 6/2)    Contact information:   [address], [address] [place] [address] [phone]      Sopheap Basic H 08/18/2012, 8:08 AM

## 2012-08-19 ENCOUNTER — Inpatient Hospital Stay (HOSPITAL_COMMUNITY): Payer: 59

## 2012-08-19 LAB — PROTIME-INR: Prothrombin Time: 14.8 seconds (ref 11.6–15.2)

## 2012-08-19 LAB — BASIC METABOLIC PANEL
Chloride: 98 mEq/L (ref 96–112)
GFR calc non Af Amer: 56 mL/min — ABNORMAL LOW (ref >90)
Glucose, Bld: 162 mg/dL — ABNORMAL HIGH (ref 70–99)
Potassium: 3.7 mEq/L (ref 3.5–5.1)
Sodium: 138 mEq/L (ref 135–145)

## 2012-08-19 MED ORDER — POTASSIUM CHLORIDE CRYS ER 20 MEQ PO TBCR: 20.0000 meq | EXTENDED_RELEASE_TABLET | Freq: Every day | ORAL | Status: DC

## 2012-08-19 MED ORDER — METOPROLOL TARTRATE 12.5 MG HALF TABLET: 12.5000 mg | ORAL_TABLET | Freq: Two times a day (BID) | ORAL | Status: DC

## 2012-08-19 MED ORDER — ASPIRIN 81 MG PO TBEC: 81.0000 mg | DELAYED_RELEASE_TABLET | Freq: Every day | ORAL | Status: DC

## 2012-08-19 MED ORDER — AMIODARONE HCL 200 MG PO TABS: 200.0000 mg | ORAL_TABLET | Freq: Two times a day (BID) | ORAL | Status: DC

## 2012-08-19 MED ORDER — METOPROLOL TARTRATE 25 MG PO TABS
25.0000 mg | ORAL_TABLET | Freq: Two times a day (BID) | ORAL | Status: DC
  Filled 2012-08-19 (×2): qty 1

## 2012-08-19 MED ORDER — FUROSEMIDE 40 MG PO TABS
40.0000 mg | ORAL_TABLET | Freq: Every day | ORAL | Status: DC
  Administered 2012-08-19: 40 mg via ORAL
  Filled 2012-08-19: qty 1

## 2012-08-19 MED ORDER — METOPROLOL TARTRATE 12.5 MG HALF TABLET
12.5000 mg | ORAL_TABLET | Freq: Two times a day (BID) | ORAL | Status: DC
  Administered 2012-08-19: 12.5 mg via ORAL
  Filled 2012-08-19 (×5): qty 1

## 2012-08-19 MED ORDER — OXYCODONE HCL 5 MG PO TABS: 5.0000 mg | ORAL_TABLET | ORAL | Status: DC | PRN

## 2012-08-19 MED ORDER — FUROSEMIDE 40 MG PO TABS: 40.0000 mg | ORAL_TABLET | Freq: Every day | ORAL | Status: DC

## 2012-08-19 NOTE — Progress Notes (Signed)
Pt discharging home with family.  All instructions and prescriptions given and reviewed, all questions answered. 

## 2012-08-19 NOTE — Progress Notes (Signed)
Subjective:  Asked to see patient with PVCs prior to discharge by the cardiac surgeons. The patient had a minimally invasive mitral valve repair done earlier this week and has had a good postoperative course. He has been on amiodarone as well as Lotensin. He was noted to have PVCs and occasional couplets and cardiology was asked to comment. He does not currently take a beta blocker and is not having any shortness of breath or chest pain.  Objective:  Vital Signs in the last 24 hours: BP 128/76  Pulse 76  Temp(Src) 97.9 F (36.6 C) (Oral)  Resp 18  Ht 6' (1.829 m)  Wt 96.8 kg (213 lb 6.5 oz)  BMI 28.94 kg/m2  SpO2 98%  Physical Exam: Pleasant white male who is currently in no acute distress Lungs:  Clear to  Cardiac:  Regular rhythm, normal S1 and S2, no S3 Abdomen:  Soft, nontender, no masses Extremities:  No edema present  Intake/Output from previous day: 05/30 0701 - 05/31 0700 In: 480 [P.O.:480] Out: 1750 [Urine:1750] Weight Filed Weights   08/17/12 0400 08/18/12 0436 08/19/12 0402  Weight: 100.6 kg (221 lb 12.5 oz) 99.3 kg (218 lb 14.7 oz) 96.8 kg (213 lb 6.5 oz)    Lab Results: Basic Metabolic Panel:  Recent Labs  16/10/96 0458 08/19/12 0925  NA 138 138  K 3.7 3.7  CL 100 98  CO2 29 29  GLUCOSE 125* 162*  BUN 18 20  CREATININE 1.20 1.30    CBC:  Recent Labs  08/17/12 0422 08/18/12 0458  WBC 9.8 8.2  HGB 11.1* 11.6*  HCT 32.2* 34.2*  MCV 88.2 88.1  PLT 120* 128*    BNP    Component Value Date/Time   PROBNP 123.0* 05/24/2008 1147    Telemetry:  Sinus rhythm. Review of strips show occasional PVCs and occasional couplets. No sustained ventricular tachycardias noted.  Assessment/Plan:  1. Isolated PVCs and couplets occurring in the postoperative period. His ventricular function was previously normal echo preoperatively. I would add a low-dose beta blocker prior to discharge and would suggest metoprolol 25 mg twice daily. From a cardiac viewpoint  he may be discharged home and followup with his cardiologist as an outpatient. He should continue his other medications.     Darden Palmer  MD [facility]  08/19/2012, 11:08 AM

## 2012-08-19 NOTE — Progress Notes (Signed)
[name] has walked independently in the hallway this morning.

## 2012-08-19 NOTE — Progress Notes (Signed)
Pt is experiencing PVCs and PACs upon getting up and using the urinal. After the patient finishes urinating, his HR returns to SR. [name]

## 2012-08-19 NOTE — Progress Notes (Addendum)
       [address]       [name] [address]             [phone]       4 Days Post-Op Procedure(s) (LRB): MINIMALLY INVASIVE MITRAL VALVE REPAIR (MVR) (Right) INTRAOPERATIVE TRANSESOPHAGEAL ECHOCARDIOGRAM (N/A)  Subjective: Feels well, wants to go home.   Objective: Vital signs in last 24 hours: Patient Vitals for the past 24 hrs:  BP Temp Temp src Pulse Resp SpO2 Weight  08/19/12 0402 128/76 mmHg 97.9 F (36.6 C) Oral 76 18 98 % 213 lb 6.5 oz (96.8 kg)  08/18/12 1935 122/75 mmHg 98.7 F (37.1 C) Oral 77 18 99 % -  08/18/12 1400 134/73 mmHg 99.1 F (37.3 C) Oral 81 18 97 % -   Current Weight  08/19/12 213 lb 6.5 oz (96.8 kg)   08/18/12          218 lb 14.7 oz (99.3 kg)   Intake/Output from previous day: 05/30 0701 - 05/31 0700 In: 480 [P.O.:480] Out: 1750 [Urine:1750]    PHYSICAL EXAM:  Heart: RRR, freq PVCs Lungs: Clear Wound: Clean and dry Extremities: Trace LE edema    Lab Results: CBC: Recent Labs  08/17/12 0422 08/18/12 0458  WBC 9.8 8.2  HGB 11.1* 11.6*  HCT 32.2* 34.2*  PLT 120* 128*   BMET:  Recent Labs  08/17/12 0422 08/18/12 0458  NA 139 138  K 3.3* 3.7  CL 101 100  CO2 29 29  GLUCOSE 119* 125*  BUN 18 18  CREATININE 1.37* 1.20  CALCIUM 8.7 8.9    PT/INR:  Recent Labs  08/19/12 0355  LABPROT 14.8  INR 1.18   CXR: Findings: Improved aeration. Minimal residual subsegmental  atelectasis or infiltrate in the right lower lobe. Left lung  clear. No effusion. Heart size upper limits normal. Regional  bones unremarkable.  IMPRESSION:  Improved aeration with minimal residual right lower lobe  atelectasis or infiltrate.    Assessment/Plan: S/P Procedure(s) (LRB): MINIMALLY INVASIVE MITRAL VALVE REPAIR (MVR) (Right) INTRAOPERATIVE TRANSESOPHAGEAL ECHOCARDIOGRAM (N/A) CV- having frequent PVCs.  Not on a beta blocker.  BPs generally stable. Will discuss with MD. Check labs since he did has been  hypokalemic.Continue Amio, Lotensin. Vol overload- diurese. Hopefully home later today vs in am.   LOS: 4 days    COLLINS,GINA H 08/19/2012  Patient seen by cardiology suggested low dose beta blocker and home today I have seen and examined [name] and agree with the above assessment  and plan.  Delight Ovens MD [facility] [phone] Office [phone] 08/19/2012 11:09 AM

## 2012-08-21 ENCOUNTER — Ambulatory Visit (INDEPENDENT_AMBULATORY_CARE_PROVIDER_SITE_OTHER): Payer: 59 | Admitting: *Deleted

## 2012-08-21 DIAGNOSIS — Z9889 Other specified postprocedural states: Secondary | ICD-10-CM

## 2012-08-21 DIAGNOSIS — I059 Rheumatic mitral valve disease, unspecified: Secondary | ICD-10-CM

## 2012-08-21 DIAGNOSIS — I2699 Other pulmonary embolism without acute cor pulmonale: Secondary | ICD-10-CM

## 2012-08-21 DIAGNOSIS — I340 Nonrheumatic mitral (valve) insufficiency: Secondary | ICD-10-CM

## 2012-08-21 DIAGNOSIS — Z7901 Long term (current) use of anticoagulants: Secondary | ICD-10-CM

## 2012-08-21 LAB — POCT INR: INR: 1.2

## 2012-08-28 ENCOUNTER — Ambulatory Visit
Admission: RE | Admit: 2012-08-28 | Discharge: 2012-08-28 | Disposition: A | Discharge status: Home / Self Care | Payer: 59 | Source: Ambulatory Visit | Attending: Thoracic Surgery (Cardiothoracic Vascular Surgery) | Admitting: Thoracic Surgery (Cardiothoracic Vascular Surgery)

## 2012-08-28 ENCOUNTER — Encounter: Payer: Self-pay | Admitting: Thoracic Surgery (Cardiothoracic Vascular Surgery)

## 2012-08-28 ENCOUNTER — Ambulatory Visit (INDEPENDENT_AMBULATORY_CARE_PROVIDER_SITE_OTHER): Payer: Self-pay | Admitting: Thoracic Surgery (Cardiothoracic Vascular Surgery)

## 2012-08-28 ENCOUNTER — Other Ambulatory Visit: Payer: Self-pay

## 2012-08-28 ENCOUNTER — Ambulatory Visit (INDEPENDENT_AMBULATORY_CARE_PROVIDER_SITE_OTHER): Payer: 59 | Admitting: *Deleted

## 2012-08-28 VITALS — BP 116/75 | HR 78 | Resp 20 | Ht 72.0 in | Wt 213.0 lb

## 2012-08-28 DIAGNOSIS — Z9889 Other specified postprocedural states: Secondary | ICD-10-CM

## 2012-08-28 DIAGNOSIS — I059 Rheumatic mitral valve disease, unspecified: Secondary | ICD-10-CM

## 2012-08-28 DIAGNOSIS — Z7901 Long term (current) use of anticoagulants: Secondary | ICD-10-CM

## 2012-08-28 DIAGNOSIS — I340 Nonrheumatic mitral (valve) insufficiency: Secondary | ICD-10-CM

## 2012-08-28 DIAGNOSIS — I2699 Other pulmonary embolism without acute cor pulmonale: Secondary | ICD-10-CM

## 2012-08-28 MED ORDER — HYDROCODONE-ACETAMINOPHEN 10-325 MG PO TABS: 1.0000 | ORAL_TABLET | Freq: Four times a day (QID) | ORAL | Status: DC | PRN

## 2012-08-28 MED ORDER — AMIODARONE HCL 200 MG PO TABS: 200.0000 mg | ORAL_TABLET | Freq: Every day | ORAL | Status: DC

## 2012-08-28 NOTE — Patient Instructions (Signed)
The patient may continue to gradually increase their physical activity as tolerated.  They should refrain from any heavy lifting or strenuous use of their arms and shoulders until at least 8 weeks from the time of their surgery, and they should avoid activities that cause increased pain in their chest on the side of their surgical incision.  Otherwise they may continue to increase their activities without any particular limitations.  The patient may return to driving an automobile as long as they are no longer requiring oral narcotic pain relievers during the daytime.  It would be wise to start driving only short distances during the daylight and gradually increase from there as they feel comfortable.  The patient is encouraged to enroll and participate in the outpatient cardiac rehab program beginning as soon as practical.  

## 2012-08-28 NOTE — Progress Notes (Signed)
[address]       [name] [address]             [phone]     CARDIOTHORACIC SURGERY OFFICE NOTE  Referring Provider is [name] PCP is Nadean Corwin, MD   HPI:  Patient returns for routine followup status post minimally invasive mitral valve repair on Aug 15 2012. His postoperative recovery has been uncomplicated. Since hospital discharge the patient has continued to do very well. He returns to the office today feeling well with exception of mild soreness in his right chest. He has not been taking any pain relievers other than extra strength Tylenol. He states that the oxycodone was too powerful and made him see things that were there. The soreness in his chest really only bothersome at night when he is trying to sleep. He is been up and around fairly well and walking on a regular basis. He has no shortness of breath. His appetite is good. He has not had any tachypalpitations or dizzy spells. His prothrombin time was checked earlier today and INR 2.5. Overall he has no complaints.   Current Outpatient Prescriptions  Medication Sig Dispense Refill  . amiodarone (PACERONE) 200 MG tablet Take 1 tablet (200 mg total) by mouth 2 (two) times daily.  60 tablet  1  . aspirin EC 81 MG EC tablet Take 1 tablet (81 mg total) by mouth daily.      . benazepril (LOTENSIN) 20 MG tablet Take 10 mg by mouth every evening.       . fenofibrate micronized (LOFIBRA) 134 MG capsule Take 134 mg by mouth every evening.       . fexofenadine (ALLEGRA) 30 MG tablet Take 30 mg by mouth daily.      Marland Kitchen guaiFENesin (MUCINEX) 600 MG 12 hr tablet Take 600 mg by mouth 2 (two) times daily as needed for congestion.       . metoprolol tartrate (LOPRESSOR) 12.5 mg TABS Take 0.5 tablets (12.5 mg total) by mouth 2 (two) times daily.  30 tablet  1  . warfarin (COUMADIN) 5 MG tablet Take 5-10 mg by mouth daily. 5mg  on tue, thu, sat and 10 mg on mon, wed, fri, sun      .  oxyCODONE (OXY IR/ROXICODONE) 5 MG immediate release tablet Take 1-2 tablets (5-10 mg total) by mouth every 3 (three) hours as needed for pain.  30 tablet  0  . [DISCONTINUED] loratadine (CLARITIN) 10 MG tablet Take 10 mg by mouth daily.         No current facility-administered medications for this visit.      Physical Exam:   BP 116/75  Pulse 78  Resp 20  Ht 6' (1.829 m)  Wt 213 lb (96.616 kg)  BMI 28.88 kg/m2  SpO2 96%  General:  Well-appearing  Chest:   Clear to auscultation  CV:   Regular rate and rhythm without murmur  Incisions:  Clean and dry and healing nicely, chest tube sutures are removed  Abdomen:  Soft nontender  Extremities:  Warm and well-perfused  Diagnostic Tests:  *RADIOLOGY REPORT*  Clinical Data: Right-sided chest pain, severe mitral regurgitation,  mitral valve repair in May 2014, history of smoking  CHEST - 2 VIEW  Comparison: Chest x-ray of 08/19/2012  Findings: No active infiltrate or effusion is seen. The heart is  mildly enlarged and stable. Mitral valve repair is noted. There  are degenerative changes throughout the thoracic spine.  IMPRESSION:  No active lung disease. Stable mild cardiomegaly. Mitral valve  replacement.  Original Report Authenticated By: Dwyane Dee, M.D.    Impression:  The patient is doing quite well less than 2 weeks following minimally invasive mitral valve repair  Plan:  Now that the patient is therapeutic on Coumadin again I've instructed him to stop taking aspirin. We also cut his dose of amiodarone to 200 mg daily. We have not made any other changes to his current medications. I've encouraged patient to continue to gradually increase his physical activity but to avoid things that make his chest soreness worse such as heavy lifting or pulling or pushing with his arms and shoulders. I think he he may resume driving an automobile and I've encouraged him to get started the cardiac rehabilitation program as soon as possible.  All of his questions been addressed. We will have him return in 4 weeks for routine followup.   Salvatore Decent. Cornelius Moras, MD 08/28/2012 11:39 AM

## 2012-09-05 ENCOUNTER — Other Ambulatory Visit (HOSPITAL_COMMUNITY): Payer: 59

## 2012-09-05 ENCOUNTER — Encounter: Payer: Self-pay | Admitting: Nurse Practitioner

## 2012-09-05 ENCOUNTER — Ambulatory Visit (INDEPENDENT_AMBULATORY_CARE_PROVIDER_SITE_OTHER): Payer: 59 | Admitting: Nurse Practitioner

## 2012-09-05 VITALS — BP 110/68 | HR 69 | Ht 72.0 in | Wt 216.0 lb

## 2012-09-05 DIAGNOSIS — Z9889 Other specified postprocedural states: Secondary | ICD-10-CM

## 2012-09-05 DIAGNOSIS — I059 Rheumatic mitral valve disease, unspecified: Secondary | ICD-10-CM

## 2012-09-05 LAB — CBC WITH DIFFERENTIAL/PLATELET
Basophils Absolute: 0.1 10*3/uL (ref 0.0–0.1)
Basophils Relative: 1.2 % (ref 0.0–3.0)
Eosinophils Absolute: 0.3 10*3/uL (ref 0.0–0.7)
Eosinophils Relative: 6.3 % — ABNORMAL HIGH (ref 0.0–5.0)
HCT: 37.3 % — ABNORMAL LOW (ref 39.0–52.0)
Hemoglobin: 12.5 g/dL — ABNORMAL LOW (ref 13.0–17.0)
Lymphocytes Relative: 26.8 % (ref 12.0–46.0)
Lymphs Abs: 1.4 10*3/uL (ref 0.7–4.0)
MCHC: 33.6 g/dL (ref 30.0–36.0)
MCV: 90.9 fl (ref 78.0–100.0)
Monocytes Absolute: 0.2 10*3/uL (ref 0.1–1.0)
Monocytes Relative: 4.1 % (ref 3.0–12.0)
Neutro Abs: 3.2 10*3/uL (ref 1.4–7.7)
Neutrophils Relative %: 61.6 % (ref 43.0–77.0)
Platelets: 377 10*3/uL (ref 150.0–400.0)
RBC: 4.11 Mil/uL — ABNORMAL LOW (ref 4.22–5.81)
RDW: 14.4 % (ref 11.5–14.6)
WBC: 5.2 10*3/uL (ref 4.5–10.5)

## 2012-09-05 LAB — BASIC METABOLIC PANEL
BUN: 22 mg/dL (ref 6–23)
CO2: 22 mEq/L (ref 19–32)
Calcium: 10 mg/dL (ref 8.4–10.5)
Chloride: 106 mEq/L (ref 96–112)
Creatinine, Ser: 1.7 mg/dL — ABNORMAL HIGH (ref 0.4–1.5)
GFR: 53.94 mL/min — ABNORMAL LOW (ref >60.00)
Glucose, Bld: 152 mg/dL — ABNORMAL HIGH (ref 70–99)
Potassium: 4.2 mEq/L (ref 3.5–5.1)
Sodium: 140 mEq/L (ref 135–145)

## 2012-09-05 NOTE — Progress Notes (Addendum)
[name] Date of Birth: 01-Sep-1945 Medical Record #[id number]  History of Present Illness: [name] is seen back today for a post hospital visit. Seen for Dr. Clifton James. He has a history of HTN, recurrent pulmonary emboli on anticoagulation, nonobstructive CAD and prior MV prolapse with MR.   Has had recent exertional shortness of breath and was referred for MV repair due to severe MR and a flail posterior leaflet. Post op course was uneventful. Did have some PVCs and short runs of VT and was started on beta blocker therapy. Has had his coumadin restarted. Has already seen Dr. Cornelius Moras back for a post op visit last week - aspirin was stopped - amiodarone was cut back.   Comes in today. He is here with his wife. Doing well. No chest pain. Not short of breath. Not dizzy or lightheaded. Bowels have been a little sluggish - he is using some stool softeners. Little hoarse still and some cough - gargling with salt water and says it is getting better. Still using his incentive spirometry. Some soreness of his chest. Wants to go to cardiac rehab - has not heard from them yet. No palpitations. Wife feels like he is doing well.   Current Outpatient Prescriptions on File Prior to Visit  Medication Sig Dispense Refill  . amiodarone (PACERONE) 200 MG tablet Take 1 tablet (200 mg total) by mouth daily.  60 tablet  1  . benazepril (LOTENSIN) 20 MG tablet Take 10 mg by mouth every evening.       . fenofibrate micronized (LOFIBRA) 134 MG capsule Take 134 mg by mouth every evening.       . fexofenadine (ALLEGRA) 30 MG tablet Take 30 mg by mouth daily.      Marland Kitchen guaiFENesin (MUCINEX) 600 MG 12 hr tablet Take 600 mg by mouth 2 (two) times daily as needed for congestion.       Marland Kitchen HYDROcodone-acetaminophen (NORCO) 10-325 MG per tablet Take 1 tablet by mouth every 6 (six) hours as needed for pain.  30 tablet  0  . metoprolol tartrate (LOPRESSOR) 12.5 mg TABS Take 0.5 tablets (12.5 mg total) by mouth 2 (two) times daily.   30 tablet  1  . warfarin (COUMADIN) 5 MG tablet Take 5-10 mg by mouth daily. 5mg  on tue, thu, sat and 10 mg on mon, wed, fri, sun      . [DISCONTINUED] loratadine (CLARITIN) 10 MG tablet Take 10 mg by mouth daily.         No current facility-administered medications on file prior to visit.    Allergies  Allergen Reactions  . Statins     REACTION: muscle aches    Past Medical History  Diagnosis Date  . Hyperlipidemia   . Seasonal allergies   . Osteoarthritis   . Pulmonary embolism     march 2010, 2012              /   DVT x  2  LEFT 2010  . Coronary artery disease     non-obstructive by cath 2009  . Heart murmur   . Pre-diabetes   . Hypertension     Clearance with note Dr Marvel Plan on chart,  Lovonox bridging  ordered by Dr Sanjuana Kava    EKG 9/12, chst 9/12 EPIC  . Peripheral vascular disease   . Severe mitral regurgitation by prior echocardiogram   . Sleep apnea     STOP BANG SCORE 4  . S/P mitral valve repair 08/15/2012  Complex valvuloplasty including triangular resection of posterior leaflet, artificial Gore-tex neocord placement x4 and Sorin Memo 3D ring annuloplasty via right mini thoracotomy approach    Past Surgical History  Procedure Laterality Date  . Achillis tendon      repair 20 years ago  . Hip arthroplasty      11/06/07  . Total hip arthroplasty  08/24/2011    Procedure: TOTAL HIP ARTHROPLASTY ANTERIOR APPROACH;  Surgeon: Shelda Pal, MD;  Location: [facility] ORS;  Service: Orthopedics;  Laterality: Right;  . Tee without cardioversion N/A 07/04/2012    Procedure: TRANSESOPHAGEAL ECHOCARDIOGRAM (TEE);  Surgeon: Laurey Morale, MD;  Location: [facility] ENDOSCOPY;  Service: Cardiovascular;  Laterality: N/A;  . Cardiac catheterization    . Mitral valve repair Right 08/15/2012    Procedure: MINIMALLY INVASIVE MITRAL VALVE REPAIR (MVR);  Surgeon: Purcell Nails, MD;  Location: [facility] OR;  Service: Open Heart Surgery;  Laterality: Right;  . Intraoperative transesophageal  echocardiogram N/A 08/15/2012    Procedure: INTRAOPERATIVE TRANSESOPHAGEAL ECHOCARDIOGRAM;  Surgeon: Purcell Nails, MD;  Location[facility] OR;  Service: Open Heart Surgery;  Laterality: N/A;    History  Smoking status  . Former Smoker  . Types: Cigars  . Quit date: 08/18/1997  Smokeless tobacco  . Former Neurosurgeon  . Types: Chew  . Quit date: 08/14/2012    History  Alcohol Use No    Family History  Problem Relation Age of Onset  . Coronary artery disease    . Heart attack Father   . Heart attack Brother 60  . Hypertension Father   . Hypertension Sister   . Hypertension Brother   . Hyperlipidemia Sister   . Diabetes Sister   . Diabetes Brother     Review of Systems: The review of systems is per the HPI.  All other systems were reviewed and are negative.  Physical Exam: BP 110/68  Pulse 69  Ht 6' (1.829 m)  Wt 216 lb (97.977 kg)  BMI 29.29 kg/m2 Patient is very pleasant and in no acute distress. Skin is warm and dry. Color is normal.  HEENT is unremarkable. Normocephalic/atraumatic. PERRL. Sclera are nonicteric. Neck is supple. No masses. No JVD. Lungs are clear. Cardiac exam shows a regular rate and rhythm. Chest incisions on the right look good. Abdomen is soft. Extremities are without edema. Gait and ROM are intact. No gross neurologic deficits noted.  LABORATORY DATA: EKG today shows sinus rhythm. RSR' noted.   Lab Results  Component Value Date   WBC 8.2 08/18/2012   HGB 11.6* 08/18/2012   HCT 34.2* 08/18/2012   PLT 128* 08/18/2012   GLUCOSE 162* 08/19/2012   CHOL  Value: 158        ATP III CLASSIFICATION:  <200     mg/dL   Desirable  962-952  mg/dL   Borderline High  >=841    mg/dL   High        05/22/4399   TRIG 115 05/26/2008   HDL 30* 05/26/2008   LDLCALC  Value: 105        Total Cholesterol/HDL:CHD Risk Coronary Heart Disease Risk Table                     Men   Women  1/2 Average Risk   3.4   3.3  Average Risk       5.0   4.4  2 X Average Risk   9.6   7.1  3 X Average Risk  23.4   11.0        Use the calculated Patient Ratio above and the CHD Risk Table to determine the patient's CHD Risk.        ATP III CLASSIFICATION (LDL):  <100     mg/dL   Optimal  161-096  mg/dL   Near or Above                    Optimal  130-159  mg/dL   Borderline  045-409  mg/dL   High  >811     mg/dL   Very High* 11/20/4780   ALT 17 08/10/2012   AST 32 08/10/2012   NA 138 08/19/2012   K 3.7 08/19/2012   CL 98 08/19/2012   CREATININE 1.30 08/19/2012   BUN 20 08/19/2012   CO2 29 08/19/2012   TSH 1.991 10/31/2010   INR 2.5 08/28/2012   HGBA1C 5.7* 08/10/2012   Lab Results  Component Value Date   INR 2.5 08/28/2012   INR 1.2 08/21/2012   INR 1.18 08/19/2012   PROTIME 18.2 09/03/2008    Assessment / Plan: 1. Severe MR with posterior flail leaflet - s/p minimally invasive MV repair per Dr. Cornelius Moras - needs follow up echo - overall he is doing well. Will check follow up labs today and arrange his echo. See Dr. Clifton James back in 4 weeks. Hope to stop his amiodarone on return. I have sent a message in EPIC to cardiac rehab that he may begin.   2. HTN - BP looks great.   3. Recurrent PE - on lifelong anticoagulation. Last INR was therapeutic.   4. PVC's/short runs of VT - on low dose amiodarone - dose cut back last week - doubt this will be a long term drug for him.   Patient is agreeable to this plan and will call if any problems develop in the interim.   Rosalio Macadamia, RN, ANP-C [facility] [address] [place], [place]  [address]

## 2012-09-05 NOTE — Patient Instructions (Addendum)
We need to check some labs today  We will arrange for a follow up echocardiogram  Stay on your current medicines  See Dr. Clifton James in 4 weeks  Call the [facility] office at [phone] if you have any questions, problems or concerns.

## 2012-09-06 ENCOUNTER — Other Ambulatory Visit: Payer: Self-pay

## 2012-09-06 DIAGNOSIS — I2510 Atherosclerotic heart disease of native coronary artery without angina pectoris: Secondary | ICD-10-CM

## 2012-09-11 ENCOUNTER — Ambulatory Visit (HOSPITAL_COMMUNITY): Payer: 59 | Attending: Cardiology | Admitting: Radiology

## 2012-09-11 DIAGNOSIS — I2510 Atherosclerotic heart disease of native coronary artery without angina pectoris: Secondary | ICD-10-CM | POA: Insufficient documentation

## 2012-09-11 DIAGNOSIS — Z87891 Personal history of nicotine dependence: Secondary | ICD-10-CM | POA: Insufficient documentation

## 2012-09-11 DIAGNOSIS — Z9889 Other specified postprocedural states: Secondary | ICD-10-CM

## 2012-09-11 DIAGNOSIS — I10 Essential (primary) hypertension: Secondary | ICD-10-CM | POA: Insufficient documentation

## 2012-09-11 DIAGNOSIS — I059 Rheumatic mitral valve disease, unspecified: Secondary | ICD-10-CM

## 2012-09-11 DIAGNOSIS — Z09 Encounter for follow-up examination after completed treatment for conditions other than malignant neoplasm: Secondary | ICD-10-CM | POA: Insufficient documentation

## 2012-09-11 NOTE — Progress Notes (Signed)
Echocardiogram performed.  

## 2012-09-14 ENCOUNTER — Telehealth: Payer: Self-pay | Admitting: *Deleted

## 2012-09-14 ENCOUNTER — Encounter (HOSPITAL_COMMUNITY)
Admission: RE | Admit: 2012-09-14 | Discharge: 2012-09-14 | Disposition: A | Discharge status: Home / Self Care | Payer: 59 | Source: Ambulatory Visit | Attending: Cardiovascular Disease | Admitting: Cardiovascular Disease

## 2012-09-14 DIAGNOSIS — Z5189 Encounter for other specified aftercare: Secondary | ICD-10-CM | POA: Insufficient documentation

## 2012-09-14 DIAGNOSIS — I739 Peripheral vascular disease, unspecified: Secondary | ICD-10-CM | POA: Insufficient documentation

## 2012-09-14 DIAGNOSIS — I10 Essential (primary) hypertension: Secondary | ICD-10-CM | POA: Insufficient documentation

## 2012-09-14 DIAGNOSIS — E785 Hyperlipidemia, unspecified: Secondary | ICD-10-CM | POA: Insufficient documentation

## 2012-09-14 DIAGNOSIS — I517 Cardiomegaly: Secondary | ICD-10-CM | POA: Insufficient documentation

## 2012-09-14 DIAGNOSIS — I2510 Atherosclerotic heart disease of native coronary artery without angina pectoris: Secondary | ICD-10-CM | POA: Insufficient documentation

## 2012-09-14 NOTE — Telephone Encounter (Signed)
PT WALKED  INTO OFFICE  WITH  MEDICATION  QUESTIONS  HAS NOTED  AN HOUR AFTER TAKING  METOPROLOL  FEELS BAD  COMPLAINS OF WEAKNESS,DIZZINESS ,AND  BLURRED VISION, HELD DOSE THIS AM AND FEELS FINE  DISCUSSED WITH DR MCALHANY  PER DR MCALHANY  MAY STOP  METOPROLOL INSTRUCTED PT TO MONITOR HEART RATE AND B/P VERBALIZED UNDERSTANDING./CY

## 2012-09-14 NOTE — Progress Notes (Signed)
Cardiac Rehab Medication Review by a Pharmacist  Does the patient  feel that his/her medications are working for him/her?  yes  Has the patient been experiencing any side effects to the medications prescribed?  no  Does the patient measure his/her own blood pressure or blood glucose at home?  yes   Does the patient have any problems obtaining medications due to transportation or finances?   no  Understanding of regimen: excellent Understanding of indications: excellent Potential of compliance: excellent    Pharmacist comments: none    Laurence Slate 09/14/2012 8:58 AM

## 2012-09-18 ENCOUNTER — Encounter (HOSPITAL_COMMUNITY)
Admission: RE | Admit: 2012-09-18 | Discharge: 2012-09-18 | Disposition: A | Discharge status: Home / Self Care | Payer: 59 | Source: Ambulatory Visit | Attending: Cardiovascular Disease | Admitting: Cardiovascular Disease

## 2012-09-18 ENCOUNTER — Encounter (HOSPITAL_COMMUNITY): Payer: Self-pay

## 2012-09-18 ENCOUNTER — Ambulatory Visit (INDEPENDENT_AMBULATORY_CARE_PROVIDER_SITE_OTHER): Payer: 59 | Admitting: *Deleted

## 2012-09-18 DIAGNOSIS — I2699 Other pulmonary embolism without acute cor pulmonale: Secondary | ICD-10-CM

## 2012-09-18 DIAGNOSIS — Z7901 Long term (current) use of anticoagulants: Secondary | ICD-10-CM

## 2012-09-18 DIAGNOSIS — Z9889 Other specified postprocedural states: Secondary | ICD-10-CM

## 2012-09-18 DIAGNOSIS — I059 Rheumatic mitral valve disease, unspecified: Secondary | ICD-10-CM

## 2012-09-18 DIAGNOSIS — I340 Nonrheumatic mitral (valve) insufficiency: Secondary | ICD-10-CM

## 2012-09-18 LAB — POCT INR: INR: 3.6

## 2012-09-18 NOTE — Progress Notes (Addendum)
Pt started cardiac rehab today.  Pt tolerated light exercise without difficulty.  VSS, telemetry-sinus rhythm, freq. PVC with couplets.   Pt psychosocial assessment reveals no barriers to rehab participation.  Pt quality of life is slightly altered by his physical constraints which limits his ability to perform tasks as prior to his illness. 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  Pt does wear a respirator while working in hazardous conditions.  .  Pt reports in recent years he has also had total knee replacements and pulmonary embolism which have also significantly changed his life.  Pt exhibits positive coping skills and has supportive family.  Offered emotional support and reassurance.  Will continue to monitor.  Pt oriented to exercise equipment and routine.  Understanding verbalized.

## 2012-09-20 ENCOUNTER — Encounter (HOSPITAL_COMMUNITY)
Admission: RE | Admit: 2012-09-20 | Discharge: 2012-09-20 | Disposition: A | Discharge status: Home / Self Care | Payer: 59 | Source: Ambulatory Visit | Attending: Cardiovascular Disease | Admitting: Cardiovascular Disease

## 2012-09-20 DIAGNOSIS — Z5189 Encounter for other specified aftercare: Secondary | ICD-10-CM | POA: Insufficient documentation

## 2012-09-20 DIAGNOSIS — I2510 Atherosclerotic heart disease of native coronary artery without angina pectoris: Secondary | ICD-10-CM | POA: Insufficient documentation

## 2012-09-20 DIAGNOSIS — I10 Essential (primary) hypertension: Secondary | ICD-10-CM | POA: Insufficient documentation

## 2012-09-20 DIAGNOSIS — I517 Cardiomegaly: Secondary | ICD-10-CM | POA: Insufficient documentation

## 2012-09-20 DIAGNOSIS — I739 Peripheral vascular disease, unspecified: Secondary | ICD-10-CM | POA: Insufficient documentation

## 2012-09-20 DIAGNOSIS — E785 Hyperlipidemia, unspecified: Secondary | ICD-10-CM | POA: Insufficient documentation

## 2012-09-25 ENCOUNTER — Encounter (HOSPITAL_COMMUNITY)
Admission: RE | Admit: 2012-09-25 | Discharge: 2012-09-25 | Disposition: A | Discharge status: Home / Self Care | Payer: 59 | Source: Ambulatory Visit | Attending: Cardiovascular Disease | Admitting: Cardiovascular Disease

## 2012-09-25 LAB — GLUCOSE, CAPILLARY: Glucose-Capillary: 140 mg/dL — ABNORMAL HIGH (ref 70–99)

## 2012-09-27 ENCOUNTER — Encounter (HOSPITAL_COMMUNITY)
Admission: RE | Admit: 2012-09-27 | Discharge: 2012-09-27 | Disposition: A | Discharge status: Home / Self Care | Payer: 59 | Source: Ambulatory Visit | Attending: Cardiovascular Disease | Admitting: Cardiovascular Disease

## 2012-09-27 ENCOUNTER — Other Ambulatory Visit: Payer: Self-pay | Admitting: *Deleted

## 2012-09-27 ENCOUNTER — Encounter: Payer: Self-pay | Admitting: Cardiovascular Disease

## 2012-09-27 DIAGNOSIS — I059 Rheumatic mitral valve disease, unspecified: Secondary | ICD-10-CM

## 2012-09-29 ENCOUNTER — Encounter (HOSPITAL_COMMUNITY)
Admission: RE | Admit: 2012-09-29 | Discharge: 2012-09-29 | Disposition: A | Discharge status: Home / Self Care | Payer: 59 | Source: Ambulatory Visit | Attending: Cardiovascular Disease | Admitting: Cardiovascular Disease

## 2012-09-29 NOTE — Progress Notes (Signed)
(  late entry)  Pt c/o dizziness today at cardiac rehab.  Describes as lightheadedness.  Orthostatic VS obtained:  139/87 HR-74 lying, 129/85 HR 80 sitting, 127/85 HR 85 standing.  Pt encouraged to get adequate PO intake.  Pt given gatorade.  Pt able to exercise without difficulty.

## 2012-09-29 NOTE — Progress Notes (Signed)
Pt c/o dizziness upon arrival to cardiac rehab today.  BP- 102/64 HR-89.  Pt states he was evaluated by PCP with no change in medication except adding ASA 81mg  and Vit D 5000 units daily.  PC to Dr. Karie Schwalbe office.  Spoke to Dr Elease Hashimoto who was made aware of pt hx including present symptoms and medication list.  Per Dr. Elease Hashimoto, pt advised OK to exercise.  Increase PO intake. Pt states he drinks adequate water and eats frequently. Pt weight up 1.5kg today. Pt  denies dyspnea and edema, however admits to increased sodium intake.  Pt educated on avoiding processed meats such as bologna due to high sodium content.  Pt has appt Monday with Dr. Cornelius Moras. Rehab report faxed for MD review.

## 2012-09-29 NOTE — Progress Notes (Signed)
Reviewed home exercise with pt today.  Pt plans to walk 3 days outside of class. For exercise.  Reviewed THR, pulse, RPE, sign and symptoms, and when to call 911 or MD.  Pt voiced understanding.  Noni Saupe Academic Intern Cristy Hilts, MS, ACSM CES

## 2012-09-29 NOTE — Progress Notes (Signed)
(  late entry)  Pt c/o dizziness while walking on treadmill.  Pt had staggering gait immediately upon getting off treadmill. Dizziness persisted with rest.  Pt states he has not had anything to eat or drink today prior to coming to exercise. Pt given gatorade and peanut butter crackers.  BP-122/72 recheck 114/68.  Telemetry- sinus rhythm.  Pt reports these symptoms come and go at home also.  PC to Dr. Gibson Ramp office.  Dr. Clifton James not in office. No open appointments with other providers.  PC to pt PCP Dr. Oneta Rack.  Pt worked in to be seen today at 10:30am.  Pt states sx are relieved prior to leaving and feels comfortable driving.  Rehab report sent with pt.

## 2012-10-02 ENCOUNTER — Ambulatory Visit (INDEPENDENT_AMBULATORY_CARE_PROVIDER_SITE_OTHER): Payer: Self-pay | Admitting: Thoracic Surgery (Cardiothoracic Vascular Surgery)

## 2012-10-02 ENCOUNTER — Encounter: Payer: Self-pay | Admitting: Thoracic Surgery (Cardiothoracic Vascular Surgery)

## 2012-10-02 ENCOUNTER — Encounter (HOSPITAL_COMMUNITY)
Admission: RE | Admit: 2012-10-02 | Discharge: 2012-10-02 | Disposition: A | Discharge status: Home / Self Care | Payer: 59 | Source: Ambulatory Visit | Attending: Cardiovascular Disease | Admitting: Cardiovascular Disease

## 2012-10-02 VITALS — BP 113/80 | HR 78 | Resp 16 | Ht 72.0 in | Wt 221.1 lb

## 2012-10-02 DIAGNOSIS — I059 Rheumatic mitral valve disease, unspecified: Secondary | ICD-10-CM

## 2012-10-02 DIAGNOSIS — I340 Nonrheumatic mitral (valve) insufficiency: Secondary | ICD-10-CM

## 2012-10-02 DIAGNOSIS — Z9889 Other specified postprocedural states: Secondary | ICD-10-CM

## 2012-10-02 NOTE — Patient Instructions (Signed)
The patient may continue to gradually increase their physical activity as tolerated.  They should refrain from any heavy lifting or strenuous use of their arms and shoulders until at least 8 weeks from the time of their surgery, and they should avoid activities that cause increased pain in their chest on the side of their surgical incision.  Otherwise they may continue to increase their activities without any particular limitations.  

## 2012-10-02 NOTE — Progress Notes (Signed)
[address]       [name] [address]             [phone]     CARDIOTHORACIC SURGERY OFFICE NOTE  Referring Provider is [name] PCP is Nadean Corwin, MD   HPI:  Patient returns for followup status post minimally invasive mitral valve repair on 08/15/2012. He was last seen here in the office on 08/28/2012.  Since then he has continued to do well and he has been actively participating in the cardiac rehabilitation program. He was seen by Norma Fredrickson last month and is scheduled to see Dr. Clifton James later this month.  He reports that he has been having some intermittent mild dizzy spells. He states that he had 1 while he was exercising at cardiac rehabilitation. He was told that both his heart rate and blood pressure were fine at the time. He states that these episodes have all been transient and he has never come close to blacking out. He has not had any associated tachypalpitations. He's not had any shortness of breath nor significant chest discomfort. His appetite is good. He sleeping well at night.   Current Outpatient Prescriptions  Medication Sig Dispense Refill  . amiodarone (PACERONE) 200 MG tablet Take 1 tablet (200 mg total) by mouth daily.  60 tablet  1  . aspirin 81 MG tablet Take 81 mg by mouth daily.      . benazepril (LOTENSIN) 20 MG tablet Take 10 mg by mouth every evening.       . cholecalciferol (VITAMIN D) 1000 UNITS tablet Take 1,000 Units by mouth daily. Takes 5000units daily      . fenofibrate micronized (LOFIBRA) 134 MG capsule Take 134 mg by mouth every evening.       . fexofenadine (ALLEGRA) 30 MG tablet Take 30 mg by mouth daily.      Marland Kitchen guaiFENesin (MUCINEX) 600 MG 12 hr tablet Take 600 mg by mouth 2 (two) times daily as needed for congestion.       Marland Kitchen HYDROcodone-acetaminophen (NORCO) 10-325 MG per tablet Take 1 tablet by mouth every 6 (six) hours as needed for pain.  30 tablet  0  . warfarin (COUMADIN) 5 MG  tablet Take 5-10 mg by mouth daily. 5mg  on tue, thu, sat and 10 mg on mon, wed, fri, sun      . [DISCONTINUED] loratadine (CLARITIN) 10 MG tablet Take 10 mg by mouth daily.         No current facility-administered medications for this visit.      Physical Exam:   BP 113/80  Pulse 78  Resp 16  Ht 6' (1.829 m)  Wt 100.3 kg (221 lb 1.9 oz)  BMI 29.98 kg/m2  SpO2 96%  General:  Well-appearing  Chest:   Clear to auscultation with symmetrical breath sounds  CV:   Regular rate and rhythm without murmur  Incisions:  Healing nicely  Abdomen:  Soft and nontender  Extremities:  Warm and well-perfused, no lower extremity edema  Diagnostic Tests:  Transthoracic Echocardiography  Patient: [name], [name] MR #: [id number] Study Date: 09/11/2012 Gender: M Age: 67 Height: 182.9cm Weight: 96.2kg BSA: 2.59m^2 Pt. Status: Room:  ATTENDING Cassell Clement SONOGRAPHER Aida Raider, RDCS PERFORMING [name] ORDERING Gerhardt, Lori REFERRING Epworth, Lori REFERRING Elmore, Christopher cc:  ------------------------------------------------------------ LV EF: 50% - 55%  ------------------------------------------------------------ Indications: 424.0 Mitral valve disease.  ------------------------------------------------------------ History: PMH: Acquired from the patient and from the  patient's chart. PMH: CAD. Risk factors: Former tobacco use. Hypertension.  ------------------------------------------------------------ Study Conclusions  - Left ventricle: Abnormal septal motion The cavity size was mildly dilated. Systolic function was normal. The estimated ejection fraction was in the range of 50% to 55%. Wall motion was normal; there were no regional wall motion abnormalities. - Mitral valve: S/P repair with trivial residual regurgitation. - Left atrium: The atrium was mildly dilated. - Atrial septum: No defect or patent foramen ovale  was identified.  ------------------------------------------------------------ Labs, prior tests, procedures, and surgery: Status post Mitral Valve Repair-May 2014. Transthoracic echocardiography. M-mode, complete 2D, spectral Doppler, and color Doppler. Height: Height: 182.9cm. Height: 72in. Weight: Weight: 96.2kg. Weight: 211.6lb. Body mass index: BMI: 28.8kg/m^2. Body surface area: BSA: 2.91m^2. Blood pressure: 116/75. Patient status: Outpatient. Location: [facility]  ------------------------------------------------------------  ------------------------------------------------------------ Left ventricle: Abnormal septal motion The cavity size was mildly dilated. Systolic function was normal. The estimated ejection fraction was in the range of 50% to 55%. Wall motion was normal; there were no regional wall motion abnormalities.  ------------------------------------------------------------ Aortic valve: Structurally normal valve. Trileaflet; normal thickness leaflets. Cusp separation was normal. Mobility was not restricted. Doppler: Transvalvular velocity was within the normal range. There was no stenosis. No regurgitation.  ------------------------------------------------------------ Aorta: Aortic root: The aortic root was normal in size.  ------------------------------------------------------------ Mitral valve: S/P repair with trivial residual regurgitation. Structurally normal valve. Mobility was not restricted. Doppler: Transvalvular velocity was within the normal range. There was no evidence for stenosis. No regurgitation. Valve area by pressure half-time: 1.15cm^2. Indexed valve area by pressure half-time: 0.53cm^2/m^2. Valve area by continuity equation (using LVOT flow): 1.14cm^2. Indexed valve area by continuity equation (using LVOT flow): 0.52cm^2/m^2. Mean gradient: 5mm Hg (D). Peak gradient: 11mm Hg  (D).  ------------------------------------------------------------ Left atrium: The atrium was mildly dilated.  ------------------------------------------------------------ Atrial septum: No defect or patent foramen ovale was identified.  ------------------------------------------------------------ Right ventricle: The cavity size was normal. Wall thickness was normal. Systolic function was normal.  ------------------------------------------------------------ Pulmonic valve: Doppler: Transvalvular velocity was within the normal range. There was no evidence for stenosis. Trivial regurgitation.  ------------------------------------------------------------ Tricuspid valve: Structurally normal valve. Doppler: Transvalvular velocity was within the normal range. Mild regurgitation.  ------------------------------------------------------------ Pulmonary artery: The main pulmonary artery was normal-sized. Systolic pressure was within the normal range.  ------------------------------------------------------------ Right atrium: The atrium was normal in size.  ------------------------------------------------------------ Pericardium: The pericardium was normal in appearance. There was no pericardial effusion.  ------------------------------------------------------------ Systemic veins: Inferior vena cava: The vessel was normal in size.  ------------------------------------------------------------  2D measurements Normal Doppler measurements Normal Left ventricle Left ventricle LVID ED, 61.5 mm 43-52 Ea, lat 3.99 cm/s ------ chord, ann, tiss PLAX DP LVID ES, 46.7 mm 23-38 E/Ea, lat 31.8 ------ chord, ann, tiss 3 PLAX DP FS, chord, 24 % >29 Ea, med 5.01 cm/s ------ PLAX ann, tiss LVPW, ED 12.7 mm ------ DP IVS/LVPW 0.94 <1.3 E/Ea, med 25.3 ------ ratio, ED ann, tiss 5 Ventricular septum DP IVS, ED 11.9 mm ------ LVOT LVOT Peak vel, 91.4 cm/s ------ Diam, S 23 mm ------  S Area 4.15 cm^2 ------ VTI, S 19.2 cm ------ Diam 23 mm ------ Stroke vol 79.8 ml ------ Aorta Stroke 36.6 ml/m^2 ------ Root diam, 42 mm ------ index ED Mitral valve AAo AP 38 mm ------ Peak E vel 127 cm/s ------ diam, S Peak A vel 144 cm/s ------ Left atrium Mean vel, 99.1 cm/s ------ AP dim 51 mm ------ D AP dim 2.34 cm/m^2 <2.2 Decelerati 486 ms 150-23 index  on time 0 Pressure 191 ms ------ half-time Mean 5 mm Hg ------ gradient, D Peak 11 mm Hg ------ gradient, D Peak E/A 0.9 ------ ratio Area (PHT) 1.15 cm^2 ------ Area index 0.53 cm^2/m ------ (PHT) ^2 Area 1.14 cm^2 ------ (LVOT) continuity Area index 0.52 cm^2/m ------ (LVOT ^2 cont) Annulus 69.7 cm ------ VTI Right ventricle Sa vel, 11.8 cm/s ------ lat ann, tiss DP  ------------------------------------------------------------ Prepared and Electronically Authenticated by  Charlton Haws 2014-06-23T10:24:[id number]    Impression:  The patient is doing well nearly 6 weeks following minimally invasive mitral valve repair. Late followup echocardiogram performed 2 weeks to looks quite good with normal left ventricular function, intact mitral valve repair, no residual mitral regurgitation, and no pericardial effusion.  The patient has been having some intermittent mild dizzy spells.  Plan:  I've instructed the patient to stop taking amiodarone. He is scheduled to followup with Dr. Clifton James later this month. If dizzy spells persist he may need a Holter monitor performed if no other explanations are identified. Clinically the patient looks quite good. We will have him return in 3 months for routine followup.   Salvatore Decent. Cornelius Moras, MD 10/02/2012 2:41 PM

## 2012-10-04 ENCOUNTER — Encounter (HOSPITAL_COMMUNITY)
Admission: RE | Admit: 2012-10-04 | Discharge: 2012-10-04 | Disposition: A | Discharge status: Home / Self Care | Payer: 59 | Source: Ambulatory Visit | Attending: Cardiovascular Disease | Admitting: Cardiovascular Disease

## 2012-10-04 NOTE — Progress Notes (Signed)
Patient reported that he had experienced some dizziness at home this morning upon waking up and getting out of bed.  The symptoms resolved and he had no further dizziness at home and no dizziness upon arrival to Cardiac rehab. VSS and reported he did eat breakfast. He was able to participate in exercise today without experiencing any dizziness. Patient was recently evaluated by Dr. Oneta Rack for symptoms of dizziness. Instructed patient to follow up with his Primary physician and report the symptoms he experienced at home.

## 2012-10-05 IMAGING — CR DG CHEST 2V
2 series · 2 of 2 positions shown · non-contrast
Comparison: 11/08/2008

CLINICAL DATA: Chest pain.  Hypertension.  Blood clots.

CHEST - 2 VIEW

[w chest pa]
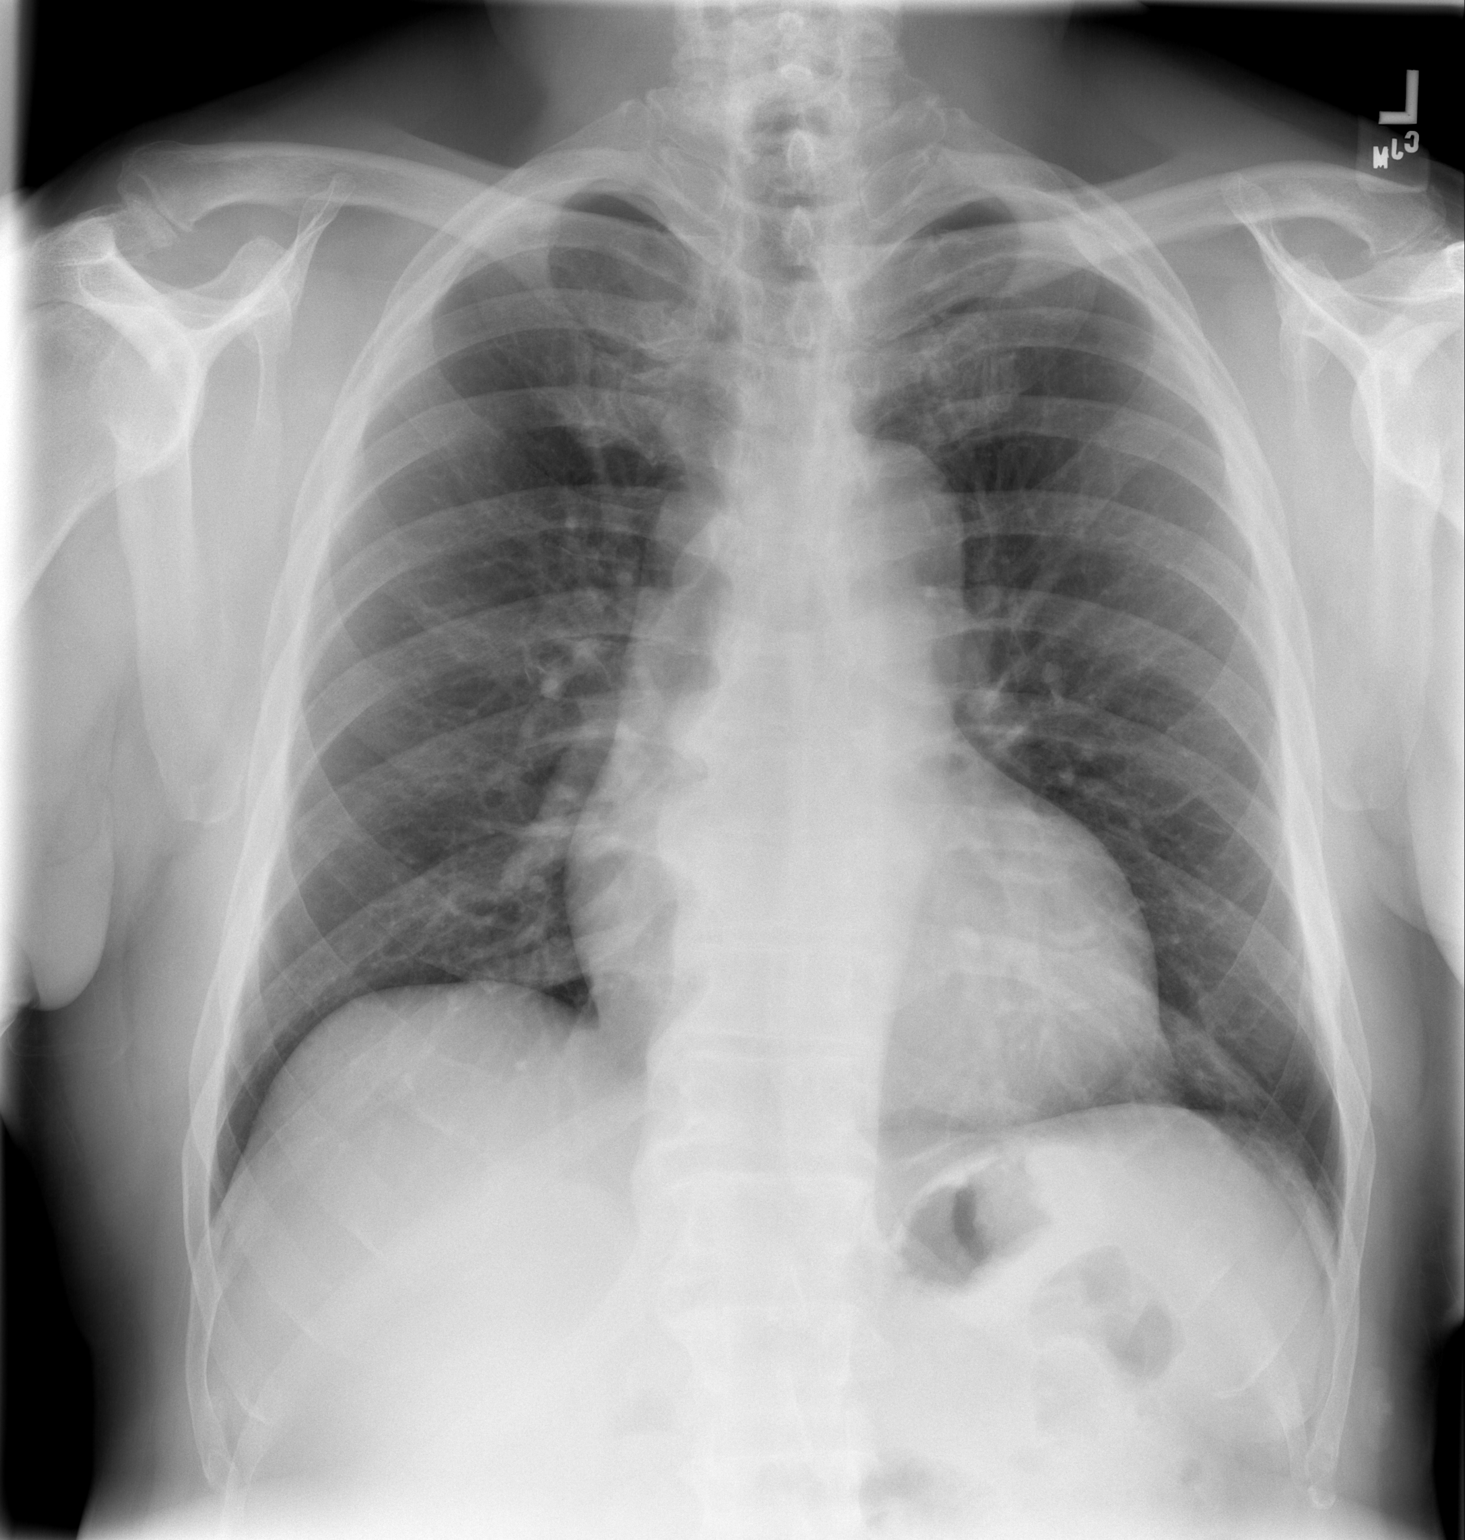

[w chest lat]
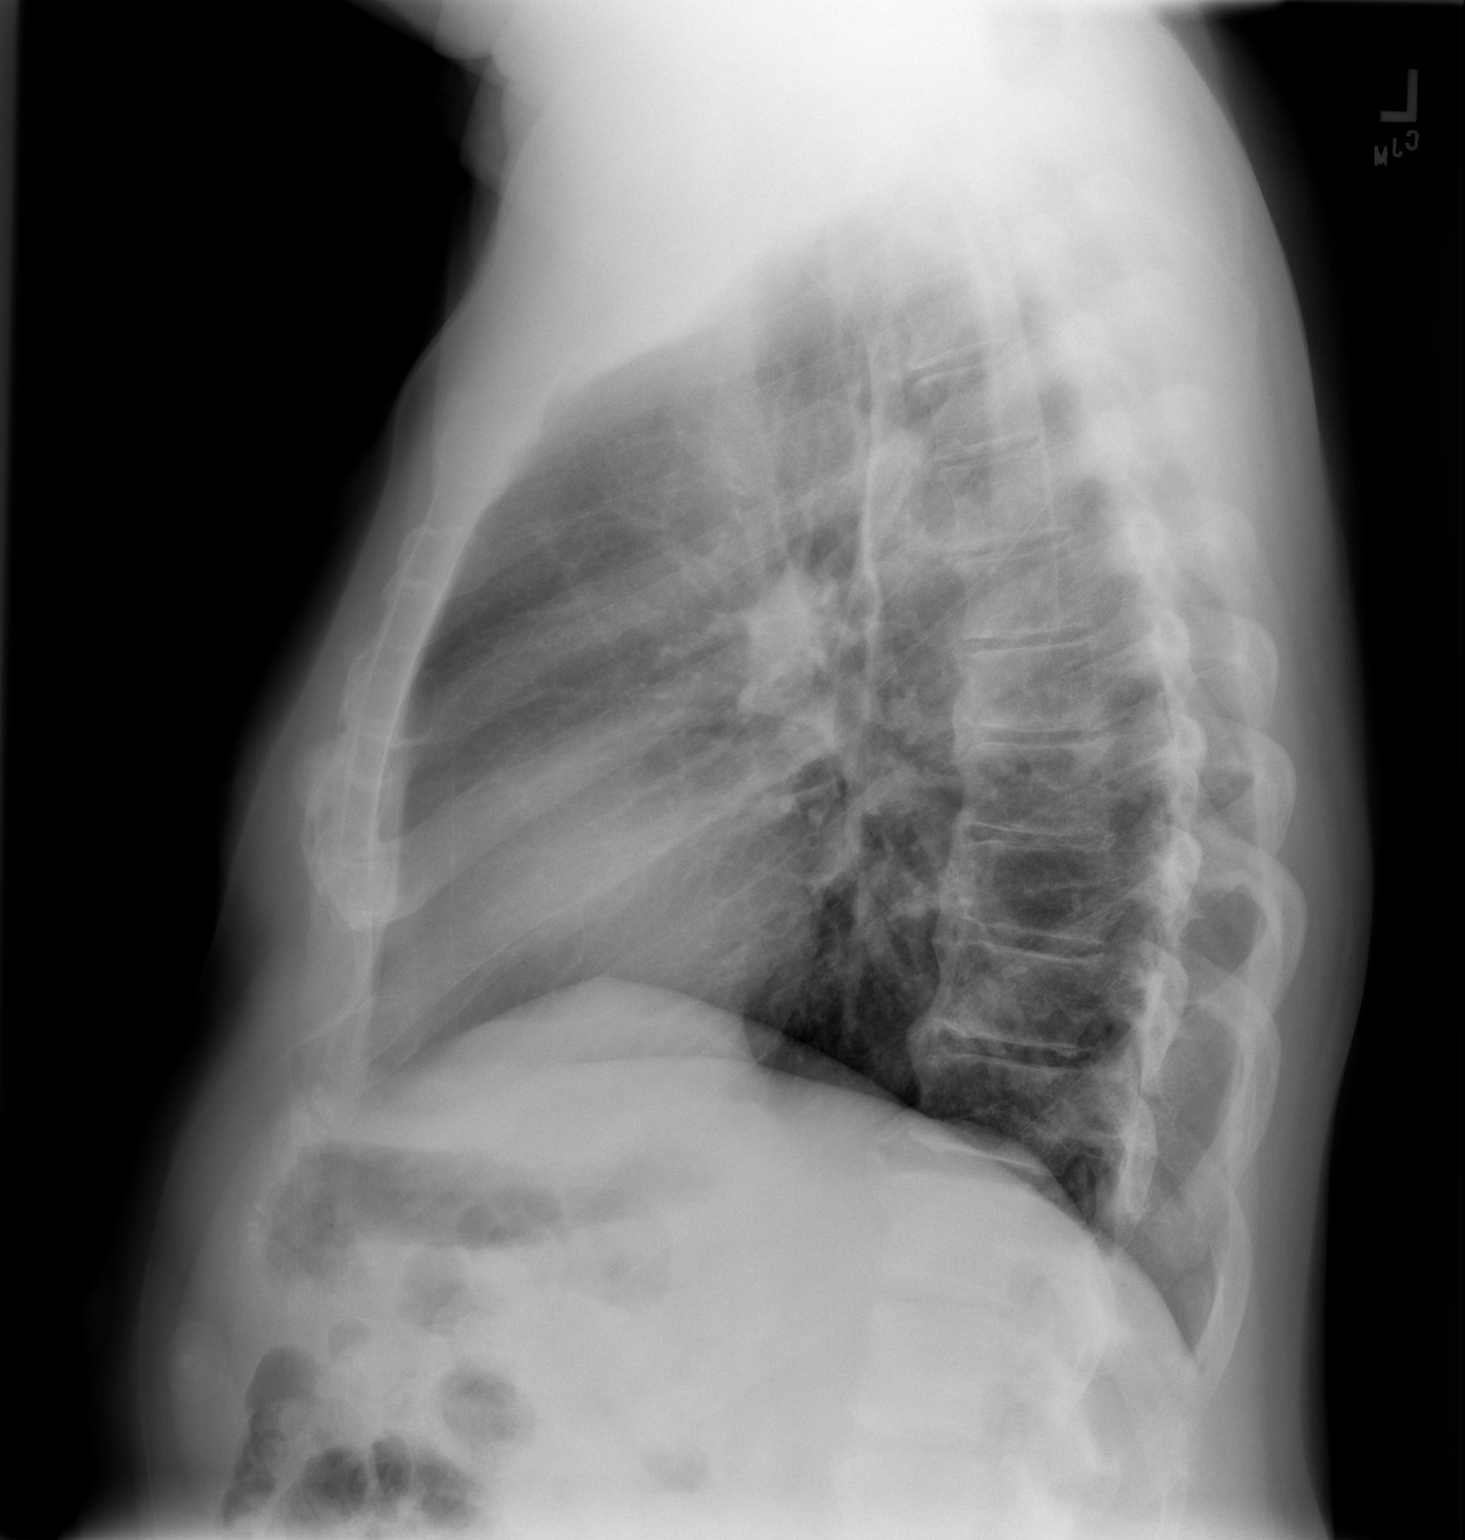

[2 of 2 positions shown; findings below may reference images not displayed]

FINDINGS: Borderline heart size with normal pulmonary vascularity.
No focal airspace infiltration.  No blunting of costophrenic
angles.  No significant changes since the previous study.
Degenerative changes in the thoracic spine.
IMPRESSION: No evidence of active pulmonary disease.

## 2012-10-06 ENCOUNTER — Encounter (HOSPITAL_COMMUNITY)
Admission: RE | Admit: 2012-10-06 | Discharge: 2012-10-06 | Disposition: A | Discharge status: Home / Self Care | Payer: 59 | Source: Ambulatory Visit | Attending: Cardiovascular Disease | Admitting: Cardiovascular Disease

## 2012-10-09 ENCOUNTER — Encounter (HOSPITAL_COMMUNITY)
Admission: RE | Admit: 2012-10-09 | Discharge: 2012-10-09 | Disposition: A | Discharge status: Home / Self Care | Payer: 59 | Source: Ambulatory Visit | Attending: Cardiovascular Disease | Admitting: Cardiovascular Disease

## 2012-10-09 ENCOUNTER — Ambulatory Visit (INDEPENDENT_AMBULATORY_CARE_PROVIDER_SITE_OTHER): Payer: 59 | Admitting: *Deleted

## 2012-10-09 DIAGNOSIS — Z9889 Other specified postprocedural states: Secondary | ICD-10-CM

## 2012-10-09 DIAGNOSIS — I340 Nonrheumatic mitral (valve) insufficiency: Secondary | ICD-10-CM

## 2012-10-09 DIAGNOSIS — I059 Rheumatic mitral valve disease, unspecified: Secondary | ICD-10-CM

## 2012-10-09 DIAGNOSIS — Z7901 Long term (current) use of anticoagulants: Secondary | ICD-10-CM

## 2012-10-09 DIAGNOSIS — I2699 Other pulmonary embolism without acute cor pulmonale: Secondary | ICD-10-CM

## 2012-10-09 NOTE — Progress Notes (Signed)
[name] 67 y.o. male Nutrition Note Spoke with pt.  Nutrition Plan and Nutrition Survey goals reviewed with pt. Pt is following Step 1 of the Therapeutic Lifestyle Changes diet. Pt is not interested in losing wt at this time. Pt recently quit using tobacco 08/15/12. Pt denies any difficulty with tobacco cessation stating "I did it cold [place]." Pt and his wife eat out frequently "because it's easier and we're busy." Pt not interested at this time in eating in more frequently. Pt is pre-diabetic. Per discussion, pt is aware of pre-diabetes and "my doctor wants me to keep my A1c less than 6." Pt is on Coumadin and is aware of the need for Consistent Vitamin K intake. Pt states, "I've been on it for forever because of the blood clot." Pt expressed understanding of the information reviewed. Pt aware of nutrition education classes offered and plans on attending nutrition classes. Nutrition Diagnosis   Food-and nutrition-related knowledge deficit related to lack of exposure to information as related to diagnosis of: ? CVD ? Pre-DM (A1c 5.7) Nutrition RX/ Estimated Daily Nutrition Needs for: wt maintenance at this time due to recent tobacco cessation 2550-2900 Kcal, 80-95 gm fat, 16-19 gm sat fat, 2.5-2.9 gm trans-fat, <1500 mg sodium   Nutrition Intervention   Pt's individual nutrition plan including cholesterol goals reviewed with pt.   Benefits of adopting Therapeutic Lifestyle Changes discussed when Medficts reviewed.   Pt to attend the Portion Distortion class   Pt to attend the  ? Nutrition I class                     ? Nutrition II class   Continue client-centered nutrition education by RD, as part of interdisciplinary care.  Goal(s)   Pt to identify and limit food sources of saturated fat, trans fat, and cholesterol  Monitor and Evaluate progress toward nutrition goal with team. Nutrition Risk:  Low   [name], M.Ed, RD, LDN, CDE 10/09/2012 9:17 AM

## 2012-10-11 ENCOUNTER — Encounter (HOSPITAL_COMMUNITY)
Admission: RE | Admit: 2012-10-11 | Discharge: 2012-10-11 | Disposition: A | Discharge status: Home / Self Care | Payer: 59 | Source: Ambulatory Visit | Attending: Cardiovascular Disease | Admitting: Cardiovascular Disease

## 2012-10-13 ENCOUNTER — Encounter (HOSPITAL_COMMUNITY)
Admission: RE | Admit: 2012-10-13 | Discharge: 2012-10-13 | Disposition: A | Discharge status: Home / Self Care | Payer: 59 | Source: Ambulatory Visit | Attending: Cardiovascular Disease | Admitting: Cardiovascular Disease

## 2012-10-16 ENCOUNTER — Encounter (HOSPITAL_COMMUNITY)
Admission: RE | Admit: 2012-10-16 | Discharge: 2012-10-16 | Disposition: A | Discharge status: Home / Self Care | Payer: 59 | Source: Ambulatory Visit | Attending: Cardiovascular Disease | Admitting: Cardiovascular Disease

## 2012-10-17 ENCOUNTER — Ambulatory Visit (INDEPENDENT_AMBULATORY_CARE_PROVIDER_SITE_OTHER): Payer: 59 | Admitting: *Deleted

## 2012-10-17 ENCOUNTER — Telehealth: Payer: Self-pay | Admitting: Cardiovascular Disease

## 2012-10-17 ENCOUNTER — Ambulatory Visit: Payer: 59 | Admitting: Cardiovascular Disease

## 2012-10-17 ENCOUNTER — Other Ambulatory Visit (INDEPENDENT_AMBULATORY_CARE_PROVIDER_SITE_OTHER): Payer: 59

## 2012-10-17 DIAGNOSIS — I2510 Atherosclerotic heart disease of native coronary artery without angina pectoris: Secondary | ICD-10-CM

## 2012-10-17 DIAGNOSIS — Z7901 Long term (current) use of anticoagulants: Secondary | ICD-10-CM

## 2012-10-17 DIAGNOSIS — Z9889 Other specified postprocedural states: Secondary | ICD-10-CM

## 2012-10-17 DIAGNOSIS — I2699 Other pulmonary embolism without acute cor pulmonale: Secondary | ICD-10-CM

## 2012-10-17 DIAGNOSIS — I340 Nonrheumatic mitral (valve) insufficiency: Secondary | ICD-10-CM

## 2012-10-17 DIAGNOSIS — I059 Rheumatic mitral valve disease, unspecified: Secondary | ICD-10-CM

## 2012-10-17 LAB — BASIC METABOLIC PANEL
BUN: 19 mg/dL (ref 6–23)
CO2: 25 mEq/L (ref 19–32)
Calcium: 9.1 mg/dL (ref 8.4–10.5)
Chloride: 105 mEq/L (ref 96–112)
Creatinine, Ser: 1.5 mg/dL (ref 0.4–1.5)
GFR: 61.61 mL/min (ref >60.00)
Glucose, Bld: 118 mg/dL — ABNORMAL HIGH (ref 70–99)
Potassium: 3.7 mEq/L (ref 3.5–5.1)
Sodium: 136 mEq/L (ref 135–145)

## 2012-10-17 LAB — POCT INR: INR: 2.9

## 2012-10-17 NOTE — Telephone Encounter (Signed)
New problem   Pt wants to know if he can go on a cruise & since his appt for today had to be r/s he's not sure what to do-he is still coming in today for coumadin & lab and would like to talk to you then if possible

## 2012-10-17 NOTE — Telephone Encounter (Signed)
Spoke with pt. He reports he is planning on flying to [place] on Friday and then leaving for a 7 day cruise. He reports he is feeling well. Amiodarone was stopped at office visit on July 14,2014 with Dr. Cornelius Moras due to dizziness. Pt reports dizziness has improved since med stopped.  I told pt I would send message to Dr. Clifton James to see if he feels pt may go on cruise.

## 2012-10-18 ENCOUNTER — Encounter (HOSPITAL_COMMUNITY)
Admission: RE | Admit: 2012-10-18 | Discharge: 2012-10-18 | Disposition: A | Discharge status: Home / Self Care | Payer: 59 | Source: Ambulatory Visit | Attending: Cardiovascular Disease | Admitting: Cardiovascular Disease

## 2012-10-18 NOTE — Telephone Encounter (Signed)
Message left on pt's identified voicemail with information from Dr. Clifton James. Left message to call back if any questions.

## 2012-10-18 NOTE — Telephone Encounter (Signed)
He can go on his cruise. Thanks, chris

## 2012-10-20 ENCOUNTER — Encounter (HOSPITAL_COMMUNITY): Payer: 59

## 2012-10-23 ENCOUNTER — Encounter (HOSPITAL_COMMUNITY): Payer: 59

## 2012-10-25 ENCOUNTER — Encounter: Payer: Self-pay | Admitting: Cardiovascular Disease

## 2012-10-25 ENCOUNTER — Encounter (HOSPITAL_COMMUNITY): Payer: 59

## 2012-10-27 ENCOUNTER — Encounter (HOSPITAL_COMMUNITY): Payer: 59

## 2012-10-30 ENCOUNTER — Encounter (HOSPITAL_COMMUNITY): Payer: 59

## 2012-11-01 ENCOUNTER — Encounter (HOSPITAL_COMMUNITY)
Admission: RE | Admit: 2012-11-01 | Discharge: 2012-11-01 | Disposition: A | Discharge status: Home / Self Care | Payer: 59 | Source: Ambulatory Visit | Attending: Cardiovascular Disease | Admitting: Cardiovascular Disease

## 2012-11-01 DIAGNOSIS — I2510 Atherosclerotic heart disease of native coronary artery without angina pectoris: Secondary | ICD-10-CM | POA: Insufficient documentation

## 2012-11-01 DIAGNOSIS — E785 Hyperlipidemia, unspecified: Secondary | ICD-10-CM | POA: Insufficient documentation

## 2012-11-01 DIAGNOSIS — I739 Peripheral vascular disease, unspecified: Secondary | ICD-10-CM | POA: Insufficient documentation

## 2012-11-01 DIAGNOSIS — I10 Essential (primary) hypertension: Secondary | ICD-10-CM | POA: Insufficient documentation

## 2012-11-01 DIAGNOSIS — Z5189 Encounter for other specified aftercare: Secondary | ICD-10-CM | POA: Insufficient documentation

## 2012-11-01 DIAGNOSIS — I517 Cardiomegaly: Secondary | ICD-10-CM | POA: Insufficient documentation

## 2012-11-03 ENCOUNTER — Encounter: Payer: Self-pay | Admitting: Cardiovascular Disease

## 2012-11-03 ENCOUNTER — Encounter (HOSPITAL_COMMUNITY)
Admission: RE | Admit: 2012-11-03 | Discharge: 2012-11-03 | Disposition: A | Discharge status: Home / Self Care | Payer: 59 | Source: Ambulatory Visit | Attending: Cardiovascular Disease | Admitting: Cardiovascular Disease

## 2012-11-06 ENCOUNTER — Encounter (HOSPITAL_COMMUNITY)
Admission: RE | Admit: 2012-11-06 | Discharge: 2012-11-06 | Disposition: A | Discharge status: Home / Self Care | Payer: 59 | Source: Ambulatory Visit | Attending: Cardiovascular Disease | Admitting: Cardiovascular Disease

## 2012-11-08 ENCOUNTER — Encounter (HOSPITAL_COMMUNITY)
Admission: RE | Admit: 2012-11-08 | Discharge: 2012-11-08 | Disposition: A | Discharge status: Home / Self Care | Payer: 59 | Source: Ambulatory Visit | Attending: Cardiovascular Disease | Admitting: Cardiovascular Disease

## 2012-11-10 ENCOUNTER — Encounter (HOSPITAL_COMMUNITY)
Admission: RE | Admit: 2012-11-10 | Discharge: 2012-11-10 | Disposition: A | Discharge status: Home / Self Care | Payer: 59 | Source: Ambulatory Visit | Attending: Cardiovascular Disease | Admitting: Cardiovascular Disease

## 2012-11-13 ENCOUNTER — Encounter (HOSPITAL_COMMUNITY)
Admission: RE | Admit: 2012-11-13 | Discharge: 2012-11-13 | Disposition: A | Discharge status: Home / Self Care | Payer: 59 | Source: Ambulatory Visit | Attending: Cardiovascular Disease | Admitting: Cardiovascular Disease

## 2012-11-14 ENCOUNTER — Ambulatory Visit (INDEPENDENT_AMBULATORY_CARE_PROVIDER_SITE_OTHER): Payer: 59 | Admitting: *Deleted

## 2012-11-14 DIAGNOSIS — I059 Rheumatic mitral valve disease, unspecified: Secondary | ICD-10-CM

## 2012-11-14 DIAGNOSIS — Z7901 Long term (current) use of anticoagulants: Secondary | ICD-10-CM

## 2012-11-14 DIAGNOSIS — Z9889 Other specified postprocedural states: Secondary | ICD-10-CM

## 2012-11-14 DIAGNOSIS — I340 Nonrheumatic mitral (valve) insufficiency: Secondary | ICD-10-CM

## 2012-11-14 DIAGNOSIS — I2699 Other pulmonary embolism without acute cor pulmonale: Secondary | ICD-10-CM

## 2012-11-15 ENCOUNTER — Encounter (HOSPITAL_COMMUNITY)
Admission: RE | Admit: 2012-11-15 | Discharge: 2012-11-15 | Disposition: A | Discharge status: Home / Self Care | Payer: 59 | Source: Ambulatory Visit | Attending: Cardiovascular Disease | Admitting: Cardiovascular Disease

## 2012-11-16 ENCOUNTER — Encounter: Payer: Self-pay | Admitting: Cardiovascular Disease

## 2012-11-17 ENCOUNTER — Encounter (HOSPITAL_COMMUNITY)
Admission: RE | Admit: 2012-11-17 | Discharge: 2012-11-17 | Disposition: A | Discharge status: Home / Self Care | Payer: 59 | Source: Ambulatory Visit | Attending: Cardiovascular Disease | Admitting: Cardiovascular Disease

## 2012-11-22 ENCOUNTER — Encounter (HOSPITAL_COMMUNITY)
Admission: RE | Admit: 2012-11-22 | Discharge: 2012-11-22 | Disposition: A | Discharge status: Home / Self Care | Payer: 59 | Source: Ambulatory Visit | Attending: Cardiovascular Disease | Admitting: Cardiovascular Disease

## 2012-11-22 ENCOUNTER — Telehealth: Payer: Self-pay | Admitting: *Deleted

## 2012-11-22 DIAGNOSIS — I10 Essential (primary) hypertension: Secondary | ICD-10-CM | POA: Insufficient documentation

## 2012-11-22 DIAGNOSIS — E785 Hyperlipidemia, unspecified: Secondary | ICD-10-CM | POA: Insufficient documentation

## 2012-11-22 DIAGNOSIS — Z5189 Encounter for other specified aftercare: Secondary | ICD-10-CM | POA: Insufficient documentation

## 2012-11-22 DIAGNOSIS — I739 Peripheral vascular disease, unspecified: Secondary | ICD-10-CM | POA: Insufficient documentation

## 2012-11-22 DIAGNOSIS — I2510 Atherosclerotic heart disease of native coronary artery without angina pectoris: Secondary | ICD-10-CM | POA: Insufficient documentation

## 2012-11-22 DIAGNOSIS — I517 Cardiomegaly: Secondary | ICD-10-CM | POA: Insufficient documentation

## 2012-11-22 LAB — GLUCOSE, CAPILLARY: Glucose-Capillary: 130 mg/dL — ABNORMAL HIGH (ref 70–99)

## 2012-11-22 NOTE — Progress Notes (Signed)
Patient reported to Cardiac rehab this morning. He requested a blood sugar check. Asked patient if he was feeling okay. Patient reported he was feeling fine this morning but yesterday he had passed out at home. He did not call emergency responders nor did he call his Cardiologist or Primary physician to report this. He said he had been to [facility] Tuesday morning and felt fine all morning. However upon returning home at about 11:30 am is when incident occurred. He reports no injury. He did not report how long he was passed out. He said his wife was home at the time and she did suggest calling 911.  Did not exercise patient today and explained to patient due to this matter will consult with Dr. Clifton James for advisement with exercise.  Patient shared he has a set appointment September 8th with Dr. Clifton James. Patient's blood pressure today was 130/70. CBG 130.  Called made this afternoon to report patient's medical situation to Dr. Gibson Ramp nurse - Dennie Bible.

## 2012-11-22 NOTE — Telephone Encounter (Signed)
Can we check and see how he is feeling? If feeling ok, he can proceed with exercise tomorrow and keep f/u appt next week. [name]

## 2012-11-22 NOTE — Telephone Encounter (Signed)
Spoke with pt who reports he is feeling OK now. He states this has happened before but not in last several weeks since medications were adjusted. He then reported yesterday's episode happened while he was driving. He did not pass completely out but felt dizzy and like he was losing control. He was close to his home and was able to continue to drive home.  I have instructed pt he should not drive until seen in office by Dr. Clifton James on November 27, 2012 and this would be discussed at that time. I told pt he could exercise at rehab on Friday if he was feeling OK. I spoke with Sue Lush at cardiac rehab and gave her this information.

## 2012-11-22 NOTE — Telephone Encounter (Signed)
Received call from Sue Lush at Cardiac Rehab. Pt was at rehab earlier today and reports he passed out yesterday. Pt had been to [place] and passed out upon returning home. Does not know how long he was out but reports it was only briefly. No problems since then. Today at rehab he was feeling fine but rehab kept him from exercising due to incident yesterday. Blood pressure today 130/70 and blood sugar 130. Sue Lush has instructed him to remain hydrated. Pt has appt with Dr. Clifton James on November 27, 2012.  Sue Lush is asking if OK for pt to exercise on Friday at Rehab.

## 2012-11-23 NOTE — Telephone Encounter (Signed)
Agree. cdm 

## 2012-11-24 ENCOUNTER — Encounter (HOSPITAL_COMMUNITY): Admission: RE | Admit: 2012-11-24 | Payer: 59 | Source: Ambulatory Visit

## 2012-11-24 NOTE — Progress Notes (Signed)
Pt arrived at cardiac rehab this morning reporting he drove himself despite MD office instruction to avoid driving until further evaluation.  Pt denies further presyncopal episodes.  Pt is asymptomatic today.  Pt did not exercise since he will need to drive himself home today.  Pt instructed to avoid driving until further advised by Dr. Clifton James.  Pt educated on dangers of driving with presyncopal symptoms.  Pt verbalized understanding.  Pt called rehab department to report his safe arrival home.

## 2012-11-27 ENCOUNTER — Encounter: Payer: Self-pay | Admitting: Cardiovascular Disease

## 2012-11-27 ENCOUNTER — Ambulatory Visit (INDEPENDENT_AMBULATORY_CARE_PROVIDER_SITE_OTHER): Payer: 59 | Admitting: *Deleted

## 2012-11-27 ENCOUNTER — Other Ambulatory Visit: Payer: Self-pay | Admitting: Cardiovascular Disease

## 2012-11-27 ENCOUNTER — Encounter (HOSPITAL_COMMUNITY): Payer: 59

## 2012-11-27 ENCOUNTER — Ambulatory Visit (INDEPENDENT_AMBULATORY_CARE_PROVIDER_SITE_OTHER): Payer: 59 | Admitting: Cardiovascular Disease

## 2012-11-27 VITALS — BP 126/82 | HR 84 | Ht 72.0 in | Wt 228.0 lb

## 2012-11-27 DIAGNOSIS — I340 Nonrheumatic mitral (valve) insufficiency: Secondary | ICD-10-CM

## 2012-11-27 DIAGNOSIS — Z7901 Long term (current) use of anticoagulants: Secondary | ICD-10-CM

## 2012-11-27 DIAGNOSIS — I2699 Other pulmonary embolism without acute cor pulmonale: Secondary | ICD-10-CM

## 2012-11-27 DIAGNOSIS — Z9889 Other specified postprocedural states: Secondary | ICD-10-CM

## 2012-11-27 DIAGNOSIS — R42 Dizziness and giddiness: Secondary | ICD-10-CM

## 2012-11-27 DIAGNOSIS — I059 Rheumatic mitral valve disease, unspecified: Secondary | ICD-10-CM

## 2012-11-27 DIAGNOSIS — I2510 Atherosclerotic heart disease of native coronary artery without angina pectoris: Secondary | ICD-10-CM

## 2012-11-27 NOTE — Patient Instructions (Addendum)
Your physician recommends that you schedule a follow-up appointment in: about 5 weeks.   Your physician has recommended that you wear an event monitor. Event monitors are medical devices that record the heart's electrical activity. Doctors most often [place] these monitors to diagnose arrhythmias. Arrhythmias are problems with the speed or rhythm of the heartbeat. The monitor is a small, portable device. You can wear one while you do your normal daily activities. This is usually used to diagnose what is causing palpitations/syncope (passing out).

## 2012-11-27 NOTE — Progress Notes (Signed)
History of Present Illness:  67 yo male with history of HTN, recurrent pulmonary emboli on anticoagulation, nonobstructive CAD and prior MV prolapse with MR now s/p MV repair May 2014 per Dr. Cornelius Moras who is here today for cardiac follow up. Post op course was uneventful but he did have some PVCs and short runs of VT and was started on beta blocker therapy. Cardiac cath April 2014 with mild CAD. He had a hip replacement 9/09 and presented with SOB 3/10 and was found to have bilateral pulmonary emboli. Repeat studies without PE or DVT so coumadin therapy was stopped 9/10. Readmitted to [facility] October 31, 2010 with SOB and found to have recurrent Pulmonary Embolism by CTA. He has been on coumadin.   He is here for follow up. He has had several episodes of dizziness. The first was while driving. He did not pass out. Several subsequent episodes of mild dizziness. No associated palpitations or awareness of irregularity of his heart rhythm. No chest pain or SOB. He has been in cardiac rehab and doing well with exercise.   Primary Care Physician: Lucky Cowboy  Past Medical History  Diagnosis Date  . Hyperlipidemia   . Seasonal allergies   . Osteoarthritis   . Pulmonary embolism     march 2010, 2012              /   DVT x  2  LEFT 2010  . Coronary artery disease     non-obstructive by cath 2009  . Heart murmur   . Pre-diabetes   . Hypertension     Clearance with note Dr Marvel Plan on chart,  Lovonox bridging  ordered by Dr Sanjuana Kava    EKG 9/12, chst 9/12 EPIC  . Peripheral vascular disease   . Severe mitral regurgitation by prior echocardiogram   . Sleep apnea     STOP BANG SCORE 4  . S/P mitral valve repair 08/15/2012    Complex valvuloplasty including triangular resection of posterior leaflet, artificial Gore-tex neocord placement x4 and Sorin Memo 3D ring annuloplasty via right mini thoracotomy approach    Past Surgical History  Procedure Laterality Date  . Achillis tendon      repair 20  years ago  . Hip arthroplasty      11/06/07  . Total hip arthroplasty  08/24/2011    Procedure: TOTAL HIP ARTHROPLASTY ANTERIOR APPROACH;  Surgeon: Shelda Pal, MD;  Location: [facility] ORS;  Service: Orthopedics;  Laterality: Right;  . Tee without cardioversion N/A 07/04/2012    Procedure: TRANSESOPHAGEAL ECHOCARDIOGRAM (TEE);  Surgeon: Laurey Morale, MD;  Location: [facility] ENDOSCOPY;  Service: Cardiovascular;  Laterality: N/A;  . Cardiac catheterization    . Mitral valve repair Right 08/15/2012    Procedure: MINIMALLY INVASIVE MITRAL VALVE REPAIR (MVR);  Surgeon: Purcell Nails, MD;  Location: [facility] OR;  Service: Open Heart Surgery;  Laterality: Right;  . Intraoperative transesophageal echocardiogram N/A 08/15/2012    Procedure: INTRAOPERATIVE TRANSESOPHAGEAL ECHOCARDIOGRAM;  Surgeon: Purcell Nails, MD;  Location: [facility] OR;  Service: Open Heart Surgery;  Laterality: N/A;    Current Outpatient Prescriptions  Medication Sig Dispense Refill  . acetaminophen (TYLENOL) 500 MG tablet Take 500 mg by mouth every 6 (six) hours as needed for pain.      . benazepril (LOTENSIN) 20 MG tablet Take 10 mg by mouth every evening.       . cholecalciferol (VITAMIN D) 1000 UNITS tablet Take 1,000 Units by mouth daily. Takes 5000units daily      .  fenofibrate micronized (LOFIBRA) 134 MG capsule Take 134 mg by mouth every evening.       . fexofenadine (ALLEGRA) 30 MG tablet Take 30 mg by mouth daily.      Marland Kitchen guaiFENesin (MUCINEX) 600 MG 12 hr tablet Take 600 mg by mouth 2 (two) times daily as needed for congestion.       Marland Kitchen warfarin (COUMADIN) 5 MG tablet TAKE AS DIRECTED BY ANTICOAGULATION CLINIC  160 tablet  1  . [DISCONTINUED] loratadine (CLARITIN) 10 MG tablet Take 10 mg by mouth daily.         No current facility-administered medications for this visit.    Allergies  Allergen Reactions  . Statins     REACTION: muscle aches    History   Social History  . Marital Status: Married    Spouse Name: N/A    Number of  Children: 3  . Years of Education: N/A   Occupational History  . Industrial hygeinist    Social History Main Topics  . Smoking status: Former Smoker    Types: Cigars    Quit date: 08/18/1997  . Smokeless tobacco: Former Neurosurgeon    Types: Chew    Quit date: 08/14/2012  . Alcohol Use: No  . Drug Use: No  . Sexual Activity: Not Currently   Other Topics Concern  . Not on file   Social History Narrative  . No narrative on file    Family History  Problem Relation Age of Onset  . Coronary artery disease    . Heart attack Father   . Heart attack Brother 60  . Hypertension Father   . Hypertension Sister   . Hypertension Brother   . Hyperlipidemia Sister   . Diabetes Sister   . Diabetes Brother     Review of Systems:  As stated in the HPI and otherwise negative.   BP 126/82  Pulse 84  Ht 6' (1.829 m)  Wt 228 lb (103.42 kg)  BMI 30.92 kg/m2  Physical Examination: General: Well developed, well nourished, NAD HEENT: OP clear, mucus membranes moist SKIN: warm, dry. No rashes. Neuro: No focal deficits Musculoskeletal: Muscle strength 5/5 all ext Psychiatric: Mood and affect normal Neck: No JVD, no carotid bruits, no thyromegaly, no lymphadenopathy. Lungs:Clear bilaterally, no wheezes, rhonci, crackles Cardiovascular: Regular rate and rhythm. No murmurs, gallops or rubs. Abdomen:Soft. Bowel sounds present. Non-tender.  Extremities: No lower extremity edema. Pulses are 2 + in the bilateral DP/PT.  EKG: NSR, rate 84 bpm. Incomplete RBBB. LAFB.   Assessment and Plan:   1. MITRAL REGURGITATION: s/p Mitral valve repair.   2. Recurrent DVT/PE: He will need coumadin for lifetime.  .  3. CAD, NATIVE VESSEL: Stable. No changes.   4. Dizziness, near syncope: He does have a history of PVCs and VT following his MV repair. No syncope and no awareness of any palpitations. BP is ok. No recent illnesses. No change on cardiac examination. Carotid dopplers normal 2013. Will plan 21 day  event monitor.

## 2012-11-29 ENCOUNTER — Encounter (INDEPENDENT_AMBULATORY_CARE_PROVIDER_SITE_OTHER): Payer: 59

## 2012-11-29 ENCOUNTER — Encounter: Payer: Self-pay | Admitting: *Deleted

## 2012-11-29 ENCOUNTER — Encounter (HOSPITAL_COMMUNITY)
Admission: RE | Admit: 2012-11-29 | Discharge: 2012-11-29 | Disposition: A | Discharge status: Home / Self Care | Payer: 59 | Source: Ambulatory Visit | Attending: Cardiovascular Disease | Admitting: Cardiovascular Disease

## 2012-11-29 DIAGNOSIS — I2510 Atherosclerotic heart disease of native coronary artery without angina pectoris: Secondary | ICD-10-CM

## 2012-11-29 DIAGNOSIS — R42 Dizziness and giddiness: Secondary | ICD-10-CM

## 2012-11-29 NOTE — Progress Notes (Signed)
Patient ID: [name], male   DOB: 12/22/1945, 67 y.o.   MRN: [id number] E-Cardio verite 21 day cardiac event monitor applied to patient.

## 2012-12-01 ENCOUNTER — Encounter (HOSPITAL_COMMUNITY)
Admission: RE | Admit: 2012-12-01 | Discharge: 2012-12-01 | Disposition: A | Discharge status: Home / Self Care | Payer: 59 | Source: Ambulatory Visit | Attending: Cardiovascular Disease | Admitting: Cardiovascular Disease

## 2012-12-04 ENCOUNTER — Encounter (HOSPITAL_COMMUNITY)
Admission: RE | Admit: 2012-12-04 | Discharge: 2012-12-04 | Disposition: A | Discharge status: Home / Self Care | Payer: 59 | Source: Ambulatory Visit | Attending: Cardiovascular Disease | Admitting: Cardiovascular Disease

## 2012-12-06 ENCOUNTER — Encounter (HOSPITAL_COMMUNITY)
Admission: RE | Admit: 2012-12-06 | Discharge: 2012-12-06 | Disposition: A | Discharge status: Home / Self Care | Payer: 59 | Source: Ambulatory Visit | Attending: Cardiovascular Disease | Admitting: Cardiovascular Disease

## 2012-12-08 ENCOUNTER — Encounter (HOSPITAL_COMMUNITY)
Admission: RE | Admit: 2012-12-08 | Discharge: 2012-12-08 | Disposition: A | Discharge status: Home / Self Care | Payer: 59 | Source: Ambulatory Visit | Attending: Cardiovascular Disease | Admitting: Cardiovascular Disease

## 2012-12-11 ENCOUNTER — Encounter (HOSPITAL_COMMUNITY)
Admission: RE | Admit: 2012-12-11 | Discharge: 2012-12-11 | Disposition: A | Discharge status: Home / Self Care | Payer: 59 | Source: Ambulatory Visit | Attending: Cardiovascular Disease | Admitting: Cardiovascular Disease

## 2012-12-13 ENCOUNTER — Encounter (HOSPITAL_COMMUNITY)
Admission: RE | Admit: 2012-12-13 | Discharge: 2012-12-13 | Disposition: A | Discharge status: Home / Self Care | Payer: 59 | Source: Ambulatory Visit | Attending: Cardiovascular Disease | Admitting: Cardiovascular Disease

## 2012-12-15 ENCOUNTER — Encounter (HOSPITAL_COMMUNITY)
Admission: RE | Admit: 2012-12-15 | Discharge: 2012-12-15 | Disposition: A | Discharge status: Home / Self Care | Payer: 59 | Source: Ambulatory Visit | Attending: Cardiovascular Disease | Admitting: Cardiovascular Disease

## 2012-12-18 ENCOUNTER — Encounter (HOSPITAL_COMMUNITY)
Admission: RE | Admit: 2012-12-18 | Discharge: 2012-12-18 | Disposition: A | Discharge status: Home / Self Care | Payer: 59 | Source: Ambulatory Visit | Attending: Cardiovascular Disease | Admitting: Cardiovascular Disease

## 2012-12-20 ENCOUNTER — Encounter (HOSPITAL_COMMUNITY)
Admission: RE | Admit: 2012-12-20 | Discharge: 2012-12-20 | Disposition: A | Discharge status: Home / Self Care | Payer: 59 | Source: Ambulatory Visit | Attending: Cardiovascular Disease | Admitting: Cardiovascular Disease

## 2012-12-20 DIAGNOSIS — I2510 Atherosclerotic heart disease of native coronary artery without angina pectoris: Secondary | ICD-10-CM | POA: Insufficient documentation

## 2012-12-20 DIAGNOSIS — I517 Cardiomegaly: Secondary | ICD-10-CM | POA: Insufficient documentation

## 2012-12-20 DIAGNOSIS — E785 Hyperlipidemia, unspecified: Secondary | ICD-10-CM | POA: Insufficient documentation

## 2012-12-20 DIAGNOSIS — I739 Peripheral vascular disease, unspecified: Secondary | ICD-10-CM | POA: Insufficient documentation

## 2012-12-20 DIAGNOSIS — I10 Essential (primary) hypertension: Secondary | ICD-10-CM | POA: Insufficient documentation

## 2012-12-20 DIAGNOSIS — Z5189 Encounter for other specified aftercare: Secondary | ICD-10-CM | POA: Insufficient documentation

## 2012-12-22 ENCOUNTER — Encounter (HOSPITAL_COMMUNITY)
Admission: RE | Admit: 2012-12-22 | Discharge: 2012-12-22 | Disposition: A | Discharge status: Home / Self Care | Payer: 59 | Source: Ambulatory Visit | Attending: Cardiovascular Disease | Admitting: Cardiovascular Disease

## 2012-12-25 ENCOUNTER — Ambulatory Visit (INDEPENDENT_AMBULATORY_CARE_PROVIDER_SITE_OTHER): Payer: 59 | Admitting: *Deleted

## 2012-12-25 ENCOUNTER — Encounter (HOSPITAL_COMMUNITY)
Admission: RE | Admit: 2012-12-25 | Discharge: 2012-12-25 | Disposition: A | Discharge status: Home / Self Care | Payer: 59 | Source: Ambulatory Visit | Attending: Cardiovascular Disease | Admitting: Cardiovascular Disease

## 2012-12-25 DIAGNOSIS — Z7901 Long term (current) use of anticoagulants: Secondary | ICD-10-CM

## 2012-12-25 DIAGNOSIS — I2699 Other pulmonary embolism without acute cor pulmonale: Secondary | ICD-10-CM

## 2012-12-25 DIAGNOSIS — Z9889 Other specified postprocedural states: Secondary | ICD-10-CM

## 2012-12-25 DIAGNOSIS — I059 Rheumatic mitral valve disease, unspecified: Secondary | ICD-10-CM

## 2012-12-25 DIAGNOSIS — I340 Nonrheumatic mitral (valve) insufficiency: Secondary | ICD-10-CM

## 2012-12-27 ENCOUNTER — Encounter (HOSPITAL_COMMUNITY)
Admission: RE | Admit: 2012-12-27 | Discharge: 2012-12-27 | Disposition: A | Discharge status: Home / Self Care | Payer: 59 | Source: Ambulatory Visit | Attending: Cardiovascular Disease | Admitting: Cardiovascular Disease

## 2012-12-29 ENCOUNTER — Encounter (HOSPITAL_COMMUNITY): Admission: RE | Admit: 2012-12-29 | Payer: 59 | Source: Ambulatory Visit

## 2013-01-01 ENCOUNTER — Encounter (HOSPITAL_COMMUNITY)
Admission: RE | Admit: 2013-01-01 | Discharge: 2013-01-01 | Disposition: A | Discharge status: Home / Self Care | Payer: 59 | Source: Ambulatory Visit | Attending: Cardiovascular Disease | Admitting: Cardiovascular Disease

## 2013-01-01 ENCOUNTER — Encounter (HOSPITAL_COMMUNITY): Payer: Self-pay

## 2013-01-01 ENCOUNTER — Ambulatory Visit: Payer: 59 | Admitting: Thoracic Surgery (Cardiothoracic Vascular Surgery)

## 2013-01-01 NOTE — Progress Notes (Signed)
Pt graduated from cardiac rehab program today.  Medication list reconciled.  PHQ9 score-  0.  Pt reports improvement in his chest symptoms with rehab activities however no change in leg discomfort r/t hip pain.  Pt denies syncopal episodes or dizziness.  Pt has made significant lifestyle changes and should be commended for his success. Pt plans to continue exercise in cardiac maintenance program.

## 2013-01-03 ENCOUNTER — Encounter: Payer: Self-pay | Admitting: Cardiovascular Disease

## 2013-01-03 ENCOUNTER — Ambulatory Visit (INDEPENDENT_AMBULATORY_CARE_PROVIDER_SITE_OTHER): Payer: 59 | Admitting: Cardiovascular Disease

## 2013-01-03 ENCOUNTER — Encounter (HOSPITAL_COMMUNITY): Payer: 59

## 2013-01-03 VITALS — BP 122/80 | HR 72 | Ht 72.0 in | Wt 232.0 lb

## 2013-01-03 DIAGNOSIS — I2510 Atherosclerotic heart disease of native coronary artery without angina pectoris: Secondary | ICD-10-CM

## 2013-01-03 DIAGNOSIS — I2699 Other pulmonary embolism without acute cor pulmonale: Secondary | ICD-10-CM

## 2013-01-03 DIAGNOSIS — Z9889 Other specified postprocedural states: Secondary | ICD-10-CM

## 2013-01-03 DIAGNOSIS — Z7901 Long term (current) use of anticoagulants: Secondary | ICD-10-CM

## 2013-01-03 DIAGNOSIS — R42 Dizziness and giddiness: Secondary | ICD-10-CM

## 2013-01-03 NOTE — Progress Notes (Signed)
History of Present Illness:  67 yo male with history of HTN, recurrent pulmonary emboli on anticoagulation, nonobstructive CAD and prior MV prolapse with MR now s/p MV repair May 2014 per Dr. Cornelius Moras who is here today for cardiac follow up. Post op course was uneventful but he did have some PVCs and short runs of VT and was started on beta blocker therapy. Cardiac cath April 2014 with mild CAD. He had a hip replacement 9/09 and presented with SOB 3/10 and was found to have bilateral pulmonary emboli. Repeat studies without PE or DVT so coumadin therapy was stopped 9/10. Readmitted to [facility] October 31, 2010 with SOB and found to have recurrent Pulmonary Embolism by CTA. He has been on coumadin chronically since then. I saw him 11/27/12 after he had several episodes of dizziness. The first was while driving. He did not pass out. Several subsequent episodes of mild dizziness. No associated palpitations or awareness of irregularity of his heart rhythm. No chest pain or SOB. He has been in cardiac rehab and doing well with exercise. I arranged a 21 day event monitor which showed sinus rhythm with PVCs but no other arrythmias.   He is here today for follow up. He has been doing well. No chest pain. He has occasional SOB when lying in bed. Dry cough. No fever, chills. No further dizziness, near syncope or syncope.   Primary Care Physician: Lucky Cowboy  Past Medical History  Diagnosis Date  . Hyperlipidemia   . Seasonal allergies   . Osteoarthritis   . Pulmonary embolism     march 2010, 2012              /   DVT x  2  LEFT 2010  . Coronary artery disease     non-obstructive by cath 2009  . Heart murmur   . Pre-diabetes   . Hypertension     Clearance with note Dr Marvel Plan on chart,  Lovonox bridging  ordered by Dr Sanjuana Kava    EKG 9/12, chst 9/12 EPIC  . Peripheral vascular disease   . Severe mitral regurgitation by prior echocardiogram   . Sleep apnea     STOP BANG SCORE 4  . S/P mitral valve  repair 08/15/2012    Complex valvuloplasty including triangular resection of posterior leaflet, artificial Gore-tex neocord placement x4 and Sorin Memo 3D ring annuloplasty via right mini thoracotomy approach    Past Surgical History  Procedure Laterality Date  . Achillis tendon      repair 20 years ago  . Hip arthroplasty      11/06/07  . Total hip arthroplasty  08/24/2011    Procedure: TOTAL HIP ARTHROPLASTY ANTERIOR APPROACH;  Surgeon: Shelda Pal, MD;  Location: [facility] ORS;  Service: Orthopedics;  Laterality: Right;  . Tee without cardioversion N/A 07/04/2012    Procedure: TRANSESOPHAGEAL ECHOCARDIOGRAM (TEE);  Surgeon: Laurey Morale, MD;  Location: [facility] ENDOSCOPY;  Service: Cardiovascular;  Laterality: N/A;  . Cardiac catheterization    . Mitral valve repair Right 08/15/2012    Procedure: MINIMALLY INVASIVE MITRAL VALVE REPAIR (MVR);  Surgeon: Purcell Nails, MD;  Location: [facility] OR;  Service: Open Heart Surgery;  Laterality: Right;  . Intraoperative transesophageal echocardiogram N/A 08/15/2012    Procedure: INTRAOPERATIVE TRANSESOPHAGEAL ECHOCARDIOGRAM;  Surgeon: Purcell Nails, MD;  Location: [facility] OR;  Service: Open Heart Surgery;  Laterality: N/A;    Current Outpatient Prescriptions  Medication Sig Dispense Refill  . acetaminophen (TYLENOL) 500 MG tablet  Take 500 mg by mouth every 6 (six) hours as needed for pain.      . benazepril (LOTENSIN) 20 MG tablet Take 10 mg by mouth every evening.       . cholecalciferol (VITAMIN D) 1000 UNITS tablet Take 1,000 Units by mouth daily. Takes 5000units daily      . fenofibrate micronized (LOFIBRA) 134 MG capsule Take 134 mg by mouth every evening.       . fexofenadine (ALLEGRA) 30 MG tablet Take 30 mg by mouth daily.      Marland Kitchen guaiFENesin (MUCINEX) 600 MG 12 hr tablet Take 600 mg by mouth 2 (two) times daily as needed for congestion.       Marland Kitchen warfarin (COUMADIN) 5 MG tablet TAKE AS DIRECTED BY ANTICOAGULATION CLINIC  160 tablet  1  . [DISCONTINUED]  loratadine (CLARITIN) 10 MG tablet Take 10 mg by mouth daily.         No current facility-administered medications for this visit.    Allergies  Allergen Reactions  . Statins     REACTION: muscle aches    History   Social History  . Marital Status: Married    Spouse Name: N/A    Number of Children: 3  . Years of Education: N/A   Occupational History  . Industrial hygeinist    Social History Main Topics  . Smoking status: Former Smoker    Types: Cigars    Quit date: 08/18/1997  . Smokeless tobacco: Former Neurosurgeon    Types: Chew    Quit date: 08/14/2012  . Alcohol Use: No  . Drug Use: No  . Sexual Activity: Not Currently   Other Topics Concern  . Not on file   Social History Narrative  . No narrative on file    Family History  Problem Relation Age of Onset  . Coronary artery disease    . Heart attack Father   . Heart attack Brother 60  . Hypertension Father   . Hypertension Sister   . Hypertension Brother   . Hyperlipidemia Sister   . Diabetes Sister   . Diabetes Brother     Review of Systems:  As stated in the HPI and otherwise negative.   BP 122/80  Pulse 72  Ht 6' (1.829 m)  Wt 232 lb (105.235 kg)  BMI 31.46 kg/m2  Physical Examination: General: Well developed, well nourished, NAD HEENT: OP clear, mucus membranes moist SKIN: warm, dry. No rashes. Neuro: No focal deficits Musculoskeletal: Muscle strength 5/5 all ext Psychiatric: Mood and affect normal Neck: No JVD, no carotid bruits, no thyromegaly, no lymphadenopathy. Lungs:Clear bilaterally, no wheezes, rhonci, crackles Cardiovascular: Regular rate and rhythm. No murmurs, gallops or rubs. Abdomen:Soft. Bowel sounds present. Non-tender.  Extremities: No lower extremity edema. Pulses are 2 + in the bilateral DP/PT.  EKG: NSR, rate 84 bpm. Incomplete RBBB. LAFB.   Assessment and Plan:   1. MITRAL REGURGITATION: s/p Mitral valve repair.   2. Recurrent DVT/PE: He will need coumadin for  lifetime.  .  3. CAD, NATIVE VESSEL: Stable. No changes.   4. Dizziness, near syncope: No recrurrence over last month. Event monitor with sinus, PVCs. No VT. BP is ok at cardiac rehab. No recent illnesses. No change on cardiac examination. Carotid dopplers normal 2013. No further cardiac workup. He can drive.   5. Cough: Dry cough. No fevers. He thinks it is related to post nasal drip. It could be related to his Ace-inh. He does not wish to stop yet.  If he has continued dry cough, would stop Ace-inh and start ARB for HTN

## 2013-01-03 NOTE — Patient Instructions (Signed)
Your physician wants you to follow-up in:  6 months. You will receive a reminder letter in the mail two months in advance. If you don't receive a letter, please call our office to schedule the follow-up appointment.   

## 2013-01-05 ENCOUNTER — Other Ambulatory Visit: Payer: Self-pay | Admitting: *Deleted

## 2013-01-05 ENCOUNTER — Encounter (HOSPITAL_COMMUNITY): Payer: 59

## 2013-01-08 ENCOUNTER — Ambulatory Visit: Payer: 59 | Admitting: Thoracic Surgery (Cardiothoracic Vascular Surgery)

## 2013-01-08 ENCOUNTER — Encounter (HOSPITAL_COMMUNITY): Payer: 59

## 2013-01-10 ENCOUNTER — Encounter (HOSPITAL_COMMUNITY): Payer: 59

## 2013-01-12 ENCOUNTER — Encounter (HOSPITAL_COMMUNITY): Payer: 59

## 2013-01-15 ENCOUNTER — Encounter: Payer: Self-pay | Admitting: Thoracic Surgery (Cardiothoracic Vascular Surgery)

## 2013-01-15 ENCOUNTER — Encounter (HOSPITAL_COMMUNITY): Payer: 59

## 2013-01-15 ENCOUNTER — Ambulatory Visit (INDEPENDENT_AMBULATORY_CARE_PROVIDER_SITE_OTHER): Payer: 59 | Admitting: Thoracic Surgery (Cardiothoracic Vascular Surgery)

## 2013-01-15 VITALS — BP 108/77 | HR 82 | Resp 19 | Ht 72.0 in | Wt 231.0 lb

## 2013-01-15 DIAGNOSIS — I059 Rheumatic mitral valve disease, unspecified: Secondary | ICD-10-CM

## 2013-01-15 NOTE — Patient Instructions (Signed)

## 2013-01-15 NOTE — Progress Notes (Signed)
[address]       [name] [address]             [id number]     CARDIOTHORACIC SURGERY OFFICE NOTE  Referring Provider is Kathleene Hazel* MD PCP is Nadean Corwin, MD   HPI:  Patient returns for followup status post minimally invasive mitral valve repair on 08/15/2012. He was last seen here in the office on 10/02/2012.  Since then he has been seen on several occasions by Dr. Clifton James.  For a period time the patient was experiencing some intermittent dizzy spells. He was placed on may 21 day event monitor which showed only normal sinus rhythm with occasional PVCs but no other arrhythmias. The patient's dizzy spells resolve spontaneously. The patient reports that he is doing quite well. His exercise tolerance is quite good. He completed the cardiac rehabilitation program and he has now and rolled in the maintenance program. He denies any shortness of breath. He does have some orthopnea and cough at night. The remainder of his review of systems unremarkable.    Current Outpatient Prescriptions  Medication Sig Dispense Refill  . acetaminophen (TYLENOL) 500 MG tablet Take 500 mg by mouth every 6 (six) hours as needed for pain.      . benazepril (LOTENSIN) 20 MG tablet Take 10 mg by mouth every evening.       . cholecalciferol (VITAMIN D) 1000 UNITS tablet Take 1,000 Units by mouth daily. Takes 5000units daily      . fenofibrate micronized (LOFIBRA) 134 MG capsule Take 134 mg by mouth every evening.       . fexofenadine (ALLEGRA) 30 MG tablet Take 30 mg by mouth daily.      Marland Kitchen guaiFENesin (MUCINEX) 600 MG 12 hr tablet Take 600 mg by mouth 2 (two) times daily as needed for congestion.       Marland Kitchen warfarin (COUMADIN) 5 MG tablet TAKE AS DIRECTED BY ANTICOAGULATION CLINIC  160 tablet  1  . [DISCONTINUED] loratadine (CLARITIN) 10 MG tablet Take 10 mg by mouth daily.         No current facility-administered medications for this visit.      Physical  Exam:   BP 108/77  Pulse 82  Resp 19  Ht 6' (1.829 m)  Wt 231 lb (104.781 kg)  BMI 31.32 kg/m2  SpO2 96%  General:  Well-appearing  Chest:   Clear to auscultation  CV:   Regular rate and rhythm without murmur  Incisions:  Completely healed  Abdomen:  Soft and nontender  Extremities:  Warm and well-perfused  Diagnostic Tests:  Transthoracic Echocardiography  Patient:    Marcus, Griffin MR #:       [id number] Study Date: 09/11/2012 Gender:     M Age:        67 Height:     182.9cm Weight:     96.2kg BSA:        2.27m^2 Pt. Status: Room:    ATTENDING    Cassell Clement  SONOGRAPHER  Aida Raider, RDCS  PERFORMING   [name], [facility]  ORDERING     Gerhardt, Lori  REFERRING    [place], Lori  REFERRING    [place], Christopher cc:  ------------------------------------------------------------ LV EF: 50% -   55%  ------------------------------------------------------------ Indications:      424.0 Mitral valve disease.  ------------------------------------------------------------ History:   PMH:  Acquired from the patient and from the patient's chart.  PMH:  CAD.  Risk  factors:  Former tobacco use. Hypertension.  ------------------------------------------------------------ Study Conclusions  - Left ventricle: Abnormal septal motion The cavity size was   mildly dilated. Systolic function was normal. The   estimated ejection fraction was in the range of 50% to   55%. Wall motion was normal; there were no regional wall   motion abnormalities. - Mitral valve: S/P repair with trivial residual   regurgitation. - Left atrium: The atrium was mildly dilated. - Atrial septum: No defect or patent foramen ovale was   identified.  ------------------------------------------------------------ Labs, prior tests, procedures, and surgery: Status post Mitral Valve Repair-May 2014. Transthoracic echocardiography.  M-mode, complete 2D, spectral Doppler, and color Doppler.   Height:  Height: 182.9cm. Height: 72in.  Weight:  Weight: 96.2kg. Weight: 211.6lb.  Body mass index:  BMI: 28.8kg/m^2.  Body surface area:    BSA: 2.69m^2.  Blood pressure:     116/75.  Patient status:  Outpatient.  Location:  [facility]  ------------------------------------------------------------  ------------------------------------------------------------ Left ventricle:  Abnormal septal motion The cavity size was mildly dilated. Systolic function was normal. The estimated ejection fraction was in the range of 50% to 55%. Wall motion was normal; there were no regional wall motion abnormalities.  ------------------------------------------------------------ Aortic valve:   Structurally normal valve. Trileaflet; normal thickness leaflets. Cusp separation was normal. Mobility was not restricted.  Doppler:  Transvalvular velocity was within the normal range. There was no stenosis.  No regurgitation.  ------------------------------------------------------------ Aorta:  Aortic root: The aortic root was normal in size.  ------------------------------------------------------------ Mitral valve:  S/P repair with trivial residual regurgitation.  Structurally normal valve.   Mobility was not restricted.  Doppler:  Transvalvular velocity was within the normal range. There was no evidence for stenosis.  No regurgitation.    Valve area by pressure half-time: 1.15cm^2. Indexed valve area by pressure half-time: 0.53cm^2/m^2. Valve area by continuity equation (using LVOT flow): 1.14cm^2. Indexed valve area by continuity equation (using LVOT flow): 0.52cm^2/m^2.    Mean gradient: 5mm Hg (D). Peak gradient: 11mm Hg (D).  ------------------------------------------------------------ Left atrium:  The atrium was mildly dilated.  ------------------------------------------------------------ Atrial septum:  No defect or patent foramen ovale  was identified.  ------------------------------------------------------------ Right ventricle:  The cavity size was normal. Wall thickness was normal. Systolic function was normal.  ------------------------------------------------------------ Pulmonic valve:    Doppler:  Transvalvular velocity was within the normal range. There was no evidence for stenosis.  Trivial regurgitation.  ------------------------------------------------------------ Tricuspid valve:   Structurally normal valve.    Doppler: Transvalvular velocity was within the normal range.  Mild regurgitation.  ------------------------------------------------------------ Pulmonary artery:   The main pulmonary artery was normal-sized. Systolic pressure was within the normal range.   ------------------------------------------------------------ Right atrium:  The atrium was normal in size.  ------------------------------------------------------------ Pericardium:  The pericardium was normal in appearance. There was no pericardial effusion.  ------------------------------------------------------------ Systemic veins: Inferior vena cava: The vessel was normal in size.  ------------------------------------------------------------  2D measurements        Normal  Doppler measurements   Normal Left ventricle                 Left ventricle LVID ED,   61.5 mm     43-52   Ea, lat    3.99 cm/s   ------ chord,                         ann, tiss PLAX  DP LVID ES,   46.7 mm     23-38   E/Ea, lat  31.8        ------ chord,                         ann, tiss     3 PLAX                           DP FS, chord,   24 %      >29     Ea, med    5.01 cm/s   ------ PLAX                           ann, tiss LVPW, ED   12.7 mm     ------  DP IVS/LVPW   0.94        <1.3    E/Ea, med  25.3        ------ ratio, ED                      ann, tiss     5 Ventricular septum             DP IVS, ED    11.9 mm     ------   LVOT LVOT                           Peak vel,  91.4 cm/s   ------ Diam, S      23 mm     ------  S Area       4.15 cm^2   ------  VTI, S     19.2 cm     ------ Diam         23 mm     ------  Stroke vol 79.8 ml     ------ Aorta                          Stroke     36.6 ml/m^2 ------ Root diam,   42 mm     ------  index ED                             Mitral valve AAo AP       38 mm     ------  Peak E vel  127 cm/s   ------ diam, S                        Peak A vel  144 cm/s   ------ Left atrium                    Mean vel,  99.1 cm/s   ------ AP dim       51 mm     ------  D AP dim     2.34 cm/m^2 <2.2    Decelerati  486 ms     150-23 index                          on time                0  Pressure    191 ms     ------                                half-time                                Mean          5 mm Hg  ------                                gradient,                                D                                Peak         11 mm Hg  ------                                gradient,                                D                                Peak E/A    0.9        ------                                ratio                                Area (PHT) 1.15 cm^2   ------                                Area index 0.53 cm^2/m ------                                (PHT)           ^2                                Area       1.14 cm^2   ------                                (LVOT)                                continuity                                Area index 0.52 cm^2/m ------                                (  LVOT           ^2                                cont)                                Annulus    69.7 cm     ------                                VTI                                Right ventricle                                Sa vel,    11.8 cm/s   ------                                lat ann,                                tiss DP    ------------------------------------------------------------ Prepared and Electronically Authenticated by  Charlton Haws 2014-06-23T10:24:[id number]    Impression:  Patient is doing well more than 5 months status post name invasive mitral valve repair. Postoperative echocardiogram looks good with normal left ventricular function and intact mitral valve repair. Clinically the patient is doing quite well.   Plan:  In the future the patient will call and return to see [place] as needed. He has been reminded regarding the long-term need for antibiotic prophylaxis for all dental work. All of his questions been addressed.   Salvatore Decent. Cornelius Moras, MD 01/15/2013 1:42 PM

## 2013-01-16 ENCOUNTER — Encounter (HOSPITAL_COMMUNITY)
Admission: RE | Admit: 2013-01-16 | Discharge: 2013-01-16 | Disposition: A | Discharge status: Home / Self Care | Payer: 59 | Source: Ambulatory Visit | Attending: Cardiovascular Disease | Admitting: Cardiovascular Disease

## 2013-01-16 DIAGNOSIS — I2510 Atherosclerotic heart disease of native coronary artery without angina pectoris: Secondary | ICD-10-CM | POA: Insufficient documentation

## 2013-01-16 DIAGNOSIS — I517 Cardiomegaly: Secondary | ICD-10-CM | POA: Insufficient documentation

## 2013-01-16 DIAGNOSIS — Z87891 Personal history of nicotine dependence: Secondary | ICD-10-CM | POA: Diagnosis not present

## 2013-01-16 DIAGNOSIS — Z5189 Encounter for other specified aftercare: Secondary | ICD-10-CM | POA: Diagnosis not present

## 2013-01-16 DIAGNOSIS — Z86711 Personal history of pulmonary embolism: Secondary | ICD-10-CM | POA: Insufficient documentation

## 2013-01-16 DIAGNOSIS — I739 Peripheral vascular disease, unspecified: Secondary | ICD-10-CM | POA: Diagnosis not present

## 2013-01-17 ENCOUNTER — Encounter (HOSPITAL_COMMUNITY)
Admission: RE | Admit: 2013-01-17 | Discharge: 2013-01-17 | Disposition: A | Discharge status: Home / Self Care | Payer: 59 | Source: Ambulatory Visit | Attending: Cardiovascular Disease | Admitting: Cardiovascular Disease

## 2013-01-17 ENCOUNTER — Encounter (HOSPITAL_COMMUNITY): Payer: 59

## 2013-01-18 ENCOUNTER — Encounter (HOSPITAL_COMMUNITY)
Admission: RE | Admit: 2013-01-18 | Discharge: 2013-01-18 | Disposition: A | Discharge status: Home / Self Care | Payer: 59 | Source: Ambulatory Visit | Attending: Cardiovascular Disease | Admitting: Cardiovascular Disease

## 2013-01-19 ENCOUNTER — Encounter (HOSPITAL_COMMUNITY): Payer: 59

## 2013-01-23 ENCOUNTER — Encounter (HOSPITAL_COMMUNITY)
Admission: RE | Admit: 2013-01-23 | Discharge: 2013-01-23 | Disposition: A | Discharge status: Home / Self Care | Payer: Self-pay | Source: Ambulatory Visit | Attending: Cardiovascular Disease | Admitting: Cardiovascular Disease

## 2013-01-23 DIAGNOSIS — E785 Hyperlipidemia, unspecified: Secondary | ICD-10-CM | POA: Insufficient documentation

## 2013-01-23 DIAGNOSIS — Z5189 Encounter for other specified aftercare: Secondary | ICD-10-CM | POA: Insufficient documentation

## 2013-01-23 DIAGNOSIS — I739 Peripheral vascular disease, unspecified: Secondary | ICD-10-CM | POA: Insufficient documentation

## 2013-01-23 DIAGNOSIS — I517 Cardiomegaly: Secondary | ICD-10-CM | POA: Insufficient documentation

## 2013-01-23 DIAGNOSIS — I10 Essential (primary) hypertension: Secondary | ICD-10-CM | POA: Insufficient documentation

## 2013-01-23 DIAGNOSIS — I2510 Atherosclerotic heart disease of native coronary artery without angina pectoris: Secondary | ICD-10-CM | POA: Insufficient documentation

## 2013-01-24 ENCOUNTER — Encounter (HOSPITAL_COMMUNITY)
Admission: RE | Admit: 2013-01-24 | Discharge: 2013-01-24 | Disposition: A | Discharge status: Home / Self Care | Payer: Self-pay | Source: Ambulatory Visit | Attending: Cardiovascular Disease | Admitting: Cardiovascular Disease

## 2013-01-25 ENCOUNTER — Encounter (HOSPITAL_COMMUNITY)
Admission: RE | Admit: 2013-01-25 | Discharge: 2013-01-25 | Disposition: A | Discharge status: Home / Self Care | Payer: Self-pay | Source: Ambulatory Visit | Attending: Cardiovascular Disease | Admitting: Cardiovascular Disease

## 2013-01-30 ENCOUNTER — Encounter (HOSPITAL_COMMUNITY): Payer: Medicare Other

## 2013-01-31 ENCOUNTER — Encounter (HOSPITAL_COMMUNITY): Payer: Medicare Other

## 2013-02-01 ENCOUNTER — Encounter (HOSPITAL_COMMUNITY)
Admission: RE | Admit: 2013-02-01 | Discharge: 2013-02-01 | Disposition: A | Discharge status: Home / Self Care | Payer: Self-pay | Source: Ambulatory Visit | Attending: Cardiovascular Disease | Admitting: Cardiovascular Disease

## 2013-02-05 ENCOUNTER — Ambulatory Visit (INDEPENDENT_AMBULATORY_CARE_PROVIDER_SITE_OTHER): Payer: 59 | Admitting: *Deleted

## 2013-02-05 DIAGNOSIS — I2699 Other pulmonary embolism without acute cor pulmonale: Secondary | ICD-10-CM

## 2013-02-05 DIAGNOSIS — I059 Rheumatic mitral valve disease, unspecified: Secondary | ICD-10-CM

## 2013-02-05 DIAGNOSIS — I340 Nonrheumatic mitral (valve) insufficiency: Secondary | ICD-10-CM

## 2013-02-05 DIAGNOSIS — Z9889 Other specified postprocedural states: Secondary | ICD-10-CM

## 2013-02-05 DIAGNOSIS — Z7901 Long term (current) use of anticoagulants: Secondary | ICD-10-CM

## 2013-02-06 ENCOUNTER — Encounter: Payer: Self-pay | Admitting: Internal Medicine

## 2013-02-06 ENCOUNTER — Encounter (HOSPITAL_COMMUNITY): Payer: Medicare Other

## 2013-02-06 DIAGNOSIS — E1169 Type 2 diabetes mellitus with other specified complication: Secondary | ICD-10-CM | POA: Insufficient documentation

## 2013-02-06 DIAGNOSIS — I10 Essential (primary) hypertension: Secondary | ICD-10-CM | POA: Insufficient documentation

## 2013-02-06 DIAGNOSIS — J302 Other seasonal allergic rhinitis: Secondary | ICD-10-CM | POA: Insufficient documentation

## 2013-02-06 DIAGNOSIS — E559 Vitamin D deficiency, unspecified: Secondary | ICD-10-CM | POA: Insufficient documentation

## 2013-02-06 DIAGNOSIS — M1990 Unspecified osteoarthritis, unspecified site: Secondary | ICD-10-CM | POA: Insufficient documentation

## 2013-02-07 ENCOUNTER — Ambulatory Visit: Payer: 59 | Admitting: Emergency Medicine

## 2013-02-07 ENCOUNTER — Encounter (HOSPITAL_COMMUNITY): Payer: Medicare Other

## 2013-02-07 ENCOUNTER — Encounter: Payer: Self-pay | Admitting: Emergency Medicine

## 2013-02-07 VITALS — BP 114/82 | HR 84 | Temp 98.0°F | Resp 18 | Ht 72.0 in | Wt 233.0 lb

## 2013-02-07 DIAGNOSIS — J309 Allergic rhinitis, unspecified: Secondary | ICD-10-CM

## 2013-02-07 DIAGNOSIS — I10 Essential (primary) hypertension: Secondary | ICD-10-CM

## 2013-02-07 DIAGNOSIS — E782 Mixed hyperlipidemia: Secondary | ICD-10-CM

## 2013-02-07 DIAGNOSIS — R7309 Other abnormal glucose: Secondary | ICD-10-CM

## 2013-02-07 LAB — CBC WITH DIFFERENTIAL/PLATELET
Basophils Absolute: 0.1 10*3/uL (ref 0.0–0.1)
Basophils Relative: 2 % — ABNORMAL HIGH (ref 0–1)
Eosinophils Absolute: 0.2 10*3/uL (ref 0.0–0.7)
Hemoglobin: 15.2 g/dL (ref 13.0–17.0)
Lymphocytes Relative: 45 % (ref 12–46)
MCH: 30 pg (ref 26.0–34.0)
MCHC: 34.8 g/dL (ref 30.0–36.0)
Monocytes Relative: 8 % (ref 3–12)
Neutro Abs: 2 10*3/uL (ref 1.7–7.7)
Neutrophils Relative %: 42 % — ABNORMAL LOW (ref 43–77)
RDW: 14.9 % (ref 11.5–15.5)
WBC: 4.7 10*3/uL (ref 4.0–10.5)

## 2013-02-07 LAB — LIPID PANEL
Cholesterol: 204 mg/dL — ABNORMAL HIGH (ref 0–200)
LDL Cholesterol: 139 mg/dL — ABNORMAL HIGH (ref 0–99)
Total CHOL/HDL Ratio: 4.6 Ratio
Triglycerides: 106 mg/dL (ref <150)
VLDL: 21 mg/dL (ref 0–40)

## 2013-02-07 LAB — HEPATIC FUNCTION PANEL
ALT: 23 U/L (ref 0–53)
AST: 33 U/L (ref 0–37)
Alkaline Phosphatase: 41 U/L (ref 39–117)
Bilirubin, Direct: 0.1 mg/dL (ref 0.0–0.3)
Indirect Bilirubin: 0.4 mg/dL (ref 0.0–0.9)
Total Bilirubin: 0.5 mg/dL (ref 0.3–1.2)
Total Protein: 7.5 g/dL (ref 6.0–8.3)

## 2013-02-07 LAB — BASIC METABOLIC PANEL WITH GFR
BUN: 19 mg/dL (ref 6–23)
CO2: 26 mEq/L (ref 19–32)
Chloride: 102 mEq/L (ref 96–112)
Creat: 1.56 mg/dL — ABNORMAL HIGH (ref 0.50–1.35)
GFR, Est African American: 53 mL/min — ABNORMAL LOW
Glucose, Bld: 92 mg/dL (ref 70–99)
Sodium: 137 mEq/L (ref 135–145)

## 2013-02-07 LAB — HEMOGLOBIN A1C
Hgb A1c MFr Bld: 6.5 % — ABNORMAL HIGH (ref <5.7)
Mean Plasma Glucose: 140 mg/dL — ABNORMAL HIGH (ref <117)

## 2013-02-07 NOTE — Patient Instructions (Signed)
Diabetes and Exercise Exercising regularly is important. It is not just about losing weight. It has many health benefits, such as:  Improving your overall fitness, flexibility, and endurance.  Increasing your bone density.  Helping with weight control.  Decreasing your body fat.  Increasing your muscle strength.  Reducing stress and tension.  Improving your overall health. People with diabetes who exercise gain additional benefits because exercise:  Reduces appetite.  Improves the body's use of blood sugar (glucose).  Helps lower or control blood glucose.  Decreases blood pressure.  Helps control blood lipids (such as cholesterol and triglycerides).  Improves the body's use of the hormone insulin by:  Increasing the body's insulin sensitivity.  Reducing the body's insulin needs.  Decreases the risk for heart disease because exercising:  Lowers cholesterol and triglycerides levels.  Increases the levels of good cholesterol (such as high-density lipoproteins [HDL]) in the body.  Lowers blood glucose levels. YOUR ACTIVITY PLAN  Choose an activity that you enjoy and set realistic goals. Your health care provider or diabetes educator can help you make an activity plan that works for you. You can break activities into 2 or 3 sessions throughout the day. Doing so is as good as one long session. Exercise ideas include:  Taking the dog for a walk.  Taking the stairs instead of the elevator.  Dancing to your favorite song.  Doing your favorite exercise with a friend. RECOMMENDATIONS FOR EXERCISING WITH TYPE 1 OR TYPE 2 DIABETES   Check your blood glucose before exercising. If blood glucose levels are greater than 240 mg/dL, check for urine ketones. Do not exercise if ketones are present.  Avoid injecting insulin into areas of the body that are going to be exercised. For example, avoid injecting insulin into:  The arms when playing tennis.  The legs when  jogging.  Keep a record of:  Food intake before and after you exercise.  Expected peak times of insulin action.  Blood glucose levels before and after you exercise.  The type and amount of exercise you have done.  Review your records with your health care provider. Your health care provider will help you to develop guidelines for adjusting food intake and insulin amounts before and after exercising.  If you take insulin or oral hypoglycemic agents, watch for signs and symptoms of hypoglycemia. They include:  Dizziness.  Shaking.  Sweating.  Chills.  Confusion.  Drink plenty of water while you exercise to prevent dehydration or heat stroke. Body water is lost during exercise and must be replaced.  Talk to your health care provider before starting an exercise program to make sure it is safe for you. Remember, almost any type of activity is better than none. Document Released: 05/29/2003 Document Revised: 11/08/2012 Document Reviewed: 08/15/2012 [facility] Patient Information 2014 [place], [place]. Fat and Cholesterol Control Diet Fat and cholesterol levels in your blood and organs are influenced by your diet. High levels of fat and cholesterol may lead to diseases of the heart, small and large blood vessels, gallbladder, liver, and pancreas. CONTROLLING FAT AND CHOLESTEROL WITH DIET Although exercise and lifestyle factors are important, your diet is key. That is because certain foods are known to raise cholesterol and others to lower it. The goal is to balance foods for their effect on cholesterol and more importantly, to replace saturated and trans fat with other types of fat, such as monounsaturated fat, polyunsaturated fat, and omega-3 fatty acids. On average, a person should consume no more than 15 to  17 g of saturated fat daily. Saturated and trans fats are considered "bad" fats, and they will raise LDL cholesterol. Saturated fats are primarily found in animal products such as  meats, butter, and cream. However, that does not mean you need to give up all your favorite foods. Today, there are good tasting, low-fat, low-cholesterol substitutes for most of the things you like to eat. Choose low-fat or nonfat alternatives. Choose round or loin cuts of red meat. These types of cuts are lowest in fat and cholesterol. Chicken (without the skin), fish, veal, and ground [place] breast are great choices. Eliminate fatty meats, such as hot dogs and salami. Even shellfish have little or no saturated fat. Have a 3 oz (85 g) portion when you eat lean meat, poultry, or fish. Trans fats are also called "partially hydrogenated oils." They are oils that have been scientifically manipulated so that they are solid at room temperature resulting in a longer shelf life and improved taste and texture of foods in which they are added. Trans fats are found in stick margarine, some tub margarines, cookies, crackers, and baked goods.  When baking and cooking, oils are a great substitute for butter. The monounsaturated oils are especially beneficial since it is believed they lower LDL and raise HDL. The oils you should avoid entirely are saturated tropical oils, such as coconut and palm.  Remember to eat a lot from food groups that are naturally free of saturated and trans fat, including fish, fruit, vegetables, beans, grains (barley, rice, couscous, bulgur wheat), and pasta (without cream sauces).  IDENTIFYING FOODS THAT LOWER FAT AND CHOLESTEROL  Soluble fiber may lower your cholesterol. This type of fiber is found in fruits such as apples, vegetables such as broccoli, potatoes, and carrots, legumes such as beans, peas, and lentils, and grains such as barley. Foods fortified with plant sterols (phytosterol) may also lower cholesterol. You should eat at least 2 g per day of these foods for a cholesterol lowering effect.  Read package labels to identify low-saturated fats, trans fat free, and low-fat foods at the  supermarket. Select cheeses that have only 2 to 3 g saturated fat per ounce. Use a heart-healthy tub margarine that is free of trans fats or partially hydrogenated oil. When buying baked goods (cookies, crackers), avoid partially hydrogenated oils. Breads and muffins should be made from whole grains (whole-wheat or whole oat flour, instead of "flour" or "enriched flour"). Buy non-creamy canned soups with reduced salt and no added fats.  FOOD PREPARATION TECHNIQUES  Never deep-fry. If you must fry, either stir-fry, which uses very little fat, or use non-stick cooking sprays. When possible, broil, bake, or roast meats, and steam vegetables. Instead of putting butter or margarine on vegetables, use lemon and herbs, applesauce, and cinnamon (for squash and sweet potatoes). Use nonfat yogurt, salsa, and low-fat dressings for salads.  LOW-SATURATED FAT / LOW-FAT FOOD SUBSTITUTES Meats / Saturated Fat (g)  Avoid: Steak, marbled (3 oz/85 g) / 11 g  Choose: Steak, lean (3 oz/85 g) / 4 g  Avoid: Hamburger (3 oz/85 g) / 7 g  Choose: Hamburger, lean (3 oz/85 g) / 5 g  Avoid: Ham (3 oz/85 g) / 6 g  Choose: Ham, lean cut (3 oz/85 g) / 2.4 g  Avoid: Chicken, with skin, dark meat (3 oz/85 g) / 4 g  Choose: Chicken, skin removed, dark meat (3 oz/85 g) / 2 g  Avoid: Chicken, with skin, light meat (3 oz/85 g) / 2.5 g  Choose: Chicken, skin removed, light meat (3 oz/85 g) / 1 g Dairy / Saturated Fat (g)  Avoid: Whole milk (1 cup) / 5 g  Choose: Low-fat milk, 2% (1 cup) / 3 g  Choose: Low-fat milk, 1% (1 cup) / 1.5 g  Choose: Skim milk (1 cup) / 0.3 g  Avoid: Hard cheese (1 oz/28 g) / 6 g  Choose: Skim milk cheese (1 oz/28 g) / 2 to 3 g  Avoid: Cottage cheese, 4% fat (1 cup) / 6.5 g  Choose: Low-fat cottage cheese, 1% fat (1 cup) / 1.5 g  Avoid: Ice cream (1 cup) / 9 g  Choose: Sherbet (1 cup) / 2.5 g  Choose: Nonfat frozen yogurt (1 cup) / 0.3 g  Choose: Frozen fruit bar /  trace  Avoid: Whipped cream (1 tbs) / 3.5 g  Choose: Nondairy whipped topping (1 tbs) / 1 g Condiments / Saturated Fat (g)  Avoid: Mayonnaise (1 tbs) / 2 g  Choose: Low-fat mayonnaise (1 tbs) / 1 g  Avoid: Butter (1 tbs) / 7 g  Choose: Extra light margarine (1 tbs) / 1 g  Avoid: Coconut oil (1 tbs) / 11.8 g  Choose: Olive oil (1 tbs) / 1.8 g  Choose: Corn oil (1 tbs) / 1.7 g  Choose: Safflower oil (1 tbs) / 1.2 g  Choose: Sunflower oil (1 tbs) / 1.4 g  Choose: Soybean oil (1 tbs) / 2.4 g  Choose: Canola oil (1 tbs) / 1 g Document Released: 03/08/2005 Document Revised: 07/03/2012 Document Reviewed: 08/27/2010 [employer] Patient Information 2014 [place], [place].

## 2013-02-08 ENCOUNTER — Encounter (HOSPITAL_COMMUNITY): Payer: Medicare Other

## 2013-02-09 NOTE — Progress Notes (Signed)
Subjective:    Patient ID: [name], male    DOB: Aug 17, 1945, 67 y.o.   MRN: [id number]  HPI Comments: 67 yo male presents for 3 month F/U for HTN, Cholesterol, Pre-Dm, D. Deficient. He has been exercising sporadically, except for cardiac rehab 3 times a week.Marland KitchenHe is eating descent except for fried chicken. His BP at rehab has been good. He has had several episodes of syncope over last several months. He has had a neg work up this far at cardiologist. He denies tilt test. He notes episodes can occur while at rest or with exertion. He denies any increased stress or triggers for episodes. HE NOTES FATIGUE FOR SEVERAL MINUTES AFTER EPISODES.  He does not have an aura predisposing episode.  LAST LABS T 185 TG 137 H 44 L 114 A1C 6.1 INSUL 141   Hypertension  Hyperlipidemia    Current Outpatient Prescriptions on File Prior to Visit  Medication Sig Dispense Refill  . acetaminophen (TYLENOL) 500 MG tablet Take 500 mg by mouth every 6 (six) hours as needed for pain.      . benazepril (LOTENSIN) 20 MG tablet Take 10 mg by mouth every evening.       . cholecalciferol (VITAMIN D) 1000 UNITS tablet Take 1,000 Units by mouth daily. Takes 5000units daily      . fenofibrate micronized (LOFIBRA) 134 MG capsule Take 134 mg by mouth every evening.       . fexofenadine (ALLEGRA) 30 MG tablet Take 30 mg by mouth daily.      Marland Kitchen guaiFENesin (MUCINEX) 600 MG 12 hr tablet Take 600 mg by mouth 2 (two) times daily as needed for congestion.       Marland Kitchen warfarin (COUMADIN) 5 MG tablet TAKE AS DIRECTED BY ANTICOAGULATION CLINIC  160 tablet  1   No current facility-administered medications on file prior to visit.   ALLERGIES Statins and Welchol  Past Medical History  Diagnosis Date  . Pulmonary embolism     march 2010, 2012              /   DVT x  2  LEFT 2010  . Coronary artery disease     non-obstructive by cath 2009  . Pre-diabetes   . Peripheral vascular disease   . Severe mitral regurgitation by prior  echocardiogram   . Sleep apnea     STOP BANG SCORE 4  . S/P mitral valve repair 08/15/2012    Complex valvuloplasty including triangular resection of posterior leaflet, artificial Gore-tex neocord placement x4 and Sorin Memo 3D ring annuloplasty via right mini thoracotomy approach  . Hyperlipidemia   . Hypertension     Clearance with note Dr Marvel Plan on chart,  Lovonox bridging  ordered by Dr Sanjuana Kava    EKG 9/12, chst 9/12 EPIC  . Heart murmur   . Seasonal allergies   . Osteoarthritis   . Vitamin D deficiency   . Degenerative joint disease      Review of Systems  Constitutional: Positive for fatigue.  Neurological: Positive for syncope.  All other systems reviewed and are negative.    BP 114/82  Pulse 84  Temp(Src) 98 F (36.7 C) (Temporal)  Resp 18  Ht 6' (1.829 m)  Wt 233 lb (105.688 kg)  BMI 31.59 kg/m2     Objective:   Physical Exam  Nursing note and vitals reviewed. Constitutional: He is oriented to person, place, and time. He appears well-developed and well-nourished.  HENT:  Head: Normocephalic and  atraumatic.  Right Ear: External ear normal.  Left Ear: External ear normal.  Nose: Nose normal.  Mouth/Throat: Oropharynx is clear and moist.  Eyes: Pupils are equal, round, and reactive to light.  Neck: Normal range of motion. Neck supple. No JVD present. No tracheal deviation present. No thyromegaly present.  Cardiovascular: Normal rate, regular rhythm, normal heart sounds and intact distal pulses.   Pulmonary/Chest: Effort normal and breath sounds normal.  Abdominal: Soft. Bowel sounds are normal. He exhibits no distension and no mass. There is no tenderness. There is no rebound.  Musculoskeletal: Normal range of motion.  Lymphadenopathy:    He has no cervical adenopathy.  Neurological: He is alert and oriented to person, place, and time. No cranial nerve deficit.  Skin: Skin is warm and dry. No rash noted. No erythema. No pallor.  Psychiatric: He has a  normal mood and affect. Judgment normal.          Assessment & Plan:  1. 3 month F/U for HTN, Cholesterol, Pre-Dm, D. Deficient check labs, continue to improve diet, exercise and wt loss 2. Syncope- review records from cardiology, recommend he call for f/u to see if TilT test would be next step in workup. Advise to make sure eating protein, increasing, H2O. Do not drive if episodes are increasing.

## 2013-02-13 ENCOUNTER — Encounter (HOSPITAL_COMMUNITY): Payer: Medicare Other

## 2013-02-14 ENCOUNTER — Encounter (HOSPITAL_COMMUNITY): Payer: Medicare Other

## 2013-02-20 ENCOUNTER — Encounter (HOSPITAL_COMMUNITY): Payer: 59

## 2013-02-21 ENCOUNTER — Encounter (HOSPITAL_COMMUNITY): Payer: 59

## 2013-02-22 ENCOUNTER — Encounter (HOSPITAL_COMMUNITY): Payer: 59

## 2013-02-26 ENCOUNTER — Telehealth: Payer: Self-pay | Admitting: Cardiovascular Disease

## 2013-02-26 NOTE — Telephone Encounter (Signed)
Pat, I would not recommend a tilt table test at this time. If he has further episodes of dizziness, near syncope he should call our office for evaluation. chris

## 2013-02-26 NOTE — Telephone Encounter (Signed)
Spoke with pt who reports he saw primary provider recently and was told to check with Dr. Clifton James about possibly having a tilt test.  (see office visit note with Loree Fee, PA dated 11/19). Pt reports he had one episode of dizziness about a week prior to seeing Loree Fee, PA.  This occurred when he got out of the shower. Felt like things were spinning around. He sat on side of bed and dizziness went away. Lasted about 1 minute. Has not had any episodes since. Does feel like head is stuffy frequently. Occasional nasal drainage. Is taking Allegra which is helping.

## 2013-02-26 NOTE — Telephone Encounter (Signed)
Spoke with pt and gave him the information from Dr. Clifton James

## 2013-02-26 NOTE — Telephone Encounter (Signed)
New Message  Pt states that he seen his PCP recently who recommends a tilt test for the pt// Pt has had a black out spell and wanted to keep Dr. Clifton James informed/// Please advise

## 2013-02-27 ENCOUNTER — Encounter (HOSPITAL_COMMUNITY): Payer: 59

## 2013-02-28 ENCOUNTER — Encounter (HOSPITAL_COMMUNITY): Payer: 59

## 2013-03-01 ENCOUNTER — Encounter (HOSPITAL_COMMUNITY): Payer: 59

## 2013-03-06 ENCOUNTER — Encounter (HOSPITAL_COMMUNITY): Payer: 59

## 2013-03-07 ENCOUNTER — Encounter (HOSPITAL_COMMUNITY): Payer: 59

## 2013-03-08 ENCOUNTER — Encounter (HOSPITAL_COMMUNITY): Payer: 59

## 2013-03-13 ENCOUNTER — Encounter (HOSPITAL_COMMUNITY): Payer: 59

## 2013-03-14 ENCOUNTER — Encounter (HOSPITAL_COMMUNITY): Payer: 59

## 2013-03-19 ENCOUNTER — Ambulatory Visit (INDEPENDENT_AMBULATORY_CARE_PROVIDER_SITE_OTHER): Payer: 59

## 2013-03-19 DIAGNOSIS — Z9889 Other specified postprocedural states: Secondary | ICD-10-CM

## 2013-03-19 DIAGNOSIS — I2699 Other pulmonary embolism without acute cor pulmonale: Secondary | ICD-10-CM | POA: Diagnosis not present

## 2013-03-19 DIAGNOSIS — Z7901 Long term (current) use of anticoagulants: Secondary | ICD-10-CM

## 2013-03-19 DIAGNOSIS — I340 Nonrheumatic mitral (valve) insufficiency: Secondary | ICD-10-CM

## 2013-03-19 DIAGNOSIS — I059 Rheumatic mitral valve disease, unspecified: Secondary | ICD-10-CM

## 2013-03-19 LAB — POCT INR: INR: 2.4

## 2013-03-20 ENCOUNTER — Encounter (HOSPITAL_COMMUNITY): Payer: 59

## 2013-03-21 ENCOUNTER — Encounter (HOSPITAL_COMMUNITY): Payer: 59

## 2013-03-27 ENCOUNTER — Encounter (HOSPITAL_COMMUNITY): Payer: 59

## 2013-03-28 ENCOUNTER — Encounter (HOSPITAL_COMMUNITY): Payer: 59

## 2013-03-29 ENCOUNTER — Ambulatory Visit (INDEPENDENT_AMBULATORY_CARE_PROVIDER_SITE_OTHER): Payer: 59 | Admitting: Emergency Medicine

## 2013-03-29 ENCOUNTER — Encounter: Payer: Self-pay | Admitting: Emergency Medicine

## 2013-03-29 ENCOUNTER — Encounter (HOSPITAL_COMMUNITY): Payer: 59

## 2013-03-29 VITALS — BP 132/84 | HR 90 | Temp 100.2°F | Resp 20 | Ht 72.0 in | Wt 235.0 lb

## 2013-03-29 DIAGNOSIS — B349 Viral infection, unspecified: Secondary | ICD-10-CM

## 2013-03-29 DIAGNOSIS — B9789 Other viral agents as the cause of diseases classified elsewhere: Secondary | ICD-10-CM

## 2013-03-29 DIAGNOSIS — R509 Fever, unspecified: Secondary | ICD-10-CM

## 2013-03-29 MED ORDER — AZITHROMYCIN 250 MG PO TABS: ORAL_TABLET | ORAL | Status: AC

## 2013-03-29 MED ORDER — PREDNISONE 10 MG PO TABS: ORAL_TABLET | ORAL | Status: DC

## 2013-03-29 NOTE — Progress Notes (Signed)
   Subjective:    Patient ID: [name], male    DOB: Jul 31, 1945, 68 y.o.   MRN: [id number]  HPI Comments: 68 yo male with sudden fever, aches, and fatigue. He has nonproductive cough and denies production from head. He has not tried any OTC.   Current Outpatient Prescriptions on File Prior to Visit  Medication Sig Dispense Refill  . acetaminophen (TYLENOL) 500 MG tablet Take 500 mg by mouth every 6 (six) hours as needed for pain.      . benazepril (LOTENSIN) 20 MG tablet Take 10 mg by mouth every evening.       . cholecalciferol (VITAMIN D) 1000 UNITS tablet Take 1,000 Units by mouth daily. Takes 5000units daily      . fenofibrate micronized (LOFIBRA) 134 MG capsule Take 134 mg by mouth every evening.       . fexofenadine (ALLEGRA) 30 MG tablet Take 30 mg by mouth daily.      Marland Kitchen guaiFENesin (MUCINEX) 600 MG 12 hr tablet Take 600 mg by mouth 2 (two) times daily as needed for congestion.       Marland Kitchen loratadine (CLARITIN) 10 MG tablet Take 10 mg by mouth daily as needed for allergies.      Marland Kitchen warfarin (COUMADIN) 5 MG tablet TAKE AS DIRECTED BY ANTICOAGULATION CLINIC  160 tablet  1   No current facility-administered medications on file prior to visit.   ALLERGIES Statins and Welchol    Review of Systems  Constitutional: Positive for fever and fatigue.  Respiratory: Positive for cough.   Musculoskeletal: Positive for arthralgias and myalgias.  All other systems reviewed and are negative.   BP 132/84  Pulse 90  Temp(Src) 100.2 F (37.9 C) (Oral)  Resp 20  Ht 6' (1.829 m)  Wt 235 lb (106.595 kg)  BMI 31.86 kg/m2     Objective:   Physical Exam  Nursing note and vitals reviewed. Constitutional: He is oriented to person, place, and time. He appears well-developed and well-nourished.  HENT:  Head: Normocephalic and atraumatic.  Right Ear: External ear normal.  Left Ear: External ear normal.  Nose: Nose normal.  Mouth/Throat: Oropharynx is clear and moist. No oropharyngeal exudate.   Eyes: Conjunctivae are normal.  Neck: Normal range of motion.  Cardiovascular: Normal rate, regular rhythm, normal heart sounds and intact distal pulses.   Pulmonary/Chest: Effort normal and breath sounds normal.  Abdominal: Soft. He exhibits no distension. There is no tenderness.  Musculoskeletal: Normal range of motion.  Lymphadenopathy:    He has no cervical adenopathy.  Neurological: He is alert and oriented to person, place, and time.  Skin: Skin is warm and dry.  Psychiatric: He has a normal mood and affect. Judgment normal.      FLU NEG    Assessment & Plan:  Probable viral illness- Push fluids, Pred DP 10 mg AD if Sx increase Zpak AD. ER if severe increase. W/C if any Bruising or bleeding.

## 2013-03-29 NOTE — Patient Instructions (Signed)

## 2013-04-03 ENCOUNTER — Encounter (HOSPITAL_COMMUNITY): Payer: 59

## 2013-04-04 ENCOUNTER — Encounter (HOSPITAL_COMMUNITY): Payer: 59

## 2013-04-05 ENCOUNTER — Encounter (HOSPITAL_COMMUNITY): Payer: 59

## 2013-04-10 ENCOUNTER — Encounter (HOSPITAL_COMMUNITY): Payer: 59

## 2013-04-11 ENCOUNTER — Encounter (HOSPITAL_COMMUNITY): Payer: 59

## 2013-04-12 ENCOUNTER — Encounter (HOSPITAL_COMMUNITY): Payer: 59

## 2013-04-17 ENCOUNTER — Encounter (HOSPITAL_COMMUNITY): Payer: 59

## 2013-04-18 ENCOUNTER — Encounter (HOSPITAL_COMMUNITY): Payer: 59

## 2013-04-19 ENCOUNTER — Encounter (HOSPITAL_COMMUNITY): Payer: 59

## 2013-04-24 ENCOUNTER — Encounter (HOSPITAL_COMMUNITY): Payer: 59

## 2013-04-25 ENCOUNTER — Encounter (HOSPITAL_COMMUNITY): Payer: 59

## 2013-04-26 ENCOUNTER — Encounter (HOSPITAL_COMMUNITY): Payer: 59

## 2013-04-30 ENCOUNTER — Ambulatory Visit (INDEPENDENT_AMBULATORY_CARE_PROVIDER_SITE_OTHER): Payer: 59 | Admitting: *Deleted

## 2013-04-30 DIAGNOSIS — Z5181 Encounter for therapeutic drug level monitoring: Secondary | ICD-10-CM

## 2013-04-30 DIAGNOSIS — I059 Rheumatic mitral valve disease, unspecified: Secondary | ICD-10-CM

## 2013-04-30 DIAGNOSIS — I2699 Other pulmonary embolism without acute cor pulmonale: Secondary | ICD-10-CM | POA: Diagnosis not present

## 2013-04-30 DIAGNOSIS — I340 Nonrheumatic mitral (valve) insufficiency: Secondary | ICD-10-CM

## 2013-04-30 DIAGNOSIS — Z7901 Long term (current) use of anticoagulants: Secondary | ICD-10-CM

## 2013-04-30 DIAGNOSIS — Z9889 Other specified postprocedural states: Secondary | ICD-10-CM

## 2013-04-30 LAB — POCT INR: INR: 2.9

## 2013-05-01 ENCOUNTER — Encounter (HOSPITAL_COMMUNITY): Payer: 59

## 2013-05-02 ENCOUNTER — Encounter (HOSPITAL_COMMUNITY): Payer: 59

## 2013-05-03 ENCOUNTER — Encounter (HOSPITAL_COMMUNITY): Payer: 59

## 2013-05-08 ENCOUNTER — Encounter (HOSPITAL_COMMUNITY): Payer: 59

## 2013-05-09 ENCOUNTER — Encounter (HOSPITAL_COMMUNITY): Payer: 59

## 2013-05-10 ENCOUNTER — Encounter (HOSPITAL_COMMUNITY): Payer: 59

## 2013-05-15 ENCOUNTER — Encounter (HOSPITAL_COMMUNITY): Payer: 59

## 2013-05-16 ENCOUNTER — Encounter (HOSPITAL_COMMUNITY): Payer: 59

## 2013-05-17 ENCOUNTER — Encounter (HOSPITAL_COMMUNITY): Payer: 59

## 2013-05-17 ENCOUNTER — Encounter: Payer: Self-pay | Admitting: Internal Medicine

## 2013-05-22 ENCOUNTER — Encounter (HOSPITAL_COMMUNITY): Payer: 59

## 2013-05-23 ENCOUNTER — Encounter (HOSPITAL_COMMUNITY): Payer: 59

## 2013-05-24 ENCOUNTER — Encounter: Payer: Self-pay | Admitting: Internal Medicine

## 2013-05-24 ENCOUNTER — Encounter (HOSPITAL_COMMUNITY): Payer: 59

## 2013-05-24 ENCOUNTER — Ambulatory Visit (INDEPENDENT_AMBULATORY_CARE_PROVIDER_SITE_OTHER): Payer: 59 | Admitting: Internal Medicine

## 2013-05-24 VITALS — BP 120/84 | HR 76 | Temp 97.9°F | Resp 16 | Ht 72.5 in | Wt 236.2 lb

## 2013-05-24 DIAGNOSIS — N183 Chronic kidney disease, stage 3 unspecified: Secondary | ICD-10-CM | POA: Insufficient documentation

## 2013-05-24 DIAGNOSIS — R7303 Prediabetes: Secondary | ICD-10-CM

## 2013-05-24 DIAGNOSIS — Z1331 Encounter for screening for depression: Secondary | ICD-10-CM

## 2013-05-24 DIAGNOSIS — E1122 Type 2 diabetes mellitus with diabetic chronic kidney disease: Secondary | ICD-10-CM | POA: Insufficient documentation

## 2013-05-24 DIAGNOSIS — E559 Vitamin D deficiency, unspecified: Secondary | ICD-10-CM

## 2013-05-24 DIAGNOSIS — Z79899 Other long term (current) drug therapy: Secondary | ICD-10-CM

## 2013-05-24 DIAGNOSIS — I10 Essential (primary) hypertension: Secondary | ICD-10-CM

## 2013-05-24 DIAGNOSIS — Z125 Encounter for screening for malignant neoplasm of prostate: Secondary | ICD-10-CM

## 2013-05-24 DIAGNOSIS — Z1212 Encounter for screening for malignant neoplasm of rectum: Secondary | ICD-10-CM

## 2013-05-24 DIAGNOSIS — E1129 Type 2 diabetes mellitus with other diabetic kidney complication: Secondary | ICD-10-CM

## 2013-05-24 DIAGNOSIS — E785 Hyperlipidemia, unspecified: Secondary | ICD-10-CM

## 2013-05-24 DIAGNOSIS — Z0000 Encounter for general adult medical examination without abnormal findings: Secondary | ICD-10-CM

## 2013-05-24 DIAGNOSIS — Z9181 History of falling: Secondary | ICD-10-CM

## 2013-05-24 LAB — CBC WITH DIFFERENTIAL/PLATELET
Basophils Absolute: 0 10*3/uL (ref 0.0–0.1)
Basophils Relative: 1 % (ref 0–1)
Eosinophils Absolute: 0.1 10*3/uL (ref 0.0–0.7)
Eosinophils Relative: 3 % (ref 0–5)
HCT: 44.4 % (ref 39.0–52.0)
Hemoglobin: 15.6 g/dL (ref 13.0–17.0)
LYMPHS PCT: 47 % — AB (ref 12–46)
Lymphs Abs: 1.9 10*3/uL (ref 0.7–4.0)
MCH: 31.1 pg (ref 26.0–34.0)
MCHC: 35.1 g/dL (ref 30.0–36.0)
MCV: 88.4 fL (ref 78.0–100.0)
Monocytes Absolute: 0.3 10*3/uL (ref 0.1–1.0)
Monocytes Relative: 8 % (ref 3–12)
NEUTROS PCT: 41 % — AB (ref 43–77)
Neutro Abs: 1.6 10*3/uL — ABNORMAL LOW (ref 1.7–7.7)
PLATELETS: 244 10*3/uL (ref 150–400)
RBC: 5.02 MIL/uL (ref 4.22–5.81)
RDW: 14.6 % (ref 11.5–15.5)
WBC: 4 10*3/uL (ref 4.0–10.5)

## 2013-05-24 MED ORDER — HYDROCORTISONE 2.5 % RE CREA: TOPICAL_CREAM | RECTAL | Status: DC

## 2013-05-24 NOTE — Patient Instructions (Signed)

## 2013-05-24 NOTE — Progress Notes (Signed)
Patient ID: [name], male   DOB: [birth year], 68 y.o.   MRN: [id number]   Annual Screening Comprehensive Examination  This very nice 68 y.o.  male presents for complete physical.  Patient has been followed for HTN, T2 NIDDM, Hyperlipidemia, and Vitamin D Deficiency.   HTN predates since 2005. Patient's BP has been controlled at home.Today's BP: 120/84 mmHg. Patient is S/P Mitral Valve Repair (May 2014) by Dr Barry Dienes and also has Hx/o Recurrent Pulm emboli  has been on coumadin since followed at the [facility]. Also he is followed by Dr Sanjuana Kava.  Patient denies any cardiac symptoms as chest pain, palpitations, shortness of breath, dizziness or ankle swelling.   Patient's hyperlipidemia is controlled with diet and medications. Patient denies myalgias or other medication SE's. Last  Lipids as below show LDL not at goal. Patient is intolerant at all statins and Welchol.  Lab Results  Component Value Date   CHOL 204* 02/07/2013   HDL 44 02/07/2013   LDLCALC 139* 02/07/2013   TRIG 106 02/07/2013   CHOLHDL 4.6 02/07/2013    Patient has T2 NIDDM predating since 2002 and is managed with diet and last A1c was  6.5% in Nov 2014. Patient denies reactive hypoglycemic symptoms, visual blurring, diabetic polys, or paresthesias.    Finally, patient has history of Vitamin D Deficiency of 30 in 2013 with last vitamin D 45 in July 2014.    Medication List         acetaminophen 500 MG tablet  Commonly known as:  TYLENOL  Take 500 mg by mouth every 6 (six) hours as needed for pain.     benazepril 20 MG tablet  Commonly known as:  LOTENSIN  Take 10 mg by mouth every evening.     fenofibrate micronized 134 MG capsule  Commonly known as:  LOFIBRA  Take 134 mg by mouth every evening.     fexofenadine 30 MG tablet  Commonly known as:  ALLEGRA  Take 30 mg by mouth daily.     Flaxseed Oil 1000 MG Caps  Take by mouth daily.     hydrocortisone 2.5 % rectal cream  Commonly known as:   ANUSOL-HC  Apply to hemorrhoids 4 x daily as needed     loratadine 10 MG tablet  Commonly known as:  CLARITIN  Take 10 mg by mouth daily as needed for allergies.     VITAMIN D PO  Take 5,000 Units by mouth daily.     warfarin 5 MG tablet  Commonly known as:  COUMADIN  TAKE AS DIRECTED BY ANTICOAGULATION CLINIC       Allergies  Allergen Reactions  . Statins     REACTION: muscle aches  . Welchol [Colesevelam Hcl]     Past Medical History  Diagnosis Date  . Pulmonary embolism     march 2010, 2012              /   DVT x  2  LEFT 2010  . Coronary artery disease     non-obstructive by cath 2009  . Pre-diabetes   . Peripheral vascular disease   . Severe mitral regurgitation by prior echocardiogram   . Sleep apnea     STOP BANG SCORE 4  . S/P mitral valve repair 08/15/2012    Complex valvuloplasty including triangular resection of posterior leaflet, artificial Gore-tex neocord placement x4 and Sorin Memo 3D ring annuloplasty via right mini thoracotomy approach  . Hyperlipidemia   . Hypertension  Clearance with note Dr Marvel PlanMcGowan on chart,  Lovonox bridging  ordered by Dr Sanjuana KavaMcalhaney    EKG 9/12, chst 9/12 EPIC  . Heart murmur   . Seasonal allergies   . Osteoarthritis   . Vitamin D deficiency   . Degenerative joint disease     Past Surgical History  Procedure Laterality Date  . Achillis tendon      repair 20 years ago  . Hip arthroplasty      11/06/07  . Total hip arthroplasty  08/24/2011    Procedure: TOTAL HIP ARTHROPLASTY ANTERIOR APPROACH;  Surgeon: Shelda PalMatthew D Olin, MD;  Location: [facility];  Service: Orthopedics;  Laterality: Right;  . Tee without cardioversion N/A 07/04/2012    Procedure: TRANSESOPHAGEAL ECHOCARDIOGRAM (TEE);  Surgeon: Laurey Moralealton S McLean, MD;  [facility];  Service: Cardiovascular;  Laterality: N/A;  . Cardiac catheterization    . Mitral valve repair Right 08/15/2012    Procedure: MINIMALLY INVASIVE MITRAL VALVE REPAIR (MVR);  Surgeon: Purcell Nailslarence H  Owen, MD;  [facility];  Service: Open Heart Surgery;  Laterality: Right;  . Intraoperative transesophageal echocardiogram N/A 08/15/2012    Procedure: INTRAOPERATIVE TRANSESOPHAGEAL ECHOCARDIOGRAM;  Surgeon: Purcell Nailslarence H Owen, MD;  [facility];  Service: Open Heart Surgery;  Laterality: N/A;    Family History  Problem Relation Age of Onset  . Coronary artery disease    . Heart attack Father   . Heart attack Brother 60  . Hypertension Father   . Hypertension Sister   . Hypertension Brother   . Hyperlipidemia Sister   . Diabetes Sister   . Diabetes Brother     History   Social History  . Marital Status: Married    Spouse Name: N/A    Number of Children: 3  . Years of Education: N/A   Occupational History  . Industrial hygeinist    Social History Main Topics  . Smoking status: Former Smoker    Types: Cigars    Quit date: 03/22/1973  . Smokeless tobacco: Former NeurosurgeonUser    Types: Chew    Quit date: 08/15/2012  . Alcohol Use: No  . Drug Use: No  . Sexual Activity: Not Currently     ROS Constitutional: Denies fever, chills, weight loss/gain, headaches, insomnia, fatigue, night sweats, and change in appetite. Eyes: Denies redness, blurred vision, diplopia, discharge, itchy, watery eyes.  ENT: Denies discharge, congestion, post nasal drip, epistaxis, sore throat, earache, hearing loss, dental pain, Tinnitus, Vertigo, Sinus pain, snoring.  Cardio: Denies chest pain, palpitations, irregular heartbeat, syncope, dyspnea, diaphoresis, orthopnea, PND, claudication, edema Respiratory: denies cough, dyspnea, DOE, pleurisy, hoarseness, laryngitis, wheezing.  Gastrointestinal: Denies dysphagia, heartburn, reflux, water brash, pain, cramps, nausea, vomiting, bloating, diarrhea, constipation, hematemesis, melena, hematochezia, jaundice, hemorrhoids Genitourinary: Denies dysuria, frequency, urgency, nocturia, hesitancy, discharge, hematuria, flank pain Musculoskeletal: Denies  arthralgia, myalgia, stiffness, Jt. Swelling, pain, limp, and strain/sprain. Skin: Denies puritis, rash, hives, warts, acne, eczema, changing in skin lesion Neuro: No weakness, tremor, incoordination, spasms, paresthesia, pain Psychiatric: Denies confusion, memory loss, sensory loss Endocrine: Denies change in weight, skin, hair change, nocturia, and paresthesia, diabetic polys, visual blurring, hyper / hypo glycemic episodes.  Heme/Lymph: No excessive bleeding, bruising, or elarged lymph nodes.  BP: 120/84  Pulse: 76  Temp: 97.9 F (36.6 C)  Resp: 16    Estimated body mass index is 31.58 kg/(m^2) as calculated from the following:   Height as of this encounter: 6' 0.5" (1.842 m).   Weight as of this encounter: 236 lb  3.2 oz (107.14 kg).  Physical Exam General Appearance: Well nourished, in no apparent distress. Eyes: PERRLA, EOMs, conjunctiva no swelling or erythema, normal fundi and vessels. Sinuses: No frontal/maxillary tenderness ENT/Mouth: EACs patent / TMs  nl. Nares clear without erythema, swelling, mucoid exudates. Oral hygiene is good. No erythema, swelling, or exudate. Tongue normal, non-obstructing. Tonsils not swollen or erythematous. Hearing normal.  Neck: Supple, thyroid normal. No bruits, nodes or JVD. Respiratory: Respiratory effort normal.  BS equal and clear bilateral without rales, rhonci, wheezing or stridor. Cardio: Heart sounds are normal with regular rate and rhythm and no murmurs, rubs or gallops. Peripheral pulses are normal and equal bilaterally without edema. No aortic or femoral bruits. Chest: symmetric with normal excursions and percussion.  Abdomen: Flat, soft, with bowl sounds. Nontender, no guarding, rebound, hernias, masses, or organomegaly.  Lymphatics: Non tender without lymphadenopathy.  Genitourinary: No hernias.Testes nl. DRE - prostate nl for age - smooth & firm w/o nodules. Musculoskeletal: Full ROM all peripheral extremities, joint stability, 5/5  strength, and normal gait. Skin: Warm and dry without rashes, lesions, cyanosis, clubbing or  ecchymosis.  Neuro: Cranial nerves intact, reflexes equal bilaterally. Normal muscle tone, no cerebellar symptoms. Sensation intact.  Pysch: Awake and oriented X 3, normal affect, insight and judgment appropriate.   Assessment and Plan  1. Annual Screening Examination 2. Hypertension  3. Hyperlipidemia 4. T2 NIDDM, Diet 5. Vitamin D Deficiency  Continue prudent diet as discussed, weight control, BP monitoring, regular exercise, and medications as discussed.  Discussed med effects and SE's. Routine screening labs and tests as requested with regular follow-up as recommended.

## 2013-05-25 LAB — HEPATIC FUNCTION PANEL
ALBUMIN: 4.7 g/dL (ref 3.5–5.2)
ALT: 22 U/L (ref 0–53)
AST: 28 U/L (ref 0–37)
Alkaline Phosphatase: 46 U/L (ref 39–117)
BILIRUBIN TOTAL: 0.4 mg/dL (ref 0.2–1.2)
Bilirubin, Direct: 0.1 mg/dL (ref 0.0–0.3)
Indirect Bilirubin: 0.3 mg/dL (ref 0.2–1.2)
Total Protein: 7.3 g/dL (ref 6.0–8.3)

## 2013-05-25 LAB — VITAMIN D 25 HYDROXY (VIT D DEFICIENCY, FRACTURES): Vit D, 25-Hydroxy: 51 ng/mL (ref 30–89)

## 2013-05-25 LAB — BASIC METABOLIC PANEL WITH GFR
BUN: 17 mg/dL (ref 6–23)
CALCIUM: 9.6 mg/dL (ref 8.4–10.5)
CO2: 24 meq/L (ref 19–32)
Chloride: 105 mEq/L (ref 96–112)
Creat: 1.47 mg/dL — ABNORMAL HIGH (ref 0.50–1.35)
GFR, EST AFRICAN AMERICAN: 57 mL/min — AB
GFR, Est Non African American: 49 mL/min — ABNORMAL LOW
Glucose, Bld: 97 mg/dL (ref 70–99)
Potassium: 4.1 mEq/L (ref 3.5–5.3)
SODIUM: 139 meq/L (ref 135–145)

## 2013-05-25 LAB — MICROALBUMIN / CREATININE URINE RATIO
CREATININE, URINE: 174.2 mg/dL
MICROALB UR: 3.87 mg/dL — AB (ref 0.00–1.89)
Microalb Creat Ratio: 22.2 mg/g (ref 0.0–30.0)

## 2013-05-25 LAB — LIPID PANEL
CHOL/HDL RATIO: 4.3 ratio
CHOLESTEROL: 195 mg/dL (ref 0–200)
HDL: 45 mg/dL (ref >39)
LDL Cholesterol: 129 mg/dL — ABNORMAL HIGH (ref 0–99)
Triglycerides: 106 mg/dL (ref <150)
VLDL: 21 mg/dL (ref 0–40)

## 2013-05-25 LAB — URINALYSIS, MICROSCOPIC ONLY
BACTERIA UA: NONE SEEN
Casts: NONE SEEN
Crystals: NONE SEEN
Squamous Epithelial / LPF: NONE SEEN

## 2013-05-25 LAB — TSH: TSH: 1.3 u[IU]/mL (ref 0.350–4.500)

## 2013-05-25 LAB — HEMOGLOBIN A1C
Hgb A1c MFr Bld: 6.5 % — ABNORMAL HIGH (ref <5.7)
Mean Plasma Glucose: 140 mg/dL — ABNORMAL HIGH (ref <117)

## 2013-05-25 LAB — MAGNESIUM: MAGNESIUM: 1.9 mg/dL (ref 1.5–2.5)

## 2013-05-25 LAB — INSULIN, FASTING: Insulin fasting, serum: 22 u[IU]/mL (ref 3–28)

## 2013-05-25 LAB — PSA: PSA: 0.64 ng/mL (ref <=4.00)

## 2013-05-29 ENCOUNTER — Encounter (HOSPITAL_COMMUNITY): Payer: 59

## 2013-05-30 ENCOUNTER — Encounter (HOSPITAL_COMMUNITY): Payer: 59

## 2013-05-31 ENCOUNTER — Encounter (HOSPITAL_COMMUNITY): Payer: 59

## 2013-06-04 ENCOUNTER — Encounter (HOSPITAL_COMMUNITY): Payer: Self-pay | Admitting: Emergency Medicine

## 2013-06-04 ENCOUNTER — Other Ambulatory Visit: Payer: Self-pay | Admitting: Cardiovascular Disease

## 2013-06-04 ENCOUNTER — Emergency Department (INDEPENDENT_AMBULATORY_CARE_PROVIDER_SITE_OTHER)
Admission: EM | Admit: 2013-06-04 | Discharge: 2013-06-04 | Disposition: A | Discharge status: Home / Self Care | Payer: 59 | Source: Home / Self Care | Attending: Family Medicine | Admitting: Family Medicine

## 2013-06-04 DIAGNOSIS — S99919A Unspecified injury of unspecified ankle, initial encounter: Secondary | ICD-10-CM | POA: Diagnosis not present

## 2013-06-04 DIAGNOSIS — S99921A Unspecified injury of right foot, initial encounter: Secondary | ICD-10-CM

## 2013-06-04 DIAGNOSIS — S99929A Unspecified injury of unspecified foot, initial encounter: Secondary | ICD-10-CM

## 2013-06-04 DIAGNOSIS — S8990XA Unspecified injury of unspecified lower leg, initial encounter: Secondary | ICD-10-CM

## 2013-06-04 NOTE — ED Notes (Signed)
68 yr old male is here today with complaints of a splinter in his right heel x today.

## 2013-06-04 NOTE — Discharge Instructions (Signed)
Return if any problems.

## 2013-06-04 NOTE — ED Provider Notes (Signed)
CSN: [id number]     Arrival date & time 06/04/13  1834 History   First MD Initiated Contact with Patient 06/04/13 1926     No chief complaint on file.  (Consider location/radiation/quality/duration/timing/severity/associated sxs/prior Treatment) Patient is a 68 y.o. male presenting with foot injury.  Foot Injury Location:  Foot Injury: yes   Mechanism of injury comment:  Pt and wife concerned about poss splinter in foot Foot location:  R foot Chronicity:  New Dislocation: no   Foreign body present:  Wood Prior injury to area:  No   Past Medical History  Diagnosis Date  . Pulmonary embolism     march 2010, 2012              /   DVT x  2  LEFT 2010  . Coronary artery disease     non-obstructive by cath 2009  . Pre-diabetes   . Peripheral vascular disease   . Severe mitral regurgitation by prior echocardiogram   . Sleep apnea     STOP BANG SCORE 4  . S/P mitral valve repair 08/15/2012    Complex valvuloplasty including triangular resection of posterior leaflet, artificial Gore-tex neocord placement x4 and Sorin Memo 3D ring annuloplasty via right mini thoracotomy approach  . Hyperlipidemia   . Hypertension     Clearance with note Dr Marvel Plan on chart,  Lovonox bridging  ordered by Dr Sanjuana Kava    EKG 9/12, chst 9/12 EPIC  . Heart murmur   . Seasonal allergies   . Osteoarthritis   . Vitamin D deficiency   . Degenerative joint disease    Past Surgical History  Procedure Laterality Date  . Achillis tendon      repair 20 years ago  . Hip arthroplasty      11/06/07  . Total hip arthroplasty  08/24/2011    Procedure: TOTAL HIP ARTHROPLASTY ANTERIOR APPROACH;  Surgeon: Shelda Pal, MD;  Location: [facility];  Service: Orthopedics;  Laterality: Right;  . Tee without cardioversion N/A 07/04/2012    Procedure: TRANSESOPHAGEAL ECHOCARDIOGRAM (TEE);  Surgeon: Laurey Morale, MD;  [facility] END[facility];  Service: Cardiovascular;  Laterality: N/A;  . Cardiac catheterization    . Mitral  valve repair Right 08/15/2012    Procedure: MINIMALLY INVASIVE MITRAL VALVE REPAIR (MVR);  Surgeon: Purcell Nails, MD;  Location: [facility] OR;  Service: Open Heart Surgery;  Laterality: Right;  . Intraoperative transesophageal echocardiogram N/A 08/15/2012    Procedure: INTRAOPERATIVE TRANSESOPHAGEAL ECHOCARDIOGRAM;  Surgeon: Purcell Nails, MD;  Location: [facility] OR;  Service: Open Heart Surgery;  Laterality: N/A;   Family History  Problem Relation Age of Onset  . Coronary artery disease    . Heart attack Father   . Heart attack Brother 60  . Hypertension Father   . Hypertension Sister   . Hypertension Brother   . Hyperlipidemia Sister   . Diabetes Sister   . Diabetes Brother    History  Substance Use Topics  . Smoking status: Former Smoker    Types: Cigars    Quit date: 03/22/1973  . Smokeless tobacco: Former Neurosurgeon    Types: Chew    Quit date: 08/15/2012  . Alcohol Use: No    Review of Systems  Constitutional: Negative.   Skin: Negative.     Allergies  Statins and Welchol  Home Medications   Current Outpatient Rx  Name  Route  Sig  Dispense  Refill  . acetaminophen (TYLENOL) 500 MG tablet   Oral  Take 500 mg by mouth every 6 (six) hours as needed for pain.         . benazepril (LOTENSIN) 20 MG tablet   Oral   Take 10 mg by mouth every evening.          . Cholecalciferol (VITAMIN D PO)   Oral   Take 5,000 Units by mouth daily.         . fenofibrate micronized (LOFIBRA) 134 MG capsule   Oral   Take 134 mg by mouth every evening.          . fexofenadine (ALLEGRA) 30 MG tablet   Oral   Take 30 mg by mouth daily.         . Flaxseed, Linseed, (FLAXSEED OIL) 1000 MG CAPS   Oral   Take by mouth daily.         . hydrocortisone (ANUSOL-HC) 2.5 % rectal cream      Apply to hemorrhoids 4 x daily as needed   30 g   5   . warfarin (COUMADIN) 5 MG tablet      TAKE AS DIRECTED BY ANTICOAGULATION CLINIC   160 tablet   1   . loratadine (CLARITIN) 10 MG  tablet   Oral   Take 10 mg by mouth daily as needed for allergies.          BP 126/80  Pulse 74  Temp(Src) 98.5 F (36.9 C) (Oral)  Resp 20  SpO2 97% Physical Exam  Nursing note and vitals reviewed. Constitutional: He is oriented to person, place, and time. He appears well-developed and well-nourished.  Musculoskeletal: He exhibits no tenderness.  Exam is neg and pt and wife agree that no fb site is evident and no treatment needed. It must have worked itself out according to wife.  Neurological: He is alert and oriented to person, place, and time.  Skin: Skin is warm and dry.    ED Course  Procedures (including critical care time) Labs Review Labs Reviewed - No data to display Imaging Review No results found.   MDM   1. Right foot injury        Linna HoffJames D Kindl, MD 06/04/13 2001

## 2013-06-05 ENCOUNTER — Encounter (HOSPITAL_COMMUNITY): Payer: 59

## 2013-06-06 ENCOUNTER — Other Ambulatory Visit (INDEPENDENT_AMBULATORY_CARE_PROVIDER_SITE_OTHER): Payer: 59

## 2013-06-06 ENCOUNTER — Encounter (HOSPITAL_COMMUNITY): Payer: 59

## 2013-06-06 DIAGNOSIS — Z1212 Encounter for screening for malignant neoplasm of rectum: Secondary | ICD-10-CM

## 2013-06-06 LAB — POC HEMOCCULT BLD/STL (HOME/3-CARD/SCREEN)
Card #2 Fecal Occult Blod, POC: NEGATIVE
Card #3 Fecal Occult Blood, POC: NEGATIVE
Fecal Occult Blood, POC: NEGATIVE

## 2013-06-07 ENCOUNTER — Encounter (HOSPITAL_COMMUNITY): Payer: 59

## 2013-06-11 ENCOUNTER — Ambulatory Visit (INDEPENDENT_AMBULATORY_CARE_PROVIDER_SITE_OTHER): Payer: 59 | Admitting: *Deleted

## 2013-06-11 DIAGNOSIS — Z5181 Encounter for therapeutic drug level monitoring: Secondary | ICD-10-CM | POA: Diagnosis not present

## 2013-06-11 DIAGNOSIS — Z7901 Long term (current) use of anticoagulants: Secondary | ICD-10-CM | POA: Diagnosis not present

## 2013-06-11 DIAGNOSIS — I059 Rheumatic mitral valve disease, unspecified: Secondary | ICD-10-CM

## 2013-06-11 DIAGNOSIS — Z9889 Other specified postprocedural states: Secondary | ICD-10-CM | POA: Diagnosis not present

## 2013-06-11 DIAGNOSIS — I340 Nonrheumatic mitral (valve) insufficiency: Secondary | ICD-10-CM

## 2013-06-11 DIAGNOSIS — I2699 Other pulmonary embolism without acute cor pulmonale: Secondary | ICD-10-CM

## 2013-06-11 LAB — POCT INR: INR: 2.8

## 2013-06-12 ENCOUNTER — Encounter (HOSPITAL_COMMUNITY): Payer: 59

## 2013-06-13 ENCOUNTER — Encounter (HOSPITAL_COMMUNITY): Payer: 59

## 2013-06-14 ENCOUNTER — Encounter (HOSPITAL_COMMUNITY): Payer: 59

## 2013-06-15 ENCOUNTER — Encounter: Payer: Self-pay | Admitting: Internal Medicine

## 2013-06-19 ENCOUNTER — Encounter (HOSPITAL_COMMUNITY): Payer: 59

## 2013-06-20 ENCOUNTER — Encounter (HOSPITAL_COMMUNITY): Payer: 59

## 2013-06-21 ENCOUNTER — Encounter (HOSPITAL_COMMUNITY): Payer: 59

## 2013-06-22 ENCOUNTER — Encounter: Payer: Self-pay | Admitting: Cardiovascular Disease

## 2013-06-22 ENCOUNTER — Ambulatory Visit (INDEPENDENT_AMBULATORY_CARE_PROVIDER_SITE_OTHER): Payer: 59 | Admitting: Cardiovascular Disease

## 2013-06-22 VITALS — BP 128/87 | HR 73 | Ht 72.5 in | Wt 231.0 lb

## 2013-06-22 DIAGNOSIS — Z7901 Long term (current) use of anticoagulants: Secondary | ICD-10-CM | POA: Diagnosis not present

## 2013-06-22 DIAGNOSIS — I2782 Chronic pulmonary embolism: Secondary | ICD-10-CM

## 2013-06-22 DIAGNOSIS — I2510 Atherosclerotic heart disease of native coronary artery without angina pectoris: Secondary | ICD-10-CM | POA: Diagnosis not present

## 2013-06-22 DIAGNOSIS — R42 Dizziness and giddiness: Secondary | ICD-10-CM | POA: Diagnosis not present

## 2013-06-22 DIAGNOSIS — I059 Rheumatic mitral valve disease, unspecified: Secondary | ICD-10-CM | POA: Diagnosis not present

## 2013-06-22 NOTE — Patient Instructions (Signed)
Your physician wants you to follow-up in:  12 months.  You will receive a reminder letter in the mail two months in advance. If you don't receive a letter, please call our office to schedule the follow-up appointment.   

## 2013-06-22 NOTE — Progress Notes (Signed)
History of Present Illness:  68 yo male with history of HTN, recurrent pulmonary emboli on anticoagulation, nonobstructive CAD and prior MV prolapse with MR now s/p MV repair May 2014 per Dr. Cornelius Moras who is here today for cardiac follow up. Post op course was uneventful but he did have some PVCs and short runs of VT and was started on beta blocker therapy. Cardiac cath April 2014 with mild CAD. He had a hip replacement 2009 and presented with SOB March 2010 and was found to have bilateral pulmonary emboli. Repeat studies without PE or DVT so coumadin therapy was stopped. Readmitted to [facility] October 31, 2010 with SOB and found to have recurrent Pulmonary Embolism by CTA. He has been on coumadin chronically since then. I saw him 11/27/12 after he had several episodes of dizziness. The first was while driving. He did not pass out. Several subsequent episodes of mild dizziness. No associated palpitations or awareness of irregularity of his heart rhythm. No chest pain or SOB. He has been in cardiac rehab and doing well with exercise. I arranged a 21 day event monitor which showed sinus rhythm with PVCs but no other arrythmias.   He is here today for follow up. He has been doing well. No chest pain. He has occasional SOB when lying in bed. Dry cough. No fever, chills. No further dizziness, near syncope or syncope.   Primary Care Physician: Lucky Cowboy  Past Medical History  Diagnosis Date  . Pulmonary embolism     march 2010, 2012              /   DVT x  2  LEFT 2010  . Coronary artery disease     non-obstructive by cath 2009  . Pre-diabetes   . Peripheral vascular disease   . Severe mitral regurgitation by prior echocardiogram   . Sleep apnea     STOP BANG SCORE 4  . S/P mitral valve repair 08/15/2012    Complex valvuloplasty including triangular resection of posterior leaflet, artificial Gore-tex neocord placement x4 and Sorin Memo 3D ring annuloplasty via right mini thoracotomy approach  .  Hyperlipidemia   . Hypertension     Clearance with note Dr Marvel Plan on chart,  Lovonox bridging  ordered by Dr Sanjuana Kava    EKG 9/12, chst 9/12 EPIC  . Heart murmur   . Seasonal allergies   . Osteoarthritis   . Vitamin D deficiency   . Degenerative joint disease     Past Surgical History  Procedure Laterality Date  . Achillis tendon      repair 20 years ago  . Hip arthroplasty      11/06/07  . Total hip arthroplasty  08/24/2011    Procedure: TOTAL HIP ARTHROPLASTY ANTERIOR APPROACH;  Surgeon: Shelda Pal, MD;  Location: [facility] ORS;  Service: Orthopedics;  Laterality: Right;  . Tee without cardioversion N/A 07/04/2012    Procedure: TRANSESOPHAGEAL ECHOCARDIOGRAM (TEE);  Surgeon: Laurey Morale, MD;  Location: [facility] ENDOSCOPY;  Service: Cardiovascular;  Laterality: N/A;  . Cardiac catheterization    . Mitral valve repair Right 08/15/2012    Procedure: MINIMALLY INVASIVE MITRAL VALVE REPAIR (MVR);  Surgeon: Purcell Nails, MD;  Location: [facility] OR;  Service: Open Heart Surgery;  Laterality: Right;  . Intraoperative transesophageal echocardiogram N/A 08/15/2012    Procedure: INTRAOPERATIVE TRANSESOPHAGEAL ECHOCARDIOGRAM;  Surgeon: Purcell Nails, MD;  Location: [facility] OR;  Service: Open Heart Surgery;  Laterality: N/A;    Current Outpatient  Prescriptions  Medication Sig Dispense Refill  . acetaminophen (TYLENOL) 500 MG tablet Take 500 mg by mouth every 6 (six) hours as needed for pain.      . benazepril (LOTENSIN) 20 MG tablet Take 10 mg by mouth every evening.       . Cholecalciferol (VITAMIN D PO) Take by mouth daily. TAKING 5,000 UNITS 4 TIMES A WEEK AN 2,000 UNITS 3 TIMES A WEEK      . fenofibrate micronized (LOFIBRA) 134 MG capsule Take 134 mg by mouth every evening.       . fexofenadine (ALLEGRA) 30 MG tablet Take 30 mg by mouth as needed.       . hydrocortisone (ANUSOL-HC) 2.5 % rectal cream Apply to hemorrhoids 4 x daily as needed  30 g  5  . loratadine (CLARITIN) 10 MG tablet Take 10 mg by  mouth daily as needed for allergies.      Marland Kitchen. warfarin (COUMADIN) 5 MG tablet TAKE AS DIRECTED BY ANTICOAGULATION CLINIC  160 tablet  0   No current facility-administered medications for this visit.    Allergies  Allergen Reactions  . Statins     REACTION: muscle aches  . Welchol [Colesevelam Hcl]     History   Social History  . Marital Status: Married    Spouse Name: N/A    Number of Children: 3  . Years of Education: N/A   Occupational History  . Industrial hygeinist    Social History Main Topics  . Smoking status: Former Smoker    Types: Cigars    Quit date: 03/22/1973  . Smokeless tobacco: Former NeurosurgeonUser    Types: Chew    Quit date: 08/15/2012  . Alcohol Use: No  . Drug Use: No  . Sexual Activity: Not Currently   Other Topics Concern  . Not on file   Social History Narrative  . No narrative on file    Family History  Problem Relation Age of Onset  . Coronary artery disease    . Heart attack Father   . Heart attack Brother 60  . Hypertension Father   . Hypertension Sister   . Hypertension Brother   . Hyperlipidemia Sister   . Diabetes Sister   . Diabetes Brother     Review of Systems:  As stated in the HPI and otherwise negative.   BP 128/87  Pulse 73  Ht 6' 0.5" (1.842 m)  Wt 231 lb (104.781 kg)  BMI 30.88 kg/m2  Physical Examination: General: Well developed, well nourished, NAD HEENT: OP clear, mucus membranes moist SKIN: warm, dry. No rashes. Neuro: No focal deficits Musculoskeletal: Muscle strength 5/5 all ext Psychiatric: Mood and affect normal Neck: No JVD, no carotid bruits, no thyromegaly, no lymphadenopathy. Lungs:Clear bilaterally, no wheezes, rhonci, crackles Cardiovascular: Regular rate and rhythm. No murmurs, gallops or rubs. Abdomen:Soft. Bowel sounds present. Non-tender.  Extremities: No lower extremity edema. Pulses are 2 + in the bilateral DP/PT.  Assessment and Plan:   1. MITRAL REGURGITATION: s/p Mitral valve repair.  Stable.   2. Recurrent DVT/PE: He will need coumadin for lifetime.  .  3. CAD, NATIVE VESSEL: Stable. No changes. No ASA since he is on long term coumadin.   4. Dizziness, near syncope: Still having occasional dizziness.  Event monitor with sinus, PVCs. No VT. No change on cardiac examination. Carotid dopplers normal 2013. No further cardiac workup.  5. Cough: Dry cough. No fevers. He thinks it is related to post nasal drip. It could be  related to his Ace-inh. He does not wish to stop yet. If he has continued dry cough, would stop Ace-inh and start ARB for HTN

## 2013-06-26 ENCOUNTER — Encounter (HOSPITAL_COMMUNITY): Payer: 59

## 2013-06-27 ENCOUNTER — Encounter (HOSPITAL_COMMUNITY): Payer: 59

## 2013-06-28 ENCOUNTER — Encounter (HOSPITAL_COMMUNITY): Payer: 59

## 2013-07-03 ENCOUNTER — Encounter (HOSPITAL_COMMUNITY): Payer: 59

## 2013-07-04 ENCOUNTER — Encounter (HOSPITAL_COMMUNITY): Payer: 59

## 2013-07-05 ENCOUNTER — Encounter (HOSPITAL_COMMUNITY): Payer: 59

## 2013-07-10 ENCOUNTER — Encounter (HOSPITAL_COMMUNITY): Payer: 59

## 2013-07-11 ENCOUNTER — Encounter (HOSPITAL_COMMUNITY): Payer: 59

## 2013-07-12 ENCOUNTER — Encounter (HOSPITAL_COMMUNITY): Payer: 59

## 2013-07-17 ENCOUNTER — Encounter (HOSPITAL_COMMUNITY): Payer: 59

## 2013-07-17 ENCOUNTER — Ambulatory Visit: Payer: Medicare Other | Admitting: Cardiovascular Disease

## 2013-07-18 ENCOUNTER — Encounter (HOSPITAL_COMMUNITY): Payer: 59

## 2013-07-19 ENCOUNTER — Encounter (HOSPITAL_COMMUNITY): Payer: 59

## 2013-07-23 ENCOUNTER — Ambulatory Visit (INDEPENDENT_AMBULATORY_CARE_PROVIDER_SITE_OTHER): Payer: 59 | Admitting: *Deleted

## 2013-07-23 DIAGNOSIS — Z9889 Other specified postprocedural states: Secondary | ICD-10-CM | POA: Diagnosis not present

## 2013-07-23 DIAGNOSIS — Z5181 Encounter for therapeutic drug level monitoring: Secondary | ICD-10-CM

## 2013-07-23 DIAGNOSIS — I2699 Other pulmonary embolism without acute cor pulmonale: Secondary | ICD-10-CM | POA: Diagnosis not present

## 2013-07-23 DIAGNOSIS — I059 Rheumatic mitral valve disease, unspecified: Secondary | ICD-10-CM

## 2013-07-23 DIAGNOSIS — I340 Nonrheumatic mitral (valve) insufficiency: Secondary | ICD-10-CM

## 2013-07-23 DIAGNOSIS — Z7901 Long term (current) use of anticoagulants: Secondary | ICD-10-CM

## 2013-07-23 LAB — POCT INR: INR: 2.6

## 2013-08-24 ENCOUNTER — Encounter: Payer: Self-pay | Admitting: Physician Assistant

## 2013-08-24 ENCOUNTER — Ambulatory Visit (INDEPENDENT_AMBULATORY_CARE_PROVIDER_SITE_OTHER): Payer: 59 | Admitting: Physician Assistant

## 2013-08-24 VITALS — BP 132/82 | HR 60 | Temp 97.9°F | Resp 16 | Wt 234.0 lb

## 2013-08-24 DIAGNOSIS — E785 Hyperlipidemia, unspecified: Secondary | ICD-10-CM

## 2013-08-24 DIAGNOSIS — Z1331 Encounter for screening for depression: Secondary | ICD-10-CM

## 2013-08-24 DIAGNOSIS — E1129 Type 2 diabetes mellitus with other diabetic kidney complication: Secondary | ICD-10-CM

## 2013-08-24 DIAGNOSIS — Z0000 Encounter for general adult medical examination without abnormal findings: Secondary | ICD-10-CM

## 2013-08-24 DIAGNOSIS — Z789 Other specified health status: Secondary | ICD-10-CM

## 2013-08-24 DIAGNOSIS — Z7901 Long term (current) use of anticoagulants: Secondary | ICD-10-CM

## 2013-08-24 DIAGNOSIS — I10 Essential (primary) hypertension: Secondary | ICD-10-CM

## 2013-08-24 DIAGNOSIS — Z79899 Other long term (current) drug therapy: Secondary | ICD-10-CM

## 2013-08-24 DIAGNOSIS — E559 Vitamin D deficiency, unspecified: Secondary | ICD-10-CM

## 2013-08-24 MED ORDER — CLOTRIMAZOLE-BETAMETHASONE 1-0.05 % EX CREA: 1.0000 "application " | TOPICAL_CREAM | Freq: Two times a day (BID) | CUTANEOUS | Status: DC

## 2013-08-24 NOTE — Patient Instructions (Signed)
 Bad carbs also include fruit juice, alcohol, and sweet tea. These are empty calories that do not signal to your brain that you are full.   Please remember the good carbs are still carbs which convert into sugar. So please measure them out no more than 1/2-1 cup of rice, oatmeal, pasta, and beans.  Veggies are however free foods! Pile them on.   I like lean protein at every meal such as chicken, turkey, pork chops, cottage cheese, etc. Just do not fry these meats and please center your meal around vegetable, the meats should be a side dish.   No all fruit is created equal. Please see the list below, the fruit at the bottom is higher in sugars than the fruit at the top   Preventative Care for Adults, Male       REGULAR HEALTH EXAMS:  A routine yearly physical is a good way to check in with your primary care provider about your health and preventive screening. It is also an opportunity to share updates about your health and any concerns you have, and receive a thorough all-over exam.   Most health insurance companies pay for at least some preventative services.  Check with your health plan for specific coverages.  WHAT PREVENTATIVE SERVICES DO MEN NEED?  Adult men should have their weight and blood pressure checked regularly.   Men age 35 and older should have their cholesterol levels checked regularly.  Beginning at age 50 and continuing to age 75, men should be screened for colorectal cancer.  Certain people should may need continued testing until age 85.  Other cancer screening may include exams for testicular and prostate cancer.  Updating vaccinations is part of preventative care.  Vaccinations help protect against diseases such as the flu.  Lab tests are generally done as part of preventative care to screen for anemia and blood disorders, to screen for problems with the kidneys and liver, to screen for bladder problems, to check blood sugar, and to check your cholesterol  level.  Preventative services generally include counseling about diet, exercise, avoiding tobacco, drugs, excessive alcohol consumption, and sexually transmitted infections.    GENERAL RECOMMENDATIONS FOR GOOD HEALTH:  Healthy diet:  Eat a variety of foods, including fruit, vegetables, animal or vegetable protein, such as meat, fish, chicken, and eggs, or beans, lentils, tofu, and grains, such as rice.  Drink plenty of water daily.  Decrease saturated fat in the diet, avoid lots of red meat, processed foods, sweets, fast foods, and fried foods.  Exercise:  Aerobic exercise helps maintain good heart health. At least 30-40 minutes of moderate-intensity exercise is recommended. For example, a brisk walk that increases your heart rate and breathing. This should be done on most days of the week.   Find a type of exercise or a variety of exercises that you enjoy so that it becomes a part of your daily life.  Examples are running, walking, swimming, water aerobics, and biking.  For motivation and support, explore group exercise such as aerobic class, spin class, Zumba, Yoga,or  martial arts, etc.    Set exercise goals for yourself, such as a certain weight goal, walk or run in a race such as a 5k walk/run.  Speak to your primary care provider about exercise goals.  Disease prevention:  If you smoke or chew tobacco, find out from your caregiver how to quit. It can literally save your life, no matter how long you have been a tobacco user. If you   do not use tobacco, never begin.   Maintain a healthy diet and normal weight. Increased weight leads to problems with blood pressure and diabetes.   The Body Mass Index or BMI is a way of measuring how much of your body is fat. Having a BMI above 27 increases the risk of heart disease, diabetes, hypertension, stroke and other problems related to obesity. Your caregiver can help determine your BMI and based on it develop an exercise and dietary program to  help you achieve or maintain this important measurement at a healthful level.  High blood pressure causes heart and blood vessel problems.  Persistent high blood pressure should be treated with medicine if weight loss and exercise do not work.   Fat and cholesterol leaves deposits in your arteries that can block them. This causes heart disease and vessel disease elsewhere in your body.  If your cholesterol is found to be high, or if you have heart disease or certain other medical conditions, then you may need to have your cholesterol monitored frequently and be treated with medication.   Ask if you should have a stress test if your history suggests this. A stress test is a test done on a treadmill that looks for heart disease. This test can find disease prior to there being a problem.  Avoid drinking alcohol in excess (more than two drinks per day).  Avoid use of street drugs. Do not share needles with anyone. Ask for professional help if you need assistance or instructions on stopping the use of alcohol, cigarettes, and/or drugs.  Brush your teeth twice a day with fluoride toothpaste, and floss once a day. Good oral hygiene prevents tooth decay and gum disease. The problems can be painful, unattractive, and can cause other health problems. Visit your dentist for a routine oral and dental check up and preventive care every 6-12 months.   Look at your skin regularly.  Use a mirror to look at your back. Notify your caregivers of changes in moles, especially if there are changes in shapes, colors, a size larger than a pencil eraser, an irregular border, or development of new moles.  Safety:  Use seatbelts 100% of the time, whether driving or as a passenger.  Use safety devices such as hearing protection if you work in environments with loud noise or significant background noise.  Use safety glasses when doing any work that could send debris in to the eyes.  Use a helmet if you ride a bike or motorcycle.   Use appropriate safety gear for contact sports.  Talk to your caregiver about gun safety.  Use sunscreen with a SPF (or skin protection factor) of 15 or greater.  Lighter skinned people are at a greater risk of skin cancer. Don't forget to also wear sunglasses in order to protect your eyes from too much damaging sunlight. Damaging sunlight can accelerate cataract formation.   Practice safe sex. Use condoms. Condoms are used for birth control and to help reduce the spread of sexually transmitted infections (or STIs).  Some of the STIs are gonorrhea (the clap), chlamydia, syphilis, trichomonas, herpes, HPV (human papilloma virus) and HIV (human immunodeficiency virus) which causes AIDS. The herpes, HIV and HPV are viral illnesses that have no cure. These can result in disability, cancer and death.   Keep carbon monoxide and smoke detectors in your home functioning at all times. Change the batteries every 6 months or use a model that plugs into the wall.   Vaccinations:  Stay   up to date with your tetanus shots and other required immunizations. You should have a booster for tetanus every 10 years. Be sure to get your flu shot every year, since 5%-20% of the [place] population comes down with the flu. The flu vaccine changes each year, so being vaccinated once is not enough. Get your shot in the fall, before the flu season peaks.   Other vaccines to consider:  Pneumococcal vaccine to protect against certain types of pneumonia.  This is normally recommended for adults age 65 or older.  However, adults younger than 67 years old with certain underlying conditions such as diabetes, heart or lung disease should also receive the vaccine.  Shingles vaccine to protect against Varicella Zoster if you are older than age 60, or younger than 67 years old with certain underlying illness.  Hepatitis A vaccine to protect against a form of infection of the liver by a virus acquired from food.  Hepatitis B vaccine to  protect against a form of infection of the liver by a virus acquired from blood or body fluids, particularly if you work in health care.  If you plan to travel internationally, check with your local health department for specific vaccination recommendations.  Cancer Screening:  Most routine colon cancer screening begins at the age of 50. On a yearly basis, doctors may provide special easy to use take-home tests to check for hidden blood in the stool. Sigmoidoscopy or colonoscopy can detect the earliest forms of colon cancer and is life saving. These tests use a small camera at the end of a tube to directly examine the colon. Speak to your caregiver about this at age 50, when routine screening begins (and is repeated every 5 years unless early forms of pre-cancerous polyps or small growths are found).   At the age of 50 men usually start screening for prostate cancer every year. Screening may begin at a younger age for those with higher risk. Those at higher risk include African-Americans or having a family history of prostate cancer. There are two types of tests for prostate cancer:   Prostate-specific antigen (PSA) testing. Recent studies raise questions about prostate cancer using PSA and you should discuss this with your caregiver.   Digital rectal exam (in which your doctor's lubricated and gloved finger feels for enlargement of the prostate through the anus).   Screening for testicular cancer.  Do a monthly exam of your testicles. Gently roll each testicle between your thumb and fingers, feeling for any abnormal lumps. The best time to do this is after a hot shower or bath when the tissues are looser. Notify your caregivers of any lumps, tenderness or changes in size or shape immediately.     

## 2013-08-24 NOTE — Progress Notes (Signed)
MEDICARE ANNUAL WELLNESS VISIT AND FOLLOW UP Assessment:   1. Hypertension - CBC with Differential - BASIC METABOLIC PANEL WITH GFR - Hepatic function panel - TSH  2. 2 NIDDM w/ CKD moderate (GFR 48)  Discussed general issues about diabetes pathophysiology and management., Educational material distributed., Suggested low cholesterol diet., Encouraged aerobic exercise., Discussed foot care., Reminded to get yearly retinal exam. - Hemoglobin A1c - Insulin, fasting  3. Encounter for long-term (current) use of anticoagulants Continue follow up at [facility]  4. Hyperlipidemia - Lipid panel  5. Vitamin D deficiency - Vit D  25 hydroxy (rtn osteoporosis monitoring)  6. Encounter for long-term (current) use of other medications - Magnesium   Plan:   During the course of the visit the patient was educated and counseled about appropriate screening and preventive services including:    Pneumococcal vaccine   Influenza vaccine  Td vaccine  Screening electrocardiogram  Colorectal cancer screening  Diabetes screening  Glaucoma screening  Nutrition counseling   Screening recommendations, referrals: Vaccinations: Tdap vaccine ordered does not want until next visit Influenza vaccine up to date Pneumococcal vaccine up to date Shingles vaccine declined Hep B vaccine not indicated  Nutrition assessed and recommended  Colonoscopy due 2016 Recommended yearly ophthalmology/optometry visit for glaucoma screening and checkup Recommended yearly dental visit for hygiene and checkup Advanced directives - given information  Conditions/risks identified: BMI: Discussed weight loss, diet, and increase physical activity.  Increase physical activity: AHA recommends 150 minutes of physical activity a week.  Medications reviewed Diabetes is not at goal, ACE/ARB therapy: Yes. Urinary Incontinence is not an issue: discussed non pharmacology and pharmacology options.  Fall risk: low-  discussed PT, home fall assessment, medications.    Subjective:  [name] is a 68 y.o. male who presents for Medicare Annual Wellness Visit and 3 month follow up for HTN, hyperlipidemia, diabetes, and vitamin D Def.  Date of last medicare wellness visit was is unknown.  His blood pressure has been controlled at home, today their BP is BP: 132/82 mmHg He does workout, walks with neighbor with some fatigue afterwards. He denies chest pain, shortness of breath, dizziness.  He is on cholesterol medication and denies myalgias. His cholesterol is not at goal. The cholesterol last visit was:   Lab Results  Component Value Date   CHOL 195 05/24/2013   HDL 45 05/24/2013   LDLCALC 454* 05/24/2013   TRIG 106 05/24/2013   CHOLHDL 4.3 05/24/2013   He has been working on diet and exercise for diabetes, and denies polydipsia, polyuria and visual disturbances. Last A1C in the office was:  Lab Results  Component Value Date   HGBA1C 6.5* 05/24/2013   Patient is on Vitamin D supplement.   Right inner arm with annular rash  Names of Other Physician/Practitioners you currently use: 1. [facility] here for primary care 2. Dr. Lorin Picket, eye doctor, last visit yearly 3. Dr. Ladona Ridgel last visit q 6 months Patient Care Team: Lucky Cowboy, MD as PCP - General (Internal Medicine) Kathleene Hazel, MD as Attending Physician (Cardiology)  Medication Review: Current Outpatient Prescriptions on File Prior to Visit  Medication Sig Dispense Refill  . acetaminophen (TYLENOL) 500 MG tablet Take 500 mg by mouth every 6 (six) hours as needed for pain.      . benazepril (LOTENSIN) 20 MG tablet Take 10 mg by mouth every evening.       . Cholecalciferol (VITAMIN D PO) Take by mouth daily. TAKING  5,000 UNITS 4 TIMES A WEEK AN 2,000 UNITS 3 TIMES A WEEK      . fenofibrate micronized (LOFIBRA) 134 MG capsule Take 134 mg by mouth every evening.       . fexofenadine (ALLEGRA) 30 MG  tablet Take 30 mg by mouth as needed.       . hydrocortisone (ANUSOL-HC) 2.5 % rectal cream Apply to hemorrhoids 4 x daily as needed  30 g  5  . loratadine (CLARITIN) 10 MG tablet Take 10 mg by mouth daily as needed for allergies.      Marland Kitchen warfarin (COUMADIN) 5 MG tablet TAKE AS DIRECTED BY ANTICOAGULATION CLINIC  160 tablet  0   No current facility-administered medications on file prior to visit.    Current Problems (verified) Patient Active Problem List   Diagnosis Date Noted  . Encounter for long-term (current) use of other medications 05/24/2013  . 2 NIDDM w/ CKD moderate (GFR 48)  05/24/2013  . Encounter for therapeutic drug monitoring 04/30/2013  . Hyperlipidemia   . Hypertension   . Seasonal allergies   . Osteoarthritis   . Vitamin D deficiency   . Degenerative joint disease   . S/P MVR (mitral valve repair) 08/28/2012  . S/P mitral valve repair 08/15/2012  . Severe mitral regurgitation 07/27/2012  . S/P right THA, AA 08/24/2011  . Right carotid bruit 06/14/2011  . Encounter for long-term (current) use of anticoagulants 11/05/2010  . Mitral valve disorders 08/12/2009    Screening Tests Health Maintenance  Topic Date Due  . Zostavax  08/22/2006  . Influenza Vaccine  10/20/2013  . Tetanus/tdap  11/21/2013  . Colonoscopy  04/23/2019  . Pneumococcal Polysaccharide Vaccine Age 66 And Over  Completed    Immunization History  Administered Date(s) Administered  . Influenza-Unspecified 01/20/2013  . Pneumococcal-Unspecified 05/11/2012  . Td 11/22/2003    Preventative care: Last colonoscopy: 2011 DUE 2016  Prior vaccinations: TD or Tdap: 2005 DUE now Influenza: 2014 Pneumococcal: 2014 Shingles/Zostavax: declines  History reviewed: allergies, current medications, past family history, past medical history, past social history, past surgical history and problem list   Risk Factors: Tobacco History  Substance Use Topics  . Smoking status: Former Smoker    Types:  Cigars    Quit date: 03/22/1973  . Smokeless tobacco: Former Neurosurgeon    Types: Chew    Quit date: 08/15/2012  . Alcohol Use: No   He does not smoke.  Patient is a former smoker. Are there smokers in your home (other than you)?  No  Alcohol Current alcohol use: none  Caffeine Current caffeine use: coffee 1 /day  Exercise Current exercise: none  Nutrition/Diet Current diet: in general, a "healthy" diet    Cardiac risk factors: advanced age (older than 11 for men, 32 for women), diabetes mellitus, dyslipidemia, hypertension, male gender, obesity (BMI >= 30 kg/m2) and sedentary lifestyle.  Depression Screen (Note: if answer to either of the following is "Yes", a more complete depression screening is indicated)   Q1: Over the past two weeks, have you felt down, depressed or hopeless? No  Q2: Over the past two weeks, have you felt little interest or pleasure in doing things? No  Have you lost interest or pleasure in daily life? No  Do you often feel hopeless? No  Do you cry easily over simple problems? No  Activities of Daily Living In your present state of health, do you have any difficulty performing the following activities?:  Driving? No Managing money?  No Feeding yourself? No Getting from bed to chair? No Climbing a flight of stairs? No Preparing food and eating?: No Bathing or showering? No Getting dressed: No Getting to the toilet? No Using the toilet:No Moving around from place to place: No In the past year have you fallen or had a near fall?:No   Are you sexually active?  No  Do you have more than one partner?  No  Vision Difficulties: No  Hearing Difficulties: No Do you often ask people to speak up or repeat themselves? No Do you experience ringing or noises in your ears? No Do you have difficulty understanding soft or whispered voices? No  Cognition  Do you feel that you have a problem with memory?No  Do you often misplace items? No  Do you feel safe at  home?  Yes  Advanced directives Does patient have a Health Care Power of Attorney? No Does patient have a Living Will? No   Objective:   Blood pressure 132/82, pulse 60, temperature 97.9 F (36.6 C), resp. rate 16, weight 234 lb (106.142 kg). Body mass index is 31.28 kg/(m^2).  General appearance: alert, no distress, WD/WN, male Cognitive Testing  Alert? Yes  Normal Appearance?Yes  Oriented to person? Yes  Place? Yes   Time? Yes  Recall of three objects?  Yes  Can perform simple calculations? Yes  Displays appropriate judgment?Yes  Can read the correct time from a watch face?Yes  HEENT: normocephalic, sclerae anicteric, TMs pearly, nares patent, no discharge or erythema, pharynx normal Oral cavity: MMM, no lesions Neck: supple, no lymphadenopathy, no thyromegaly, no masses Heart: RRR, normal S1, S2, no murmurs Lungs: CTA bilaterally, no wheezes, rhonchi, or rales Abdomen: +bs, soft, non tender, non distended, no masses, no hepatomegaly, no splenomegaly Musculoskeletal: nontender, no swelling, no obvious deformity Extremities: no edema, no cyanosis, no clubbing Pulses: 2+ symmetric, upper and lower extremities, normal cap refill Neurological: alert, oriented x 3, CN2-12 intact, strength normal upper extremities and lower extremities, sensation normal throughout, DTRs 2+ throughout, no cerebellar signs, gait normal Psychiatric: normal affect, behavior normal, pleasant   Medicare Attestation I have personally reviewed: The patient's medical and social history Their use of alcohol, tobacco or illicit drugs Their current medications and supplements The patient's functional ability including ADLs,fall risks, home safety risks, cognitive, and hearing and visual impairment Diet and physical activities Evidence for depression or mood disorders  The patient's weight, height, BMI, and visual acuity have been recorded in the chart.  I have made referrals, counseling, and provided  education to the patient based on review of the above and I have provided the patient with a written personalized care plan for preventive services.     Quentin Mullingmanda Jontae Adebayo, PA-C   08/24/2013

## 2013-08-31 ENCOUNTER — Other Ambulatory Visit: Payer: 59

## 2013-08-31 LAB — CBC WITH DIFFERENTIAL/PLATELET
BASOS ABS: 0 10*3/uL (ref 0.0–0.1)
BASOS PCT: 1 % (ref 0–1)
Eosinophils Absolute: 0.1 10*3/uL (ref 0.0–0.7)
Eosinophils Relative: 2 % (ref 0–5)
HEMATOCRIT: 44.7 % (ref 39.0–52.0)
Hemoglobin: 15.7 g/dL (ref 13.0–17.0)
Lymphocytes Relative: 43 % (ref 12–46)
Lymphs Abs: 1.7 10*3/uL (ref 0.7–4.0)
MCH: 31.2 pg (ref 26.0–34.0)
MCHC: 35.1 g/dL (ref 30.0–36.0)
MCV: 88.7 fL (ref 78.0–100.0)
MONO ABS: 0.3 10*3/uL (ref 0.1–1.0)
Monocytes Relative: 8 % (ref 3–12)
NEUTROS ABS: 1.8 10*3/uL (ref 1.7–7.7)
Neutrophils Relative %: 46 % (ref 43–77)
Platelets: 232 10*3/uL (ref 150–400)
RBC: 5.04 MIL/uL (ref 4.22–5.81)
RDW: 14 % (ref 11.5–15.5)
WBC: 3.9 10*3/uL — AB (ref 4.0–10.5)

## 2013-08-31 LAB — TSH: TSH: 1.065 u[IU]/mL (ref 0.350–4.500)

## 2013-08-31 LAB — HEPATIC FUNCTION PANEL
ALK PHOS: 43 U/L (ref 39–117)
ALT: 20 U/L (ref 0–53)
AST: 27 U/L (ref 0–37)
Albumin: 4.7 g/dL (ref 3.5–5.2)
BILIRUBIN DIRECT: 0.1 mg/dL (ref 0.0–0.3)
BILIRUBIN INDIRECT: 0.5 mg/dL (ref 0.2–1.2)
BILIRUBIN TOTAL: 0.6 mg/dL (ref 0.2–1.2)
Total Protein: 7.1 g/dL (ref 6.0–8.3)

## 2013-08-31 LAB — LIPID PANEL
CHOL/HDL RATIO: 3.9 ratio
Cholesterol: 174 mg/dL (ref 0–200)
HDL: 45 mg/dL (ref >39)
LDL CALC: 110 mg/dL — AB (ref 0–99)
TRIGLYCERIDES: 97 mg/dL (ref <150)
VLDL: 19 mg/dL (ref 0–40)

## 2013-08-31 LAB — BASIC METABOLIC PANEL WITH GFR
BUN: 19 mg/dL (ref 6–23)
CHLORIDE: 101 meq/L (ref 96–112)
CO2: 24 mEq/L (ref 19–32)
Calcium: 9.3 mg/dL (ref 8.4–10.5)
Creat: 1.46 mg/dL — ABNORMAL HIGH (ref 0.50–1.35)
GFR, EST AFRICAN AMERICAN: 57 mL/min — AB
GFR, EST NON AFRICAN AMERICAN: 49 mL/min — AB
Glucose, Bld: 120 mg/dL — ABNORMAL HIGH (ref 70–99)
Potassium: 4.3 mEq/L (ref 3.5–5.3)
SODIUM: 138 meq/L (ref 135–145)

## 2013-08-31 LAB — MAGNESIUM: Magnesium: 1.8 mg/dL (ref 1.5–2.5)

## 2013-08-31 LAB — VITAMIN D 25 HYDROXY (VIT D DEFICIENCY, FRACTURES): Vit D, 25-Hydroxy: 49 ng/mL (ref 30–89)

## 2013-08-31 LAB — HEMOGLOBIN A1C
Hgb A1c MFr Bld: 6.6 % — ABNORMAL HIGH (ref <5.7)
Mean Plasma Glucose: 143 mg/dL — ABNORMAL HIGH (ref <117)

## 2013-09-01 LAB — INSULIN, FASTING: Insulin fasting, serum: 35 u[IU]/mL — ABNORMAL HIGH (ref 3–28)

## 2013-09-03 ENCOUNTER — Ambulatory Visit (INDEPENDENT_AMBULATORY_CARE_PROVIDER_SITE_OTHER): Payer: 59 | Admitting: *Deleted

## 2013-09-03 DIAGNOSIS — I2699 Other pulmonary embolism without acute cor pulmonale: Secondary | ICD-10-CM

## 2013-09-03 DIAGNOSIS — Z7901 Long term (current) use of anticoagulants: Secondary | ICD-10-CM

## 2013-09-03 DIAGNOSIS — Z5181 Encounter for therapeutic drug level monitoring: Secondary | ICD-10-CM

## 2013-09-03 DIAGNOSIS — I340 Nonrheumatic mitral (valve) insufficiency: Secondary | ICD-10-CM

## 2013-09-03 DIAGNOSIS — Z9889 Other specified postprocedural states: Secondary | ICD-10-CM

## 2013-09-03 DIAGNOSIS — I059 Rheumatic mitral valve disease, unspecified: Secondary | ICD-10-CM

## 2013-09-03 LAB — POCT INR: INR: 2.9

## 2013-09-24 ENCOUNTER — Other Ambulatory Visit: Payer: Self-pay | Admitting: Cardiovascular Disease

## 2013-10-15 ENCOUNTER — Ambulatory Visit (INDEPENDENT_AMBULATORY_CARE_PROVIDER_SITE_OTHER): Payer: 59

## 2013-10-15 DIAGNOSIS — I340 Nonrheumatic mitral (valve) insufficiency: Secondary | ICD-10-CM

## 2013-10-15 DIAGNOSIS — I2699 Other pulmonary embolism without acute cor pulmonale: Secondary | ICD-10-CM | POA: Diagnosis not present

## 2013-10-15 DIAGNOSIS — Z5181 Encounter for therapeutic drug level monitoring: Secondary | ICD-10-CM | POA: Diagnosis not present

## 2013-10-15 DIAGNOSIS — Z7901 Long term (current) use of anticoagulants: Secondary | ICD-10-CM | POA: Diagnosis not present

## 2013-10-15 DIAGNOSIS — Z9889 Other specified postprocedural states: Secondary | ICD-10-CM

## 2013-10-15 DIAGNOSIS — I059 Rheumatic mitral valve disease, unspecified: Secondary | ICD-10-CM

## 2013-10-15 LAB — POCT INR: INR: 3.5

## 2013-11-01 ENCOUNTER — Other Ambulatory Visit: Payer: Self-pay | Admitting: Emergency Medicine

## 2013-11-05 ENCOUNTER — Ambulatory Visit (INDEPENDENT_AMBULATORY_CARE_PROVIDER_SITE_OTHER): Payer: 59 | Admitting: Pharmacist

## 2013-11-05 DIAGNOSIS — Z5181 Encounter for therapeutic drug level monitoring: Secondary | ICD-10-CM | POA: Diagnosis not present

## 2013-11-05 DIAGNOSIS — Z9889 Other specified postprocedural states: Secondary | ICD-10-CM

## 2013-11-05 DIAGNOSIS — I340 Nonrheumatic mitral (valve) insufficiency: Secondary | ICD-10-CM

## 2013-11-05 DIAGNOSIS — Z7901 Long term (current) use of anticoagulants: Secondary | ICD-10-CM | POA: Diagnosis not present

## 2013-11-05 DIAGNOSIS — I059 Rheumatic mitral valve disease, unspecified: Secondary | ICD-10-CM

## 2013-11-05 DIAGNOSIS — I2699 Other pulmonary embolism without acute cor pulmonale: Secondary | ICD-10-CM

## 2013-11-05 LAB — POCT INR: INR: 2.7

## 2013-11-27 ENCOUNTER — Ambulatory Visit: Payer: Self-pay | Admitting: Internal Medicine

## 2013-12-06 ENCOUNTER — Encounter: Payer: Self-pay | Admitting: Internal Medicine

## 2013-12-10 ENCOUNTER — Ambulatory Visit (INDEPENDENT_AMBULATORY_CARE_PROVIDER_SITE_OTHER): Payer: 59 | Admitting: *Deleted

## 2013-12-10 DIAGNOSIS — Z5181 Encounter for therapeutic drug level monitoring: Secondary | ICD-10-CM | POA: Diagnosis not present

## 2013-12-10 DIAGNOSIS — I059 Rheumatic mitral valve disease, unspecified: Secondary | ICD-10-CM

## 2013-12-10 DIAGNOSIS — I2699 Other pulmonary embolism without acute cor pulmonale: Secondary | ICD-10-CM

## 2013-12-10 DIAGNOSIS — Z9889 Other specified postprocedural states: Secondary | ICD-10-CM

## 2013-12-10 DIAGNOSIS — I340 Nonrheumatic mitral (valve) insufficiency: Secondary | ICD-10-CM

## 2013-12-10 DIAGNOSIS — Z7901 Long term (current) use of anticoagulants: Secondary | ICD-10-CM | POA: Diagnosis not present

## 2013-12-10 LAB — POCT INR: INR: 3.2

## 2013-12-20 ENCOUNTER — Ambulatory Visit (INDEPENDENT_AMBULATORY_CARE_PROVIDER_SITE_OTHER): Payer: 59 | Admitting: Internal Medicine

## 2013-12-20 ENCOUNTER — Encounter: Payer: Self-pay | Admitting: Internal Medicine

## 2013-12-20 VITALS — BP 120/84 | HR 68 | Temp 98.1°F | Resp 16 | Ht 72.0 in | Wt 233.2 lb

## 2013-12-20 DIAGNOSIS — Z5181 Encounter for therapeutic drug level monitoring: Secondary | ICD-10-CM

## 2013-12-20 DIAGNOSIS — N183 Chronic kidney disease, stage 3 unspecified: Secondary | ICD-10-CM

## 2013-12-20 DIAGNOSIS — E559 Vitamin D deficiency, unspecified: Secondary | ICD-10-CM

## 2013-12-20 DIAGNOSIS — E782 Mixed hyperlipidemia: Secondary | ICD-10-CM

## 2013-12-20 DIAGNOSIS — E785 Hyperlipidemia, unspecified: Secondary | ICD-10-CM

## 2013-12-20 DIAGNOSIS — Z23 Encounter for immunization: Secondary | ICD-10-CM

## 2013-12-20 DIAGNOSIS — Z79899 Other long term (current) drug therapy: Secondary | ICD-10-CM

## 2013-12-20 DIAGNOSIS — R7309 Other abnormal glucose: Secondary | ICD-10-CM

## 2013-12-20 DIAGNOSIS — E1122 Type 2 diabetes mellitus with diabetic chronic kidney disease: Secondary | ICD-10-CM

## 2013-12-20 DIAGNOSIS — I10 Essential (primary) hypertension: Secondary | ICD-10-CM

## 2013-12-20 LAB — HEMOGLOBIN A1C
Hgb A1c MFr Bld: 7.1 % — ABNORMAL HIGH (ref <5.7)
MEAN PLASMA GLUCOSE: 157 mg/dL — AB (ref <117)

## 2013-12-20 LAB — CBC WITH DIFFERENTIAL/PLATELET
Basophils Absolute: 0 10*3/uL (ref 0.0–0.1)
Basophils Relative: 1 % (ref 0–1)
EOS PCT: 3 % (ref 0–5)
Eosinophils Absolute: 0.1 10*3/uL (ref 0.0–0.7)
HCT: 41.5 % (ref 39.0–52.0)
Hemoglobin: 14.7 g/dL (ref 13.0–17.0)
LYMPHS ABS: 1.7 10*3/uL (ref 0.7–4.0)
LYMPHS PCT: 42 % (ref 12–46)
MCH: 30.4 pg (ref 26.0–34.0)
MCHC: 35.4 g/dL (ref 30.0–36.0)
MCV: 85.9 fL (ref 78.0–100.0)
MONO ABS: 0.3 10*3/uL (ref 0.1–1.0)
Monocytes Relative: 7 % (ref 3–12)
Neutro Abs: 1.9 10*3/uL (ref 1.7–7.7)
Neutrophils Relative %: 47 % (ref 43–77)
Platelets: 260 10*3/uL (ref 150–400)
RBC: 4.83 MIL/uL (ref 4.22–5.81)
RDW: 14.3 % (ref 11.5–15.5)
WBC: 4 10*3/uL (ref 4.0–10.5)

## 2013-12-20 MED ORDER — OXYBUTYNIN CHLORIDE 5 MG PO TABS: 5.0000 mg | ORAL_TABLET | Freq: Three times a day (TID) | ORAL | Status: DC

## 2013-12-20 MED ORDER — OXYBUTYNIN CHLORIDE 5 MG PO TABS: 5.0000 mg | ORAL_TABLET | Freq: Three times a day (TID) | ORAL | Status: AC

## 2013-12-20 NOTE — Progress Notes (Signed)
Patient ID: [name], male   DOB: 10/20/1945, 68 y.o.   MRN: [id number]   This very nice 68 y.o.MBM presents for 3 month follow up with Hypertension, Hyperlipidemia, T2_NIDDM w/ CKD3 and Vitamin D Deficiency. Patient is on coumadin for recurrent PE and is [facility].P:atient is also s/p Mitral valvuloplasty (2014).   Patient is treated for HTN & BP has been controlled at home. Today's BP: 120/84 mmHg. Patient has had no complaints of any cardiac type chest pain, palpitations, dyspnea/orthopnea/PND, dizziness, claudication, or dependent edema.  Hyperlipidemia is controlled with diet & meds. Patient denies myalgias or other med SE's. Last Lipids were Total Chol 174; HDL  45; LDL 110*; Trig 97- not at goal on 08/31/2013.   Also, the patient has history of diet controlledT2_NIDDM (2002) w/ CKD3  and has had no symptoms of reactive hypoglycemia, diabetic polys, paresthesias or visual blurring.  Last A1c was  6.6% on 08/31/2013.    Further, the patient also has history of Vitamin D Deficiency and supplements vitamin D without any suspected side-effects. Last vitamin D was  on 08/31/2013.    Medication List   acetaminophen 500 MG tablet  Commonly known as:  TYLENOL  Take 500 mg by mouth every 6 (six) hours as needed for pain.     benazepril 20 MG tablet  Commonly known as:  LOTENSIN  TAKE 1 TABLET BY MOUTH ONCE DAILY     clotrimazole-betamethasone cream  Commonly known as:  LOTRISONE  Apply 1 application topically 2 (two) times daily. PRN     fenofibrate micronized 134 MG capsule  Commonly known as:  LOFIBRA  Take 134 mg by mouth every evening.     fexofenadine 30 MG tablet  Commonly known as:  ALLEGRA  Take 30 mg by mouth as needed.     hydrocortisone 2.5 % rectal cream  Commonly known as:  ANUSOL-HC  Apply to hemorrhoids 4 x daily as needed     loratadine 10 MG tablet  Commonly known as:  CLARITIN  Take 10 mg by mouth daily as needed for allergies.      oxybutynin 5 MG tablet  Commonly known as:  DITROPAN  Take 1 tablet (5 mg total) by mouth 3 (three) times daily. For bladder spasms     VITAMIN D PO  Take by mouth daily. TAKING 5,000 UNITS 4 TIMES A WEEK AN 2,000 UNITS 3 TIMES A WEEK     warfarin 5 MG tablet  Commonly known as:  COUMADIN  2 tablets daily except 1 tablet on Tuesdays, Thursdays and Saturdays or as directed by coumadin clinic     Allergies  Allergen Reactions  . Statins     REACTION: muscle aches  . Welchol [Colesevelam Hcl]    PMHx:   Past Medical History  Diagnosis Date  . Pulmonary embolism     march 2010, 2012              /   DVT x  2  LEFT 2010  . Coronary artery disease     non-obstructive by cath 2009  . Pre-diabetes   . Peripheral vascular disease   . Severe mitral regurgitation by prior echocardiogram   . Sleep apnea     STOP BANG SCORE 4  . S/P mitral valve repair 08/15/2012    Complex valvuloplasty including triangular resection of posterior leaflet, artificial Gore-tex neocord placement x4 and Sorin Memo 3D ring annuloplasty via right mini thoracotomy approach  .  Hyperlipidemia   . Hypertension     Clearance with note Dr Marvel Plan on chart,  Lovonox bridging  ordered by Dr Sanjuana Kava    EKG 9/12, chst 9/12 EPIC  . Heart murmur   . Seasonal allergies   . Osteoarthritis   . Vitamin D deficiency   . Degenerative joint disease    FHx:    Reviewed / unchanged  SHx:    Reviewed / unchanged   Systems Review:  Constitutional: Denies fever, chills, wt changes, headaches, insomnia, fatigue, night sweats, change in appetite. Eyes: Denies redness, blurred vision, diplopia, discharge, itchy, watery eyes.  ENT: Denies discharge, congestion, post nasal drip, epistaxis, sore throat, earache, hearing loss, dental pain, tinnitus, vertigo, sinus pain, snoring.  CV: Denies chest pain, palpitations, irregular heartbeat, syncope, dyspnea, diaphoresis, orthopnea, PND, claudication or edema. Respiratory: denies  cough, dyspnea, DOE, pleurisy, hoarseness, laryngitis, wheezing.  Gastrointestinal: Denies dysphagia, odynophagia, heartburn, reflux, water brash, abdominal pain or cramps, nausea, vomiting, bloating, diarrhea, constipation, hematemesis, melena, hematochezia  or hemorrhoids. Genitourinary: Denies dysuria, frequency, urgency, nocturia, hesitancy, discharge, hematuria or flank pain. Musculoskeletal: Denies arthralgias, myalgias, stiffness, jt. swelling, pain, limping or strain/sprain.  Skin: Denies pruritus, rash, hives, warts, acne, eczema or change in skin lesion(s). Neuro: No weakness, tremor, incoordination, spasms, paresthesia or pain. Psychiatric: Denies confusion, memory loss or sensory loss. Endo: Denies change in weight, skin or hair change.  Heme/Lymph: No excessive bleeding, bruising or enlarged lymph nodes.  Exam:  BP 120/84  Pulse 68  Temp(Src) 98.1 F (36.7 C) (Temporal)  Resp 16  Ht 6' (1.829 m)  Wt 233 lb 3.2 oz (105.779 kg)  BMI 31.62 kg/m2  Appears well nourished and in no distress. Eyes: PERRLA, EOMs, conjunctiva no swelling or erythema. Sinuses: No frontal/maxillary tenderness ENT/Mouth: EAC's clear, TM's nl w/o erythema, bulging. Nares clear w/o erythema, swelling, exudates. Oropharynx clear without erythema or exudates. Oral hygiene is good. Tongue normal, non obstructing. Hearing intact.  Neck: Supple. Thyroid nl. Car 2+/2+ without bruits, nodes or JVD. Chest: Respirations nl with BS clear & equal w/o rales, rhonchi, wheezing or stridor.  Cor: Heart sounds normal w/ regular rate and rhythm without sig. murmurs, gallops, clicks, or rubs. Peripheral pulses normal and equal  without edema.  Abdomen: Soft & bowel sounds normal. Non-tender w/o guarding, rebound, hernias, masses, or organomegaly.  Lymphatics: Unremarkable.  Musculoskeletal: Full ROM all peripheral extremities, joint stability, 5/5 strength, and normal gait.  Skin: Warm, dry without exposed rashes,  lesions or ecchymosis apparent.  Neuro: Cranial nerves intact, reflexes equal bilaterally. Sensory-motor testing grossly intact. Tendon reflexes grossly intact.  Pysch: Alert & oriented x 3.  Insight and judgement nl & appropriate. No ideations.  Assessment and Plan:  1. Hypertension - Continue monitor blood pressure at home. Continue diet/meds same.  2. Hyperlipidemia - Continue diet/meds, exercise,& lifestyle modifications. Continue monitor periodic cholesterol/liver & renal functions   3. T2_NIDDM w/CKD3- Continue diet, exercise, lifestyle modifications. Monitor appropriate labs.  4. Vitamin D Deficiency - Continue supplementation.   Recommended regular exercise, BP monitoring, weight control, and discussed med and SE's. Recommended labs to assess and monitor clinical status. Further disposition pending results of labs.

## 2013-12-20 NOTE — Patient Instructions (Signed)

## 2013-12-21 LAB — LIPID PANEL
CHOLESTEROL: 171 mg/dL (ref 0–200)
HDL: 42 mg/dL (ref >39)
LDL CALC: 112 mg/dL — AB (ref 0–99)
TRIGLYCERIDES: 87 mg/dL (ref <150)
Total CHOL/HDL Ratio: 4.1 Ratio
VLDL: 17 mg/dL (ref 0–40)

## 2013-12-21 LAB — BASIC METABOLIC PANEL WITH GFR
BUN: 17 mg/dL (ref 6–23)
CHLORIDE: 104 meq/L (ref 96–112)
CO2: 24 meq/L (ref 19–32)
CREATININE: 1.38 mg/dL — AB (ref 0.50–1.35)
Calcium: 9.4 mg/dL (ref 8.4–10.5)
GFR, Est African American: 61 mL/min
GFR, Est Non African American: 53 mL/min — ABNORMAL LOW
GLUCOSE: 125 mg/dL — AB (ref 70–99)
Potassium: 4.2 mEq/L (ref 3.5–5.3)
Sodium: 138 mEq/L (ref 135–145)

## 2013-12-21 LAB — HEPATIC FUNCTION PANEL
ALBUMIN: 4.4 g/dL (ref 3.5–5.2)
ALT: 17 U/L (ref 0–53)
AST: 24 U/L (ref 0–37)
Alkaline Phosphatase: 42 U/L (ref 39–117)
Bilirubin, Direct: 0.1 mg/dL (ref 0.0–0.3)
Indirect Bilirubin: 0.5 mg/dL (ref 0.2–1.2)
Total Bilirubin: 0.6 mg/dL (ref 0.2–1.2)
Total Protein: 7 g/dL (ref 6.0–8.3)

## 2013-12-21 LAB — VITAMIN D 25 HYDROXY (VIT D DEFICIENCY, FRACTURES): Vit D, 25-Hydroxy: 43 ng/mL (ref 30–89)

## 2013-12-21 LAB — TSH: TSH: 1.135 u[IU]/mL (ref 0.350–4.500)

## 2013-12-21 LAB — MAGNESIUM: Magnesium: 1.7 mg/dL (ref 1.5–2.5)

## 2013-12-21 LAB — INSULIN, FASTING: Insulin fasting, serum: 12.7 u[IU]/mL (ref 2.0–19.6)

## 2013-12-31 ENCOUNTER — Ambulatory Visit (INDEPENDENT_AMBULATORY_CARE_PROVIDER_SITE_OTHER): Payer: 59

## 2013-12-31 DIAGNOSIS — I340 Nonrheumatic mitral (valve) insufficiency: Secondary | ICD-10-CM | POA: Diagnosis not present

## 2013-12-31 DIAGNOSIS — Z9889 Other specified postprocedural states: Secondary | ICD-10-CM | POA: Diagnosis not present

## 2013-12-31 DIAGNOSIS — Z7901 Long term (current) use of anticoagulants: Secondary | ICD-10-CM | POA: Diagnosis not present

## 2013-12-31 DIAGNOSIS — I2699 Other pulmonary embolism without acute cor pulmonale: Secondary | ICD-10-CM | POA: Diagnosis not present

## 2013-12-31 DIAGNOSIS — Z5181 Encounter for therapeutic drug level monitoring: Secondary | ICD-10-CM | POA: Diagnosis not present

## 2013-12-31 LAB — POCT INR: INR: 2.3

## 2014-01-28 ENCOUNTER — Ambulatory Visit (INDEPENDENT_AMBULATORY_CARE_PROVIDER_SITE_OTHER): Payer: 59

## 2014-01-28 DIAGNOSIS — Z9889 Other specified postprocedural states: Secondary | ICD-10-CM | POA: Diagnosis not present

## 2014-01-28 DIAGNOSIS — Z5181 Encounter for therapeutic drug level monitoring: Secondary | ICD-10-CM

## 2014-01-28 DIAGNOSIS — I2699 Other pulmonary embolism without acute cor pulmonale: Secondary | ICD-10-CM | POA: Diagnosis not present

## 2014-01-28 DIAGNOSIS — Z7901 Long term (current) use of anticoagulants: Secondary | ICD-10-CM | POA: Diagnosis not present

## 2014-01-28 DIAGNOSIS — I340 Nonrheumatic mitral (valve) insufficiency: Secondary | ICD-10-CM

## 2014-01-28 LAB — POCT INR: INR: 2.3

## 2014-03-04 ENCOUNTER — Ambulatory Visit (INDEPENDENT_AMBULATORY_CARE_PROVIDER_SITE_OTHER): Payer: 59 | Admitting: Surgery

## 2014-03-04 DIAGNOSIS — I340 Nonrheumatic mitral (valve) insufficiency: Secondary | ICD-10-CM

## 2014-03-04 DIAGNOSIS — Z7901 Long term (current) use of anticoagulants: Secondary | ICD-10-CM | POA: Diagnosis not present

## 2014-03-04 DIAGNOSIS — Z9889 Other specified postprocedural states: Secondary | ICD-10-CM | POA: Diagnosis not present

## 2014-03-04 DIAGNOSIS — Z5181 Encounter for therapeutic drug level monitoring: Secondary | ICD-10-CM | POA: Diagnosis not present

## 2014-03-04 DIAGNOSIS — I2699 Other pulmonary embolism without acute cor pulmonale: Secondary | ICD-10-CM

## 2014-03-04 LAB — POCT INR: INR: 2.4

## 2014-03-06 ENCOUNTER — Other Ambulatory Visit: Payer: Self-pay | Admitting: *Deleted

## 2014-03-06 MED ORDER — FENOFIBRATE MICRONIZED 134 MG PO CAPS: 134.0000 mg | ORAL_CAPSULE | Freq: Every evening | ORAL | Status: DC

## 2014-04-15 ENCOUNTER — Other Ambulatory Visit: Payer: Self-pay | Admitting: Cardiovascular Disease

## 2014-04-18 ENCOUNTER — Ambulatory Visit (INDEPENDENT_AMBULATORY_CARE_PROVIDER_SITE_OTHER): Payer: 59 | Admitting: Pharmacist Clinician (PhC)/ Clinical Pharmacy Specialist

## 2014-04-18 DIAGNOSIS — Z7901 Long term (current) use of anticoagulants: Secondary | ICD-10-CM | POA: Diagnosis not present

## 2014-04-18 DIAGNOSIS — Z5181 Encounter for therapeutic drug level monitoring: Secondary | ICD-10-CM

## 2014-04-18 DIAGNOSIS — Z9889 Other specified postprocedural states: Secondary | ICD-10-CM

## 2014-04-18 DIAGNOSIS — I2699 Other pulmonary embolism without acute cor pulmonale: Secondary | ICD-10-CM | POA: Diagnosis not present

## 2014-04-18 DIAGNOSIS — I340 Nonrheumatic mitral (valve) insufficiency: Secondary | ICD-10-CM

## 2014-04-18 LAB — POCT INR: INR: 3

## 2014-04-23 IMAGING — CR DG CHEST 2V
2 series · 2 of 2 positions shown · non-contrast
Comparison: None.

CLINICAL DATA: Annual physical.  Hypertension.  Previous pulmonary
embolism.

CHEST - 2 VIEW

[view not recorded (1 of 2)]
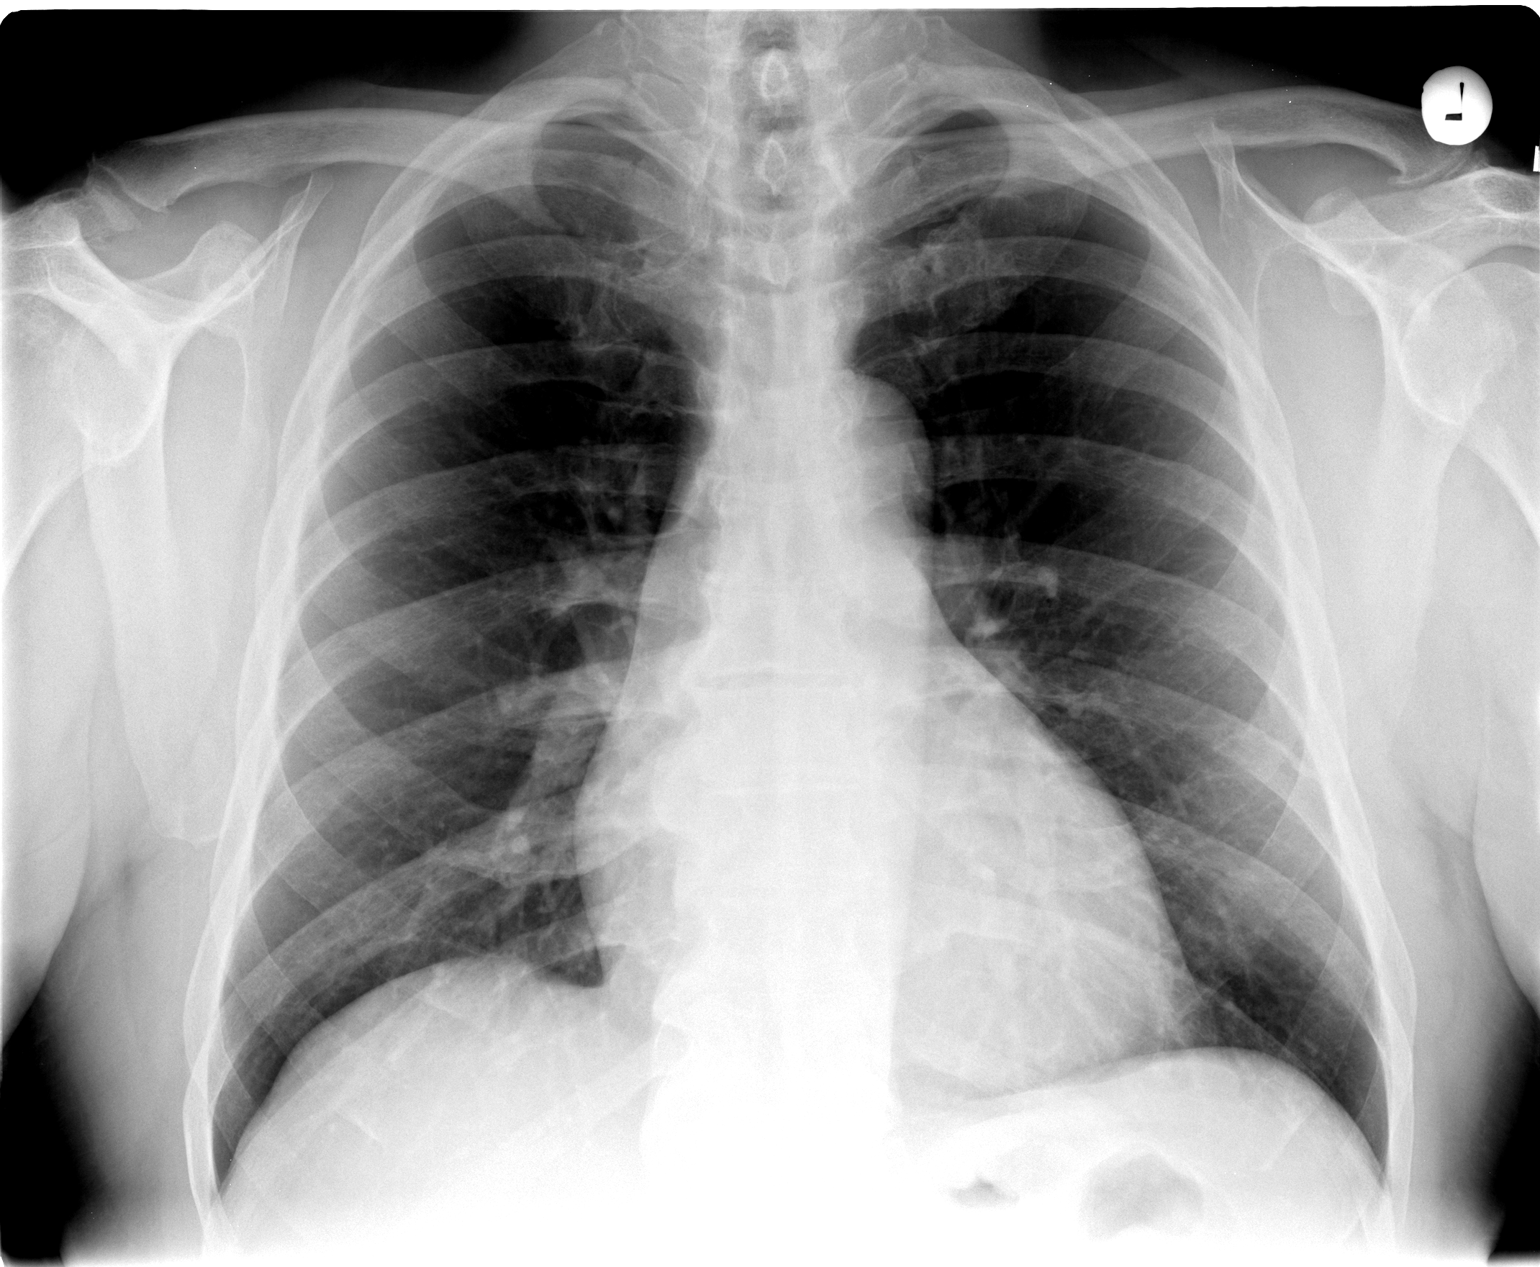

[view not recorded (2 of 2)]
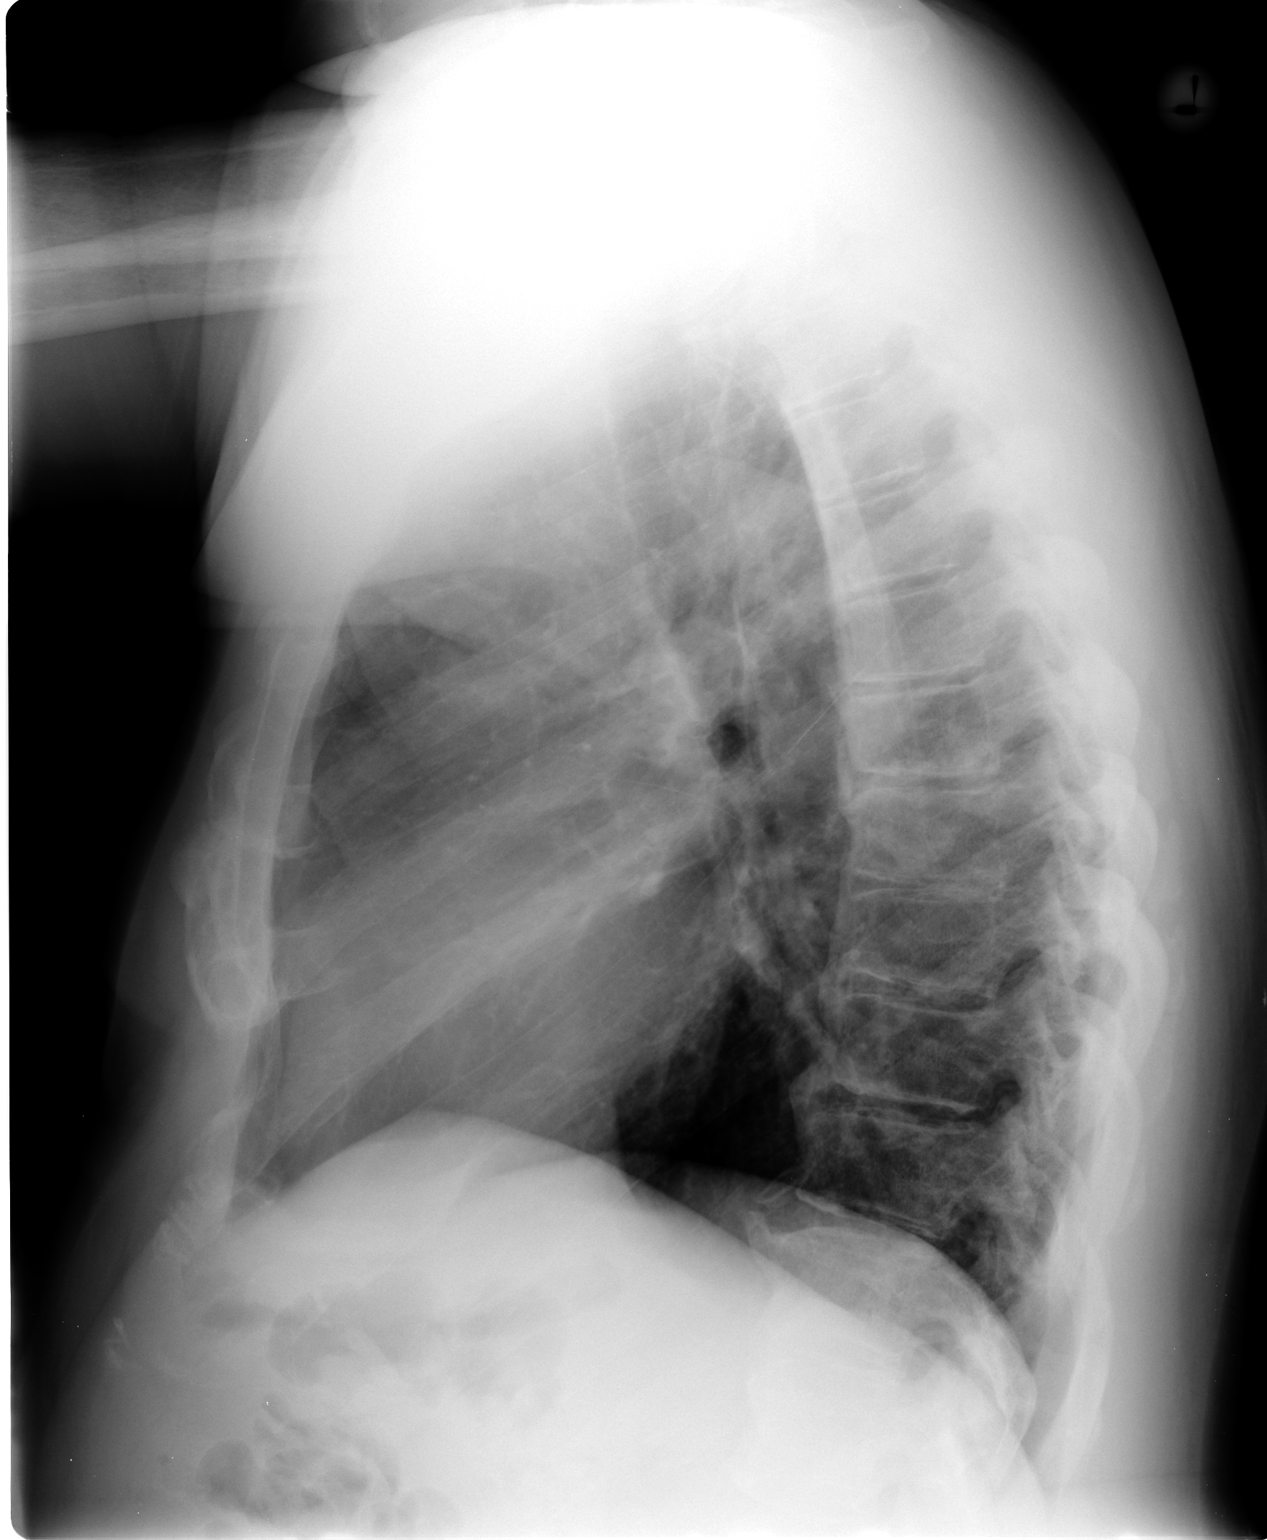

[2 of 2 positions shown; findings below may reference images not displayed]

FINDINGS: The heart size remains at the upper limits of normal.
Mediastinal contours are within normal limits.  Both lungs are
clear.  Mild lower thoracic spine degenerative changes again noted.
IMPRESSION: No active cardiopulmonary disease.

## 2014-04-24 ENCOUNTER — Encounter: Payer: Self-pay | Admitting: Physician Assistant

## 2014-04-24 ENCOUNTER — Ambulatory Visit (INDEPENDENT_AMBULATORY_CARE_PROVIDER_SITE_OTHER): Payer: 59 | Admitting: Physician Assistant

## 2014-04-24 VITALS — BP 138/78 | HR 56 | Temp 97.9°F | Resp 16 | Ht 73.0 in | Wt 224.0 lb

## 2014-04-24 DIAGNOSIS — E559 Vitamin D deficiency, unspecified: Secondary | ICD-10-CM

## 2014-04-24 DIAGNOSIS — N183 Chronic kidney disease, stage 3 (moderate): Secondary | ICD-10-CM

## 2014-04-24 DIAGNOSIS — E1122 Type 2 diabetes mellitus with diabetic chronic kidney disease: Secondary | ICD-10-CM

## 2014-04-24 DIAGNOSIS — E669 Obesity, unspecified: Secondary | ICD-10-CM

## 2014-04-24 DIAGNOSIS — E785 Hyperlipidemia, unspecified: Secondary | ICD-10-CM

## 2014-04-24 DIAGNOSIS — E663 Overweight: Secondary | ICD-10-CM | POA: Insufficient documentation

## 2014-04-24 DIAGNOSIS — Z5181 Encounter for therapeutic drug level monitoring: Secondary | ICD-10-CM

## 2014-04-24 DIAGNOSIS — I340 Nonrheumatic mitral (valve) insufficiency: Secondary | ICD-10-CM

## 2014-04-24 DIAGNOSIS — I10 Essential (primary) hypertension: Secondary | ICD-10-CM

## 2014-04-24 DIAGNOSIS — Z9889 Other specified postprocedural states: Secondary | ICD-10-CM

## 2014-04-24 LAB — BASIC METABOLIC PANEL WITH GFR
BUN: 19 mg/dL (ref 6–23)
CO2: 27 meq/L (ref 19–32)
Calcium: 9.8 mg/dL (ref 8.4–10.5)
Chloride: 103 mEq/L (ref 96–112)
Creat: 1.39 mg/dL — ABNORMAL HIGH (ref 0.50–1.35)
GFR, Est African American: 60 mL/min
GFR, Est Non African American: 52 mL/min — ABNORMAL LOW
Glucose, Bld: 104 mg/dL — ABNORMAL HIGH (ref 70–99)
Potassium: 4.3 mEq/L (ref 3.5–5.3)
Sodium: 138 mEq/L (ref 135–145)

## 2014-04-24 LAB — HEPATIC FUNCTION PANEL
ALBUMIN: 4.3 g/dL (ref 3.5–5.2)
ALK PHOS: 49 U/L (ref 39–117)
ALT: 18 U/L (ref 0–53)
AST: 24 U/L (ref 0–37)
BILIRUBIN INDIRECT: 0.5 mg/dL (ref 0.2–1.2)
BILIRUBIN TOTAL: 0.6 mg/dL (ref 0.2–1.2)
Bilirubin, Direct: 0.1 mg/dL (ref 0.0–0.3)
Total Protein: 7.2 g/dL (ref 6.0–8.3)

## 2014-04-24 LAB — CBC WITH DIFFERENTIAL/PLATELET
BASOS ABS: 0 10*3/uL (ref 0.0–0.1)
Basophils Relative: 1 % (ref 0–1)
EOS ABS: 0.1 10*3/uL (ref 0.0–0.7)
Eosinophils Relative: 2 % (ref 0–5)
HEMATOCRIT: 45.7 % (ref 39.0–52.0)
HEMOGLOBIN: 15.8 g/dL (ref 13.0–17.0)
LYMPHS PCT: 45 % (ref 12–46)
Lymphs Abs: 1.7 10*3/uL (ref 0.7–4.0)
MCH: 30.6 pg (ref 26.0–34.0)
MCHC: 34.6 g/dL (ref 30.0–36.0)
MCV: 88.6 fL (ref 78.0–100.0)
MPV: 11.7 fL (ref 8.6–12.4)
Monocytes Absolute: 0.3 10*3/uL (ref 0.1–1.0)
Monocytes Relative: 8 % (ref 3–12)
NEUTROS ABS: 1.6 10*3/uL — AB (ref 1.7–7.7)
NEUTROS PCT: 44 % (ref 43–77)
Platelets: 270 10*3/uL (ref 150–400)
RBC: 5.16 MIL/uL (ref 4.22–5.81)
RDW: 14.2 % (ref 11.5–15.5)
WBC: 3.7 10*3/uL — AB (ref 4.0–10.5)

## 2014-04-24 LAB — LIPID PANEL
Cholesterol: 191 mg/dL (ref 0–200)
HDL: 49 mg/dL (ref >39)
LDL Cholesterol: 127 mg/dL — ABNORMAL HIGH (ref 0–99)
TRIGLYCERIDES: 75 mg/dL (ref <150)
Total CHOL/HDL Ratio: 3.9 Ratio
VLDL: 15 mg/dL (ref 0–40)

## 2014-04-24 LAB — HEMOGLOBIN A1C
HEMOGLOBIN A1C: 6.5 % — AB (ref <5.7)
MEAN PLASMA GLUCOSE: 140 mg/dL — AB (ref <117)

## 2014-04-24 LAB — MAGNESIUM: Magnesium: 1.9 mg/dL (ref 1.5–2.5)

## 2014-04-24 NOTE — Progress Notes (Signed)
Assessment and Plan:  Hypertension: Continue medication, monitor blood pressure at home. Continue DASH diet.  Reminder to go to the ER if any CP, SOB, nausea, dizziness, severe HA, changes vision/speech, left arm numbness and tingling and jaw pain. Cholesterol: Continue diet and exercise. Check cholesterol.  Diabetes with diabetic chronic kidney disease-Continue diet and exercise. Check A1C Vitamin D Def- check level and continue medications.  Probable sleep apnea- will discuss with wife, weight loss advised.  Obesity with co morbidities- long discussion about weight loss, diet, and exercise   Continue diet and meds as discussed. Further disposition pending results of labs. Discussed med's effects and SE's.    HPI 69 y.o. male  presents for 3 month follow up with hypertension, hyperlipidemia, diabetes and vitamin D.  His blood pressure has been controlled at home, today their BP is BP: 138/78 mmHg. S/p MVR, he is on coumadin and follows with Dr. Clifton James.   He does workout, walks when the weather is nice. He denies chest pain, shortness of breath, dizziness.  He is on cholesterol medication, fenofibrate, intolerant to statins and denies myalgias. His cholesterol is not at goal. The cholesterol was:  12/20/2013: Cholesterol, Total 171; HDL Cholesterol by NMR 42; LDL (calc) 112*; Triglycerides 87  He has been working on diet and exercise for diabetes with diabetic chronic kidney disease last GFR 53, he is not on bASA due to coumadin use, he is on ACE/ARB, and denies  paresthesia of the feet, polydipsia, polyuria and visual disturbances. Last A1C was: 12/20/2013: Hemoglobin-A1c 7.1*  Patient is on Vitamin D supplement. 12/20/2013: Vit D, 25-Hydroxy 43  BMI is Body mass index is 29.56 kg/(m^2)., he is working on diet and exercise, he has lost 10lbs. Wt Readings from Last 3 Encounters:  04/24/14 224 lb (101.606 kg)  12/20/13 233 lb 3.2 oz (105.779 kg)  08/24/13 234 lb (106.142 kg)   He states that  his sister passed over the holidays due to stroke/2 heart attacks.   Current Medications:  Current Outpatient Prescriptions on File Prior to Visit  Medication Sig Dispense Refill  . acetaminophen (TYLENOL) 500 MG tablet Take 500 mg by mouth every 6 (six) hours as needed for pain.    . benazepril (LOTENSIN) 20 MG tablet TAKE 1 TABLET BY MOUTH ONCE DAILY 90 tablet 99  . Cholecalciferol (VITAMIN D PO) Take by mouth daily. TAKING 5,000 UNITS 4 TIMES A WEEK AN 2,000 UNITS 3 TIMES A WEEK    . clotrimazole-betamethasone (LOTRISONE) cream Apply 1 application topically 2 (two) times daily. PRN    . fenofibrate micronized (LOFIBRA) 134 MG capsule Take 1 capsule (134 mg total) by mouth every evening. 90 capsule 1  . fexofenadine (ALLEGRA) 30 MG tablet Take 30 mg by mouth as needed.     . hydrocortisone (ANUSOL-HC) 2.5 % rectal cream Apply to hemorrhoids 4 x daily as needed 30 g 5  . warfarin (COUMADIN) 5 MG tablet TAKE 2 TABLETS BY MOUTH DAILY EXCEPT 1 TABLET ON TUESDAYS, THURSDAYS AND SATURDAYS OR AS DIRECTED BY COUMADIN CLINIC 160 tablet 1   No current facility-administered medications on file prior to visit.   Medical History:  Past Medical History  Diagnosis Date  . Pulmonary embolism     march 2010, 2012              /   DVT x  2  LEFT 2010  . Coronary artery disease     non-obstructive by cath 2009  . Pre-diabetes   .  Peripheral vascular disease   . Severe mitral regurgitation by prior echocardiogram   . Sleep apnea     STOP BANG SCORE 4  . S/P mitral valve repair 08/15/2012    Complex valvuloplasty including triangular resection of posterior leaflet, artificial Gore-tex neocord placement x4 and Sorin Memo 3D ring annuloplasty via right mini thoracotomy approach  . Hyperlipidemia   . Hypertension     Clearance with note Dr Marvel Plan on chart,  Lovonox bridging  ordered by Dr Sanjuana Kava    EKG 9/12, chst 9/12 EPIC  . Heart murmur   . Seasonal allergies   . Osteoarthritis   . Vitamin D  deficiency   . Degenerative joint disease    Allergies:  Allergies  Allergen Reactions  . Statins     REACTION: muscle aches  . Welchol [Colesevelam Hcl]     Review of Systems:  Review of Systems  Constitutional: Negative.   HENT: Negative.        + snoring  Eyes: Negative.   Respiratory: Negative.   Cardiovascular: Negative.   Gastrointestinal: Negative.   Genitourinary: Negative.   Musculoskeletal: Negative.   Skin: Negative.   Neurological: Negative.   Endo/Heme/Allergies: Negative.   Psychiatric/Behavioral: Negative.     Family history- Review and unchanged Social history- Review and unchanged Physical Exam: BP 138/78 mmHg  Pulse 56  Temp(Src) 97.9 F (36.6 C)  Resp 16  Ht  (1.854 m)  Wt 224 lb (101.606 kg)  BMI 29.56 kg/m2 Wt Readings from Last 3 Encounters:  04/24/14 224 lb (101.606 kg)  12/20/13 233 lb 3.2 oz (105.779 kg)  08/24/13 234 lb (106.142 kg)   General Appearance: Well nourished, in no apparent distress. Eyes: PERRLA, EOMs, conjunctiva no swelling or erythema Sinuses: No Frontal/maxillary tenderness ENT/Mouth: Ext aud canals clear, TMs without erythema, bulging. No erythema, swelling, or exudate on post pharynx.  Tonsils not swollen or erythematous. Hearing normal. Crowded mouth Neck: Supple, thyroid normal.  Respiratory: Respiratory effort normal, BS equal bilaterally without rales, rhonchi, wheezing or stridor.  Cardio: RRR with no MRGs. Brisk peripheral pulses without edema.  Abdomen: Soft, + BS, obese  Non tender, no guarding, rebound, hernias, masses. Lymphatics: Non tender without lymphadenopathy.  Musculoskeletal: Full ROM, 5/5 strength, Antalgic gait Skin: Warm, dry without rashes, lesions, ecchymosis.  Neuro: Cranial nerves intact. No cerebellar symptoms.  Psych: Awake and oriented X 3, normal affect, Insight and Judgment appropriate.    Quentin Mulling, PA-C 9:02 AM [facility]

## 2014-04-24 NOTE — Patient Instructions (Signed)
Diabetes is a very complicated disease...lets simplify it.  An easy way to look at it to understand the complications is if you think of the extra sugar floating in your blood stream as glass shards floating through your blood stream.    Diabetes affects your small vessels first: 1) The glass shards (sugar) scraps down the tiny blood vessels in your eyes and lead to diabetic retinopathy, the leading cause of blindness in the [place]. Diabetes is the leading cause of newly diagnosed adult (20 to 68 years of age) blindness in the [place].  2) The glass shards scratches down the tiny vessels of your legs leading to nerve damage called neuropathy and can lead to amputations of your feet. More than 60% of all non-traumatic amputations of lower limbs occur in people with diabetes.  3) Over time the small vessels in your brain are shredded and closed off, individually this does not cause any problems but over a long period of time many of the small vessels being blocked can lead to Vascular Dementia.   4) Your kidney's are a filter system and have a "net" that keeps certain things in the body and lets bad things out. Sugar shreds this net and leads to kidney damage and eventually failure. Decreasing the sugar that is destroying the net and certain blood pressure medications can help stop or decrease progression of kidney disease. Diabetes was the primary cause of kidney failure in 44 percent of all new cases in 2011.  5) Diabetes also destroys the small vessels in your penis that lead to erectile dysfunction. Eventually the vessels are so damaged that you may not be responsive to cialis or viagra.   Diabetes and your large vessels: Your larger vessels consist of your coronary arteries in your heart and the carotid vessels to your brain. Diabetes or even increased sugars put you at 300% increased risk of heart attack and stroke and this is why.. The sugar scrapes down your large blood vessels and your body  sees this as an internal injury and tries to repair itself. Just like you get a scab on your skin, your platelets will stick to the blood vessel wall trying to heal it. This is why we have diabetics on low dose aspirin daily, this prevents the platelets from sticking and can prevent plaque formation. In addition, your body takes cholesterol and tries to shove it into the open wound. This is why we want your LDL, or bad cholesterol, below 70.   The combination of platelets and cholesterol over 5-10 years forms plaque that can break off and cause a heart attack or stroke.   PLEASE REMEMBER:  Diabetes is preventable! Up to 85 percent of complications and morbidities among individuals with type 2 diabetes can be prevented, delayed, or effectively treated and minimized with regular visits to a health professional, appropriate monitoring and medication, and a healthy diet and lifestyle.     Bad carbs also include fruit juice, alcohol, and sweet tea. These are empty calories that do not signal to your brain that you are full.   Please remember the good carbs are still carbs which convert into sugar. So please measure them out no more than 1/2-1 cup of rice, oatmeal, pasta, and beans  Veggies are however free foods! Pile them on.   Not all fruit is created equal. Please see the list below, the fruit at the bottom is higher in sugars than the fruit at the top. Please avoid all dried fruits.      I think it is possible that you have sleep apnea. It can cause interrupted sleep, headaches, frequent awakenings, fatigue, dry mouth, fast/slow heart beats, memory issues, anxiety/depression, swelling, numbness tingling hands/feet, weight gain, shortness of breath, and the list goes on. Sleep apnea needs to be ruled out because if it is left untreated it does eventually lead to abnormal heart beats, lung failure or heart failure as well as increasing the risk of heart attack and stroke. There are masks you can wear OR  a mouth piece that I can give you information about. Often times though people feel MUCH better after getting treatment.   Sleep Apnea  Sleep apnea is a sleep disorder characterized by abnormal pauses in breathing while you sleep. When your breathing pauses, the level of oxygen in your blood decreases. This causes you to move out of deep sleep and into light sleep. As a result, your quality of sleep is poor, and the system that carries your blood throughout your body (cardiovascular system) experiences stress. If sleep apnea remains untreated, the following conditions can develop:  High blood pressure (hypertension).  Coronary artery disease.  Inability to achieve or maintain an erection (impotence).  Impairment of your thought process (cognitive dysfunction). There are three types of sleep apnea: 1. Obstructive sleep apnea--Pauses in breathing during sleep because of a blocked airway. 2. Central sleep apnea--Pauses in breathing during sleep because the area of the brain that controls your breathing does not send the correct signals to the muscles that control breathing. 3. Mixed sleep apnea--A combination of both obstructive and central sleep apnea.  RISK FACTORS The following risk factors can increase your risk of developing sleep apnea:  Being overweight.  Smoking.  Having narrow passages in your nose and throat.  Being of older age.  Being male.  Alcohol use.  Sedative and tranquilizer use.  Ethnicity. Among individuals younger than 35 years, African Americans are at increased risk of sleep apnea. SYMPTOMS   Difficulty staying asleep.  Daytime sleepiness and fatigue.  Loss of energy.  Irritability.  Loud, heavy snoring.  Morning headaches.  Trouble concentrating.  Forgetfulness.  Decreased interest in sex. DIAGNOSIS  In order to diagnose sleep apnea, your caregiver will perform a physical examination. Your caregiver may suggest that you take a home sleep  test. Your caregiver may also recommend that you spend the night in a sleep lab. In the sleep lab, several monitors record information about your heart, lungs, and brain while you sleep. Your leg and arm movements and blood oxygen level are also recorded. TREATMENT The following actions may help to resolve mild sleep apnea:  Sleeping on your side.   Using a decongestant if you have nasal congestion.   Avoiding the use of depressants, including alcohol, sedatives, and narcotics.   Losing weight and modifying your diet if you are overweight. There also are devices and treatments to help open your airway:  Oral appliances. These are custom-made mouthpieces that shift your lower jaw forward and slightly open your bite. This opens your airway.  Devices that create positive airway pressure. This positive pressure "splints" your airway open to help you breathe better during sleep. The following devices create positive airway pressure:  Continuous positive airway pressure (CPAP) device. The CPAP device creates a continuous level of air pressure with an air pump. The air is delivered to your airway through a mask while you sleep. This continuous pressure keeps your airway open.  Nasal expiratory positive airway pressure (EPAP) device. The EPAP  device creates positive air pressure as you exhale. The device consists of single-use valves, which are inserted into each nostril and held in place by adhesive. The valves create very little resistance when you inhale but create much more resistance when you exhale. That increased resistance creates the positive airway pressure. This positive pressure while you exhale keeps your airway open, making it easier to breath when you inhale again.  Bilevel positive airway pressure (BPAP) device. The BPAP device is used mainly in patients with central sleep apnea. This device is similar to the CPAP device because it also uses an air pump to deliver continuous air  pressure through a mask. However, with the BPAP machine, the pressure is set at two different levels. The pressure when you exhale is lower than the pressure when you inhale.  Surgery. Typically, surgery is only done if you cannot comply with less invasive treatments or if the less invasive treatments do not improve your condition. Surgery involves removing excess tissue in your airway to create a wider passage way. Document Released: 02/26/2002 Document Revised: 07/03/2012 Document Reviewed: 07/15/2011 [facility] Patient Information 2015 [place], [place]. This information is not intended to replace advice given to you by your health care provider. Make sure you discuss any questions you have with your health care provider.     Marland Kitchen

## 2014-04-25 LAB — VITAMIN D 25 HYDROXY (VIT D DEFICIENCY, FRACTURES): Vit D, 25-Hydroxy: 44 ng/mL (ref 30–100)

## 2014-04-25 LAB — TSH: TSH: 1.105 u[IU]/mL (ref 0.350–4.500)

## 2014-05-13 ENCOUNTER — Encounter: Payer: Self-pay | Admitting: Internal Medicine

## 2014-05-17 ENCOUNTER — Encounter: Payer: Self-pay | Admitting: Internal Medicine

## 2014-05-23 ENCOUNTER — Encounter: Payer: Self-pay | Admitting: Gastroenterology

## 2014-05-28 ENCOUNTER — Encounter: Payer: Self-pay | Admitting: Internal Medicine

## 2014-05-30 ENCOUNTER — Ambulatory Visit (INDEPENDENT_AMBULATORY_CARE_PROVIDER_SITE_OTHER): Payer: 59 | Admitting: *Deleted

## 2014-05-30 DIAGNOSIS — I340 Nonrheumatic mitral (valve) insufficiency: Secondary | ICD-10-CM

## 2014-05-30 DIAGNOSIS — Z7901 Long term (current) use of anticoagulants: Secondary | ICD-10-CM

## 2014-05-30 DIAGNOSIS — Z5181 Encounter for therapeutic drug level monitoring: Secondary | ICD-10-CM

## 2014-05-30 DIAGNOSIS — I2699 Other pulmonary embolism without acute cor pulmonale: Secondary | ICD-10-CM

## 2014-05-30 DIAGNOSIS — Z9889 Other specified postprocedural states: Secondary | ICD-10-CM | POA: Diagnosis not present

## 2014-05-30 LAB — POCT INR: INR: 2.5

## 2014-05-31 ENCOUNTER — Telehealth: Payer: Self-pay

## 2014-05-31 ENCOUNTER — Ambulatory Visit (INDEPENDENT_AMBULATORY_CARE_PROVIDER_SITE_OTHER): Payer: 59 | Admitting: Gastroenterology

## 2014-05-31 ENCOUNTER — Encounter: Payer: Self-pay | Admitting: Gastroenterology

## 2014-05-31 VITALS — BP 128/74 | HR 70 | Ht 72.0 in | Wt 224.0 lb

## 2014-05-31 DIAGNOSIS — Z7901 Long term (current) use of anticoagulants: Secondary | ICD-10-CM

## 2014-05-31 DIAGNOSIS — Z86010 Personal history of colonic polyps: Secondary | ICD-10-CM | POA: Diagnosis not present

## 2014-05-31 MED ORDER — MOVIPREP 100 G PO SOLR: 1.0000 | Freq: Once | ORAL | Status: DC

## 2014-05-31 NOTE — Progress Notes (Signed)
05/31/2014 [name] [id number] 12-07-1945   HISTORY OF PRESENT ILLNESS:  This is a pleasant 69 year old male who is previously known to Dr. Henrene Pastor for colonoscopy in 04/2009 at which time he was found to have one polyp removed from the sigmoid colon that was a hyperplastic polyp and diverticulosis.  He has a history of tubular adenoma on colonoscopy in 11/2005 so repeat colonoscopy was recommended at 5 year interval from the last.  He denies any GI complaints.  Is here to schedule recall colonoscopy.  Had some hemorrhoid irritation recently (no bleeding), but is using cream from PCP and it has helped almost 100%.  He is on chronic coumadin for recurrent DVT's and PE's in 2010 and 2012.  He tells me that the coumadin is lifelong, but it has been held for other procedures, however, he believes that they used lovenox as a bridge.  Had a MVR in 2014 and has done well with that.   Past Medical History  Diagnosis Date  . Pulmonary embolism     march 2010, 2012              /   DVT x  2  LEFT 2010  . Coronary artery disease     non-obstructive by cath 2009  . Pre-diabetes   . Peripheral vascular disease   . Severe mitral regurgitation by prior echocardiogram   . Sleep apnea     STOP BANG SCORE 4  . S/P mitral valve repair 08/15/2012    Complex valvuloplasty including triangular resection of posterior leaflet, artificial Gore-tex neocord placement x4 and Sorin Memo 3D ring annuloplasty via right mini thoracotomy approach  . Hyperlipidemia   . Hypertension     Clearance with note Dr Ernestine Conrad on chart,  Lovonox bridging  ordered by Dr Julianne Handler    EKG 9/12, chst 9/12 EPIC  . Heart murmur   . Seasonal allergies   . Osteoarthritis   . Vitamin D deficiency   . Degenerative joint disease    Past Surgical History  Procedure Laterality Date  . Achillis tendon      repair 20 years ago  . Hip arthroplasty      11/06/07  . Total hip arthroplasty  08/24/2011    Procedure: TOTAL HIP ARTHROPLASTY  ANTERIOR APPROACH;  Surgeon: Mauri Pole, MD;  Location: [facility];  Service: Orthopedics;  Laterality: Right;  . Tee without cardioversion N/A 07/04/2012    Procedure: TRANSESOPHAGEAL ECHOCARDIOGRAM (TEE);  Surgeon: Larey Dresser, MD;  Location: [place];  Service: Cardiovascular;  Laterality: N/A;  . Cardiac catheterization    . Mitral valve repair Right 08/15/2012    Procedure: MINIMALLY INVASIVE MITRAL VALVE REPAIR (MVR);  Surgeon: Rexene Alberts, MD;  Location: [place];  Service: Open Heart Surgery;  Laterality: Right;  . Intraoperative transesophageal echocardiogram N/A 08/15/2012    Procedure: INTRAOPERATIVE TRANSESOPHAGEAL ECHOCARDIOGRAM;  Surgeon: Rexene Alberts, MD;  Location: [place];  Service: Open Heart Surgery;  Laterality: N/A;    reports that he quit smoking about 41 years ago. His smoking use included Cigars. He quit smokeless tobacco use about 21 months ago. His smokeless tobacco use included Chew. He reports that he does not drink alcohol or use illicit drugs. family history includes Coronary artery disease in an other family member; Diabetes in his brother and sister; Heart attack in his father; Heart attack (age of onset: 51) in his brother; Hyperlipidemia in his sister; Hypertension in his brother, father,  and sister. Allergies  Allergen Reactions  . Statins     REACTION: muscle aches  . Welchol [Colesevelam Hcl]       Outpatient Encounter Prescriptions as of 05/31/2014  Medication Sig  . acetaminophen (TYLENOL) 500 MG tablet Take 500 mg by mouth every 6 (six) hours as needed for pain.  . Cholecalciferol (VITAMIN D PO) Take by mouth daily. TAKING 5,000 UNITS 4 TIMES A WEEK AN 2,000 UNITS 3 TIMES A WEEK  . clotrimazole-betamethasone (LOTRISONE) cream Apply 1 application topically 2 (two) times daily. PRN  . fenofibrate micronized (LOFIBRA) 134 MG capsule Take 1 capsule (134 mg total) by mouth every evening.  . fexofenadine (ALLEGRA) 30 MG tablet Take 30 mg by mouth as  needed.   . warfarin (COUMADIN) 5 MG tablet TAKE 2 TABLETS BY MOUTH DAILY EXCEPT 1 TABLET ON TUESDAYS, THURSDAYS AND SATURDAYS OR AS DIRECTED BY COUMADIN CLINIC  . [DISCONTINUED] benazepril (LOTENSIN) 20 MG tablet TAKE 1 TABLET BY MOUTH ONCE DAILY  . hydrocortisone (ANUSOL-HC) 2.5 % rectal cream Apply to hemorrhoids 4 x daily as needed  . MOVIPREP 100 G SOLR Take 1 kit (200 g total) by mouth once.     REVIEW OF SYSTEMS  : All other systems reviewed and negative except where noted in the History of Present Illness.   PHYSICAL EXAM: BP 128/74 mmHg  Pulse 70  Ht 6' (1.829 m)  Wt 224 lb (101.606 kg)  BMI 30.37 kg/m2 General: Well developed black male in no acute distress Head: Normocephalic and atraumatic Eyes:  Sclerae anicteric, conjunctiva pink. Ears: Normal auditory acuity Lungs: Clear throughout to auscultation Heart: Regular rate and rhythm Abdomen: Soft, non-distended.  Normal bowel sounds.  Non-tender. Rectal:  Will be done at the time of colonoscopy. Musculoskeletal: Symmetrical with no gross deformities  Skin: No lesions on visible extremities Extremities: No edema  Neurological: Alert oriented x 4, grossly non-focal Psychological:  Alert and cooperative. Normal mood and affect  ASSESSMENT AND PLAN: -Personal history of colon polyps:  Hyperplastic polyp in 2011, but history of tubular adenoma in 2007.  Will schedule colonoscopy with Dr. Henrene Pastor.   -Chronic anticoagulation:  On chronic coumadin due to recurrent PE's.  Will likely need bridged to Lovenox, which can be addressed by prescribing physician.  Will hold coumadin for 5 days prior to endoscopic procedures - will instruct when and how to resume after procedure. Benefits and risks of procedure explained including risks of bleeding, perforation, infection, missed lesions, reactions to medications and possible need for hospitalization and surgery for complications. Additional rare but real risk of stroke or other vascular  clotting events off coumadin also explained and need to seek urgent help if any signs of these problems occur. Will communicate by phone or EMR with patient's prescribing provider to confirm that holding coumadin is reasonable in this case.    CC:  Unk Pinto, MD

## 2014-05-31 NOTE — Telephone Encounter (Signed)
[name], He is followed in our coumadin clinic and is on lifelong coumadin therapy. His primary care is Dr. Oneta Rack. I am being asked if he can hold coumadin for his endoscopic procedure. I think he needs bridging with Lovenox but I am noto the doctor taking care of his DVT/PE. We may need to have Dr. Oneta Rack comment on this and give recommendations. If Dr. Oneta Rack agrees with Lovenox, can you assist with the bridging protocol?   Thanks,  Earney Hamburg

## 2014-05-31 NOTE — Patient Instructions (Signed)

## 2014-05-31 NOTE — Telephone Encounter (Signed)
  05/31/2014   RE: GRAYLING YEPES DOB: 09/18/1945 MRN: [id number]   Dear Sanjuana Kava,    We have scheduled the above patient for an endoscopic procedure. Our records show that he is on anticoagulation therapy.   Please advise as to how long the patient may come off his therapy of Coumadin prior to the procedure, which is scheduled for 34/22/2016.  Please fax back/ or route the completed form to [facility] at [phone].   Sincerely,    Gracy Racer

## 2014-06-02 NOTE — Progress Notes (Signed)
Agree with initial assessment and plans.Plase confirm with his cardiologist or PCP the management of anticoagulation.

## 2014-06-04 NOTE — Telephone Encounter (Signed)
Thayer Ohm - I agree a lovenox bridge is appropriate and defer to the coumadin clinic to manage as the are handling his coumadin - thanks for the update - bill mck

## 2014-06-04 NOTE — Telephone Encounter (Signed)
Will await for Dr. Kathryne Sharper response.  Given history of multiple PE's/DVT's- he would meet criteria for Lovenox bridging per our protocol.

## 2014-06-04 NOTE — Telephone Encounter (Signed)
Kennon Rounds I defer to Dr Sanjuana Kava and agree and also defer to the coumadin clinic to bridge as they (you) are managing his coumadin  - thanks - bill mck

## 2014-06-05 NOTE — Telephone Encounter (Signed)
Thanks Optometrist. Kennon Rounds can we make arrangements to bridge with Lovenox? Thanks,chris

## 2014-06-11 NOTE — Telephone Encounter (Signed)
Attempted to reach pt twice to reschedule appt.  Appt made to see pt on 4/6 when he sees Dr. Clifton James.

## 2014-06-26 ENCOUNTER — Ambulatory Visit (INDEPENDENT_AMBULATORY_CARE_PROVIDER_SITE_OTHER): Payer: 59 | Admitting: *Deleted

## 2014-06-26 ENCOUNTER — Encounter: Payer: Self-pay | Admitting: Cardiovascular Disease

## 2014-06-26 ENCOUNTER — Ambulatory Visit (INDEPENDENT_AMBULATORY_CARE_PROVIDER_SITE_OTHER): Payer: 59 | Admitting: Cardiovascular Disease

## 2014-06-26 VITALS — BP 136/92 | HR 72 | Ht 72.0 in | Wt 222.0 lb

## 2014-06-26 DIAGNOSIS — R05 Cough: Secondary | ICD-10-CM | POA: Diagnosis not present

## 2014-06-26 DIAGNOSIS — Z7901 Long term (current) use of anticoagulants: Secondary | ICD-10-CM

## 2014-06-26 DIAGNOSIS — R059 Cough, unspecified: Secondary | ICD-10-CM

## 2014-06-26 DIAGNOSIS — Z9889 Other specified postprocedural states: Secondary | ICD-10-CM

## 2014-06-26 DIAGNOSIS — I340 Nonrheumatic mitral (valve) insufficiency: Secondary | ICD-10-CM

## 2014-06-26 DIAGNOSIS — I2699 Other pulmonary embolism without acute cor pulmonale: Secondary | ICD-10-CM | POA: Diagnosis not present

## 2014-06-26 DIAGNOSIS — Z5181 Encounter for therapeutic drug level monitoring: Secondary | ICD-10-CM

## 2014-06-26 DIAGNOSIS — I2510 Atherosclerotic heart disease of native coronary artery without angina pectoris: Secondary | ICD-10-CM | POA: Diagnosis not present

## 2014-06-26 LAB — POCT INR: INR: 2.9

## 2014-06-26 MED ORDER — ENOXAPARIN SODIUM 150 MG/ML ~~LOC~~ SOLN: 150.0000 mg | SUBCUTANEOUS | Status: DC

## 2014-06-26 NOTE — Patient Instructions (Signed)

## 2014-06-26 NOTE — Patient Instructions (Addendum)
07/06/14- Take last dose of Coumadin  07/07/14- Do no Coumadin or Lovenox   07/08/14- Start taking Lovenox 150mg s once day at 8am subcutaneously into fatty abdominal tissue, 2 inches away from navel. Rotate site of injections.   07/09/14- Continue Lovenox  150mg  injection subcutaneously into fatty abdominal tissue, 2 inches away from navel at 8am.   07/10/14- Continue Lovenox  150mg  injection subcutaneously into fatty abdominal tissue, 2 inches away from navel at 8am.  07/11/14- Take last  Lovenox  150mg  injection subcutaneously into fatty abdominal tissue, 2 inches away from navel at 8am. This is last dose Of Lovenox until after                  procedure.   07/12/14- Day of Procedure, report to facility. Restart Coumadin and Lovenox after procedure when okayed by Dr Marina Goodell. Possible you will restart Coumadin the night after procedure, when you do restart Coumadin take an extra 1/2 pill along with normal dosage for 2 days, then resume normal doses. Restart Lovenox 150mg  injection once daily when okayed with Dr Marina Goodell, usually day after or longer depending of what was done during procedure. After procedure both Coumadin and Lovenox will be taking together until follow up appt on 07/17/14.

## 2014-06-26 NOTE — Progress Notes (Signed)
Chief Complaint  Patient presents with  . Cough    History of Present Illness:  69 yo male with history of HTN, recurrent pulmonary emboli on anticoagulation, nonobstructive CAD and prior MV prolapse with MR now s/p MV repair May 2014 per Dr. Roxy Manns who is here today for cardiac follow up. Post op course was uneventful but he did have some PVCs and short runs of VT and was started on beta blocker therapy. Cardiac cath April 2014 with mild CAD. He had a hip replacement 2009 and presented with SOB March 2010 and was found to have bilateral pulmonary emboli. Repeat studies without PE or DVT so coumadin therapy was stopped. Readmitted to [facility] October 31, 2010 with SOB and found to have recurrent Pulmonary Embolism by CTA. He has been on coumadin chronically since then. I saw him 11/27/12 after he had several episodes of dizziness. The first was while driving. He did not pass out. Several subsequent episodes of mild dizziness. No associated palpitations or awareness of irregularity of his heart rhythm. No chest pain or SOB. He has been in cardiac rehab and doing well with exercise. I arranged a 21 day event monitor which showed sinus rhythm with PVCs but no other arrythmias.   He is here today for follow up. He has been doing well. No chest pain or SOB. He has been having sinus issues with the pollen and has post nasal drip. No fever, chills. No further dizziness, near syncope or syncope.   Primary Care Physician: Unk Pinto  Past Medical History  Diagnosis Date  . Pulmonary embolism     march 2010, 2012              /   DVT x  2  LEFT 2010  . Coronary artery disease     non-obstructive by cath 2009  . Pre-diabetes   . Peripheral vascular disease   . Severe mitral regurgitation by prior echocardiogram   . Sleep apnea     STOP BANG SCORE 4  . S/P mitral valve repair 08/15/2012    Complex valvuloplasty including triangular resection of posterior leaflet, artificial Gore-tex neocord  placement x4 and Sorin Memo 3D ring annuloplasty via right mini thoracotomy approach  . Hyperlipidemia   . Hypertension     Clearance with note Dr Ernestine Conrad on chart,  Lovonox bridging  ordered by Dr Julianne Handler    EKG 9/12, chst 9/12 EPIC  . Heart murmur   . Seasonal allergies   . Osteoarthritis   . Vitamin D deficiency   . Degenerative joint disease     Past Surgical History  Procedure Laterality Date  . Achillis tendon      repair 20 years ago  . Hip arthroplasty      11/06/07  . Total hip arthroplasty  08/24/2011    Procedure: TOTAL HIP ARTHROPLASTY ANTERIOR APPROACH;  Surgeon: Mauri Pole, MD;  Location: [facility];  Service: Orthopedics;  Laterality: Right;  . Tee without cardioversion N/A 07/04/2012    Procedure: TRANSESOPHAGEAL ECHOCARDIOGRAM (TEE);  Surgeon: Larey Dresser, MD;  Location: [place];  Service: Cardiovascular;  Laterality: N/A;  . Cardiac catheterization    . Mitral valve repair Right 08/15/2012    Procedure: MINIMALLY INVASIVE MITRAL VALVE REPAIR (MVR);  Surgeon: Rexene Alberts, MD;  Location: [place];  Service: Open Heart Surgery;  Laterality: Right;  . Intraoperative transesophageal echocardiogram N/A 08/15/2012    Procedure: INTRAOPERATIVE TRANSESOPHAGEAL ECHOCARDIOGRAM;  Surgeon: Rexene Alberts, MD;  [facility];  Service: Open Heart Surgery;  Laterality: N/A;    Current Outpatient Prescriptions  Medication Sig Dispense Refill  . acetaminophen (TYLENOL) 500 MG tablet Take 500 mg by mouth every 6 (six) hours as needed for pain.    . Cholecalciferol (VITAMIN D PO) Take by mouth daily. TAKING 5,000 UNITS 4 TIMES A WEEK AN 2,000 UNITS 3 TIMES A WEEK    . clotrimazole-betamethasone (LOTRISONE) cream Apply 1 application topically 2 (two) times daily. PRN    . fenofibrate micronized (LOFIBRA) 134 MG capsule Take 1 capsule (134 mg total) by mouth every evening. 90 capsule 1  . fexofenadine (ALLEGRA) 30 MG tablet Take 30 mg by mouth as needed.     . hydrocortisone  (ANUSOL-HC) 2.5 % rectal cream Apply to hemorrhoids 4 x daily as needed 30 g 5  . mometasone (NASONEX) 50 MCG/ACT nasal spray Place 2 sprays into the nose daily.    Marland Kitchen MOVIPREP 100 G SOLR Take 1 kit (200 g total) by mouth once. 1 kit 0  . warfarin (COUMADIN) 5 MG tablet TAKE 2 TABLETS BY MOUTH DAILY EXCEPT 1 TABLET ON TUESDAYS, THURSDAYS AND SATURDAYS OR AS DIRECTED BY COUMADIN CLINIC 160 tablet 1   No current facility-administered medications for this visit.    Allergies  Allergen Reactions  . Statins     REACTION: muscle aches  . Welchol [Colesevelam Hcl]     History   Social History  . Marital Status: Married    Spouse Name: N/A  . Number of Children: 3  . Years of Education: N/A   Occupational History  . Industrial hygeinist    Social History Main Topics  . Smoking status: Former Smoker    Types: Cigars    Quit date: 03/22/1973  . Smokeless tobacco: Former Systems developer    Types: Chew    Quit date: 08/15/2012  . Alcohol Use: No  . Drug Use: No  . Sexual Activity: Not Currently   Other Topics Concern  . Not on file   Social History Narrative    Family History  Problem Relation Age of Onset  . Coronary artery disease    . Heart attack Father   . Heart attack Brother 49  . Hypertension Father   . Hypertension Sister   . Hypertension Brother   . Hyperlipidemia Sister   . Diabetes Sister   . Diabetes Brother     Review of Systems:  As stated in the HPI and otherwise negative.   BP 136/92 mmHg  Pulse 72  Ht 6' (1.829 m)  Wt 222 lb (100.699 kg)  BMI 30.10 kg/m2  Physical Examination: General: Well developed, well nourished, NAD HEENT: OP clear, mucus membranes moist SKIN: warm, dry. No rashes. Neuro: No focal deficits Musculoskeletal: Muscle strength 5/5 all ext Psychiatric: Mood and affect normal Neck: No JVD, no carotid bruits, no thyromegaly, no lymphadenopathy. Lungs:Clear bilaterally, no wheezes, rhonci, crackles Cardiovascular: Regular rate and  rhythm. No murmurs, gallops or rubs. Abdomen:Soft. Bowel sounds present. Non-tender.  Extremities: No lower extremity edema. Pulses are 2 + in the bilateral DP/PT.  EKG:  EKG is ordered today. The ekg ordered today demonstrates NSR, rate 60 bpm. PVC. LVH.   Recent Labs: 04/24/2014: ALT 18; BUN 19; Creatinine 1.39*; Hemoglobin 15.8; Magnesium 1.9; Platelets 270; Potassium 4.3; Sodium 138; TSH 1.105   Lipid Panel    Component Value Date/Time   CHOL 191 04/24/2014 0924   TRIG 75 04/24/2014 0924   HDL 49 04/24/2014 0924  CHOLHDL 3.9 04/24/2014 0924   VLDL 15 04/24/2014 0924   LDLCALC 127* 04/24/2014 0924     Wt Readings from Last 3 Encounters:  06/26/14 222 lb (100.699 kg)  05/31/14 224 lb (101.606 kg)  04/24/14 224 lb (101.606 kg)     Other studies Reviewed: Additional studies/ records that were reviewed today include:. Review of the above records demonstrates:    Assessment and Plan:   1. MITRAL REGURGITATION: s/p Mitral valve repair. Stable. Will plan echo now.   2. Recurrent DVT/PE: He will need coumadin for lifetime. Followed in coumadin clinic .  3. CAD, NATIVE VESSEL: Stable. No changes. No ASA since he is on long term coumadin.   4. Cough: Associated with sinus issues. If persists beyond pollen season, consider trial off of Ace-inh and start ARB for HTN  Current medicines are reviewed at length with the patient today.  The patient does not have concerns regarding medicines.  The following changes have been made:  no change  Labs/ tests ordered today include:  Orders Placed This Encounter  Procedures  . 2D Echocardiogram without contrast    Disposition:   FU with me in 12  months  Signed, Lauree Chandler, MD 06/26/2014 9:33 AM    [facility] [place], [place], [place]  [address] Phone: [phone]; Fax: [phone]

## 2014-07-08 ENCOUNTER — Telehealth: Payer: Self-pay | Admitting: Internal Medicine

## 2014-07-08 ENCOUNTER — Telehealth: Payer: Self-pay | Admitting: Cardiovascular Disease

## 2014-07-08 MED ORDER — MOVIPREP 100 G PO SOLR: 1.0000 | Freq: Once | ORAL | Status: DC

## 2014-07-08 NOTE — Telephone Encounter (Signed)
New message         Will pt need a Lovenox bridge?

## 2014-07-08 NOTE — Telephone Encounter (Signed)
Spoke with patient and he states he is following the instructions for the lovenox bridge.  He states he started lovenox as insrtucted. He asked where to pick up up his prep for colonoscopy, advised him to call the GI office.  Spoke with Verlon Au at GI and she is aware that the patient has instructions on lovenox and has been following them. Also, advised he that the patient need information on prep medication. She will follow up with the patient.

## 2014-07-08 NOTE — Telephone Encounter (Signed)
Coumadin clinic called and clarified that patient was currently doing his lovenox bridge.  Sent moviprep rx to pharmacy.

## 2014-07-08 NOTE — Telephone Encounter (Signed)
Spoke with cardiology to see if patient had started the Lovenox bridge that had been discussed for him.  I emphasized that his procedure is this Friday and I needed to know if he should keep the appointment.  Message left for Waynard Reeds to call me back tomorrow asap.

## 2014-07-12 ENCOUNTER — Encounter: Payer: Self-pay | Admitting: Internal Medicine

## 2014-07-12 ENCOUNTER — Ambulatory Visit (AMBULATORY_SURGERY_CENTER): Payer: 59 | Admitting: Internal Medicine

## 2014-07-12 VITALS — BP 127/89 | HR 64 | Temp 96.0°F | Resp 14 | Ht 72.0 in | Wt 224.0 lb

## 2014-07-12 DIAGNOSIS — Z86010 Personal history of colonic polyps: Secondary | ICD-10-CM | POA: Diagnosis present

## 2014-07-12 MED ORDER — SODIUM CHLORIDE 0.9 % IV SOLN: 500.0000 mL | INTRAVENOUS | Status: DC

## 2014-07-12 NOTE — Progress Notes (Signed)
A/ox3 pleased with MAC, report to Penny RN 

## 2014-07-12 NOTE — Op Note (Addendum)
[facility] [address] [place], [address]   COLONOSCOPY PROCEDURE REPORT  PATIENT: Marcus, Griffin  MR#: [id number] BIRTHDATE: [birth year] , 68  yrs. old GENDER: male ENDOSCOPIST: Roxy Cedar, MD REFERRED PJ:[id number] Program Recall PROCEDURE DATE:  07/12/2014 PROCEDURE:   Colonoscopy, surveillance First Screening Colonoscopy - Avg.  risk and is 50 yrs.  old or older - No.  Prior Negative Screening - Now for repeat screening. N/A  History of Adenoma - Now for follow-up colonoscopy & has been > or = to 3 yrs.  Yes hx of adenoma.  Has been 3 or more years since last colonoscopy. ASA CLASS:   Class III INDICATIONS:Surveillance due to prior colonic neoplasia and PH Colon Adenoma.  Prior exams 2007 ([facility], [facility]); 2011 ([facility]) MEDICATIONS: Monitored anesthesia care and Propofol 160 mg IV  DESCRIPTION OF PROCEDURE:   After the risks benefits and alternatives of the procedure were thoroughly explained, informed consent was obtained.  The digital rectal exam revealed no abnormalities of the rectum.   The [id number]  endoscope was introduced through the anus and advanced to the cecum, which was identified by both the appendix and ileocecal valve. No adverse events experienced.   The quality of the prep was excellent. (MoviPrep was used)  The instrument was then slowly withdrawn as the colon was fully examined.      COLON FINDINGS: There was moderate diverticulosis noted throughout the entire examined colon.   The examination was otherwise normal. Retroflexed views revealed internal hemorrhoids. The time to cecum = 2.0 Withdrawal time = 9.8   The scope was withdrawn and the procedure completed. COMPLICATIONS: There were no immediate complications.  ENDOSCOPIC IMPRESSION: 1.   Moderate diverticulosis was noted throughout the entire examined colon 2.   The examination was otherwise normal  RECOMMENDATIONS: 1. Continue current colorectal surveillance  recommendations with a repeat colonoscopy in 10 years.  eSigned:  Roxy Cedar, MD 07/12/2014 1:56 PM   cc: The Patient and Lucky Cowboy, MD

## 2014-07-12 NOTE — Patient Instructions (Signed)
YOU HAD AN ENDOSCOPIC PROCEDURE TODAY AT THE [facility]:   Refer to the procedure report that was given to you for any specific questions about what was found during the examination.  If the procedure report does not answer your questions, please call your gastroenterologist to clarify.  If you requested that your care partner not be given the details of your procedure findings, then the procedure report has been included in a sealed envelope for you to review at your convenience later.  YOU SHOULD EXPECT: Some feelings of bloating in the abdomen. Passage of more gas than usual.  Walking can help get rid of the air that was put into your GI tract during the procedure and reduce the bloating. If you had a lower endoscopy (such as a colonoscopy or flexible sigmoidoscopy) you may notice spotting of blood in your stool or on the toilet paper. If you underwent a bowel prep for your procedure, you may not have a normal bowel movement for a few days.  Please Note:  You might notice some irritation and congestion in your nose or some drainage.  This is from the oxygen used during your procedure.  There is no need for concern and it should clear up in a day or so.  SYMPTOMS TO REPORT IMMEDIATELY:   Following lower endoscopy (colonoscopy or flexible sigmoidoscopy):  Excessive amounts of blood in the stool  Significant tenderness or worsening of abdominal pains  Swelling of the abdomen that is new, acute  Fever of 100F or higher    For urgent or emergent issues, a gastroenterologist can be reached at any hour by calling [phone].   DIET: Your first meal following the procedure should be a small meal and then it is ok to progress to your normal diet. Heavy or fried foods are harder to digest and may make you feel nauseous or bloated.  Likewise, meals heavy in dairy and vegetables can increase bloating.  Drink plenty of fluids but you should avoid alcoholic beverages for 24  hours.  ACTIVITY:  You should plan to take it easy for the rest of today and you should NOT DRIVE or use heavy machinery until tomorrow (because of the sedation medicines used during the test).    FOLLOW UP: Our staff will call the number listed on your records the next business day following your procedure to check on you and address any questions or concerns that you may have regarding the information given to you following your procedure. If we do not reach you, we will leave a message.  However, if you are feeling well and you are not experiencing any problems, there is no need to return our call.  We will assume that you have returned to your regular daily activities without incident.  If any biopsies were taken you will be contacted by phone or by letter within the next 1-3 weeks.  Please call us at [phone] if you have not heard about the biopsies in 3 weeks.    SIGNATURES/CONFIDENTIALITY: You and/or your care partner have signed paperwork which will be entered into your electronic medical record.  These signatures attest to the fact that that the information above on your After Visit Summary has been reviewed and is understood.  Full responsibility of the confidentiality of this discharge information lies with you and/or your care-partner.   INFORMATION ON DIVERTICULOSIS & HIGH FIBER DIET GIVEN TO YOU TODAY  RESUME COUMADIN/LOVENOX TODAY & FOLLOW UP WITH COUMADIN CLINIC ON  Monday FOR FURTHER MANAGEMENT

## 2014-07-15 ENCOUNTER — Telehealth: Payer: Self-pay | Admitting: Cardiovascular Disease

## 2014-07-15 ENCOUNTER — Telehealth: Payer: Self-pay

## 2014-07-15 NOTE — Telephone Encounter (Signed)
New Message  Pt called request call back. States that he was advised per tiffany to call back after the colonoscopy to decide how move forward with his medication. Please call

## 2014-07-15 NOTE — Telephone Encounter (Signed)
Pt came to the clinic today for instructions.  He had taken 3 tablets on Friday, 1 1/2 tablets on Saturday and 3 tablets on Sunday.  He thinks he restarted his Lovenox on Friday but unsure.  Have instructed him to continue normal dose of Coumadin and Lovenox until his appt that is scheduled for Wednesday.

## 2014-07-15 NOTE — Telephone Encounter (Signed)
  Follow up Call-  Call back number 07/12/2014  Post procedure Call Back phone  # [phone]  Permission to leave phone message Yes     Patient questions:  Do you have a fever, pain , or abdominal swelling? No. Pain Score  0 *  Have you tolerated food without any problems? Yes.    Have you been able to return to your normal activities? Yes.    Do you have any questions about your discharge instructions: Diet   No. Medications  No. Follow up visit  No.  Do you have questions or concerns about your Care? No.  Actions: * If pain score is 4 or above: No action needed, pain <4.

## 2014-07-17 ENCOUNTER — Ambulatory Visit (INDEPENDENT_AMBULATORY_CARE_PROVIDER_SITE_OTHER): Payer: 59 | Admitting: *Deleted

## 2014-07-17 DIAGNOSIS — Z9889 Other specified postprocedural states: Secondary | ICD-10-CM

## 2014-07-17 DIAGNOSIS — Z5181 Encounter for therapeutic drug level monitoring: Secondary | ICD-10-CM

## 2014-07-17 DIAGNOSIS — I2699 Other pulmonary embolism without acute cor pulmonale: Secondary | ICD-10-CM | POA: Diagnosis not present

## 2014-07-17 DIAGNOSIS — I340 Nonrheumatic mitral (valve) insufficiency: Secondary | ICD-10-CM

## 2014-07-17 DIAGNOSIS — Z7901 Long term (current) use of anticoagulants: Secondary | ICD-10-CM | POA: Diagnosis not present

## 2014-07-17 LAB — POCT INR: INR: 2.1

## 2014-07-23 IMAGING — CR DG CHEST 1V PORT
1 series · 1 of 1 positions shown · non-contrast
Comparison: 08/15/2012

CLINICAL DATA: Status post chest tube placement

PORTABLE CHEST - 1 VIEW

[AP]
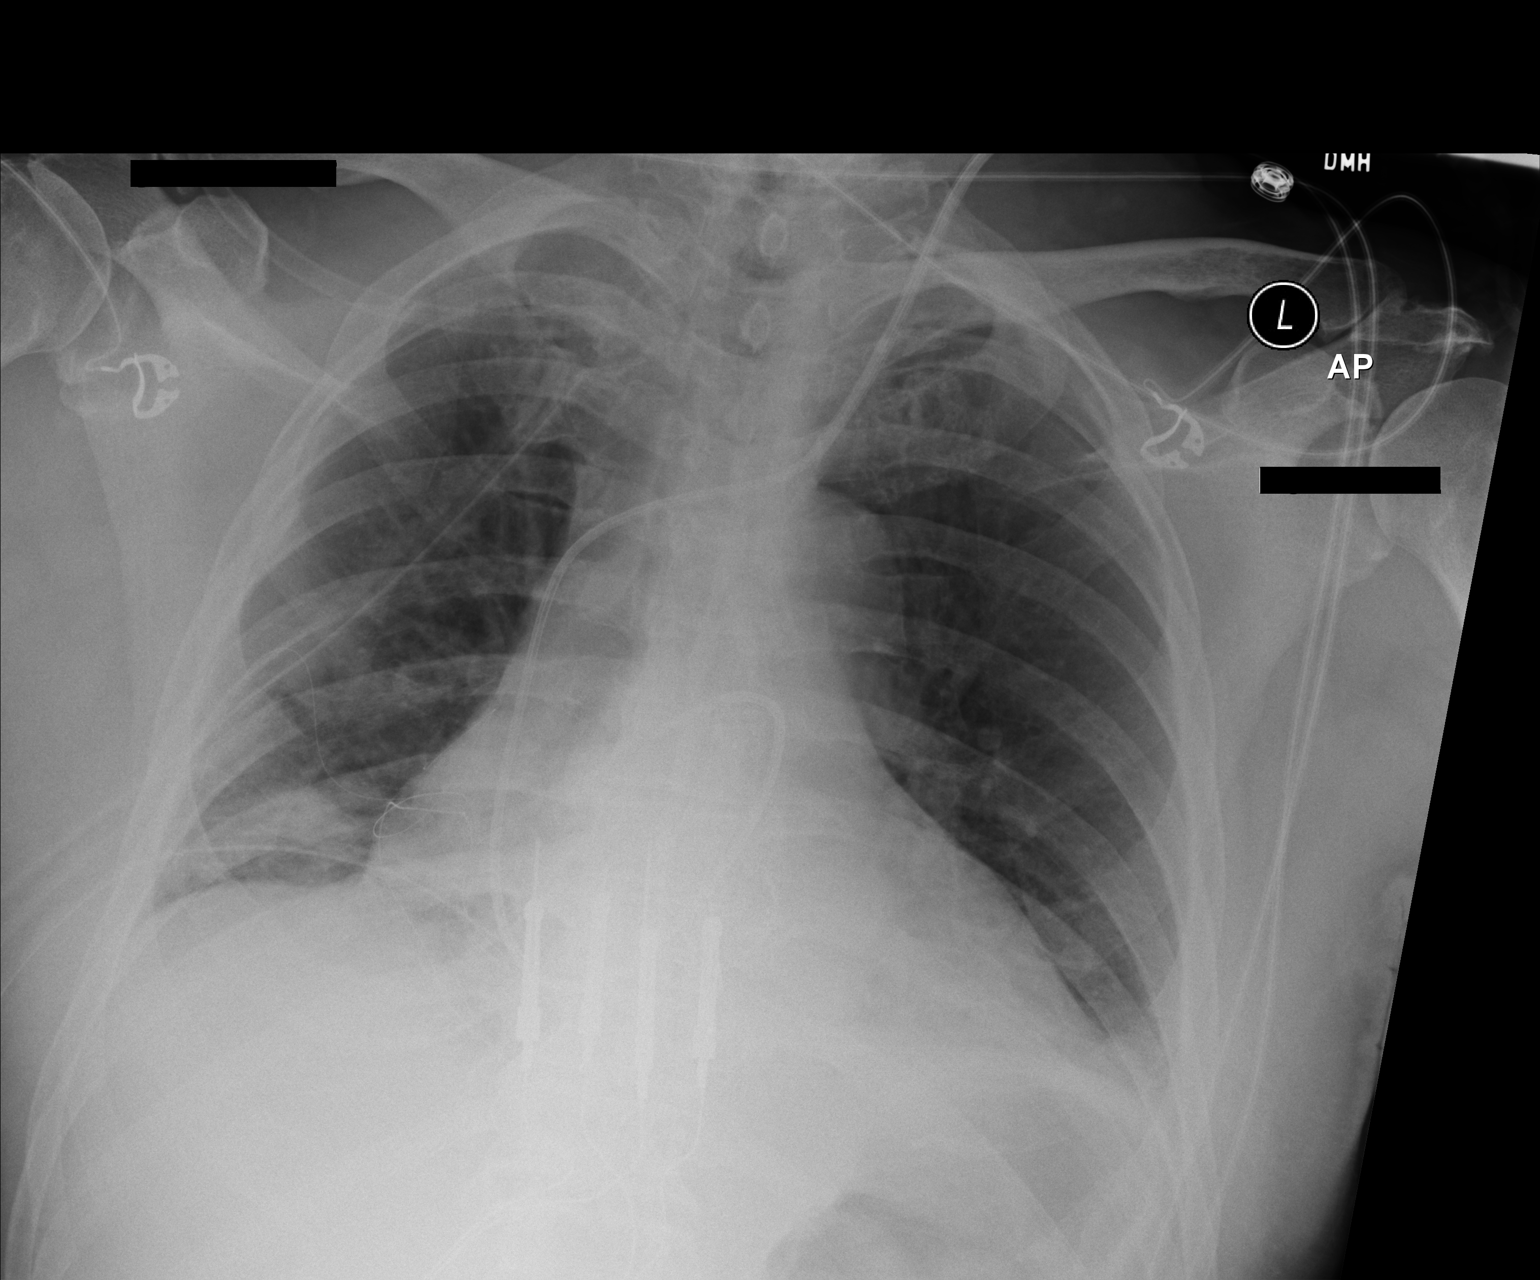

[1 of 1 positions shown; findings below may reference images not displayed]

FINDINGS: The endotracheal tube has been removed.  The left IJ
pulmonary arterial catheter is identified with tip in the right
main pulmonary artery.  Right-sided chest tube is noted with tip in
the medial aspect of the right apex.  Heart size appears moderately
enlarged.  There is atelectasis identified in both lung bases which
is unchanged from previous exam.
IMPRESSION: 1.  Interval removal of endotracheal tube.
2.  Persistent low lung volumes with bibasilar atelectasis

## 2014-08-01 ENCOUNTER — Ambulatory Visit (INDEPENDENT_AMBULATORY_CARE_PROVIDER_SITE_OTHER): Payer: 59 | Admitting: Internal Medicine

## 2014-08-01 ENCOUNTER — Encounter: Payer: Self-pay | Admitting: Internal Medicine

## 2014-08-01 VITALS — BP 128/82 | HR 72 | Temp 97.7°F | Resp 16 | Ht 72.0 in | Wt 223.0 lb

## 2014-08-01 DIAGNOSIS — E669 Obesity, unspecified: Secondary | ICD-10-CM

## 2014-08-01 DIAGNOSIS — Z79899 Other long term (current) drug therapy: Secondary | ICD-10-CM

## 2014-08-01 DIAGNOSIS — Z125 Encounter for screening for malignant neoplasm of prostate: Secondary | ICD-10-CM

## 2014-08-01 DIAGNOSIS — Z1212 Encounter for screening for malignant neoplasm of rectum: Secondary | ICD-10-CM

## 2014-08-01 DIAGNOSIS — E559 Vitamin D deficiency, unspecified: Secondary | ICD-10-CM

## 2014-08-01 DIAGNOSIS — N183 Chronic kidney disease, stage 3 unspecified: Secondary | ICD-10-CM

## 2014-08-01 DIAGNOSIS — Z1331 Encounter for screening for depression: Secondary | ICD-10-CM

## 2014-08-01 DIAGNOSIS — Z9181 History of falling: Secondary | ICD-10-CM

## 2014-08-01 DIAGNOSIS — E1122 Type 2 diabetes mellitus with diabetic chronic kidney disease: Secondary | ICD-10-CM

## 2014-08-01 DIAGNOSIS — E785 Hyperlipidemia, unspecified: Secondary | ICD-10-CM

## 2014-08-01 DIAGNOSIS — I10 Essential (primary) hypertension: Secondary | ICD-10-CM

## 2014-08-01 LAB — BASIC METABOLIC PANEL WITH GFR
BUN: 22 mg/dL (ref 6–23)
CALCIUM: 9.8 mg/dL (ref 8.4–10.5)
CHLORIDE: 102 meq/L (ref 96–112)
CO2: 21 mEq/L (ref 19–32)
CREATININE: 1.5 mg/dL — AB (ref 0.50–1.35)
GFR, Est African American: 55 mL/min — ABNORMAL LOW
GFR, Est Non African American: 47 mL/min — ABNORMAL LOW
Glucose, Bld: 115 mg/dL — ABNORMAL HIGH (ref 70–99)
Potassium: 4.4 mEq/L (ref 3.5–5.3)
Sodium: 137 mEq/L (ref 135–145)

## 2014-08-01 LAB — HEMOGLOBIN A1C
Hgb A1c MFr Bld: 6.5 % — ABNORMAL HIGH (ref <5.7)
MEAN PLASMA GLUCOSE: 140 mg/dL — AB (ref <117)

## 2014-08-01 LAB — CBC WITH DIFFERENTIAL/PLATELET
BASOS ABS: 0.1 10*3/uL (ref 0.0–0.1)
Basophils Relative: 2 % — ABNORMAL HIGH (ref 0–1)
Eosinophils Absolute: 0.1 10*3/uL (ref 0.0–0.7)
Eosinophils Relative: 2 % (ref 0–5)
HCT: 46.3 % (ref 39.0–52.0)
Hemoglobin: 16 g/dL (ref 13.0–17.0)
LYMPHS ABS: 1.7 10*3/uL (ref 0.7–4.0)
LYMPHS PCT: 46 % (ref 12–46)
MCH: 30.7 pg (ref 26.0–34.0)
MCHC: 34.6 g/dL (ref 30.0–36.0)
MCV: 88.9 fL (ref 78.0–100.0)
MPV: 11.6 fL (ref 8.6–12.4)
Monocytes Absolute: 0.2 10*3/uL (ref 0.1–1.0)
Monocytes Relative: 6 % (ref 3–12)
NEUTROS PCT: 44 % (ref 43–77)
Neutro Abs: 1.7 10*3/uL (ref 1.7–7.7)
PLATELETS: 277 10*3/uL (ref 150–400)
RBC: 5.21 MIL/uL (ref 4.22–5.81)
RDW: 14.2 % (ref 11.5–15.5)
WBC: 3.8 10*3/uL — ABNORMAL LOW (ref 4.0–10.5)

## 2014-08-01 LAB — LIPID PANEL
Cholesterol: 186 mg/dL (ref 0–200)
HDL: 48 mg/dL (ref >=40)
LDL CALC: 121 mg/dL — AB (ref 0–99)
TRIGLYCERIDES: 87 mg/dL (ref <150)
Total CHOL/HDL Ratio: 3.9 Ratio
VLDL: 17 mg/dL (ref 0–40)

## 2014-08-01 LAB — HEPATIC FUNCTION PANEL
ALK PHOS: 44 U/L (ref 39–117)
ALT: 20 U/L (ref 0–53)
AST: 23 U/L (ref 0–37)
Albumin: 4.5 g/dL (ref 3.5–5.2)
BILIRUBIN INDIRECT: 0.5 mg/dL (ref 0.2–1.2)
Bilirubin, Direct: 0.2 mg/dL (ref 0.0–0.3)
Total Bilirubin: 0.7 mg/dL (ref 0.2–1.2)
Total Protein: 7.5 g/dL (ref 6.0–8.3)

## 2014-08-01 LAB — TSH: TSH: 1.006 u[IU]/mL (ref 0.350–4.500)

## 2014-08-01 LAB — MAGNESIUM: Magnesium: 1.8 mg/dL (ref 1.5–2.5)

## 2014-08-01 NOTE — Progress Notes (Signed)
Patient ID: [name], male   DOB: 1945/06/28, 69 y.o.   MRN: [id number]   Annual Comprehensive Examination  This very nice 70 y.o.male presents for complete physical.  Patient has been followed for HTN, T2_NIDDMPrediabetes, Hyperlipidemia, and Vitamin D Deficiency.   HTN predates since 2005. Patient's BP has been controlled at home.Today's BP: 128/82 mmHg. Patient has hx/o unprovoked PE in 2010 and was anticoagulated and later d/c'd until 2012 when he had a 2sd episode of PE after an orthopedic procedure and has been on coumadin since followed at [facility].  In March 2014, he had a minimally invasive mitral Valvuloplasty for severe MR by Dr Barry Dienes.  Patient denies any cardiac symptoms as chest pain, palpitations, shortness of breath, dizziness or ankle swelling.   Patient's hyperlipidemia is controlled with diet and medications. Patient denies myalgias or other medication SE's. Last lipids were 08/01/2014: Cholesterol, Total 186; HDL-C 48; LDL (calc) 121*; Triglycerides 87 on      Patient has T2_NIDDM since 2002 which he has controlled by diet and patient denies reactive hypoglycemic symptoms, visual blurring, diabetic polys or paresthesias. Last A1c was  6.5% on 08/01/2014.       Finally, patient has history of Vitamin D Deficiency of 30 in May 2013 and last vitamin D was 44 on 04/24/2014.      Medication Sig  . acetaminophen  500 MG tablet Take every 6  hours as needed for pain.  . benazepril 10 MG tablet Take 10 mg by mouth daily.  Marland Kitchen VITAMIN D   TAKING 5,000 UNITS 4 TIMES A WEEK AN 2,000 UNITS 3 TIMES A WEEK  . LOTRISONE cream Apply 1 application topically 2  times daily. PRN  . fenofibrate  134 MG capsule Take 1 capsule  every evening.  . fexofenadine  30 MG tablet Take  as needed.   . Loratadine 10 MG CAPS Take by mouth.  Marland Kitchen NASONEX  nasal spray Place 2 sprays into the nose daily.  Marland Kitchen warfarin (COUMADIN) 5 MG tablet TAKE 2 TAB DAILY EXCEPT 1 TABLET ON TUESDAYS, THURSDAYS AND  SATURDAYS   . ANUSOL-HC 2.5 % rectal cream Apply to hemorrhoids 4 x daily as needed   Allergies  Allergen Reactions  . Statins     REACTION: muscle aches  . Welchol [Colesevelam Hcl]    Past Medical History  Diagnosis Date  . Pulmonary embolism     march 2010, 2012              /   DVT x  2  LEFT 2010  . Coronary artery disease     non-obstructive by cath 2009  . Pre-diabetes   . Peripheral vascular disease   . Severe mitral regurgitation by prior echocardiogram   . Sleep apnea     STOP BANG SCORE 4  . S/P mitral valve repair 08/15/2012    Complex valvuloplasty including triangular resection of posterior leaflet, artificial Gore-tex neocord placement x4 and Sorin Memo 3D ring annuloplasty via right mini thoracotomy approach  . Hyperlipidemia   . Hypertension     Clearance with note Dr Marvel Plan on chart,  Lovonox bridging  ordered by Dr Sanjuana Kava    EKG 9/12, chst 9/12 EPIC  . Heart murmur   . Seasonal allergies   . Osteoarthritis   . Vitamin D deficiency   . Degenerative joint disease   . Allergy     SEASONAL  . Cataract     SMALL IN LEFT EYE  Health Maintenance  Topic Date Due  . FOOT EXAM  08/21/1955  . OPHTHALMOLOGY EXAM  08/21/1955  . ZOSTAVAX  08/22/2006  . PNA vac Low Risk Adult (2 of 2 - PCV13) 05/11/2013  . TETANUS/TDAP  11/21/2013  . URINE MICROALBUMIN  05/25/2014  . INFLUENZA VACCINE  10/21/2014  . HEMOGLOBIN A1C  10/23/2014  . COLONOSCOPY  07/11/2024   Immunization History  Administered Date(s) Administered  . Influenza, High Dose Seasonal PF 12/20/2013  . Influenza-Unspecified 01/20/2013  . Pneumococcal-Unspecified 05/11/2012  . Td 11/22/2003   Past Surgical History  Procedure Laterality Date  . Achillis tendon      repair 20 years ago  . Hip arthroplasty      11/06/07  . Total hip arthroplasty  08/24/2011    Procedure: TOTAL HIP ARTHROPLASTY ANTERIOR APPROACH;  Surgeon: Shelda Pal, MD;  Location: [facility] ORS;  Service: Orthopedics;  Laterality:  Right;  . Tee without cardioversion N/A 07/04/2012    Procedure: TRANSESOPHAGEAL ECHOCARDIOGRAM (TEE);  Surgeon: Laurey Morale, MD;  Location: [facility] ENDOSCOPY;  Service: Cardiovascular;  Laterality: N/A;  . Cardiac catheterization    . Mitral valve repair Right 08/15/2012    Procedure: MINIMALLY INVASIVE MITRAL VALVE REPAIR (MVR);  Surgeon: Purcell Nails, MD;  Location: [facility] OR;  Service: Open Heart Surgery;  Laterality: Right;  . Intraoperative transesophageal echocardiogram N/A 08/15/2012    Procedure: INTRAOPERATIVE TRANSESOPHAGEAL ECHOCARDIOGRAM;  Surgeon: Purcell Nails, MD;  Location: [facility] OR;  Service: Open Heart Surgery;  Laterality: N/A;  . Colonoscopy     Family History  Problem Relation Age of Onset  . Coronary artery disease    . Heart attack Father   . Heart attack Brother 60  . Hypertension Father   . Hypertension Sister   . Hypertension Brother   . Hyperlipidemia Sister   . Diabetes Sister   . Diabetes Brother    History   Social History  . Marital Status: Married    Spouse Name: N/A  . Number of Children: 3  . Years of Education: N/A   Occupational History  . Industrial hygeinist    Social History Main Topics  . Smoking status: Former Smoker    Types: Cigars    Quit date: 03/22/1973  . Smokeless tobacco: Former Neurosurgeon    Types: Chew    Quit date: 08/15/2012  . Alcohol Use: No  . Drug Use: No  . Sexual Activity: Not Currently   Other Topics Concern  . Not on file   Social History Narrative    ROS Constitutional: Denies fever, chills, weight loss/gain, headaches, insomnia,  night sweats or change in appetite. Does c/o fatigue. Eyes: Denies redness, blurred vision, diplopia, discharge, itchy or watery eyes.  ENT: Denies discharge, congestion, post nasal drip, epistaxis, sore throat, earache, hearing loss, dental pain, Tinnitus, Vertigo, Sinus pain or snoring.  Cardio: Denies chest pain, palpitations, irregular heartbeat, syncope, dyspnea, diaphoresis,  orthopnea, PND, claudication or edema Respiratory: denies cough, dyspnea, DOE, pleurisy, hoarseness, laryngitis or wheezing.  Gastrointestinal: Denies dysphagia, heartburn, reflux, water brash, pain, cramps, nausea, vomiting, bloating, diarrhea, constipation, hematemesis, melena, hematochezia, jaundice or hemorrhoids Genitourinary: Denies dysuria, frequency, urgency, nocturia, hesitancy, discharge, hematuria or flank pain Musculoskeletal: Denies arthralgia, myalgia, stiffness, Jt. Swelling, pain, limp or strain/sprain. Denies Falls. Skin: Denies puritis, rash, hives, warts, acne, eczema or change in skin lesion Neuro: No weakness, tremor, incoordination, spasms, paresthesia or pain Psychiatric: Denies confusion, memory loss or sensory loss. Denies Depression. Endocrine: Denies change in weight, skin,  hair change, nocturia, and paresthesia, diabetic polys, visual blurring or hyper / hypo glycemic episodes.  Heme/Lymph: No excessive bleeding, bruising or enlarged lymph nodes.  Physical Exam  BP 128/82 m  Pulse 72  Temp 97.7 F   Resp 16  Ht 6'   Wt 223 lb     BMI 30.24   General Appearance: Well nourished, in no apparent distress. Eyes: PERRLA, EOMs, conjunctiva no swelling or erythema, normal fundi and vessels. Sinuses: No frontal/maxillary tenderness ENT/Mouth: EACs patent / TMs  nl. Nares clear without erythema, swelling, mucoid exudates. Oral hygiene is good. No erythema, swelling, or exudate. Tongue normal, non-obstructing. Tonsils not swollen or erythematous. Hearing normal.  Neck: Supple, thyroid normal. No bruits, nodes or JVD. Respiratory: Respiratory effort normal.  BS equal and clear bilateral without rales, rhonci, wheezing or stridor. Cardio: Heart sounds are normal with regular rate and rhythm and no murmurs, rubs or gallops. Peripheral pulses are normal and equal bilaterally without edema. No aortic or femoral bruits. Chest: symmetric with normal excursions and percussion.   Abdomen: Flat, soft, with bowel sounds. Nontender, no guarding, rebound, hernias, masses, or organomegaly.  Lymphatics: Non tender without lymphadenopathy.  Genitourinary: No hernias.Testes nl. DRE - prostate nl for age - smooth & firm w/o nodules. Musculoskeletal: Full ROM all peripheral extremities, joint stability, 5/5 strength, and normal gait. Skin: Warm and dry without rashes, lesions, cyanosis, clubbing or  ecchymosis.  Neuro: Cranial nerves intact, reflexes equal bilaterally. Normal muscle tone, no cerebellar symptoms. Sensation intact.  Pysch: Awake and oriented X 3 with normal affect, insight and judgment appropriate.   Assessment and Plan  1. Essential hypertension  - [place], RETROPERITNL ABD,  LTD - TSH  2. Hyperlipidemia  - Lipid panel  3. T2_NIDDM w/ CKD2  - Microalbumin / creatinine urine ratio - Hemoglobin A1c - Insulin, random  4. Vitamin D deficiency  - Vit D  25 hydroxy   5. Obesity   6. Screening for rectal cancer   7. Prostate cancer screening  - PSA  8. Depression screen   9. At low risk for fall   10. Medication management   - Urine Microscopic - CBC with Differential/Platelet - BASIC METABOLIC PANEL WITH GFR - Hepatic function panel - Magnesium      Continue prudent diet as discussed, weight control, BP monitoring, regular exercise, and medications as discussed.  Discussed med effects and SE's. Routine screening labs and tests as requested with regular follow-up as recommended. Over 40 minutes of exam, counseling &  chart review was performed

## 2014-08-01 NOTE — Patient Instructions (Signed)
++++++++++++++++++++++++++++++++++  Recommend Low dose or baby Aspirin 81 mg daily   To reduce risk of Colon Cancer 20 %, Skin Cancer 26 % , Melanoma 46% and   Pancreatic cancer 60%  +++++++++++++++++++++++++++++++++ Vitamin D goal is between 70-100.   Please make sure that you are taking your Vitamin D as directed.   It is very important as a natural antiinflammatory   helping hair, skin, and nails, as well as reducing stroke and heart attack risk.   It helps your bones and helps with mood.  It also decreases numerous cancer risks so please take it as directed.   Low Vit D is associated with a 200-300% higher risk for CANCER   and 200-300% higher risk for HEART   ATTACK  &  STROKE.    ...................................................................................  It is also associated with higher death rate at younger ages,   autoimmune diseases like Rheumatoid arthritis, Lupus, Multiple Sclerosis.     Also many other serious conditions, like depression, Alzheimer's  Dementia, infertility, muscle aches, fatigue, fibromyalgia - just to name a few.  ++++++++++++++++++++++++++++++++++++++++ Recommend the book "The END of DIETING" by Dr Joel Fuhrman   & the book "The END of DIABETES " by Dr Joel Fuhrman  At [employer] - get book & Audio CD's     Being diabetic has a  300% increased risk for heart attack, stroke, cancer, and alzheimer- type vascular dementia. It is very important that you work harder with diet by avoiding all foods that are white. Avoid white rice (brown & wild rice is OK), white potatoes (sweetpotatoes in moderation is OK), White bread or wheat bread or anything made out of white flour like bagels, donuts, rolls, buns, biscuits, cakes, pastries, cookies, pizza crust, and pasta (made from white flour & egg whites) - vegetarian pasta or spinach or wheat pasta is OK. Multigrain breads like Arnold's or [employer], or multigrain sandwich thins or  flatbreads.  Diet, exercise and weight loss can reverse and cure diabetes in the early stages.  Diet, exercise and weight loss is very important in the control and prevention of complications of diabetes which affects every system in your body, ie. Brain - dementia/stroke, eyes - glaucoma/blindness, heart - heart attack/heart failure, kidneys - dialysis, stomach - gastric paralysis, intestines - malabsorption, nerves - severe painful neuritis, circulation - gangrene & loss of a leg(s), and finally cancer and Alzheimers.    I recommend avoid fried & greasy foods,  sweets/candy, white rice (brown or wild rice or Quinoa is OK), white potatoes (sweet potatoes are OK) - anything made from white flour - bagels, doughnuts, rolls, buns, biscuits,white and wheat breads, pizza crust and traditional pasta made of white flour & egg white(vegetarian pasta or spinach or wheat pasta is OK).  Multi-grain bread is OK - like multi-grain flat bread or sandwich thins. Avoid alcohol in excess. Exercise is also important.    Eat all the vegetables you want - avoid meat, especially red meat and dairy - especially cheese.  Cheese is the most concentrated form of trans-fats which is the worst thing to clog up our arteries. Veggie cheese is OK which can be found in the fresh produce section at [employer] or [employer] or [employer]  ++++++++++++++++++++++++++++++++++++++++++++++++++++++++ Preventive Care for Adults A healthy lifestyle and preventive care can promote health and wellness. Preventive health guidelines for men include the following key practices:  A routine yearly physical is a good way to check with your health care provider about your   health and preventative screening. It is a chance to share any concerns and updates on your health and to receive a thorough exam.  Visit your dentist for a routine exam and preventative care every 6 months. Brush your teeth twice a day and floss once a day. Good oral hygiene  prevents tooth decay and gum disease.  The frequency of eye exams is based on your age, health, family medical history, use of contact lenses, and other factors. Follow your health care provider's recommendations for frequency of eye exams.  Eat a healthy diet. Foods such as vegetables, fruits, whole grains, low-fat dairy products, and lean protein foods contain the nutrients you need without too many calories. Decrease your intake of foods high in solid fats, added sugars, and salt. Eat the right amount of calories for you.Get information about a proper diet from your health care provider, if necessary.  Regular physical exercise is one of the most important things you can do for your health. Most adults should get at least 150 minutes of moderate-intensity exercise (any activity that increases your heart rate and causes you to sweat) each week. In addition, most adults need muscle-strengthening exercises on 2 or more days a week.  Maintain a healthy weight. The body mass index (BMI) is a screening tool to identify possible weight problems. It provides an estimate of body fat based on height and weight. Your health care provider can find your BMI and can help you achieve or maintain a healthy weight.For adults 20 years and older:  A BMI below 18.5 is considered underweight.  A BMI of 18.5 to 24.9 is normal.  A BMI of 25 to 29.9 is considered overweight.  A BMI of 30 and above is considered obese.  Maintain normal blood lipids and cholesterol levels by exercising and minimizing your intake of saturated fat. Eat a balanced diet with plenty of fruit and vegetables. Blood tests for lipids and cholesterol should begin at age 20 and be repeated every 5 years. If your lipid or cholesterol levels are high, you are over 50, or you are at high risk for heart disease, you may need your cholesterol levels checked more frequently.Ongoing high lipid and cholesterol levels should be treated with medicines if  diet and exercise are not working.  If you smoke, find out from your health care provider how to quit. If you do not use tobacco, do not start.  Lung cancer screening is recommended for adults aged 55-80 years who are at high risk for developing lung cancer because of a history of smoking. A yearly low-dose CT scan of the lungs is recommended for people who have at least a 30-pack-year history of smoking and are a current smoker or have quit within the past 15 years. A pack year of smoking is smoking an average of 1 pack of cigarettes a day for 1 year (for example: 1 pack a day for 30 years or 2 packs a day for 15 years). Yearly screening should continue until the smoker has stopped smoking for at least 15 years. Yearly screening should be stopped for people who develop a health problem that would prevent them from having lung cancer treatment.  If you choose to drink alcohol, do not have more than 2 drinks per day. One drink is considered to be 12 ounces (355 mL) of beer, 5 ounces (148 mL) of wine, or 1.5 ounces (44 mL) of liquor.  Avoid use of street drugs. Do not share needles with anyone. Ask   for help if you need support or instructions about stopping the use of drugs.  High blood pressure causes heart disease and increases the risk of stroke. Your blood pressure should be checked at least every 1-2 years. Ongoing high blood pressure should be treated with medicines, if weight loss and exercise are not effective.  If you are 45-79 years old, ask your health care provider if you should take aspirin to prevent heart disease.  Diabetes screening involves taking a blood sample to check your fasting blood sugar level. Testing should be considered at a younger age or be carried out more frequently if you are overweight and have at least 1 risk factor for diabetes.  Colorectal cancer can be detected and often prevented. Most routine colorectal cancer screening begins at the age of 50 and continues  through age 75. However, your health care provider may recommend screening at an earlier age if you have risk factors for colon cancer. On a yearly basis, your health care provider may provide home test kits to check for hidden blood in the stool. Use of a small camera at the end of a tube to directly examine the colon (sigmoidoscopy or colonoscopy) can detect the earliest forms of colorectal cancer. Talk to your health care provider about this at age 50, when routine screening begins. Direct exam of the colon should be repeated every 5-10 years through age 75, unless early forms of precancerous polyps or small growths are found.  Hepatitis C blood testing is recommended for all people born from 1945 through 1965 and any individual with known risks for hepatitis C.  Screening for abdominal aortic aneurysm (AAA)  by ultrasound is recommended for people who have history of high blood pressure or who are current or former smokers.  Healthy men should  receive prostate-specific antigen (PSA) blood tests as part of routine cancer screening. Talk with your health care provider about prostate cancer screening.  Testicular cancer screening is  recommended for adult males. Screening includes self-exam, a health care provider exam, and other screening tests. Consult with your health care provider about any symptoms you have or any concerns you have about testicular cancer.  Use sunscreen. Apply sunscreen liberally and repeatedly throughout the day. You should seek shade when your shadow is shorter than you. Protect yourself by wearing long sleeves, pants, a wide-brimmed hat, and sunglasses year round, whenever you are outdoors.  Once a month, do a whole-body skin exam, using a mirror to look at the skin on your back. Tell your health care provider about new moles, moles that have irregular borders, moles that are larger than a pencil eraser, or moles that have changed in shape or color.  Stay current with  required vaccines (immunizations).  Influenza vaccine. All adults should be immunized every year.  Tetanus, diphtheria, and acellular pertussis (Td, Tdap) vaccine. An adult who has not previously received Tdap or who does not know his vaccine status should receive 1 dose of Tdap. This initial dose should be followed by tetanus and diphtheria toxoids (Td) booster doses every 10 years. Adults with an unknown or incomplete history of completing a 3-dose immunization series with Td-containing vaccines should begin or complete a primary immunization series including a Tdap dose. Adults should receive a Td booster every 10 years.  Zoster vaccine. One dose is recommended for adults aged 60 years or older unless certain conditions are present.    PREVNAR - Pneumococcal 13-valent conjugate (PCV13) vaccine. When indicated, a person who is   uncertain of his immunization history and has no record of immunization should receive the PCV13 vaccine. An adult aged 19 years or older who has certain medical conditions and has not been previously immunized should receive 1 dose of PCV13 vaccine. This PCV13 should be followed with a dose of pneumococcal polysaccharide (PPSV23) vaccine. The PPSV23 vaccine dose should be obtained at least 8 weeks after the dose of PCV13 vaccine. An adult aged 19 years or older who has certain medical conditions and previously received 1 or more doses of PPSV23 vaccine should receive 1 dose of PCV13. The PCV13 vaccine dose should be obtained 1 or more years after the last PPSV23 vaccine dose.    PNEUMOVAX - Pneumococcal polysaccharide (PPSV23) vaccine. When PCV13 is also indicated, PCV13 should be obtained first. All adults aged 65 years and older should be immunized. An adult younger than age 65 years who has certain medical conditions should be immunized. Any person who resides in a nursing home or long-term care facility should be immunized. An adult smoker should be immunized. People with  an immunocompromised condition and certain other conditions should receive both PCV13 and PPSV23 vaccines. People with human immunodeficiency virus (HIV) infection should be immunized as soon as possible after diagnosis. Immunization during chemotherapy or radiation therapy should be avoided. Routine use of PPSV23 vaccine is not recommended for American Indians, [place] Natives, or people younger than 65 years unless there are medical conditions that require PPSV23 vaccine. When indicated, people who have unknown immunization and have no record of immunization should receive PPSV23 vaccine. One-time revaccination 5 years after the first dose of PPSV23 is recommended for people aged 19-64 years who have chronic kidney failure, nephrotic syndrome, asplenia, or immunocompromised conditions. People who received 1-2 doses of PPSV23 before age 65 years should receive another dose of PPSV23 vaccine at age 65 years or later if at least 5 years have passed since the previous dose. Doses of PPSV23 are not needed for people immunized with PPSV23 at or after age 65 years.    Hepatitis A vaccine. Adults who wish to be protected from this disease, have certain high-risk conditions, work with hepatitis A-infected animals, work in hepatitis A research labs, or travel to or work in countries with a high rate of hepatitis A should be immunized. Adults who were previously unvaccinated and who anticipate close contact with an international adoptee during the first 60 days after arrival in the [place] from a country with a high rate of hepatitis A should be immunized.    Hepatitis B vaccine. Adults should be immunized if they wish to be protected from this disease, have certain high-risk conditions, may be exposed to blood or other infectious body fluids, are household contacts or sex partners of hepatitis B positive people, are clients or workers in certain care facilities, or travel to or work in countries with a high  rate of hepatitis B.   Preventive Service / Frequency   Ages 65 and over  Blood pressure check.  Lipid and cholesterol check.  Lung cancer screening. / Every year if you are aged 55-80 years and have a 30-pack-year history of smoking and currently smoke or have quit within the past 15 years. Yearly screening is stopped once you have quit smoking for at least 15 years or develop a health problem that would prevent you from having lung cancer treatment.  Fecal occult blood test (FOBT) of stool. You may not have to do this test if you get a   colonoscopy every 10 years.  Flexible sigmoidoscopy** or colonoscopy.** / Every 5 years for a flexible sigmoidoscopy or every 10 years for a colonoscopy beginning at age 50 and continuing until age 75.  Hepatitis C blood test.** / For all people born from 1945 through 1965 and any individual with known risks for hepatitis C.  Abdominal aortic aneurysm (AAA) screening./ Screening current or former smokers or have Hypertension.  Skin self-exam. / Monthly.  Influenza vaccine. / Every year.  Tetanus, diphtheria, and acellular pertussis (Tdap/Td) vaccine.** / 1 dose of Td every 10 years.   Zoster vaccine.** / 1 dose for adults aged 60 years or older.         Pneumococcal 13-valent conjugate (PCV13) vaccine.    Pneumococcal polysaccharide (PPSV23) vaccine.     Hepatitis A vaccine.** / Consult your health care provider.  Hepatitis B vaccine.** / Consult your health care provider. Screening for abdominal aortic aneurysm (AAA)  by ultrasound is recommended for people who have history of high blood pressure or who are current or former smokers. 

## 2014-08-02 ENCOUNTER — Other Ambulatory Visit: Payer: Self-pay | Admitting: Internal Medicine

## 2014-08-02 DIAGNOSIS — E782 Mixed hyperlipidemia: Secondary | ICD-10-CM

## 2014-08-02 LAB — INSULIN, RANDOM: Insulin: 13.8 u[IU]/mL (ref 2.0–19.6)

## 2014-08-02 LAB — PSA: PSA: 0.83 ng/mL (ref <=4.00)

## 2014-08-02 LAB — MICROALBUMIN / CREATININE URINE RATIO
Creatinine, Urine: 155.6 mg/dL
Microalb Creat Ratio: 30.2 mg/g — ABNORMAL HIGH (ref 0.0–30.0)
Microalb, Ur: 4.7 mg/dL — ABNORMAL HIGH (ref <2.0)

## 2014-08-02 LAB — URINALYSIS, MICROSCOPIC ONLY
BACTERIA UA: NONE SEEN
CRYSTALS: NONE SEEN
Casts: NONE SEEN
Squamous Epithelial / LPF: NONE SEEN

## 2014-08-02 LAB — VITAMIN D 25 HYDROXY (VIT D DEFICIENCY, FRACTURES): VIT D 25 HYDROXY: 38 ng/mL (ref 30–100)

## 2014-08-02 MED ORDER — EZETIMIBE 10 MG PO TABS: ORAL_TABLET | ORAL | Status: DC

## 2014-08-13 ENCOUNTER — Other Ambulatory Visit (HOSPITAL_COMMUNITY): Payer: 59

## 2014-08-13 ENCOUNTER — Other Ambulatory Visit: Payer: Self-pay

## 2014-08-13 ENCOUNTER — Ambulatory Visit (HOSPITAL_COMMUNITY): Payer: 59 | Attending: Cardiovascular Disease

## 2014-08-13 DIAGNOSIS — I059 Rheumatic mitral valve disease, unspecified: Secondary | ICD-10-CM | POA: Insufficient documentation

## 2014-08-13 DIAGNOSIS — I340 Nonrheumatic mitral (valve) insufficiency: Secondary | ICD-10-CM | POA: Diagnosis not present

## 2014-08-26 ENCOUNTER — Telehealth: Payer: Self-pay | Admitting: *Deleted

## 2014-08-26 NOTE — Telephone Encounter (Signed)
Patient called and states he thinks Zetia is causing numbness in his arms and cramping.  Patient stopped Zetia.  Per Dr Oneta Rack, Wel[place] Picking is absorbed in the GI tract and can not cause these symptoms.  Patient aware and will Zetia again.

## 2014-08-28 ENCOUNTER — Ambulatory Visit (INDEPENDENT_AMBULATORY_CARE_PROVIDER_SITE_OTHER): Payer: 59 | Admitting: *Deleted

## 2014-08-28 DIAGNOSIS — I340 Nonrheumatic mitral (valve) insufficiency: Secondary | ICD-10-CM

## 2014-08-28 DIAGNOSIS — Z7901 Long term (current) use of anticoagulants: Secondary | ICD-10-CM | POA: Diagnosis not present

## 2014-08-28 DIAGNOSIS — Z9889 Other specified postprocedural states: Secondary | ICD-10-CM | POA: Diagnosis not present

## 2014-08-28 DIAGNOSIS — I2699 Other pulmonary embolism without acute cor pulmonale: Secondary | ICD-10-CM | POA: Diagnosis not present

## 2014-08-28 LAB — POCT INR: INR: 2.6

## 2014-09-09 ENCOUNTER — Other Ambulatory Visit: Payer: Self-pay | Admitting: Internal Medicine

## 2014-10-09 ENCOUNTER — Ambulatory Visit (INDEPENDENT_AMBULATORY_CARE_PROVIDER_SITE_OTHER): Payer: 59 | Admitting: *Deleted

## 2014-10-09 ENCOUNTER — Encounter: Payer: Self-pay | Admitting: Internal Medicine

## 2014-10-09 ENCOUNTER — Ambulatory Visit (INDEPENDENT_AMBULATORY_CARE_PROVIDER_SITE_OTHER): Payer: 59 | Admitting: Internal Medicine

## 2014-10-09 VITALS — BP 118/80 | HR 82 | Temp 98.0°F | Resp 18 | Ht 72.0 in | Wt 219.0 lb

## 2014-10-09 DIAGNOSIS — M545 Low back pain, unspecified: Secondary | ICD-10-CM

## 2014-10-09 DIAGNOSIS — I340 Nonrheumatic mitral (valve) insufficiency: Secondary | ICD-10-CM

## 2014-10-09 DIAGNOSIS — Z7901 Long term (current) use of anticoagulants: Secondary | ICD-10-CM

## 2014-10-09 DIAGNOSIS — Z9889 Other specified postprocedural states: Secondary | ICD-10-CM

## 2014-10-09 DIAGNOSIS — I2699 Other pulmonary embolism without acute cor pulmonale: Secondary | ICD-10-CM | POA: Diagnosis not present

## 2014-10-09 DIAGNOSIS — I2510 Atherosclerotic heart disease of native coronary artery without angina pectoris: Secondary | ICD-10-CM

## 2014-10-09 LAB — POCT INR: INR: 2.9

## 2014-10-09 MED ORDER — DIAZEPAM 5 MG PO TABS: 5.0000 mg | ORAL_TABLET | Freq: Three times a day (TID) | ORAL | Status: DC | PRN

## 2014-10-09 MED ORDER — PREDNISONE 20 MG PO TABS: ORAL_TABLET | ORAL | Status: DC

## 2014-10-09 MED ORDER — HYDROCODONE-ACETAMINOPHEN 5-325 MG PO TABS: 2.0000 | ORAL_TABLET | ORAL | Status: DC | PRN

## 2014-10-09 NOTE — Progress Notes (Signed)
   Subjective:    Patient ID: [name], male    DOB: 12/14/1945, 69 y.o.   MRN: [id number]  Back Pain Pertinent negatives include no chest pain, fever or numbness.  Patient presents with 3 weeks of back pain which has been going on intermittently.  He has no injury that he can think of. He reports the pain is in his lower lumbar paraspinal muscles on the right.  He reports that it hurts all the time but the catching is when it feels really bad. It hurts the worse with forward bending.  He reports that he has been trying some creams at home and also tylenol and neither medication really gives a lot of relief.  He reports that he he has not had any urinary problems.  No loss of bowel or bladder.  No saddle anesthesias.      Review of Systems  Constitutional: Negative for fever, chills and fatigue.  Respiratory: Negative for chest tightness and shortness of breath.   Cardiovascular: Negative for chest pain and palpitations.  Gastrointestinal: Negative for nausea and vomiting.  Musculoskeletal: Positive for back pain. Negative for joint swelling, arthralgias and neck stiffness.  Neurological: Negative for numbness.       Objective:   Physical Exam  Constitutional: He is oriented to person, place, and time. He appears well-developed and well-nourished. No distress.  HENT:  Head: Normocephalic and atraumatic.  Mouth/Throat: Oropharynx is clear and moist. No oropharyngeal exudate.  Eyes: Conjunctivae are normal. No scleral icterus.  Neck: Normal range of motion. Neck supple. No JVD present. No thyromegaly present.  Cardiovascular: Normal rate, regular rhythm, normal heart sounds and intact distal pulses.  Exam reveals no gallop and no friction rub.   No murmur heard. Pulmonary/Chest: Effort normal and breath sounds normal. No respiratory distress. He has no wheezes. He has no rales. He exhibits no tenderness.  Abdominal: Soft. Bowel sounds are normal. He exhibits no distension and no mass.  There is no tenderness. There is no rebound and no guarding.  Musculoskeletal:  Patient rises slowly from sitting to standing.  They walk without an antalgic gait.  There is no evidence of erythema, ecchymosis, or gross deformity.  There is tenderness to palpation over right lumbar paraspinal muscles.  No bony spine tenderness to palpation.  Active ROM is limited due to pain especially with extension and forward bending.  Sensation to light touch is intact over all extremities.  Strength is symmetric and equal in all extremities.    Lymphadenopathy:    He has no cervical adenopathy.  Neurological: He is alert and oriented to person, place, and time.  Skin: Skin is warm and dry. He is not diaphoretic.  Psychiatric: He has a normal mood and affect. His behavior is normal. Judgment and thought content normal.  Nursing note and vitals reviewed.   Filed Vitals:   10/09/14 0953  BP: 118/80  Pulse: 82  Temp: 98 F (36.7 C)  Resp: 18          Assessment & Plan:    1. Right-sided low back pain without sciatica -No red flags for cauda equina -valium -hydrocodone prn for pain -prednisone -if no better in 1-2 weeks or alarm symptoms of cauda equina patient to call or go to ER.

## 2014-10-09 NOTE — Patient Instructions (Signed)
Please take the prednisone until it is all the way gone.  You can take the hydrocodone as you need it for pain.  This does have tylenol in it so please make sure that you don't take more than 4,000 mg in a day.  You can take the valium which is the muscle relaxer up to every 8 hours.  Do not drive on this medication.  Please make sure that you call me if you have loss of bowel or bladder.     Back Pain, Adult Low back pain is very common. About 1 in 5 people have back pain.The cause of low back pain is rarely dangerous. The pain often gets better over time.About half of people with a sudden onset of back pain feel better in just 2 weeks. About 8 in 10 people feel better by 6 weeks.  CAUSES Some common causes of back pain include:  Strain of the muscles or ligaments supporting the spine.  Wear and tear (degeneration) of the spinal discs.  Arthritis.  Direct injury to the back. DIAGNOSIS Most of the time, the direct cause of low back pain is not known.However, back pain can be treated effectively even when the exact cause of the pain is unknown.Answering your caregiver's questions about your overall health and symptoms is one of the most accurate ways to make sure the cause of your pain is not dangerous. If your caregiver needs more information, he or she may order lab work or imaging tests (X-rays or MRIs).However, even if imaging tests show changes in your back, this usually does not require surgery. HOME CARE INSTRUCTIONS For many people, back pain returns.Since low back pain is rarely dangerous, it is often a condition that people can learn to [facility] their own.   Remain active. It is stressful on the back to sit or stand in one place. Do not sit, drive, or stand in one place for more than 30 minutes at a time. Take short walks on level surfaces as soon as pain allows.Try to increase the length of time you walk each day.  Do not stay in bed.Resting more than 1 or 2 days can delay  your recovery.  Do not avoid exercise or work.Your body is made to move.It is not dangerous to be active, even though your back may hurt.Your back will likely heal faster if you return to being active before your pain is gone.  Pay attention to your body when you bend and lift. Many people have less discomfortwhen lifting if they bend their knees, keep the load close to their bodies,and avoid twisting. Often, the most comfortable positions are those that put less stress on your recovering back.  Find a comfortable position to sleep. Use a firm mattress and lie on your side with your knees slightly bent. If you lie on your back, put a pillow under your knees.  Only take over-the-counter or prescription medicines as directed by your caregiver. Over-the-counter medicines to reduce pain and inflammation are often the most helpful.Your caregiver may prescribe muscle relaxant drugs.These medicines help dull your pain so you can more quickly return to your normal activities and healthy exercise.  Put ice on the injured area.  Put ice in a plastic bag.  Place a towel between your skin and the bag.  Leave the ice on for 15-20 minutes, 03-04 times a day for the first 2 to 3 days. After that, ice and heat may be alternated to reduce pain and spasms.  Ask your  caregiver about trying back exercises and gentle massage. This may be of some benefit.  Avoid feeling anxious or stressed.Stress increases muscle tension and can worsen back pain.It is important to recognize when you are anxious or stressed and learn ways to manage it.Exercise is a great option. SEEK MEDICAL CARE IF:  You have pain that is not relieved with rest or medicine.  You have pain that does not improve in 1 week.  You have new symptoms.  You are generally not feeling well. SEEK IMMEDIATE MEDICAL CARE IF:   You have pain that radiates from your back into your legs.  You develop new bowel or bladder control  problems.  You have unusual weakness or numbness in your arms or legs.  You develop nausea or vomiting.  You develop abdominal pain.  You feel faint. Document Released: 03/08/2005 Document Revised: 09/07/2011 Document Reviewed: 07/10/2013 [facility] Patient Information 2015 [place], [place]. This information is not intended to replace advice given to you by your health care provider. Make sure you discuss any questions you have with your health care provider.

## 2014-10-14 ENCOUNTER — Ambulatory Visit (INDEPENDENT_AMBULATORY_CARE_PROVIDER_SITE_OTHER): Payer: 59 | Admitting: *Deleted

## 2014-10-14 DIAGNOSIS — I2699 Other pulmonary embolism without acute cor pulmonale: Secondary | ICD-10-CM | POA: Diagnosis not present

## 2014-10-14 DIAGNOSIS — Z9889 Other specified postprocedural states: Secondary | ICD-10-CM

## 2014-10-14 DIAGNOSIS — I340 Nonrheumatic mitral (valve) insufficiency: Secondary | ICD-10-CM | POA: Diagnosis not present

## 2014-10-14 DIAGNOSIS — Z7901 Long term (current) use of anticoagulants: Secondary | ICD-10-CM

## 2014-10-14 LAB — POCT INR: INR: 3.1

## 2014-11-11 ENCOUNTER — Other Ambulatory Visit: Payer: Self-pay | Admitting: Cardiovascular Disease

## 2014-11-18 ENCOUNTER — Encounter: Payer: Self-pay | Admitting: Internal Medicine

## 2014-11-18 ENCOUNTER — Ambulatory Visit (INDEPENDENT_AMBULATORY_CARE_PROVIDER_SITE_OTHER): Payer: 59 | Admitting: Internal Medicine

## 2014-11-18 VITALS — BP 118/80 | HR 68 | Temp 98.4°F | Resp 18 | Ht 72.0 in | Wt 221.0 lb

## 2014-11-18 DIAGNOSIS — N183 Chronic kidney disease, stage 3 unspecified: Secondary | ICD-10-CM

## 2014-11-18 DIAGNOSIS — Z79899 Other long term (current) drug therapy: Secondary | ICD-10-CM

## 2014-11-18 DIAGNOSIS — I2510 Atherosclerotic heart disease of native coronary artery without angina pectoris: Secondary | ICD-10-CM | POA: Diagnosis not present

## 2014-11-18 DIAGNOSIS — E1122 Type 2 diabetes mellitus with diabetic chronic kidney disease: Secondary | ICD-10-CM | POA: Diagnosis not present

## 2014-11-18 DIAGNOSIS — E785 Hyperlipidemia, unspecified: Secondary | ICD-10-CM

## 2014-11-18 DIAGNOSIS — I10 Essential (primary) hypertension: Secondary | ICD-10-CM

## 2014-11-18 DIAGNOSIS — E782 Mixed hyperlipidemia: Secondary | ICD-10-CM

## 2014-11-18 DIAGNOSIS — E559 Vitamin D deficiency, unspecified: Secondary | ICD-10-CM | POA: Diagnosis not present

## 2014-11-18 LAB — CBC WITH DIFFERENTIAL/PLATELET
Basophils Absolute: 0 10*3/uL (ref 0.0–0.1)
Basophils Relative: 1 % (ref 0–1)
EOS ABS: 0.1 10*3/uL (ref 0.0–0.7)
EOS PCT: 2 % (ref 0–5)
HCT: 45.5 % (ref 39.0–52.0)
Hemoglobin: 15.4 g/dL (ref 13.0–17.0)
LYMPHS ABS: 1.5 10*3/uL (ref 0.7–4.0)
Lymphocytes Relative: 34 % (ref 12–46)
MCH: 30.3 pg (ref 26.0–34.0)
MCHC: 33.8 g/dL (ref 30.0–36.0)
MCV: 89.4 fL (ref 78.0–100.0)
MONO ABS: 0.4 10*3/uL (ref 0.1–1.0)
MPV: 11.2 fL (ref 8.6–12.4)
Monocytes Relative: 8 % (ref 3–12)
Neutro Abs: 2.5 10*3/uL (ref 1.7–7.7)
Neutrophils Relative %: 55 % (ref 43–77)
Platelets: 265 10*3/uL (ref 150–400)
RBC: 5.09 MIL/uL (ref 4.22–5.81)
RDW: 14.1 % (ref 11.5–15.5)
WBC: 4.5 10*3/uL (ref 4.0–10.5)

## 2014-11-18 LAB — LIPID PANEL
CHOLESTEROL: 151 mg/dL (ref 125–200)
HDL: 48 mg/dL (ref >=40)
LDL CALC: 86 mg/dL (ref <130)
Total CHOL/HDL Ratio: 3.1 Ratio (ref <=5.0)
Triglycerides: 83 mg/dL (ref <150)
VLDL: 17 mg/dL (ref <30)

## 2014-11-18 LAB — HEPATIC FUNCTION PANEL
ALK PHOS: 48 U/L (ref 40–115)
ALT: 17 U/L (ref 9–46)
AST: 24 U/L (ref 10–35)
Albumin: 4.8 g/dL (ref 3.6–5.1)
Bilirubin, Direct: 0.2 mg/dL (ref <=0.2)
Indirect Bilirubin: 0.5 mg/dL (ref 0.2–1.2)
TOTAL PROTEIN: 7.2 g/dL (ref 6.1–8.1)
Total Bilirubin: 0.7 mg/dL (ref 0.2–1.2)

## 2014-11-18 LAB — BASIC METABOLIC PANEL WITH GFR
BUN: 21 mg/dL (ref 7–25)
CALCIUM: 9.7 mg/dL (ref 8.6–10.3)
CO2: 26 mmol/L (ref 20–31)
CREATININE: 1.51 mg/dL — AB (ref 0.70–1.25)
Chloride: 102 mmol/L (ref 98–110)
GFR, EST AFRICAN AMERICAN: 54 mL/min — AB (ref >=60)
GFR, Est Non African American: 47 mL/min — ABNORMAL LOW (ref >=60)
GLUCOSE: 103 mg/dL — AB (ref 65–99)
Potassium: 4.5 mmol/L (ref 3.5–5.3)
Sodium: 140 mmol/L (ref 135–146)

## 2014-11-18 LAB — HEMOGLOBIN A1C
Hgb A1c MFr Bld: 6.5 % — ABNORMAL HIGH (ref <5.7)
Mean Plasma Glucose: 140 mg/dL — ABNORMAL HIGH (ref <117)

## 2014-11-18 LAB — TSH: TSH: 1.18 u[IU]/mL (ref 0.350–4.500)

## 2014-11-18 LAB — MAGNESIUM: Magnesium: 2 mg/dL (ref 1.5–2.5)

## 2014-11-18 MED ORDER — EZETIMIBE 10 MG PO TABS: ORAL_TABLET | ORAL | Status: DC

## 2014-11-18 NOTE — Progress Notes (Signed)
Patient ID: [name], male   DOB: 1946-03-21, 69 y.o.   MRN: [id number]  Assessment and Plan:  Hypertension:  -Continue medication -monitor blood pressure at home. -Continue DASH diet -Reminder to go to the ER if any CP, SOB, nausea, dizziness, severe HA, changes vision/speech, left arm numbness and tingling and jaw pain.  Cholesterol - Continue diet and exercise -Check cholesterol.   Diabetes with diabetic chronic kidney disease -Continue diet and exercise.  -Check A1C  Vitamin D Def -check level -continue medications.   Continue diet and meds as discussed. Further disposition pending results of labs. Discussed med's effects and SE's.    HPI 69 y.o. male  presents for 3 month follow up with hypertension, hyperlipidemia, diabetes and vitamin D deficiency.   His blood pressure has been controlled at home, today their BP is BP: 118/80 mmHg.He does not workout. He denies chest pain, shortness of breath, dizziness.  He states that it is too hot to walk currently.  He is going to start back walking.     He is on cholesterol medication and denies myalgias. His cholesterol is at goal. The cholesterol was:  08/01/2014: Cholesterol 186; HDL 48; LDL Cholesterol 121*; Triglycerides 87   He has been working on diet and exercise for diabetes with diabetic chronic kidney disease, he is on bASA, he is on ACE/ARB, and denies  foot ulcerations, hyperglycemia, hypoglycemia , increased appetite, nausea, paresthesia of the feet, polydipsia, polyuria, visual disturbances, vomiting and weight loss. Last A1C was: 08/01/2014: Hgb A1c MFr Bld 6.5*   Patient is on Vitamin D supplement. 08/01/2014: Vit D, 25-Hydroxy 38    Current Medications:  Current Outpatient Prescriptions on File Prior to Visit  Medication Sig Dispense Refill  . acetaminophen (TYLENOL) 500 MG tablet Take 500 mg by mouth every 6 (six) hours as needed for pain.    . benazepril (LOTENSIN) 10 MG tablet Take 10 mg by mouth daily.    .  Cholecalciferol (VITAMIN D PO) Take by mouth daily. TAKING 5,000 UNITS 4 TIMES A WEEK AN 2,000 UNITS 3 TIMES A WEEK    . clotrimazole-betamethasone (LOTRISONE) cream Apply 1 application topically 2 (two) times daily. PRN    . ezetimibe (ZETIA) 10 MG tablet Take 1 tablet daily for Cholesterol 90 tablet 99  . fenofibrate micronized (LOFIBRA) 134 MG capsule TAKE 1 CAPSULE BY MOUTH EVERY EVENING. 90 capsule 0  . fexofenadine (ALLEGRA) 30 MG tablet Take 30 mg by mouth as needed.     . Loratadine 10 MG CAPS Take by mouth.    . magnesium gluconate (MAGONATE) 500 MG tablet Take 500 mg by mouth daily. Has 250 mg tablet is taking 2 tablets    . mometasone (NASONEX) 50 MCG/ACT nasal spray Place 2 sprays into the nose daily.    Marland Kitchen warfarin (COUMADIN) 5 MG tablet Take as directed by coumadin clinic 160 tablet 0   No current facility-administered medications on file prior to visit.   Medical History:  Past Medical History  Diagnosis Date  . Pulmonary embolism     march 2010, 2012              /   DVT x  2  LEFT 2010  . Coronary artery disease     non-obstructive by cath 2009  . Pre-diabetes   . Peripheral vascular disease   . Severe mitral regurgitation by prior echocardiogram   . Sleep apnea     STOP BANG SCORE 4  . S/P mitral  valve repair 08/15/2012    Complex valvuloplasty including triangular resection of posterior leaflet, artificial Gore-tex neocord placement x4 and Sorin Memo 3D ring annuloplasty via right mini thoracotomy approach  . Hyperlipidemia   . Hypertension     Clearance with note Dr Marvel Plan on chart,  Lovonox bridging  ordered by Dr Sanjuana Kava    EKG 9/12, chst 9/12 EPIC  . Heart murmur   . Seasonal allergies   . Osteoarthritis   . Vitamin D deficiency   . Degenerative joint disease   . Allergy     SEASONAL  . Cataract     SMALL IN LEFT EYE   Allergies:  Allergies  Allergen Reactions  . Statins     REACTION: muscle aches  . Welchol [Colesevelam Hcl]      Review of  Systems:  Review of Systems  Constitutional: Negative for fever and chills.  HENT: Negative for congestion, ear pain and sore throat.   Eyes: Negative.   Respiratory: Negative for cough, shortness of breath and wheezing.   Cardiovascular: Negative for chest pain, palpitations and leg swelling.  Gastrointestinal: Negative for heartburn, diarrhea, constipation, blood in stool and melena.  Genitourinary: Negative.   Skin: Negative.   Neurological: Negative for dizziness, loss of consciousness and headaches.  Psychiatric/Behavioral: Negative for depression. The patient is not nervous/anxious and does not have insomnia.     Family history- Review and unchanged  Social history- Review and unchanged  Physical Exam: BP 118/80 mmHg  Pulse 68  Temp(Src) 98.4 F (36.9 C) (Temporal)  Resp 18  Ht 6' (1.829 m)  Wt 221 lb (100.245 kg)  BMI 29.97 kg/m2 Wt Readings from Last 3 Encounters:  11/18/14 221 lb (100.245 kg)  10/09/14 219 lb (99.338 kg)  08/01/14 223 lb (101.152 kg)   General Appearance: Well nourished well developed, non-toxic appearing, in no apparent distress. Eyes: PERRLA, EOMs, conjunctiva no swelling or erythema ENT/Mouth: Ear canals clear with no erythema, swelling, or discharge.  TMs normal bilaterally, oropharynx clear, moist, with no exudate.   Neck: Supple, thyroid normal, no JVD, no cervical adenopathy.  Respiratory: Respiratory effort normal, breath sounds clear A&P, no wheeze, rhonchi or rales noted.  No retractions, no accessory muscle usage Cardio: RRR with no MRGs. No noted edema.  Abdomen: Soft, + BS.  Non tender, no guarding, rebound, hernias, masses. Musculoskeletal: Full ROM, 5/5 strength, Normal gait Skin: Warm, dry without rashes, lesions, ecchymosis.  Neuro: Awake and oriented X 3, Cranial nerves intact. No cerebellar symptoms.  Psych: normal affect, Insight and Judgment appropriate.    Terri Piedra, PA-C 8:58 AM [facility]

## 2014-11-19 LAB — INSULIN, RANDOM: Insulin: 10.3 u[IU]/mL (ref 2.0–19.6)

## 2014-11-19 LAB — VITAMIN D 25 HYDROXY (VIT D DEFICIENCY, FRACTURES): Vit D, 25-Hydroxy: 47 ng/mL (ref 30–100)

## 2014-11-27 ENCOUNTER — Ambulatory Visit (INDEPENDENT_AMBULATORY_CARE_PROVIDER_SITE_OTHER): Payer: 59 | Admitting: *Deleted

## 2014-11-27 DIAGNOSIS — I340 Nonrheumatic mitral (valve) insufficiency: Secondary | ICD-10-CM

## 2014-11-27 DIAGNOSIS — Z9889 Other specified postprocedural states: Secondary | ICD-10-CM | POA: Diagnosis not present

## 2014-11-27 DIAGNOSIS — I2699 Other pulmonary embolism without acute cor pulmonale: Secondary | ICD-10-CM | POA: Diagnosis not present

## 2014-11-27 DIAGNOSIS — Z7901 Long term (current) use of anticoagulants: Secondary | ICD-10-CM

## 2014-11-27 LAB — POCT INR: INR: 2.3

## 2014-12-09 ENCOUNTER — Other Ambulatory Visit: Payer: Self-pay | Admitting: Physician Assistant

## 2015-01-08 ENCOUNTER — Ambulatory Visit (INDEPENDENT_AMBULATORY_CARE_PROVIDER_SITE_OTHER): Payer: 59 | Admitting: *Deleted

## 2015-01-08 DIAGNOSIS — Z7901 Long term (current) use of anticoagulants: Secondary | ICD-10-CM

## 2015-01-08 DIAGNOSIS — I2699 Other pulmonary embolism without acute cor pulmonale: Secondary | ICD-10-CM | POA: Diagnosis not present

## 2015-01-08 DIAGNOSIS — I340 Nonrheumatic mitral (valve) insufficiency: Secondary | ICD-10-CM | POA: Diagnosis not present

## 2015-01-08 DIAGNOSIS — Z9889 Other specified postprocedural states: Secondary | ICD-10-CM | POA: Diagnosis not present

## 2015-01-08 LAB — POCT INR: INR: 3.9

## 2015-01-14 ENCOUNTER — Ambulatory Visit (INDEPENDENT_AMBULATORY_CARE_PROVIDER_SITE_OTHER): Payer: 59 | Admitting: *Deleted

## 2015-01-14 DIAGNOSIS — Z23 Encounter for immunization: Secondary | ICD-10-CM | POA: Diagnosis not present

## 2015-02-05 ENCOUNTER — Ambulatory Visit (INDEPENDENT_AMBULATORY_CARE_PROVIDER_SITE_OTHER): Payer: 59 | Admitting: *Deleted

## 2015-02-05 DIAGNOSIS — I2699 Other pulmonary embolism without acute cor pulmonale: Secondary | ICD-10-CM | POA: Diagnosis not present

## 2015-02-05 DIAGNOSIS — I340 Nonrheumatic mitral (valve) insufficiency: Secondary | ICD-10-CM

## 2015-02-05 DIAGNOSIS — Z7901 Long term (current) use of anticoagulants: Secondary | ICD-10-CM

## 2015-02-05 DIAGNOSIS — Z9889 Other specified postprocedural states: Secondary | ICD-10-CM

## 2015-02-05 LAB — POCT INR: INR: 2.2

## 2015-02-18 ENCOUNTER — Encounter: Payer: Self-pay | Admitting: Internal Medicine

## 2015-02-18 ENCOUNTER — Ambulatory Visit (INDEPENDENT_AMBULATORY_CARE_PROVIDER_SITE_OTHER): Payer: 59 | Admitting: Internal Medicine

## 2015-02-18 VITALS — BP 122/80 | HR 64 | Temp 97.9°F | Resp 16 | Ht 72.0 in | Wt 223.4 lb

## 2015-02-18 DIAGNOSIS — Z789 Other specified health status: Secondary | ICD-10-CM | POA: Diagnosis not present

## 2015-02-18 DIAGNOSIS — Z79899 Other long term (current) drug therapy: Secondary | ICD-10-CM

## 2015-02-18 DIAGNOSIS — Z9181 History of falling: Secondary | ICD-10-CM

## 2015-02-18 DIAGNOSIS — E1122 Type 2 diabetes mellitus with diabetic chronic kidney disease: Secondary | ICD-10-CM | POA: Diagnosis not present

## 2015-02-18 DIAGNOSIS — Z1389 Encounter for screening for other disorder: Secondary | ICD-10-CM | POA: Diagnosis not present

## 2015-02-18 DIAGNOSIS — Z0001 Encounter for general adult medical examination with abnormal findings: Secondary | ICD-10-CM | POA: Diagnosis not present

## 2015-02-18 DIAGNOSIS — R6889 Other general symptoms and signs: Secondary | ICD-10-CM

## 2015-02-18 DIAGNOSIS — Z1331 Encounter for screening for depression: Secondary | ICD-10-CM

## 2015-02-18 DIAGNOSIS — Z9889 Other specified postprocedural states: Secondary | ICD-10-CM | POA: Diagnosis not present

## 2015-02-18 DIAGNOSIS — E669 Obesity, unspecified: Secondary | ICD-10-CM | POA: Diagnosis not present

## 2015-02-18 DIAGNOSIS — I10 Essential (primary) hypertension: Secondary | ICD-10-CM | POA: Diagnosis not present

## 2015-02-18 DIAGNOSIS — E785 Hyperlipidemia, unspecified: Secondary | ICD-10-CM

## 2015-02-18 DIAGNOSIS — N183 Chronic kidney disease, stage 3 unspecified: Secondary | ICD-10-CM

## 2015-02-18 DIAGNOSIS — Z6830 Body mass index (BMI) 30.0-30.9, adult: Secondary | ICD-10-CM

## 2015-02-18 DIAGNOSIS — E559 Vitamin D deficiency, unspecified: Secondary | ICD-10-CM

## 2015-02-18 LAB — CBC WITH DIFFERENTIAL/PLATELET
Basophils Absolute: 0 10*3/uL (ref 0.0–0.1)
Basophils Relative: 1 % (ref 0–1)
EOS PCT: 2 % (ref 0–5)
Eosinophils Absolute: 0.1 10*3/uL (ref 0.0–0.7)
HEMATOCRIT: 45.6 % (ref 39.0–52.0)
Hemoglobin: 15.5 g/dL (ref 13.0–17.0)
LYMPHS ABS: 1.8 10*3/uL (ref 0.7–4.0)
LYMPHS PCT: 44 % (ref 12–46)
MCH: 30.8 pg (ref 26.0–34.0)
MCHC: 34 g/dL (ref 30.0–36.0)
MCV: 90.7 fL (ref 78.0–100.0)
MONO ABS: 0.2 10*3/uL (ref 0.1–1.0)
MONOS PCT: 6 % (ref 3–12)
MPV: 11.5 fL (ref 8.6–12.4)
Neutro Abs: 1.9 10*3/uL (ref 1.7–7.7)
Neutrophils Relative %: 47 % (ref 43–77)
Platelets: 256 10*3/uL (ref 150–400)
RBC: 5.03 MIL/uL (ref 4.22–5.81)
RDW: 13.8 % (ref 11.5–15.5)
WBC: 4 10*3/uL (ref 4.0–10.5)

## 2015-02-18 LAB — HEMOGLOBIN A1C
HEMOGLOBIN A1C: 6.4 % — AB (ref <5.7)
MEAN PLASMA GLUCOSE: 137 mg/dL — AB (ref <117)

## 2015-02-18 NOTE — Patient Instructions (Signed)

## 2015-02-18 NOTE — Progress Notes (Addendum)
Patient ID: [name], male   DOB: October 21, 1945, 69 y.o.   MRN: [id number]  Medicare Annual Wellness Visit and  Comprehensive Evaluation & Examination   Assessment:   1. Essential hypertension  - TSH  2. Hyperlipidemia  - Lipid panel - TSH  3. Type 2 diabetes mellitus with stage 3 chronic kidney disease, without long-term current use of insulin (HCC)  - Hemoglobin A1c - Insulin, random  4. Vitamin D deficiency  - VITAMIN D 25 Hydroxy   5. S/P mitral valve repair   6. Obesity   7. BMI 30.0-30.9,adult   8. Depression screen   9. At low risk for fall   10. Medication management  - CBC with Differential/Platelet - BASIC METABOLIC PANEL WITH GFR - Hepatic function panel - Magnesium  11. Encounter for general adult medical examination with abnormal findings  Plan:   During the course of the visit the patient was educated and counseled about appropriate screening and preventive services including:    Pneumococcal vaccine   Influenza vaccine  Td vaccine  Screening electrocardiogram  Bone densitometry screening  Colorectal cancer screening  Diabetes screening  Glaucoma screening  Nutrition counseling   Advanced directives: requested  Screening recommendations, referrals: Vaccinations: Immunization History  Administered Date(s) Administered  . Influenza, High Dose Seasonal PF 12/20/2013, 01/14/2015  . Influenza-Unspecified 01/20/2013  . Pneumococcal-Unspecified 05/11/2012  . Td 11/22/2003  Prevnar vaccine out of stock Shingles vaccine undecided Hep B vaccine not indicated  Nutrition assessed and recommended  Colonoscopy 07/12/2014 Recommended yearly ophthalmology/optometry visit for glaucoma screening and checkup Recommended yearly dental visit for hygiene and checkup Advanced directives - Yes  Conditions/risks identified: BMI: Discussed weight loss, diet, and increase physical activity.  Increase physical activity: AHA recommends 150  minutes of physical activity a week.  Medications reviewed Diabetes is not at goal, ACE/ARB therapy: Yes. Urinary Incontinence is not an issue: discussed non pharmacology and pharmacology options.  Fall risk: low- discussed PT, home fall assessment, medications.   Subjective:    [name]  presents for [facility] Annual Wellness Visit and presents for a comprehensive evaluation, examination and management of multiple medical co-morbidities.  Date of last medicare wellness visit was 08/24/2013.This very nice 69 y.o. MBM presents als for 6 month follow up with Hypertension, Hyperlipidemia, Pre-Diabetes and Vitamin D Deficiency.    Patient is treated for HTN since 2005 & BP has been controlled at home. Today's BP: 122/80 mmHg.  In 2009 he had a cath showing mild CAD and severe Mitral Regurg. and in 2014 he had Mitral valuvoplasty and is followed by Dr's Juleen Starr. Patient has had no complaints of any cardiac type chest pain, palpitations, dyspnea/orthopnea/PND, dizziness, claudication, or dependent edema. He had PE after an orthopedic surg in 2010 and was treated w/coumadin and then d/c'd. And then had a 2sd PE in 2012 and has been on Coumadin since.    Hyperlipidemia is controlled with diet & meds. Patient denies myalgias or other med SE's. Last Lipids were at goal with Cholesterol 151; HDL 48; LDL 86; Triglycerides 83 on 11/18/2014.        Also, the patient has history of diet controlled T2_DM since 2002 and he has CKD 3 with GFR 54 ml/min and has had no symptoms of reactive hypoglycemia, diabetic polys, paresthesias or visual blurring.  Last A1c was not at goal with 6.5% on 11/18/2014.     Further, the patient also has history of Vitamin D Deficiency of 30 in May 2013  and supplements vitamin D without any suspected side-effects. Last vitamin D was 47 on 11/18/2014.  Names of Other Physician/Practitioners you currently use: 1. [facility] here for  primary care 2. Dr Fredrich Birks, OD, eye doctor, last visit 2015 & due now  3. Dr Ladona Ridgel, DDS, dentist, last visit - was last week  Patient Care Team: Lucky Cowboy, MD as PCP - General (Internal Medicine) Hilarie Fredrickson, MD as Consulting Physician (Gastroenterology) Kathleene Hazel, MD as Consulting Physician (Cardiology)  Medication Review: Medication Sig  . acetaminophen  500 MG tablet Take 500 mg by mouth every 6 (six) hours as needed for pain.  Marland Kitchen VITAMIN D  Take by mouth daily. TAKING 5,000 UNITS 4 TIMES A WEEK AN 2,000 UNITS 3 TIMES A WEEK  . LOTRISONE cream Apply 1 application topically 2 (two) times daily. PRN  . ezetimibe 10 MG tablet Take 1 tablet daily for Cholesterol  . fenofibrate 134 MG capsule TAKE 1 CAPSULE BY MOUTH EVERY EVENING.  . fexofenadine  30 MG tablet Take 30 mg by mouth as needed.   . Loratadine 10 MG CAPS Take by mouth.  Marland Kitchen NASONEX  nasal spray Place 2 sprays into the nose daily.  Marland Kitchen warfarin  5 MG tablet Take as directed by coumadin clinic  . magnesium gluconate  500 MG tablet Take 500 mg by mouth daily. Has 250 mg tablet is taking 2 tablets   Allergies  Allergen Reactions  . Statins     REACTION: muscle aches  . Welchol [Colesevelam Hcl]    Current Problems (verified) Patient Active Problem List   Diagnosis Date Noted  . BMI 30.0-30.9,adult 02/18/2015  . Hx of colonic polyps 05/31/2014  . Obesity 04/24/2014  . Medication management 05/24/2013  . DM type 2 causing CKD stage 3 (HCC) 05/24/2013  . Hyperlipidemia   . Hypertension   . Seasonal allergies   . Osteoarthritis   . Vitamin D deficiency   . Degenerative joint disease   . S/P mitral valve repair 08/15/2012  . S/P right THA, AA 08/24/2011  . Right carotid bruit 06/14/2011  . Long term (current) use of anticoagulants 11/05/2010    Screening Tests Health Maintenance  Topic Date Due  . Hepatitis C Screening  [birth year]  . FOOT EXAM  08/21/1955  . OPHTHALMOLOGY EXAM  [birth year]21/1955  .  ZOSTAVAX  08/22/2006  . PNA vac Low Risk Adult (2 of 2 - PCV13) 05/11/2013  . TETANUS/TDAP  11/21/2013  . HEMOGLOBIN A1C  05/20/2015  . URINE MICROALBUMIN  08/01/2015  . INFLUENZA VACCINE  10/21/2015  . COLONOSCOPY  07/11/2024    Immunization History  Administered Date(s) Administered  . Influenza, High Dose Seasonal PF 12/20/2013, 01/14/2015  . Influenza-Unspecified 01/20/2013  . Pneumococcal-Unspecified 05/11/2012  . Td 11/22/2003    Preventative care: Last colonoscopy: 07/12/2014  Past Medical History  Diagnosis Date  . Pulmonary embolism [facility])     march 2010, 2012              /   DVT x  2  LEFT 2010  . Coronary artery disease     non-obstructive by cath 2009  . Pre-diabetes   . Peripheral vascular disease (HCC)   . Severe mitral regurgitation by prior echocardiogram   . Sleep apnea     STOP BANG SCORE 4  . S/P mitral valve repair 08/15/2012    Complex valvuloplasty including triangular resection of posterior leaflet, artificial Gore-tex neocord placement x4 and Sorin  Memo 3D ring annuloplasty via right mini thoracotomy approach  . Hyperlipidemia   . Hypertension     Clearance with note Dr Marvel Plan on chart,  Lovonox bridging  ordered by Dr Sanjuana Kava    EKG 9/12, chst 9/12 EPIC  . Heart murmur   . Seasonal allergies   . Osteoarthritis   . Vitamin D deficiency   . Degenerative joint disease   . Allergy     SEASONAL  . Cataract     SMALL IN LEFT EYE    Past Surgical History  Procedure Laterality Date  . Achillis tendon      repair 20 years ago  . Hip arthroplasty      11/06/07  . Total hip arthroplasty  08/24/2011    Procedure: TOTAL HIP ARTHROPLASTY ANTERIOR APPROACH;  Surgeon: Shelda Pal, MD;  Location: [facility] ORS;  Service: Orthopedics;  Laterality: Right;  . Tee without cardioversion N/A 07/04/2012    Procedure: TRANSESOPHAGEAL ECHOCARDIOGRAM (TEE);  Surgeon: Laurey Morale, MD;  Location: [facility] ENDOSCOPY;  Service: Cardiovascular;  Laterality: N/A;  . Cardiac  catheterization    . Mitral valve repair Right 08/15/2012    Procedure: MINIMALLY INVASIVE MITRAL VALVE REPAIR (MVR);  Surgeon: Purcell Nails, MD;  Location: [facility] OR;  Service: Open Heart Surgery;  Laterality: Right;  . Intraoperative transesophageal echocardiogram N/A 08/15/2012    Procedure: INTRAOPERATIVE TRANSESOPHAGEAL ECHOCARDIOGRAM;  Surgeon: Purcell Nails, MD;  Location: [facility] OR;  Service: Open Heart Surgery;  Laterality: N/A;  . Colonoscopy      Risk Factors: Tobacco Social History  Substance Use Topics  . Smoking status: Former Smoker    Types: Cigars    Quit date: 03/22/1973  . Smokeless tobacco: Former Neurosurgeon    Types: Chew    Quit date: 08/15/2012     Comment: uses smokeless tobacco  . Alcohol Use: No   He does not smoke.  Patient is a former smoker. Are there smokers in your home (other than you)?  No  Alcohol Current alcohol use: none  Caffeine Current caffeine use: coffee 1 cup /day  Exercise Current exercise: walking and yard work  Nutrition/Diet Current diet: in general, a "healthy" diet    Cardiac risk factors: advanced age (older than 89 for men, 38 for women), diabetes mellitus, dyslipidemia, hypertension, male gender, obesity (BMI >= 30 kg/m2), sedentary lifestyle and smoking/ tobacco exposure.  Depression Screen (Note: if answer to either of the following is "Yes", a more complete depression screening is indicated)   Q1: Over the past two weeks, have you felt down, depressed or hopeless? No  Q2: Over the past two weeks, have you felt little interest or pleasure in doing things? No  Have you lost interest or pleasure in daily life? No  Do you often feel hopeless? No  Do you cry easily over simple problems? No  Activities of Daily Living In your present state of health, do you have any difficulty performing the following activities?:  Driving? No Managing money?  No Feeding yourself? No Getting from bed to chair? No Climbing a flight of stairs?  No Preparing food and eating?: No Bathing or showering? No Getting dressed: No Getting to the toilet? No Using the toilet:No Moving around from place to place: No In the past year have you fallen or had a near fall?:Yes, stumbled and had no significant injury.   Are you sexually active?  No  Do you have more than one partner?  No  Vision Difficulties: No  Hearing Difficulties: No Do you often ask people to speak up or repeat themselves? No Do you experience ringing or noises in your ears? No Do you have difficulty understanding soft or whispered voices? No  Cognition  Do you feel that you have a problem with memory?No  Do you often misplace items? No  Do you feel safe at home?  Yes  Advanced directives Does patient have a Health Care Power of Attorney? Yes Does patient have a Living Will? Yes  ROS: Constitutional: Denies fever, chills, weight loss/gain, headaches, insomnia, fatigue, night sweats or change in appetite. Eyes: Denies redness, blurred vision, diplopia, discharge, itchy or watery eyes.  ENT: Denies discharge, congestion, post nasal drip, epistaxis, sore throat, earache, hearing loss, dental pain, Tinnitus, Vertigo, Sinus pain or snoring.  Cardio: Denies chest pain, palpitations, irregular heartbeat, syncope, dyspnea, diaphoresis, orthopnea, PND, claudication or edema Respiratory: denies cough, dyspnea, DOE, pleurisy, hoarseness, laryngitis or wheezing.  Gastrointestinal: Denies dysphagia, heartburn, reflux, water brash, pain, cramps, nausea, vomiting, bloating, diarrhea, constipation, hematemesis, melena, hematochezia, jaundice or hemorrhoids Genitourinary: Denies dysuria, frequency, urgency, nocturia, hesitancy, discharge, hematuria or flank pain Musculoskeletal: Denies arthralgia, myalgia, stiffness, Jt. Swelling, pain, limp or strain/sprain. Denies Falls. Skin: Denies puritis, rash, hives, warts, acne, eczema or change in skin lesion Neuro: No weakness, tremor,  incoordination, spasms, paresthesia or pain Psychiatric: Denies confusion, memory loss or sensory loss. Denies Depression. Endocrine: Denies change in weight, skin, hair change, nocturia, and paresthesia, diabetic polys, visual blurring or hyper / hypo glycemic episodes.  Heme/Lymph: No excessive bleeding, bruising or enlarged lymph nodes.  Objective:     BP 122/80 mmHg  Pulse 64  Temp(Src) 97.9 F (36.6 C)  Resp 16  Ht 6' (1.829 m)  Wt 223 lb 6.4 oz (101.334 kg)  BMI 30.29 kg/m2  General Appearance:  Alert  WD/WN, male  in no apparent distress. Eyes: PERRLA, EOMs nl, conjunctiva normal, normal fundi and vessels. Sinuses: No frontal/maxillary tenderness ENT/Mouth: EACs patent / TMs  nl. Nares clear without erythema, swelling, mucoid exudates. Oral hygiene is good. No erythema, swelling, or exudate. Tongue normal, non-obstructing. Tonsils not swollen or erythematous. Hearing normal.  Neck: Supple, thyroid normal. No bruits, nodes or JVD. Respiratory: Respiratory effort normal.  BS equal and clear bilateral without rales, rhonci, wheezing or stridor. Cardio: Heart sounds are normal with regular rate and rhythm and no murmurs, rubs or gallops. Peripheral pulses are normal and equal bilaterally without edema. No aortic or femoral bruits. Chest: symmetric with normal excursions and percussion.  Abdomen: Flat, soft with nl bowel sounds. Nontender, no guarding, rebound, hernias, masses, or organomegaly.  Lymphatics: Non tender without lymphadenopathy.  Musculoskeletal: Full ROM all peripheral extremities, joint stability, 5/5 strength, and normal gait. Skin: Warm and dry without rashes, lesions, cyanosis, clubbing or  ecchymosis.  Neuro: Cranial nerves intact, reflexes equal bilaterally. Normal muscle tone, no cerebellar symptoms. Sensation intact to touch, vibration and monofilament to the toes bilaterally.  Pysch: Alert and oriented X 3 with normal affect, insight and judgment appropriate.    Cognitive Testing  Alert? Yes  Normal Appearance? Yes  Oriented to person? Yes  Place? Yes   Time? Yes  Recall of three objects?  Yes  Can perform simple calculations? Yes  Displays appropriate judgment? Yes  Can read the correct time from a watch/clock? Yes  Medicare Attestation I have personally reviewed: The patient's medical and social history Their use of alcohol, tobacco or illicit drugs Their current medications and supplements The patient's functional ability  including ADLs,fall risks, home safety risks, cognitive, and hearing and visual impairment Diet and physical activities Evidence for depression or mood disorders  The patient's weight, height, BMI, and visual acuity have been recorded in the chart.  I have made referrals, counseling, and provided education to the patient based on review of the above and I have provided the patient with a written personalized care plan for preventive services.  Over 40 minutes of exam, counseling, chart review was performed.  Witt Plitt DAVID, MD   02/18/2015

## 2015-02-19 LAB — LIPID PANEL
Cholesterol: 167 mg/dL (ref 125–200)
HDL: 50 mg/dL (ref >=40)
LDL CALC: 102 mg/dL (ref <130)
Total CHOL/HDL Ratio: 3.3 Ratio (ref <=5.0)
Triglycerides: 73 mg/dL (ref <150)
VLDL: 15 mg/dL (ref <30)

## 2015-02-19 LAB — TSH: TSH: 1.237 u[IU]/mL (ref 0.350–4.500)

## 2015-02-19 LAB — HEPATIC FUNCTION PANEL
ALBUMIN: 4.4 g/dL (ref 3.6–5.1)
ALK PHOS: 46 U/L (ref 40–115)
ALT: 20 U/L (ref 9–46)
AST: 27 U/L (ref 10–35)
Bilirubin, Direct: 0.1 mg/dL (ref <=0.2)
Indirect Bilirubin: 0.5 mg/dL (ref 0.2–1.2)
TOTAL PROTEIN: 7.3 g/dL (ref 6.1–8.1)
Total Bilirubin: 0.6 mg/dL (ref 0.2–1.2)

## 2015-02-19 LAB — BASIC METABOLIC PANEL WITH GFR
BUN: 21 mg/dL (ref 7–25)
CO2: 26 mmol/L (ref 20–31)
CREATININE: 1.49 mg/dL — AB (ref 0.70–1.25)
Calcium: 9.8 mg/dL (ref 8.6–10.3)
Chloride: 103 mmol/L (ref 98–110)
GFR, EST AFRICAN AMERICAN: 55 mL/min — AB (ref >=60)
GFR, Est Non African American: 48 mL/min — ABNORMAL LOW (ref >=60)
GLUCOSE: 108 mg/dL — AB (ref 65–99)
POTASSIUM: 4.3 mmol/L (ref 3.5–5.3)
Sodium: 137 mmol/L (ref 135–146)

## 2015-02-19 LAB — MAGNESIUM: Magnesium: 1.9 mg/dL (ref 1.5–2.5)

## 2015-02-19 LAB — VITAMIN D 25 HYDROXY (VIT D DEFICIENCY, FRACTURES): Vit D, 25-Hydroxy: 46 ng/mL (ref 30–100)

## 2015-02-19 LAB — INSULIN, RANDOM: INSULIN: 12 u[IU]/mL (ref 2.0–19.6)

## 2015-02-24 ENCOUNTER — Other Ambulatory Visit: Payer: Self-pay | Admitting: Cardiovascular Disease

## 2015-03-01 ENCOUNTER — Other Ambulatory Visit: Payer: Self-pay | Admitting: *Deleted

## 2015-03-01 DIAGNOSIS — E785 Hyperlipidemia, unspecified: Secondary | ICD-10-CM

## 2015-03-01 DIAGNOSIS — I10 Essential (primary) hypertension: Secondary | ICD-10-CM

## 2015-03-05 ENCOUNTER — Ambulatory Visit (INDEPENDENT_AMBULATORY_CARE_PROVIDER_SITE_OTHER): Payer: 59 | Admitting: *Deleted

## 2015-03-05 DIAGNOSIS — Z7901 Long term (current) use of anticoagulants: Secondary | ICD-10-CM | POA: Diagnosis not present

## 2015-03-05 DIAGNOSIS — I2699 Other pulmonary embolism without acute cor pulmonale: Secondary | ICD-10-CM

## 2015-03-05 DIAGNOSIS — Z9889 Other specified postprocedural states: Secondary | ICD-10-CM

## 2015-03-05 LAB — POCT INR: INR: 2.5

## 2015-03-24 MED FILL — EZETIMIBE 10 MG TABLET: 10 | 30 days supply | Qty: 30 | Fill #3

## 2015-03-31 ENCOUNTER — Encounter (HOSPITAL_COMMUNITY): Payer: Self-pay | Admitting: *Deleted

## 2015-03-31 ENCOUNTER — Emergency Department (INDEPENDENT_AMBULATORY_CARE_PROVIDER_SITE_OTHER)
Admission: EM | Admit: 2015-03-31 | Discharge: 2015-03-31 | Disposition: A | Discharge status: Home / Self Care | Payer: 59 | Source: Home / Self Care | Attending: Family Medicine | Admitting: Family Medicine

## 2015-03-31 DIAGNOSIS — J069 Acute upper respiratory infection, unspecified: Secondary | ICD-10-CM

## 2015-03-31 MED ORDER — IPRATROPIUM BROMIDE 0.06 % NA SOLN: 2.0000 | Freq: Four times a day (QID) | NASAL | Status: DC

## 2015-03-31 MED ORDER — GUAIFENESIN-CODEINE 100-10 MG/5ML PO SYRP: 10.0000 mL | ORAL_SOLUTION | Freq: Four times a day (QID) | ORAL | Status: DC | PRN

## 2015-03-31 MED FILL — CHERATUSSIN AC SYRUP: 100-10 | 5 days supply | Qty: 180 | Fill #0

## 2015-03-31 MED FILL — IPRATROPIUM 0.06% SPRAY: 0.06 | 12 days supply | Qty: 15 | Fill #0

## 2015-03-31 NOTE — ED Notes (Signed)
Pt  Reports       Body  Aches        Cough   / congested         With  Sinus  Drainage       X   1  Week    Symptoms  Not  releived  By otc  meds

## 2015-03-31 NOTE — ED Provider Notes (Signed)
CSN: [id number]     Arrival date & time 03/31/15  1312 History   First MD Initiated Contact with Patient 03/31/15 1420     Chief Complaint  Patient presents with  . Cough   (Consider location/radiation/quality/duration/timing/severity/associated sxs/prior Treatment) Patient is a 70 y.o. male presenting with URI. The history is provided by the patient.  URI Presenting symptoms: congestion, cough and rhinorrhea   Presenting symptoms: no fever   Severity:  Mild Onset quality:  Gradual Duration:  6 days Timing:  Constant Progression:  Unchanged Chronicity:  New Relieved by:  Nothing Associated symptoms: no wheezing   Risk factors: being elderly     Past Medical History  Diagnosis Date  . Pulmonary embolism [facility])     march 2010, 2012              /   DVT x  2  LEFT 2010  . Coronary artery disease     non-obstructive by cath 2009  . Pre-diabetes   . Peripheral vascular disease (HCC)   . Severe mitral regurgitation by prior echocardiogram   . Sleep apnea     STOP BANG SCORE 4  . S/P mitral valve repair 08/15/2012    Complex valvuloplasty including triangular resection of posterior leaflet, artificial Gore-tex neocord placement x4 and Sorin Memo 3D ring annuloplasty via right mini thoracotomy approach  . Hyperlipidemia   . Hypertension     Clearance with note Dr Marvel Plan on chart,  Lovonox bridging  ordered by Dr Sanjuana Kava    EKG 9/12, chst 9/12 EPIC  . Heart murmur   . Seasonal allergies   . Osteoarthritis   . Vitamin D deficiency   . Degenerative joint disease   . Allergy     SEASONAL  . Cataract     SMALL IN LEFT EYE   Past Surgical History  Procedure Laterality Date  . Achillis tendon      repair 20 years ago  . Hip arthroplasty      11/06/07  . Total hip arthroplasty  08/24/2011    Procedure: TOTAL HIP ARTHROPLASTY ANTERIOR APPROACH;  Surgeon: Shelda Pal, MD;  Location: [facility] ORS;  Service: Orthopedics;  Laterality: Right;  . Tee without cardioversion N/A 07/04/2012    Procedure: TRANSESOPHAGEAL ECHOCARDIOGRAM (TEE);  Surgeon: Laurey Morale, MD;  Location: [facility] ENDOSCOPY;  Service: Cardiovascular;  Laterality: N/A;  . Cardiac catheterization    . Mitral valve repair Right 08/15/2012    Procedure: MINIMALLY INVASIVE MITRAL VALVE REPAIR (MVR);  Surgeon: Purcell Nails, MD;  Location: [facility] OR;  Service: Open Heart Surgery;  Laterality: Right;  . Intraoperative transesophageal echocardiogram N/A 08/15/2012    Procedure: INTRAOPERATIVE TRANSESOPHAGEAL ECHOCARDIOGRAM;  Surgeon: Purcell Nails, MD;  Location: [facility] OR;  Service: Open Heart Surgery;  Laterality: N/A;  . Colonoscopy     Family History  Problem Relation Age of Onset  . Coronary artery disease    . Heart attack Father   . Heart attack Brother 60  . Hypertension Father   . Hypertension Sister   . Hypertension Brother   . Hyperlipidemia Sister   . Diabetes Sister   . Diabetes Brother    Social History  Substance Use Topics  . Smoking status: Former Smoker    Types: Cigars    Quit date: 03/22/1973  . Smokeless tobacco: Former Neurosurgeon    Types: Chew    Quit date: 08/15/2012     Comment: uses smokeless tobacco  . Alcohol Use: No  Review of Systems  Constitutional: Negative.  Negative for fever.  HENT: Positive for congestion, postnasal drip and rhinorrhea.   Respiratory: Positive for cough. Negative for shortness of breath and wheezing.   Cardiovascular: Negative.   All other systems reviewed and are negative.   Allergies  Statins and Welchol  Home Medications   Prior to Admission medications   Medication Sig Start Date End Date Taking? Authorizing Provider  acetaminophen (TYLENOL) 500 MG tablet Take 500 mg by mouth every 6 (six) hours as needed for pain.    Historical Provider, MD  amoxicillin (AMOXIL) 500 MG capsule Take 500 mg by mouth 3 (three) times daily. Takes 4 tabs 1 hour prior to dental work.    Historical Provider, MD  benazepril (LOTENSIN) 20 MG tablet Take 20 mg by mouth  daily.    Historical Provider, MD  Cholecalciferol (VITAMIN D PO) Take by mouth daily. TAKING 5,000 UNITS 4 TIMES A WEEK AN 2,000 UNITS 3 TIMES A WEEK    Historical Provider, MD  clotrimazole-betamethasone (LOTRISONE) cream Apply 1 application topically 2 (two) times daily. PRN 08/24/13   Quentin Mulling, PA-C  ezetimibe (ZETIA) 10 MG tablet Take 1 tablet daily for Cholesterol 11/18/14 11/18/15  Courtney Forcucci, PA-C  fenofibrate micronized (LOFIBRA) 134 MG capsule TAKE 1 CAPSULE BY MOUTH EVERY EVENING. 12/09/14   Lucky Cowboy, MD  fexofenadine (ALLEGRA) 30 MG tablet Take 30 mg by mouth as needed.     Historical Provider, MD  guaiFENesin-codeine (ROBITUSSIN AC) 100-10 MG/5ML syrup Take 10 mLs by mouth 4 (four) times daily as needed for cough. 03/31/15   Linna Hoff, MD  ipratropium (ATROVENT) 0.06 % nasal spray Place 2 sprays into both nostrils 4 (four) times daily. 03/31/15   Linna Hoff, MD  Loratadine 10 MG CAPS Take by mouth.    Historical Provider, MD  magnesium gluconate (MAGONATE) 500 MG tablet Take 500 mg by mouth daily. Has 250 mg tablet is taking 2 tablets    Historical Provider, MD  mometasone (NASONEX) 50 MCG/ACT nasal spray Place 2 sprays into the nose daily.    Historical Provider, MD  warfarin (COUMADIN) 5 MG tablet TAKE AS DIRECTED BY COUMADIN CLINIC 02/24/15   Kathleene Hazel, MD   Meds Ordered and Administered this Visit  Medications - No data to display  BP 137/77 mmHg  Pulse 70  Temp(Src) 98.7 F (37.1 C) (Oral)  Resp 16  SpO2 95% No data found.   Physical Exam  Constitutional: He is oriented to person, place, and time. He appears well-developed and well-nourished. No distress.  HENT:  Right Ear: External ear normal.  Left Ear: External ear normal.  Mouth/Throat: Oropharynx is clear and moist.  Neck: Normal range of motion. Neck supple.  Cardiovascular: Normal heart sounds.   Pulmonary/Chest: Effort normal and breath sounds normal.  Lymphadenopathy:    He  has no cervical adenopathy.  Neurological: He is alert and oriented to person, place, and time.  Skin: Skin is warm and dry.  Nursing note and vitals reviewed.   ED Course  Procedures (including critical care time)  Labs Review Labs Reviewed - No data to display  Imaging Review No results found.   Visual Acuity Review  Right Eye Distance:   Left Eye Distance:   Bilateral Distance:    Right Eye Near:   Left Eye Near:    Bilateral Near:         MDM   1. URI (upper respiratory infection)  Linna Hoff, MD 03/31/15 1447

## 2015-04-02 ENCOUNTER — Ambulatory Visit (INDEPENDENT_AMBULATORY_CARE_PROVIDER_SITE_OTHER): Payer: 59 | Admitting: *Deleted

## 2015-04-02 ENCOUNTER — Encounter: Payer: Self-pay | Admitting: Internal Medicine

## 2015-04-02 ENCOUNTER — Ambulatory Visit (INDEPENDENT_AMBULATORY_CARE_PROVIDER_SITE_OTHER): Payer: 59 | Admitting: Internal Medicine

## 2015-04-02 VITALS — BP 118/64 | HR 92 | Temp 98.0°F | Resp 16 | Ht 72.0 in | Wt 221.0 lb

## 2015-04-02 DIAGNOSIS — Z7901 Long term (current) use of anticoagulants: Secondary | ICD-10-CM

## 2015-04-02 DIAGNOSIS — I2699 Other pulmonary embolism without acute cor pulmonale: Secondary | ICD-10-CM

## 2015-04-02 DIAGNOSIS — Z9889 Other specified postprocedural states: Secondary | ICD-10-CM

## 2015-04-02 DIAGNOSIS — J069 Acute upper respiratory infection, unspecified: Secondary | ICD-10-CM | POA: Diagnosis not present

## 2015-04-02 LAB — POCT INR: INR: 2.7

## 2015-04-02 MED ORDER — PREDNISONE 20 MG PO TABS: ORAL_TABLET | ORAL | Status: DC

## 2015-04-02 MED ORDER — AZITHROMYCIN 250 MG PO TABS: ORAL_TABLET | ORAL | Status: DC

## 2015-04-02 MED FILL — AZITHROMYCIN 250 MG TABLET: 250 | 5 days supply | Qty: 6 | Fill #0

## 2015-04-02 MED FILL — predniSONE 20 MG TABS: 20 | 15 days supply | Qty: 27 | Fill #0

## 2015-04-02 NOTE — Patient Instructions (Addendum)
-  Please take the cough syrup that you got at the urgent care.   -Please take 1 allegra per day.  -Please continue using nasxonex daily right before going to bed.  2 sprays per each nostril  -Please use saline in your nose as often as you can to help rinse your sinuses out.  -please take prednisone as prescribed until it is gone.  -please elevate your head as you sleep to help with your night time breathing.    -if you take the zpak please make sure you cut your coumadin in half.

## 2015-04-02 NOTE — Progress Notes (Signed)
Patient ID: [name], male   DOB: May 21, 1945, 70 y.o.   MRN: [id number]  HPI  Patient presents to the office for evaluation of cough.  It has been going on for 2 weeks.  Patient reports night > day, wet, worse with lying down, cough with clear sputum production.  They also endorse change in voice, postnasal drip and nasal congestion, sinus pressure, chest pain, side pain, and back pain from coughing.  .  They have tried mucinex, nasacort.  They report that nothing has worked.  They denies other sick contacts.  He did go to UC on Monday.    Review of Systems  Constitutional: Positive for malaise/fatigue. Negative for fever and chills.  HENT: Positive for congestion. Negative for ear pain, nosebleeds and sore throat.   Respiratory: Positive for cough. Negative for sputum production, shortness of breath and wheezing.   Cardiovascular: Positive for chest pain (only with coughing). Negative for palpitations and leg swelling.  Gastrointestinal: Positive for abdominal pain (when coughing).  Neurological: Positive for headaches.    PE:  Filed Vitals:   04/02/15 1028  BP: 118/64  Pulse: 92  Temp: 98 F (36.7 C)  Resp: 16    General:  Alert and non-toxic, WDWN, NAD HEENT: NCAT, PERLA, EOM normal, no occular discharge or erythema.  Nasal mucosal edema with sinus tenderness to palpation.  Oropharynx clear with minimal oropharyngeal edema and erythema.  Mucous membranes moist and pink. Neck:  Cervical adenopathy Chest:  RRR no MRGs.  Lungs clear to auscultation A&P with no wheezes rhonchi or rales.   Abdomen: +BS x 4 quadrants, soft, non-tender, no guarding, rigidity, or rebound. Skin: warm and dry no rash Neuro: A&Ox4, CN II-XII grossly intact  Assessment and Plan:  -lungs clear -nasal saline -tussionex from UC -prednsione -nasal saline -allegra -zpak to take if no better in 3-5 days.

## 2015-04-03 ENCOUNTER — Telehealth: Payer: Self-pay | Admitting: Pharmacist

## 2015-04-03 NOTE — Telephone Encounter (Signed)
Pt called to report that he started a prednisone taper on 1/11. He cannot make it in to clinic on 1/13 for a check - the earliest he can come in is Monday 1/16. Since pt was therapeutic but trending towards the higher end of his range at last check yesterday, advised him to take 1 tablet daily until we recheck him on Monday (cutting back dose on Friday and Sunday).

## 2015-04-07 ENCOUNTER — Ambulatory Visit (INDEPENDENT_AMBULATORY_CARE_PROVIDER_SITE_OTHER): Payer: 59

## 2015-04-07 DIAGNOSIS — Z9889 Other specified postprocedural states: Secondary | ICD-10-CM

## 2015-04-07 DIAGNOSIS — Z7901 Long term (current) use of anticoagulants: Secondary | ICD-10-CM | POA: Diagnosis not present

## 2015-04-07 DIAGNOSIS — I2699 Other pulmonary embolism without acute cor pulmonale: Secondary | ICD-10-CM | POA: Diagnosis not present

## 2015-04-07 LAB — POCT INR: INR: 2.5

## 2015-04-14 ENCOUNTER — Ambulatory Visit (INDEPENDENT_AMBULATORY_CARE_PROVIDER_SITE_OTHER): Payer: 59 | Admitting: *Deleted

## 2015-04-14 ENCOUNTER — Other Ambulatory Visit: Payer: Self-pay | Admitting: Internal Medicine

## 2015-04-14 DIAGNOSIS — Z9889 Other specified postprocedural states: Secondary | ICD-10-CM | POA: Diagnosis not present

## 2015-04-14 DIAGNOSIS — Z7901 Long term (current) use of anticoagulants: Secondary | ICD-10-CM | POA: Diagnosis not present

## 2015-04-14 DIAGNOSIS — I2699 Other pulmonary embolism without acute cor pulmonale: Secondary | ICD-10-CM

## 2015-04-14 LAB — POCT INR: INR: 2.2

## 2015-04-14 MED FILL — BENAZEPRIL HCL 20 MG TABLET: 20 | 90 days supply | Qty: 90 | Fill #0

## 2015-04-14 MED FILL — EZETIMIBE 10 MG TABLET: 10 | 30 days supply | Qty: 30 | Fill #4

## 2015-04-18 ENCOUNTER — Telehealth: Payer: Self-pay

## 2015-04-18 NOTE — Telephone Encounter (Signed)
Pt called into clinic states he has been started on Zpack today 04/18/15.  Pt's INR was checked on 04/14/15 2.2, advised pt this medication can cause the INR to creep up slightly, but since INR was 2.2 on Monday advised pt he has some room to go up if medication does cause a slight elevation in INR.  Advised pt to eat a couple of extra servings of vit K foods while on abx to try and head off any effects of abx on INR.  Pt verbalized understanding.  Advised pt to call for sooner appt if notices any signs and symptoms of bleeding or increased bruising.

## 2015-05-12 ENCOUNTER — Ambulatory Visit (INDEPENDENT_AMBULATORY_CARE_PROVIDER_SITE_OTHER): Payer: 59

## 2015-05-12 DIAGNOSIS — Z7901 Long term (current) use of anticoagulants: Secondary | ICD-10-CM

## 2015-05-12 DIAGNOSIS — I2699 Other pulmonary embolism without acute cor pulmonale: Secondary | ICD-10-CM

## 2015-05-12 DIAGNOSIS — Z9889 Other specified postprocedural states: Secondary | ICD-10-CM | POA: Diagnosis not present

## 2015-05-12 LAB — POCT INR: INR: 2.4

## 2015-05-19 MED FILL — EZETIMIBE 10 MG TABLET: 10 | 30 days supply | Qty: 30 | Fill #5

## 2015-05-29 ENCOUNTER — Encounter: Payer: Self-pay | Admitting: Physician Assistant

## 2015-05-29 ENCOUNTER — Ambulatory Visit (INDEPENDENT_AMBULATORY_CARE_PROVIDER_SITE_OTHER): Payer: 59 | Admitting: Physician Assistant

## 2015-05-29 ENCOUNTER — Other Ambulatory Visit: Payer: Self-pay

## 2015-05-29 VITALS — BP 130/80 | HR 82 | Temp 97.5°F | Resp 16 | Ht 72.0 in | Wt 226.0 lb

## 2015-05-29 DIAGNOSIS — I10 Essential (primary) hypertension: Secondary | ICD-10-CM | POA: Diagnosis not present

## 2015-05-29 DIAGNOSIS — E669 Obesity, unspecified: Secondary | ICD-10-CM

## 2015-05-29 DIAGNOSIS — E785 Hyperlipidemia, unspecified: Secondary | ICD-10-CM

## 2015-05-29 DIAGNOSIS — N183 Chronic kidney disease, stage 3 (moderate): Secondary | ICD-10-CM

## 2015-05-29 DIAGNOSIS — E1122 Type 2 diabetes mellitus with diabetic chronic kidney disease: Secondary | ICD-10-CM | POA: Diagnosis not present

## 2015-05-29 DIAGNOSIS — E1129 Type 2 diabetes mellitus with other diabetic kidney complication: Secondary | ICD-10-CM | POA: Diagnosis not present

## 2015-05-29 DIAGNOSIS — R1030 Lower abdominal pain, unspecified: Secondary | ICD-10-CM | POA: Diagnosis not present

## 2015-05-29 DIAGNOSIS — Z79899 Other long term (current) drug therapy: Secondary | ICD-10-CM | POA: Diagnosis not present

## 2015-05-29 DIAGNOSIS — E559 Vitamin D deficiency, unspecified: Secondary | ICD-10-CM

## 2015-05-29 DIAGNOSIS — R1031 Right lower quadrant pain: Secondary | ICD-10-CM

## 2015-05-29 LAB — BASIC METABOLIC PANEL WITH GFR
BUN: 16 mg/dL (ref 7–25)
CALCIUM: 9.7 mg/dL (ref 8.6–10.3)
CO2: 25 mmol/L (ref 20–31)
Chloride: 103 mmol/L (ref 98–110)
Creat: 1.41 mg/dL — ABNORMAL HIGH (ref 0.70–1.25)
GFR, EST AFRICAN AMERICAN: 59 mL/min — AB (ref >=60)
GFR, EST NON AFRICAN AMERICAN: 51 mL/min — AB (ref >=60)
GLUCOSE: 111 mg/dL — AB (ref 65–99)
Potassium: 4.2 mmol/L (ref 3.5–5.3)
SODIUM: 139 mmol/L (ref 135–146)

## 2015-05-29 LAB — CBC WITH DIFFERENTIAL/PLATELET
BASOS PCT: 1 % (ref 0–1)
Basophils Absolute: 0 10*3/uL (ref 0.0–0.1)
EOS ABS: 0.2 10*3/uL (ref 0.0–0.7)
EOS PCT: 4 % (ref 0–5)
HEMATOCRIT: 45.4 % (ref 39.0–52.0)
Hemoglobin: 15.4 g/dL (ref 13.0–17.0)
Lymphocytes Relative: 41 % (ref 12–46)
Lymphs Abs: 1.6 10*3/uL (ref 0.7–4.0)
MCH: 30.4 pg (ref 26.0–34.0)
MCHC: 33.9 g/dL (ref 30.0–36.0)
MCV: 89.7 fL (ref 78.0–100.0)
MONO ABS: 0.3 10*3/uL (ref 0.1–1.0)
MPV: 11.4 fL (ref 8.6–12.4)
Monocytes Relative: 8 % (ref 3–12)
Neutro Abs: 1.8 10*3/uL (ref 1.7–7.7)
Neutrophils Relative %: 46 % (ref 43–77)
Platelets: 231 10*3/uL (ref 150–400)
RBC: 5.06 MIL/uL (ref 4.22–5.81)
RDW: 14.5 % (ref 11.5–15.5)
WBC: 3.9 10*3/uL — AB (ref 4.0–10.5)

## 2015-05-29 LAB — TSH: TSH: 1.07 mIU/L (ref 0.40–4.50)

## 2015-05-29 LAB — HEPATIC FUNCTION PANEL
ALT: 22 U/L (ref 9–46)
AST: 30 U/L (ref 10–35)
Albumin: 4.5 g/dL (ref 3.6–5.1)
Alkaline Phosphatase: 46 U/L (ref 40–115)
BILIRUBIN INDIRECT: 0.6 mg/dL (ref 0.2–1.2)
Bilirubin, Direct: 0.1 mg/dL (ref <=0.2)
TOTAL PROTEIN: 7.2 g/dL (ref 6.1–8.1)
Total Bilirubin: 0.7 mg/dL (ref 0.2–1.2)

## 2015-05-29 LAB — LIPID PANEL
CHOLESTEROL: 167 mg/dL (ref 125–200)
HDL: 47 mg/dL (ref >=40)
LDL Cholesterol: 101 mg/dL (ref <130)
TRIGLYCERIDES: 96 mg/dL (ref <150)
Total CHOL/HDL Ratio: 3.6 Ratio (ref <=5.0)
VLDL: 19 mg/dL (ref <30)

## 2015-05-29 LAB — MAGNESIUM: MAGNESIUM: 1.8 mg/dL (ref 1.5–2.5)

## 2015-05-29 NOTE — Patient Instructions (Signed)
I think it is possible that you have sleep apnea. It can cause interrupted sleep, headaches, frequent awakenings, fatigue, dry mouth, fast/slow heart beats, memory issues, anxiety/depression, swelling, numbness tingling hands/feet, weight gain, shortness of breath, and the list goes on. Sleep apnea needs to be ruled out because if it is left untreated it does eventually lead to abnormal heart beats, lung failure or heart failure as well as increasing the risk of heart attack and stroke. There are masks you can wear OR a mouth piece that I can give you information about. Often times though people feel MUCH better after getting treatment.   Sleep Apnea  Sleep apnea is a sleep disorder characterized by abnormal pauses in breathing while you sleep. When your breathing pauses, the level of oxygen in your blood decreases. This causes you to move out of deep sleep and into light sleep. As a result, your quality of sleep is poor, and the system that carries your blood throughout your body (cardiovascular system) experiences stress. If sleep apnea remains untreated, the following conditions can develop:  High blood pressure (hypertension).  Coronary artery disease.  Inability to achieve or maintain an erection (impotence).  Impairment of your thought process (cognitive dysfunction). There are three types of sleep apnea: 1. Obstructive sleep apnea--Pauses in breathing during sleep because of a blocked airway. 2. Central sleep apnea--Pauses in breathing during sleep because the area of the brain that controls your breathing does not send the correct signals to the muscles that control breathing. 3. Mixed sleep apnea--A combination of both obstructive and central sleep apnea.  RISK FACTORS The following risk factors can increase your risk of developing sleep apnea:  Being overweight.  Smoking.  Having narrow passages in your nose and throat.  Being of older age.  Being male.  Alcohol use.   Sedative and tranquilizer use.  Ethnicity. Among individuals younger than 35 years, African Americans are at increased risk of sleep apnea. SYMPTOMS   Difficulty staying asleep.  Daytime sleepiness and fatigue.  Loss of energy.  Irritability.  Loud, heavy snoring.  Morning headaches.  Trouble concentrating.  Forgetfulness.  Decreased interest in sex. DIAGNOSIS  In order to diagnose sleep apnea, your caregiver will perform a physical examination. Your caregiver may suggest that you take a home sleep test. Your caregiver may also recommend that you spend the night in a sleep lab. In the sleep lab, several monitors record information about your heart, lungs, and brain while you sleep. Your leg and arm movements and blood oxygen level are also recorded. TREATMENT The following actions may help to resolve mild sleep apnea:  Sleeping on your side.   Using a decongestant if you have nasal congestion.   Avoiding the use of depressants, including alcohol, sedatives, and narcotics.   Losing weight and modifying your diet if you are overweight. There also are devices and treatments to help open your airway:  Oral appliances. These are custom-made mouthpieces that shift your lower jaw forward and slightly open your bite. This opens your airway.  Devices that create positive airway pressure. This positive pressure "splints" your airway open to help you breathe better during sleep. The following devices create positive airway pressure:  Continuous positive airway pressure (CPAP) device. The CPAP device creates a continuous level of air pressure with an air pump. The air is delivered to your airway through a mask while you sleep. This continuous pressure keeps your airway open.  Nasal expiratory positive airway pressure (EPAP) device. The EPAP device  creates positive air pressure as you exhale. The device consists of single-use valves, which are inserted into each nostril and held in  place by adhesive. The valves create very little resistance when you inhale but create much more resistance when you exhale. That increased resistance creates the positive airway pressure. This positive pressure while you exhale keeps your airway open, making it easier to breath when you inhale again.  Bilevel positive airway pressure (BPAP) device. The BPAP device is used mainly in patients with central sleep apnea. This device is similar to the CPAP device because it also uses an air pump to deliver continuous air pressure through a mask. However, with the BPAP machine, the pressure is set at two different levels. The pressure when you exhale is lower than the pressure when you inhale.  Surgery. Typically, surgery is only done if you cannot comply with less invasive treatments or if the less invasive treatments do not improve your condition. Surgery involves removing excess tissue in your airway to create a wider passage way. Document Released: 02/26/2002 Document Revised: 07/03/2012 Document Reviewed: 07/15/2011 [facility] Patient Information 2015 [place], [place]. This information is not intended to replace advice given to you by your health care provider. Make sure you discuss any questions you have with your health care provider.     Inguinal Hernia, Adult Muscles help keep everything in the body in its proper place. But if a weak spot in the muscles develops, something can poke through. That is called a hernia. When this happens in the lower part of the belly (abdomen), it is called an inguinal hernia. (It takes its name from a part of the body in this region called the inguinal canal.) A weak spot in the wall of muscles lets some fat or part of the small intestine bulge through. An inguinal hernia can develop at any age. Men get them more often than women. CAUSES  In adults, an inguinal hernia develops over time.  It can be triggered by:  Suddenly straining the muscles of the lower  abdomen.  Lifting heavy objects.  Straining to have a bowel movement. Difficult bowel movements (constipation) can lead to this.  Constant coughing. This may be caused by smoking or lung disease.  Being overweight.  Being pregnant.  Working at a job that requires long periods of standing or heavy lifting.  Having had an inguinal hernia before. One type can be an emergency situation. It is called a strangulated inguinal hernia. It develops if part of the small intestine slips through the weak spot and cannot get back into the abdomen. The blood supply can be cut off. If that happens, part of the intestine may die. This situation requires emergency surgery. SYMPTOMS  Often, a small inguinal hernia has no symptoms. It is found when a healthcare provider does a physical exam. Larger hernias usually have symptoms.   In adults, symptoms may include:  A lump in the groin. This is easier to see when the person is standing. It might disappear when lying down.  In men, a lump in the scrotum.  Pain or burning in the groin. This occurs especially when lifting, straining or coughing.  A dull ache or feeling of pressure in the groin.  Signs of a strangulated hernia can include:  A bulge in the groin that becomes very painful and tender to the touch.  A bulge that turns red or purple.  Fever, nausea and vomiting.  Inability to have a bowel movement or to pass  gas. DIAGNOSIS  To decide if you have an inguinal hernia, a healthcare provider will probably do a physical examination.  This will include asking questions about any symptoms you have noticed.  The healthcare provider might feel the groin area and ask you to cough. If an inguinal hernia is felt, the healthcare provider may try to slide it back into the abdomen.  Usually no other tests are needed. TREATMENT  Treatments can vary. The size of the hernia makes a difference. Options include:  Watchful waiting. This is often  suggested if the hernia is small and you have had no symptoms.  No medical procedure will be done unless symptoms develop.  You will need to watch closely for symptoms. If any occur, contact your healthcare provider right away.  Surgery. This is used if the hernia is larger or you have symptoms.  Open surgery. This is usually an outpatient procedure (you will not stay overnight in a hospital). An cut (incision) is made through the skin in the groin. The hernia is put back inside the abdomen. The weak area in the muscles is then repaired by herniorrhaphy or hernioplasty. Herniorrhaphy: in this type of surgery, the weak muscles are sewn back together. Hernioplasty: a patch or mesh is used to close the weak area in the abdominal wall.  Laparoscopy. In this procedure, a surgeon makes small incisions. A thin tube with a tiny video camera (called a laparoscope) is put into the abdomen. The surgeon repairs the hernia with mesh by looking with the video camera and using two long instruments. HOME CARE INSTRUCTIONS   After surgery to repair an inguinal hernia:  You will need to take pain medicine prescribed by your healthcare provider. Follow all directions carefully.  You will need to take care of the wound from the incision.  Your activity will be restricted for awhile. This will probably include no heavy lifting for several weeks. You also should not do anything too active for a few weeks. When you can return to work will depend on the type of job that you have.  During "watchful waiting" periods, you should:  Maintain a healthy weight.  Eat a diet high in fiber (fruits, vegetables and whole grains).  Drink plenty of fluids to avoid constipation. This means drinking enough water and other liquids to keep your urine clear or pale yellow.  Do not lift heavy objects.  Do not stand for long periods of time.  Quit smoking. This should keep you from developing a frequent cough. SEEK MEDICAL  CARE IF:   A bulge develops in your groin area.  You feel pain, a burning sensation or pressure in the groin. This might be worse if you are lifting or straining.  You develop a fever of more than 100.5 F (38.1 C). SEEK IMMEDIATE MEDICAL CARE IF:   Pain in the groin increases suddenly.  A bulge in the groin gets bigger suddenly and does not go down.  For men, there is sudden pain in the scrotum. Or, the size of the scrotum increases.  A bulge in the groin area becomes red or purple and is painful to touch.  You have nausea or vomiting that does not go away.  You feel your heart beating much faster than normal.  You cannot have a bowel movement or pass gas.  You develop a fever of more than 102.0 F (38.9 C).   This information is not intended to replace advice given to you by your health care  provider. Make sure you discuss any questions you have with your health care provider.   Document Released: 07/25/2008 Document Revised: 05/31/2011 Document Reviewed: 09/09/2014 [employer] Interactive Patient Education [employer]

## 2015-05-29 NOTE — Progress Notes (Signed)
Patient ID: [name], male   DOB: 01-08-1946, 70 y.o.   MRN: [id number]  Assessment and Plan:  Hypertension:  -Continue medication -monitor blood pressure at home. -Continue DASH diet -Reminder to go to the ER if any CP, SOB, nausea, dizziness, severe HA, changes vision/speech, left arm numbness and tingling and jaw pain.  Cholesterol - Continue diet and exercise -Check cholesterol.   Diabetes with diabetic chronic kidney disease -Continue diet and exercise.  -Check A1C  Vitamin D Def -check level -continue medications.   Obesity with co morbidities - long discussion about weight loss, diet, and exercise  Inguinal Hernia Get [place] explained not issues unless strangulation, go to ER if worsening    Continue diet and meds as discussed. Further disposition pending results of labs. Discussed med's effects and SE's.    HPI 70 y.o. male  presents for 3 month follow up with hypertension, hyperlipidemia, diabetes and vitamin D deficiency.   His blood pressure has been controlled at home, today their BP is BP: 130/80 mmHg.He does not workout, states will pick up in the next week or so. Marland Kitchen He denies chest pain, shortness of breath, dizziness.    He has history of MVR and is on coumadin, follows with coumadin clinic.  Lab Results  Component Value Date   INR 2.4 05/12/2015   INR 2.2 04/14/2015   INR 2.5 04/07/2015   PROTIME 18.2 09/03/2008   He has allergies, has started on claritin.   Has been having right sided groin/inguinal pain x months, worse with lifting heavy objects, will radiate across suprapubic, no pain into testicles. Has urinary frequency but no incontinence, hesitancy, dribbling, has some constipation but has started fiber that has helped.    He is on cholesterol medication and denies myalgias. His cholesterol is at goal. The cholesterol was:  02/18/2015: Cholesterol 167; HDL 50; LDL Cholesterol 102; Triglycerides 73   He has been working on diet and exercise for  diabetes with diabetic chronic kidney disease, he is on bASA, he is on ACE/ARB, he does not check his sugars at home, and denies  foot ulcerations, hyperglycemia, hypoglycemia , increased appetite, nausea, paresthesia of the feet, polydipsia, polyuria, visual disturbances, vomiting and weight loss. Last A1C was: 02/18/2015: Hgb A1c MFr Bld 6.4*   Patient is on Vitamin D supplement. 02/18/2015: Vit D, 25-Hydroxy 46  BMI is Body mass index is 30.64 kg/(m^2)., he is working on diet and exercise. Wt Readings from Last 3 Encounters:  05/29/15 226 lb (102.513 kg)  04/02/15 221 lb (100.245 kg)  02/18/15 223 lb 6.4 oz (101.334 kg)    Current Medications:  Current Outpatient Prescriptions on File Prior to Visit  Medication Sig Dispense Refill  . acetaminophen (TYLENOL) 500 MG tablet Take 500 mg by mouth every 6 (six) hours as needed for pain.    Marland Kitchen amoxicillin (AMOXIL) 500 MG capsule Take 500 mg by mouth 3 (three) times daily. Reported on 05/29/2015    . benazepril (LOTENSIN) 20 MG tablet TAKE 1 TABLET BY MOUTH ONCE DAILY 90 tablet 3  . Cholecalciferol (VITAMIN D PO) Take by mouth daily. TAKING 5,000 UNITS 4 TIMES A WEEK AN 2,000 UNITS 3 TIMES A WEEK    . clotrimazole-betamethasone (LOTRISONE) cream Apply 1 application topically 2 (two) times daily. PRN    . ezetimibe (ZETIA) 10 MG tablet Take 1 tablet daily for Cholesterol 90 tablet 99  . fenofibrate micronized (LOFIBRA) 134 MG capsule TAKE 1 CAPSULE BY MOUTH EVERY EVENING. 90 capsule 3  .  Loratadine 10 MG CAPS Take by mouth.    . magnesium gluconate (MAGONATE) 500 MG tablet Take 500 mg by mouth daily. Has 250 mg tablet is taking 2 tablets    . mometasone (NASONEX) 50 MCG/ACT nasal spray Place 2 sprays into the nose daily.    Marland Kitchen warfarin (COUMADIN) 5 MG tablet TAKE AS DIRECTED BY COUMADIN CLINIC 160 tablet 0   No current facility-administered medications on file prior to visit.   Medical History:  Past Medical History  Diagnosis Date  . Pulmonary  embolism [facility])     march 2010, 2012              /   DVT x  2  LEFT 2010  . Coronary artery disease     non-obstructive by cath 2009  . Pre-diabetes   . Peripheral vascular disease (HCC)   . Severe mitral regurgitation by prior echocardiogram   . Sleep apnea     STOP BANG SCORE 4  . S/P mitral valve repair 08/15/2012    Complex valvuloplasty including triangular resection of posterior leaflet, artificial Gore-tex neocord placement x4 and Sorin Memo 3D ring annuloplasty via right mini thoracotomy approach  . Hyperlipidemia   . Hypertension     Clearance with note Dr Marvel Plan on chart,  Lovonox bridging  ordered by Dr Sanjuana Kava    EKG 9/12, chst 9/12 EPIC  . Heart murmur   . Seasonal allergies   . Osteoarthritis   . Vitamin D deficiency   . Degenerative joint disease   . Allergy     SEASONAL  . Cataract     SMALL IN LEFT EYE   Allergies:  Allergies  Allergen Reactions  . Statins     REACTION: muscle aches  . Welchol [Colesevelam Hcl]      Review of Systems:  Review of Systems  Constitutional: Negative for fever and chills.  HENT: Negative for congestion, ear pain and sore throat.   Eyes: Negative.   Respiratory: Negative for cough, shortness of breath and wheezing.   Cardiovascular: Negative for chest pain, palpitations and leg swelling.  Gastrointestinal: Negative for heartburn, diarrhea, constipation, blood in stool and melena.  Genitourinary: Negative.        Right groin pain  Skin: Negative.   Neurological: Negative for dizziness, loss of consciousness and headaches.  Psychiatric/Behavioral: Negative for depression. The patient is not nervous/anxious and does not have insomnia.     Family history- Review and unchanged  Social history- Review and unchanged  Physical Exam: BP 130/80 mmHg  Pulse 82  Temp(Src) 97.5 F (36.4 C) (Temporal)  Resp 16  Ht 6' (1.829 m)  Wt 226 lb (102.513 kg)  BMI 30.64 kg/m2  SpO2 97% Wt Readings from Last 3 Encounters:  05/29/15  226 lb (102.513 kg)  04/02/15 221 lb (100.245 kg)  02/18/15 223 lb 6.4 oz (101.334 kg)   General Appearance: Well nourished well developed, non-toxic appearing, in no apparent distress. Eyes: PERRLA, EOMs, conjunctiva no swelling or erythema ENT/Mouth: Ear canals clear with no erythema, swelling, or discharge.  TMs normal bilaterally, oropharynx clear, moist, with no exudate.  Crowded mouth.  Neck: Supple, thyroid normal, no JVD, no cervical adenopathy.  Respiratory: Respiratory effort normal, breath sounds clear A&P, no wheeze, rhonchi or rales noted.  No retractions, no accessory muscle usage Cardio: RRR with no MRGs. No noted edema.  Abdomen: Soft, + BS.  Non tender, obese with ventral hernia, + tender bulge right inguinal area, worse with cough, easily  reproducible, very small.  Musculoskeletal: Full ROM, 5/5 strength, Normal gait Skin: Warm, dry without rashes, lesions, ecchymosis.  Neuro: Awake and oriented X 3, Cranial nerves intact. No cerebellar symptoms.  Psych: normal affect, Insight and Judgment appropriate.    Quentin Mulling, PA-C 9:02 AM [facility]

## 2015-05-30 LAB — HEMOGLOBIN A1C
Hgb A1c MFr Bld: 6.7 % — ABNORMAL HIGH (ref <5.7)
Mean Plasma Glucose: 146 mg/dL — ABNORMAL HIGH (ref <117)

## 2015-05-30 LAB — VITAMIN D 25 HYDROXY (VIT D DEFICIENCY, FRACTURES): VIT D 25 HYDROXY: 41 ng/mL (ref 30–100)

## 2015-06-03 ENCOUNTER — Other Ambulatory Visit: Payer: Self-pay | Admitting: Physician Assistant

## 2015-06-03 ENCOUNTER — Ambulatory Visit (HOSPITAL_COMMUNITY)
Admission: RE | Admit: 2015-06-03 | Discharge: 2015-06-03 | Disposition: A | Discharge status: Home / Self Care | Payer: 59 | Source: Ambulatory Visit | Attending: Physician Assistant | Admitting: Physician Assistant

## 2015-06-03 DIAGNOSIS — K4090 Unilateral inguinal hernia, without obstruction or gangrene, not specified as recurrent: Secondary | ICD-10-CM | POA: Insufficient documentation

## 2015-06-03 DIAGNOSIS — R1031 Right lower quadrant pain: Secondary | ICD-10-CM

## 2015-06-03 DIAGNOSIS — R1030 Lower abdominal pain, unspecified: Secondary | ICD-10-CM | POA: Insufficient documentation

## 2015-06-04 ENCOUNTER — Encounter: Payer: Self-pay | Admitting: Internal Medicine

## 2015-06-10 ENCOUNTER — Other Ambulatory Visit: Payer: Self-pay | Admitting: Cardiovascular Disease

## 2015-06-10 MED FILL — FENOFIBRATE 134 MG CAPSULE: 134 | 90 days supply | Qty: 90 | Fill #2

## 2015-06-10 MED FILL — WARFARIN SODIUM 5 MG TABLET: 5 | 90 days supply | Qty: 160 | Fill #0

## 2015-06-12 DIAGNOSIS — K4090 Unilateral inguinal hernia, without obstruction or gangrene, not specified as recurrent: Secondary | ICD-10-CM | POA: Diagnosis not present

## 2015-06-13 ENCOUNTER — Encounter: Payer: Self-pay | Admitting: Cardiovascular Disease

## 2015-06-13 ENCOUNTER — Ambulatory Visit (INDEPENDENT_AMBULATORY_CARE_PROVIDER_SITE_OTHER): Payer: 59 | Admitting: Cardiovascular Disease

## 2015-06-13 VITALS — BP 130/76 | HR 64 | Ht 72.0 in | Wt 229.8 lb

## 2015-06-13 DIAGNOSIS — I340 Nonrheumatic mitral (valve) insufficiency: Secondary | ICD-10-CM

## 2015-06-13 DIAGNOSIS — I2699 Other pulmonary embolism without acute cor pulmonale: Secondary | ICD-10-CM | POA: Diagnosis not present

## 2015-06-13 DIAGNOSIS — I428 Other cardiomyopathies: Secondary | ICD-10-CM

## 2015-06-13 DIAGNOSIS — I2510 Atherosclerotic heart disease of native coronary artery without angina pectoris: Secondary | ICD-10-CM | POA: Diagnosis not present

## 2015-06-13 DIAGNOSIS — I429 Cardiomyopathy, unspecified: Secondary | ICD-10-CM | POA: Diagnosis not present

## 2015-06-13 NOTE — Patient Instructions (Signed)

## 2015-06-13 NOTE — Progress Notes (Signed)
Chief Complaint  Patient presents with  . Follow-up    History of Present Illness:  70 yo male with history of HTN, recurrent pulmonary emboli on anticoagulation, nonobstructive CAD and prior MV prolapse with MR now s/p MV repair May 2014 per Dr. Cornelius Moras who is here today for cardiac follow up. Post op course was uneventful but he did have some PVCs and short runs of VT and was started on beta blocker therapy. Cardiac cath April 2014 with mild CAD. He had a hip replacement 2009 and presented with SOB March 2010 and was found to have bilateral pulmonary emboli. Repeat studies without PE or DVT so coumadin therapy was stopped. Readmitted to [facility] October 31, 2010 with SOB and found to have recurrent Pulmonary Embolism by CTA. He has been on coumadin chronically since then. I saw him 11/27/12 after he had several episodes of dizziness. The first was while driving. He did not pass out. Several subsequent episodes of mild dizziness. No associated palpitations or awareness of irregularity of his heart rhythm. No chest pain or SOB. He has been in cardiac rehab and doing well with exercise. I arranged a 21 day event monitor which showed sinus rhythm with PVCs but no other arrythmias. Echo May 2016 with LVEF=40%, stable mitral valve repair.   He is here today for follow up. He has been doing well. No chest pain or SOB. No dizziness, near syncope or syncope.   Primary Care Physician: Lucky Cowboy  Past Medical History  Diagnosis Date  . Pulmonary embolism [facility])     march 2010, 2012              /   DVT x  2  LEFT 2010  . Coronary artery disease     non-obstructive by cath 2009  . Pre-diabetes   . Peripheral vascular disease (HCC)   . Severe mitral regurgitation by prior echocardiogram   . Sleep apnea     STOP BANG SCORE 4  . S/P mitral valve repair 08/15/2012    Complex valvuloplasty including triangular resection of posterior leaflet, artificial Gore-tex neocord placement x4 and Sorin Memo 3D  ring annuloplasty via right mini thoracotomy approach  . Hyperlipidemia   . Hypertension     Clearance with note Dr Marvel Plan on chart,  Lovonox bridging  ordered by Dr Sanjuana Kava    EKG 9/12, chst 9/12 EPIC  . Heart murmur   . Seasonal allergies   . Osteoarthritis   . Vitamin D deficiency   . Degenerative joint disease   . Allergy     SEASONAL  . Cataract     SMALL IN LEFT EYE    Past Surgical History  Procedure Laterality Date  . Achillis tendon      repair 20 years ago  . Hip arthroplasty      11/06/07  . Total hip arthroplasty  08/24/2011    Procedure: TOTAL HIP ARTHROPLASTY ANTERIOR APPROACH;  Surgeon: Shelda Pal, MD;  Location: [facility] ORS;  Service: Orthopedics;  Laterality: Right;  . Tee without cardioversion N/A 07/04/2012    Procedure: TRANSESOPHAGEAL ECHOCARDIOGRAM (TEE);  Surgeon: Laurey Morale, MD;  Location: [facility] ENDOSCOPY;  Service: Cardiovascular;  Laterality: N/A;  . Cardiac catheterization    . Mitral valve repair Right 08/15/2012    Procedure: MINIMALLY INVASIVE MITRAL VALVE REPAIR (MVR);  Surgeon: Purcell Nails, MD;  Location: [facility] OR;  Service: Open Heart Surgery;  Laterality: Right;  . Intraoperative transesophageal echocardiogram N/A 08/15/2012  Procedure: INTRAOPERATIVE TRANSESOPHAGEAL ECHOCARDIOGRAM;  Surgeon: Purcell Nails, MD;  Location: [facility] OR;  Service: Open Heart Surgery;  Laterality: N/A;  . Colonoscopy      Current Outpatient Prescriptions  Medication Sig Dispense Refill  . acetaminophen (TYLENOL) 500 MG tablet Take 500 mg by mouth every 6 (six) hours as needed for pain.    Marland Kitchen amoxicillin (AMOXIL) 500 MG capsule Take 500 mg by mouth 3 (three) times daily. Reported on 05/29/2015    . benazepril (LOTENSIN) 10 MG tablet Take 10 mg by mouth daily.    . Cholecalciferol (VITAMIN D PO) Take by mouth daily. TAKING 5,000 UNITS 4 TIMES A WEEK AN 2,000 UNITS 3 TIMES A WEEK    . clotrimazole-betamethasone (LOTRISONE) cream Apply 1 application topically 2 (two) times  daily. PRN    . ezetimibe (ZETIA) 10 MG tablet Take 1 tablet daily for Cholesterol 90 tablet 99  . fenofibrate micronized (LOFIBRA) 134 MG capsule TAKE 1 CAPSULE BY MOUTH EVERY EVENING. 90 capsule 3  . Loratadine 10 MG CAPS Take by mouth.    . magnesium gluconate (MAGONATE) 500 MG tablet Take 500 mg by mouth daily. Has 250 mg tablet is taking 2 tablets    . mometasone (NASONEX) 50 MCG/ACT nasal spray Place 2 sprays into the nose daily.    Marland Kitchen warfarin (COUMADIN) 5 MG tablet TAKE AS DIRECTED BY COUMADIN CLINIC 160 tablet 0   No current facility-administered medications for this visit.    Allergies  Allergen Reactions  . Statins     REACTION: muscle aches  . Welchol [Colesevelam Hcl]     Social History   Social History  . Marital Status: Married    Spouse Name: N/A  . Number of Children: 3  . Years of Education: N/A   Occupational History  . Industrial hygeinist    Social History Main Topics  . Smoking status: Former Smoker    Types: Cigars    Quit date: 03/22/1973  . Smokeless tobacco: Current User    Types: Chew     Comment: uses smokeless tobacco  . Alcohol Use: No  . Drug Use: No  . Sexual Activity: Not Currently   Other Topics Concern  . Not on file   Social History Narrative    Family History  Problem Relation Age of Onset  . Coronary artery disease    . Heart attack Father   . Heart attack Brother 60  . Hypertension Father   . Hypertension Sister   . Hypertension Brother   . Hyperlipidemia Sister   . Diabetes Sister   . Diabetes Brother     Review of Systems:  As stated in the HPI and otherwise negative.   BP 130/76 mmHg  Pulse 64  Ht 6' (1.829 m)  Wt 229 lb 12.8 oz (104.237 kg)  BMI 31.16 kg/m2  SpO2 98%  Physical Examination: General: Well developed, well nourished, NAD HEENT: OP clear, mucus membranes moist SKIN: warm, dry. No rashes. Neuro: No focal deficits Musculoskeletal: Muscle strength 5/5 all ext Psychiatric: Mood and affect  normal Neck: No JVD, no carotid bruits, no thyromegaly, no lymphadenopathy. Lungs:Clear bilaterally, no wheezes, rhonci, crackles Cardiovascular: Regular rate and rhythm. No murmurs, gallops or rubs. Abdomen:Soft. Bowel sounds present. Non-tender.  Extremities: No lower extremity edema. Pulses are 2 + in the bilateral DP/PT.  Echo 08/13/14: Left ventricle: The cavity size was moderately dilated. Wall  thickness was normal. Systolic function was moderately reduced.  The estimated ejection fraction was in the  range of 35% to 40%.  Diffuse hypokinesis. - Mitral valve: Intact MV repair with annuloplasty ring and trivial  residual MR. - Left atrium: The atrium was mildly dilated. - Atrial septum: No defect or patent foramen ovale was identified.  EKG:  EKG is ordered today. The ekg ordered today demonstrates NSR, rate 64 bpm. Incomplete RBBB.    Recent Labs: 05/29/2015: ALT 22; BUN 16; Creat 1.41*; Hemoglobin 15.4; Magnesium 1.8; Platelets 231; Potassium 4.2; Sodium 139; TSH 1.07   Lipid Panel    Component Value Date/Time   CHOL 167 05/29/2015 0924   TRIG 96 05/29/2015 0924   HDL 47 05/29/2015 0924   CHOLHDL 3.6 05/29/2015 0924   VLDL 19 05/29/2015 0924   LDLCALC 101 05/29/2015 0924     Wt Readings from Last 3 Encounters:  06/13/15 229 lb 12.8 oz (104.237 kg)  05/29/15 226 lb (102.513 kg)  04/02/15 221 lb (100.245 kg)     Other studies Reviewed: Additional studies/ records that were reviewed today include:. Review of the above records demonstrates:    Assessment and Plan:   1. MITRAL REGURGITATION: s/p Mitral valve repair. Stable by echo May 2016.    2. Recurrent DVT/PE: He will need coumadin for lifetime. Followed in coumadin clinic .  3. CAD, NATIVE VESSEL: Stable. No changes. No ASA since he is on long term coumadin. No beta blocker with bradycardia.   4. Cardiomyopathy, non-ischemic: Will continue Ace-inh. No beta blocker with bradycardia.   Current medicines  are reviewed at length with the patient today.  The patient does not have concerns regarding medicines.  The following changes have been made:  no change  Labs/ tests ordered today include:  Orders Placed This Encounter  Procedures  . EKG 12-Lead    Disposition:   FU with me in 12  months  Signed, Verne Carrow, MD 06/13/2015 5:57 PM    [facility] [address] [place], [place], [place]  [address] Phone: [phone]; Fax: [phone]

## 2015-06-18 MED FILL — EZETIMIBE 10 MG TABLET: 10 | 30 days supply | Qty: 30 | Fill #6

## 2015-06-23 ENCOUNTER — Ambulatory Visit (INDEPENDENT_AMBULATORY_CARE_PROVIDER_SITE_OTHER): Payer: 59 | Admitting: *Deleted

## 2015-06-23 DIAGNOSIS — Z9889 Other specified postprocedural states: Secondary | ICD-10-CM | POA: Diagnosis not present

## 2015-06-23 DIAGNOSIS — I2699 Other pulmonary embolism without acute cor pulmonale: Secondary | ICD-10-CM

## 2015-06-23 DIAGNOSIS — Z7901 Long term (current) use of anticoagulants: Secondary | ICD-10-CM

## 2015-06-23 LAB — POCT INR: INR: 2.7

## 2015-07-14 MED FILL — EZETIMIBE 10 MG TABLET: 10 | 90 days supply | Qty: 90 | Fill #7

## 2015-07-25 ENCOUNTER — Ambulatory Visit: Payer: 59 | Admitting: Cardiovascular Disease

## 2015-08-04 ENCOUNTER — Ambulatory Visit (INDEPENDENT_AMBULATORY_CARE_PROVIDER_SITE_OTHER): Payer: 59

## 2015-08-04 DIAGNOSIS — I2699 Other pulmonary embolism without acute cor pulmonale: Secondary | ICD-10-CM | POA: Diagnosis not present

## 2015-08-04 DIAGNOSIS — Z7901 Long term (current) use of anticoagulants: Secondary | ICD-10-CM

## 2015-08-04 DIAGNOSIS — Z9889 Other specified postprocedural states: Secondary | ICD-10-CM | POA: Diagnosis not present

## 2015-08-04 LAB — POCT INR: INR: 3.3

## 2015-08-06 ENCOUNTER — Encounter: Payer: Self-pay | Admitting: Internal Medicine

## 2015-08-15 ENCOUNTER — Ambulatory Visit: Payer: 59 | Admitting: Cardiovascular Disease

## 2015-09-01 ENCOUNTER — Ambulatory Visit (INDEPENDENT_AMBULATORY_CARE_PROVIDER_SITE_OTHER): Payer: 59 | Admitting: *Deleted

## 2015-09-01 DIAGNOSIS — Z9889 Other specified postprocedural states: Secondary | ICD-10-CM

## 2015-09-01 DIAGNOSIS — Z7901 Long term (current) use of anticoagulants: Secondary | ICD-10-CM | POA: Diagnosis not present

## 2015-09-01 DIAGNOSIS — I2699 Other pulmonary embolism without acute cor pulmonale: Secondary | ICD-10-CM

## 2015-09-01 LAB — POCT INR: INR: 3

## 2015-09-01 MED FILL — FENOFIBRATE 134 MG CAPSULE: 134 | 90 days supply | Qty: 90 | Fill #3

## 2015-09-22 ENCOUNTER — Other Ambulatory Visit: Payer: Self-pay | Admitting: Cardiovascular Disease

## 2015-09-22 ENCOUNTER — Ambulatory Visit (INDEPENDENT_AMBULATORY_CARE_PROVIDER_SITE_OTHER): Payer: 59 | Admitting: Pharmacist

## 2015-09-22 DIAGNOSIS — Z9889 Other specified postprocedural states: Secondary | ICD-10-CM

## 2015-09-22 DIAGNOSIS — I2699 Other pulmonary embolism without acute cor pulmonale: Secondary | ICD-10-CM | POA: Diagnosis not present

## 2015-09-22 DIAGNOSIS — Z7901 Long term (current) use of anticoagulants: Secondary | ICD-10-CM | POA: Diagnosis not present

## 2015-09-22 LAB — POCT INR: INR: 2.9

## 2015-09-22 MED FILL — WARFARIN SODIUM 5 MG TABLET: 5 | 90 days supply | Qty: 160 | Fill #0

## 2015-09-25 ENCOUNTER — Ambulatory Visit (INDEPENDENT_AMBULATORY_CARE_PROVIDER_SITE_OTHER): Payer: 59 | Admitting: Internal Medicine

## 2015-09-25 ENCOUNTER — Encounter: Payer: Self-pay | Admitting: Internal Medicine

## 2015-09-25 VITALS — BP 130/84 | HR 72 | Temp 97.9°F | Resp 16 | Ht 72.0 in | Wt 223.6 lb

## 2015-09-25 DIAGNOSIS — Z79899 Other long term (current) drug therapy: Secondary | ICD-10-CM

## 2015-09-25 DIAGNOSIS — Z1212 Encounter for screening for malignant neoplasm of rectum: Secondary | ICD-10-CM

## 2015-09-25 DIAGNOSIS — Z9889 Other specified postprocedural states: Secondary | ICD-10-CM

## 2015-09-25 DIAGNOSIS — E785 Hyperlipidemia, unspecified: Secondary | ICD-10-CM

## 2015-09-25 DIAGNOSIS — I10 Essential (primary) hypertension: Secondary | ICD-10-CM | POA: Diagnosis not present

## 2015-09-25 DIAGNOSIS — N183 Chronic kidney disease, stage 3 (moderate): Secondary | ICD-10-CM

## 2015-09-25 DIAGNOSIS — E669 Obesity, unspecified: Secondary | ICD-10-CM | POA: Diagnosis not present

## 2015-09-25 DIAGNOSIS — Z125 Encounter for screening for malignant neoplasm of prostate: Secondary | ICD-10-CM

## 2015-09-25 DIAGNOSIS — E559 Vitamin D deficiency, unspecified: Secondary | ICD-10-CM | POA: Diagnosis not present

## 2015-09-25 DIAGNOSIS — Z136 Encounter for screening for cardiovascular disorders: Secondary | ICD-10-CM

## 2015-09-25 DIAGNOSIS — E1122 Type 2 diabetes mellitus with diabetic chronic kidney disease: Secondary | ICD-10-CM | POA: Diagnosis not present

## 2015-09-25 LAB — CBC WITH DIFFERENTIAL/PLATELET
BASOS PCT: 1 %
Basophils Absolute: 43 cells/uL (ref 0–200)
EOS ABS: 86 {cells}/uL (ref 15–500)
Eosinophils Relative: 2 %
HEMATOCRIT: 45 % (ref 38.5–50.0)
Hemoglobin: 15.5 g/dL (ref 13.2–17.1)
LYMPHS ABS: 1892 {cells}/uL (ref 850–3900)
Lymphocytes Relative: 44 %
MCH: 30.7 pg (ref 27.0–33.0)
MCHC: 34.4 g/dL (ref 32.0–36.0)
MCV: 89.1 fL (ref 80.0–100.0)
MONO ABS: 258 {cells}/uL (ref 200–950)
MONOS PCT: 6 %
MPV: 11.3 fL (ref 7.5–12.5)
NEUTROS ABS: 2021 {cells}/uL (ref 1500–7800)
Neutrophils Relative %: 47 %
PLATELETS: 243 10*3/uL (ref 140–400)
RBC: 5.05 MIL/uL (ref 4.20–5.80)
RDW: 14.3 % (ref 11.0–15.0)
WBC: 4.3 10*3/uL (ref 3.8–10.8)

## 2015-09-25 LAB — HEPATIC FUNCTION PANEL
ALBUMIN: 4.4 g/dL (ref 3.6–5.1)
ALK PHOS: 40 U/L (ref 40–115)
ALT: 24 U/L (ref 9–46)
AST: 29 U/L (ref 10–35)
BILIRUBIN TOTAL: 0.6 mg/dL (ref 0.2–1.2)
Bilirubin, Direct: 0.1 mg/dL (ref <=0.2)
Indirect Bilirubin: 0.5 mg/dL (ref 0.2–1.2)
Total Protein: 7 g/dL (ref 6.1–8.1)

## 2015-09-25 LAB — BASIC METABOLIC PANEL WITH GFR
BUN: 18 mg/dL (ref 7–25)
CHLORIDE: 103 mmol/L (ref 98–110)
CO2: 16 mmol/L — ABNORMAL LOW (ref 20–31)
Calcium: 9.6 mg/dL (ref 8.6–10.3)
Creat: 1.49 mg/dL — ABNORMAL HIGH (ref 0.70–1.25)
GFR, Est African American: 55 mL/min — ABNORMAL LOW (ref >=60)
GFR, Est Non African American: 47 mL/min — ABNORMAL LOW (ref >=60)
Glucose, Bld: 113 mg/dL — ABNORMAL HIGH (ref 65–99)
POTASSIUM: 4.2 mmol/L (ref 3.5–5.3)
Sodium: 138 mmol/L (ref 135–146)

## 2015-09-25 LAB — LIPID PANEL
CHOL/HDL RATIO: 2.9 ratio (ref <=5.0)
Cholesterol: 150 mg/dL (ref 125–200)
HDL: 52 mg/dL (ref >=40)
LDL CALC: 85 mg/dL (ref <130)
Triglycerides: 67 mg/dL (ref <150)
VLDL: 13 mg/dL (ref <30)

## 2015-09-25 LAB — HEMOGLOBIN A1C
Hgb A1c MFr Bld: 6.4 % — ABNORMAL HIGH (ref <5.7)
Mean Plasma Glucose: 137 mg/dL

## 2015-09-25 LAB — MAGNESIUM: Magnesium: 1.7 mg/dL (ref 1.5–2.5)

## 2015-09-25 NOTE — Patient Instructions (Signed)

## 2015-09-25 NOTE — Progress Notes (Signed)
Patient ID: [name], male   DOB: [birth year], 70 y.o.   MRN: [id number]  [facility] ADULT & ADOLESCENT INTERNAL MEDICINE   Lucky Cowboy, M.D.    Dyanne Carrel. Steffanie Dunn, P.A.-C      Terri Piedra, P.A.-C   [facility]                [address]                [place], [place]. [address] Telephone [phone] Telefax [phone] _________________________________  Comprehensive Evaluation & Examination     This very nice 70 y.o. MBM presents for a comprehensive evaluation and management of multiple medical co-morbidities.  Patient has been followed for HTN, T2_NIDDM  , Hyperlipidemia and Vitamin D Deficiency. Patient was evaluated by Dr A. Derrell Lolling on 3.23.2017 for a RIH, which he states is becoming larger and more painful & occasionally he has to self reduce for pain control. He relates he is going By Dr Ramirez's office to schedule a f/u.      HTN predates since 2005. Patient's BP has been controlled at home. Today's BP: 130/84 mmHg. In 2009 , patient had a negative cath by Dr Sanjuana Kava. Patient had recurrent Pulm. Emboli and has been on Coumadin since.  Patient denies any cardiac symptoms as chest pain, palpitations, shortness of breath, dizziness or ankle swelling.     Patient's hyperlipidemia is controlled with diet and medications. Patient denies myalgias or other medication SE's. Last lipids were  Cholesterol 167; HDL 47; LDL 101; Triglycerides 96 on 05/29/2015.     Patient has T2_NIDDM since  2002 and he has attempted to control by diet  and patient denies reactive hypoglycemic symptoms, visual blurring, diabetic polys or paresthesias. Last A1c was  6.7% on 05/29/2015.     Finally, patient has history of Vitamin D Deficiency of  "30" in 2013  and last vitamin D was  Still low at 41 on 05/29/2015.   Medication Sig  . acetaminophen 500 MG tablet Take  every 6  hours as needed for pain.  . benazepril  10 MG tablet Take 10 mg by mouth daily.  Marland Kitchen VITAMIN D  TAKING 5,000 UNITS Daily  . ezetimibe  10 MG tablet Take 1 tablet daily for Cholesterol  . fenofibrate  134 MG capsule TAKE 1 CAP   EVERY EVENING.  Marland Kitchen Loratadine 10 MG  Take by mouth.  . magnesium  500 MG tablet Has 250 mg tablet is taking 2 tablets  . NASONEX  nasal spray Place 2 sprays into the nose daily.  Marland Kitchen warfarin5 MG tablet Take as directed by Coumadin Clinic.    Allergies  Allergen Reactions  . Statins     REACTION: muscle aches  . Welchol [Colesevelam Hcl]    Past Medical History  Diagnosis Date  . Pulmonary embolism [facility])     march 2010, 2012              /   DVT x  2  LEFT 2010  . Coronary artery disease     non-obstructive by cath 2009  . Pre-diabetes   . Peripheral vascular disease (HCC)   . Severe mitral regurgitation by prior echocardiogram   . Sleep apnea     STOP BANG SCORE 4  . S/P mitral valve repair 08/15/2012    Complex valvuloplasty including triangular resection of posterior leaflet, artificial Gore-tex neocord placement x4 and Sorin Memo 3D ring annuloplasty via right mini thoracotomy approach  .  Hyperlipidemia   . Hypertension   . Heart murmur   . Seasonal allergies   . Osteoarthritis   . Vitamin D deficiency   . Degenerative joint disease   . Allergy     SEASONAL  . Cataract     SMALL IN LEFT EYE   Health Maintenance  Topic Date Due  . Hepatitis C Screening  12-23-1945  . OPHTHALMOLOGY EXAM  08/21/1956  . ZOSTAVAX  08/22/2006  . PNA vac Low Risk Adult (2 of 2 - PCV13) 05/11/2013  . TETANUS/TDAP  11/21/2013  . INFLUENZA VACCINE  10/21/2015  . HEMOGLOBIN A1C  11/29/2015  . FOOT EXAM  02/18/2016  . COLONOSCOPY  07/11/2024   Immunization History  Administered Date(s) Administered  . Influenza, High Dose Seasonal PF 12/20/2013, 01/14/2015  . Influenza-Unspecified 01/20/2013  . Pneumococcal-Unspecified 05/11/2012  . Td 11/22/2003   Past Surgical History  Procedure Laterality Date  . Achillis tendon      repair 20 years ago  . Hip  arthroplasty      11/06/07  . Total hip arthroplasty  08/24/2011    Procedure: TOTAL HIP ARTHROPLASTY ANTERIOR APPROACH;  Surgeon: Shelda Pal, MD;  Location: [facility] ORS;  Service: Orthopedics;  Laterality: Right;  . Tee without cardioversion N/A 07/04/2012    Procedure: TRANSESOPHAGEAL ECHOCARDIOGRAM (TEE);  Surgeon: Laurey Morale, MD;  Location: [facility] ENDOSCOPY;  Service: Cardiovascular;  Laterality: N/A;  . Cardiac catheterization    . Mitral valve repair Right 08/15/2012    Procedure: MINIMALLY INVASIVE MITRAL VALVE REPAIR (MVR);  Surgeon: Purcell Nails, MD;  Location: [facility] OR;  Service: Open Heart Surgery;  Laterality: Right;  . Intraoperative transesophageal echocardiogram N/A 08/15/2012    Procedure: INTRAOPERATIVE TRANSESOPHAGEAL ECHOCARDIOGRAM;  Surgeon: Purcell Nails, MD;  Location: [facility] OR;  Service: Open Heart Surgery;  Laterality: N/A;  . Colonoscopy     Family History  Problem Relation Age of Onset  . Coronary artery disease    . Heart attack Father   . Heart attack Brother 60  . Hypertension Father   . Hypertension Sister   . Hypertension Brother   . Hyperlipidemia Sister   . Diabetes Sister   . Diabetes Brother    Social History   Social History  . Marital Status: Married    Spouse Name: N/A  . Number of Children: 3  . Years of Education: N/A   Occupational History  . Industrial hygeinist    Social History Main Topics  . Smoking status: Former Smoker    Types: Cigars    Quit date: 03/22/1973  . Smokeless tobacco: Current User    Types: Chew     Comment: uses smokeless tobacco  . Alcohol Use: No  . Drug Use: No  . Sexual Activity: Not Currently    ROS Constitutional: Denies fever, chills, weight loss/gain, headaches, insomnia,  night sweats or change in appetite. Does c/o fatigue. Eyes: Denies redness, blurred vision, diplopia, discharge, itchy or watery eyes.  ENT: Denies discharge, congestion, post nasal drip, epistaxis, sore throat, earache, hearing loss,  dental pain, Tinnitus, Vertigo, Sinus pain or snoring.  Cardio: Denies chest pain, palpitations, irregular heartbeat, syncope, dyspnea, diaphoresis, orthopnea, PND, claudication or edema Respiratory: denies cough, dyspnea, DOE, pleurisy, hoarseness, laryngitis or wheezing.  Gastrointestinal: Denies dysphagia, heartburn, reflux, water brash, pain, cramps, nausea, vomiting, bloating, diarrhea, constipation, hematemesis, melena, hematochezia, jaundice or hemorrhoids Genitourinary: Denies dysuria, frequency, urgency, nocturia, hesitancy, discharge, hematuria or flank pain Musculoskeletal: Denies arthralgia, myalgia, stiffness, Jt. Swelling, pain,  limp or strain/sprain. Denies Falls. Skin: Denies puritis, rash, hives, warts, acne, eczema or change in skin lesion Neuro: No weakness, tremor, incoordination, spasms, paresthesia or pain Psychiatric: Denies confusion, memory loss or sensory loss. Denies Depression. Endocrine: Denies change in weight, skin, hair change, nocturia, and paresthesia, diabetic polys, visual blurring or hyper / hypo glycemic episodes.  Heme/Lymph: No excessive bleeding, bruising or enlarged lymph nodes.  Physical Exam  BP 130/84 mmHg  Pulse 72  Temp(Src) 97.9 F (36.6 C)  Resp 16  Ht 6' (1.829 m)  Wt 223 lb 9.6 oz (101.424 kg)  BMI 30.32 kg/m2  General Appearance: Well nourished, in no apparent distress. Eyes: PERRLA, EOMs, conjunctiva no swelling or erythema, normal fundi and vessels. Sinuses: No frontal/maxillary tenderness ENT/Mouth: EACs patent / TMs  nl. Nares clear without erythema, swelling, mucoid exudates. Oral hygiene is good. No erythema, swelling, or exudate. Tongue normal, non-obstructing. Tonsils not swollen or erythematous. Hearing normal.  Neck: Supple, thyroid normal. No bruits, nodes or JVD. Respiratory: Respiratory effort normal.  BS equal and clear bilateral without rales, rhonci, wheezing or stridor. Cardio: Heart sounds are normal with regular rate  and rhythm and no murmurs, rubs or gallops. Peripheral pulses are normal and equal bilaterally without edema. No aortic or femoral bruits. Chest: symmetric with normal excursions and percussion.  Abdomen: Soft, with Nl bowel sounds. Nontender, no guarding, rebound, hernias, masses, or organomegaly.  Lymphatics: Non tender without lymphadenopathy.  Genitourinary: Moderate sized RIH which isTestes nl. DRE - prostate nl for age - smooth & firm w/o nodules. Musculoskeletal: Full ROM all peripheral extremities, joint stability, 5/5 strength, and normal gait. Skin: Warm and dry without rashes, lesions, cyanosis, clubbing or  ecchymosis.  Neuro: Cranial nerves intact, reflexes equal bilaterally. Normal muscle tone, no cerebellar symptoms. Sensation intact to touch, Vibratory and Monofilament to the toes bilaterally.  Pysch: Alert and oriented X 3 with normal affect, insight and judgment appropriate.   Assessment and Plan  1. Annual Preventative/Screening Exam    - EKG 12-Lead - [place], RETROPERITNL ABD,  LTD - TSH  2. Hyperlipidemia  - Lipid panel - TSH  3. Type 2 diabetes mellitus with stage 3 chronic kidney disease, without long-term current use of insulin (HCC)  - Microalbumin / creatinine urine ratio - HM DIABETES FOOT EXAM - LOW EXTREMITY NEUR EXAM DOCUM - Hemoglobin A1c - Insulin, random  4. Vitamin D deficiency  - VITAMIN D 25 Hydroxy   5. Obesity   6. S/P mitral valve repair   7. Screening for rectal cancer  - POC Hemoccult Bld/Stl   8. Prostate cancer screening  - PSA  9. Medication management  - Urinalysis, Routine w reflex microscopic  - CBC with Differential/Platelet - BASIC METABOLIC PANEL WITH GFR - Hepatic function panel - Magnesium  10. Screening for ischemic heart disease   11. Screening for AAA (aortic abdominal aneurysm)   12. RIH,  Symptomatic   Continue prudent diet as discussed, weight control, BP monitoring, regular exercise, and  medications as discussed.  Discussed med effects and SE's. Routine screening labs and tests as requested with regular follow-up as recommended. Over 40 minutes of exam, counseling, chart review and high complex critical decision making was performed

## 2015-09-26 LAB — URINALYSIS, ROUTINE W REFLEX MICROSCOPIC
Bilirubin Urine: NEGATIVE
GLUCOSE, UA: NEGATIVE
Hgb urine dipstick: NEGATIVE
Ketones, ur: NEGATIVE
LEUKOCYTES UA: NEGATIVE
Nitrite: NEGATIVE
PH: 5 (ref 5.0–8.0)
Protein, ur: NEGATIVE
SPECIFIC GRAVITY, URINE: 1.018 (ref 1.001–1.035)

## 2015-09-26 LAB — TSH: TSH: 1 m[IU]/L (ref 0.40–4.50)

## 2015-09-26 LAB — MICROALBUMIN / CREATININE URINE RATIO
Creatinine, Urine: 136 mg/dL (ref 20–370)
Microalb Creat Ratio: 36 mcg/mg creat — ABNORMAL HIGH (ref <30)
Microalb, Ur: 4.9 mg/dL

## 2015-09-26 LAB — INSULIN, RANDOM: INSULIN: 17.5 u[IU]/mL (ref 2.0–19.6)

## 2015-09-26 LAB — PSA: PSA: 0.56 ng/mL (ref <=4.00)

## 2015-09-26 LAB — VITAMIN D 25 HYDROXY (VIT D DEFICIENCY, FRACTURES): Vit D, 25-Hydroxy: 51 ng/mL (ref 30–100)

## 2015-10-06 ENCOUNTER — Other Ambulatory Visit: Payer: Self-pay

## 2015-10-06 DIAGNOSIS — Z1212 Encounter for screening for malignant neoplasm of rectum: Secondary | ICD-10-CM

## 2015-10-06 LAB — POC HEMOCCULT BLD/STL (HOME/3-CARD/SCREEN)
Card #2 Fecal Occult Blod, POC: NEGATIVE
FECAL OCCULT BLD: NEGATIVE
Fecal Occult Blood, POC: NEGATIVE

## 2015-10-07 MED FILL — BENAZEPRIL HCL 20 MG TABLET: 20 | 90 days supply | Qty: 90 | Fill #1

## 2015-10-13 ENCOUNTER — Ambulatory Visit: Payer: Self-pay | Admitting: General Surgery

## 2015-10-13 DIAGNOSIS — K4090 Unilateral inguinal hernia, without obstruction or gangrene, not specified as recurrent: Secondary | ICD-10-CM | POA: Diagnosis not present

## 2015-10-13 NOTE — H&P (Signed)
History of Present Illness Marcus Filler MD; 10/13/2015 3:37 PM) The patient is a 70 year old male who presents with an inguinal hernia. The patient is a 70 year old male who presented been seen secondary to right inguinal hernia. Patient states that the hernia is continue to bother him. He states that it has gotten minimally larger. He's had no signs or symptoms of incarceration or granulation.  Patient has seen Dr. Camillo Flaming recently and feels was in good health per the patient.   Allergies (Sonya Bynum, CMA; 10/13/2015 3:22 PM) Statins Depletion *DIETARY PRODUCTS/[facility]ETARY MANAGEMENT PRODUCT[facility]  Medication History Lamar Laundry Bynum, CMA; 10/13/2015 3:22 PM) Ezetimibe (10MG  Tablet, Oral) Active. Fenofibrate Micronized (134MG  Capsule, Oral) Active. Warfarin Sodium (5MG  Tablet, Oral) Active. Tylenol Extra Strength (500MG  Tablet, Oral) Active. Lotensin (20MG  Tablet, Oral) Active. Vitamin D3 Active. Loratadine (10MG  Capsule, Oral) Active. Nasonex (50MCG/ACT Suspension, Nasal) Active. Medications Reconciled    Review of Systems Marcus Filler, MD; 10/13/2015 3:38 PM) General Not Present- Fever. Respiratory Not Present- Cough and Difficulty Breathing. Cardiovascular Not Present- Chest Pain. Gastrointestinal Present- Abdominal Pain. Musculoskeletal Not Present- Myalgia. Neurological Not Present- Weakness.  Vitals (Sonya Bynum CMA; 10/13/2015 3:22 PM) 10/13/2015 3:22 PM Weight: 224 lb Height: 72in Body Surface Area: 2.24 m Body Mass Index: 30.38 kg/m  Temp.: 97.72F(Temporal)  Pulse: 76 (Regular)  BP: 134/74 (Sitting, Left Arm, Standard)       Physical Exam Marcus Filler MD; 10/13/2015 3:37 PM) General Mental Status-Alert. General Appearance-Consistent with stated age. Hydration-Well hydrated. Voice-Normal.  Head and Neck Head-normocephalic, atraumatic with no lesions or palpable masses. Trachea-midline.  Eye Eyeball -  Bilateral-Extraocular movements intact. Sclera/Conjunctiva - Bilateral-No scleral icterus.  Chest and Lung Exam Chest and lung exam reveals -quiet, even and easy respiratory effort with no use of accessory muscles. Inspection Chest Wall - Normal. Back - normal.  Cardiovascular Cardiovascular examination reveals -normal heart sounds, regular rate and rhythm with no murmurs.  Abdomen Inspection Skin - Scar - no surgical scars. Hernias - Inguinal hernia - Right - Reducible(Large likely direct). Palpation/Percussion Normal exam - Soft, Non Tender, No Rebound tenderness, No Rigidity (guarding) and No hepatosplenomegaly. Auscultation Normal exam - Bowel sounds normal.  Neurologic Neurologic evaluation reveals -alert and oriented x 3 with no impairment of recent or remote memory. Mental Status-Normal.  Musculoskeletal Normal Exam - Left-Upper Extremity Strength Normal and Lower Extremity Strength Normal. Normal Exam - Right-Upper Extremity Strength Normal, Lower Extremity Weakness.    Assessment & Plan Marcus Filler MD; 10/13/2015 3:38 PM) RIGHT INGUINAL HERNIA (K40.90) Impression: 70 year old male with a right inguinal hernia, likely direct  1. The patient will like to proceed to the operating room for laparoscopic right inguinal hernia repair with mesh.  2. I discussed with the patient the signs and symptoms of incarceration and strangulation and the need to proceed to the ER should they occur.  3. I discussed with the patient the risks and benefits of the procedure to include but not limited to: Infection, bleeding, damage to surrounding structures, possible need for further surgery, possible nerve pain, and possible recurrence. The patient was understanding and wishes to proceed. 4. The patient will require cardiac clearance by Dr. Camillo Flaming. Patient will also need to be off Coumadin. We'll get that cleared by the Coumadin clinic.

## 2015-10-14 ENCOUNTER — Other Ambulatory Visit: Payer: Self-pay | Admitting: Internal Medicine

## 2015-10-14 DIAGNOSIS — E782 Mixed hyperlipidemia: Secondary | ICD-10-CM

## 2015-10-14 MED FILL — EZETIMIBE 10 MG TABLET: 10 | 90 days supply | Qty: 90 | Fill #0

## 2015-10-20 ENCOUNTER — Telehealth: Payer: Self-pay | Admitting: *Deleted

## 2015-10-20 ENCOUNTER — Ambulatory Visit (INDEPENDENT_AMBULATORY_CARE_PROVIDER_SITE_OTHER): Payer: 59 | Admitting: *Deleted

## 2015-10-20 ENCOUNTER — Encounter: Payer: Self-pay | Admitting: Cardiovascular Disease

## 2015-10-20 DIAGNOSIS — I2699 Other pulmonary embolism without acute cor pulmonale: Secondary | ICD-10-CM

## 2015-10-20 DIAGNOSIS — Z9889 Other specified postprocedural states: Secondary | ICD-10-CM

## 2015-10-20 DIAGNOSIS — Z7901 Long term (current) use of anticoagulants: Secondary | ICD-10-CM | POA: Diagnosis not present

## 2015-10-20 LAB — POCT INR: INR: 2.7

## 2015-10-20 NOTE — Telephone Encounter (Signed)
Dr. Clifton James has written letter clearing pt from a cardiac standpoint for surgery. Letter given to medical records to fax to [facility]. I spoke with coumadin clinic and pt should contact them as soon as surgery scheduled with planned date.  Lovenox bridging will then be arranged. I spoke with pt and told him to contact our coumadin clinic once date of surgery known.

## 2015-10-31 ENCOUNTER — Ambulatory Visit (INDEPENDENT_AMBULATORY_CARE_PROVIDER_SITE_OTHER): Payer: 59

## 2015-10-31 DIAGNOSIS — I2699 Other pulmonary embolism without acute cor pulmonale: Secondary | ICD-10-CM | POA: Diagnosis not present

## 2015-10-31 DIAGNOSIS — Z7901 Long term (current) use of anticoagulants: Secondary | ICD-10-CM

## 2015-10-31 DIAGNOSIS — Z9889 Other specified postprocedural states: Secondary | ICD-10-CM | POA: Diagnosis not present

## 2015-10-31 LAB — POCT INR: INR: 2.7

## 2015-10-31 MED ORDER — ENOXAPARIN SODIUM 150 MG/ML ~~LOC~~ SOLN: 150.0000 mg | SUBCUTANEOUS | 0 refills | Status: DC

## 2015-10-31 MED FILL — ENOXAPARIN 150 MG/ML SYRN: 150 | 10 days supply | Qty: 10 | Fill #0

## 2015-10-31 NOTE — Patient Instructions (Addendum)
11/01/15: Last dose of Coumadin  11/02/15: No coumadin or Lovenox   11/03/15: Inject Lovenox 150mg  in the fatty abdominal tissue at least 2 inches from the belly button once a day  8am rotate sites. No Coumadin  11/04/15: Inject Lovenox in the fatty tissue once daily 8am. No Coumadin  11/05/15: Inject Lovenox in the fatty tissue once daily 8am. No Coumadin  11/06/15: No Lovenox, No Coumadin  11/07/15: Procedure Day - No Lovenox - Resume Coumadin in the evening or as directed by doctor (take an extra half tablet with usual dose for 2 days then resume normal dose)  11/08/15: Resume Lovenox inject in the fatty tissue once daily and take coumadin   11/09/15: Inject Lovenox in the fatty tissue once daily 8am and take coumadin  11/10/15: Inject Lovenox in the fatty tissue once daily 8am and take coumadin  11/11/15: Inject Lovenox in the fatty tissue once daily 8am and take coumadin  11/12/15: Inject Lovenox in the fatty tissue once daily 8am and take coumadin  11/13/15: Coumadin appt to check INR

## 2015-11-04 ENCOUNTER — Encounter (HOSPITAL_COMMUNITY): Payer: Self-pay

## 2015-11-04 ENCOUNTER — Encounter (HOSPITAL_COMMUNITY)
Admission: RE | Admit: 2015-11-04 | Discharge: 2015-11-04 | Disposition: A | Discharge status: Home / Self Care | Payer: 59 | Source: Ambulatory Visit | Attending: General Surgery | Admitting: General Surgery

## 2015-11-04 DIAGNOSIS — Z79899 Other long term (current) drug therapy: Secondary | ICD-10-CM | POA: Diagnosis not present

## 2015-11-04 DIAGNOSIS — R7303 Prediabetes: Secondary | ICD-10-CM | POA: Diagnosis not present

## 2015-11-04 DIAGNOSIS — K4090 Unilateral inguinal hernia, without obstruction or gangrene, not specified as recurrent: Secondary | ICD-10-CM | POA: Diagnosis not present

## 2015-11-04 DIAGNOSIS — Z01818 Encounter for other preprocedural examination: Secondary | ICD-10-CM | POA: Insufficient documentation

## 2015-11-04 DIAGNOSIS — Z01812 Encounter for preprocedural laboratory examination: Secondary | ICD-10-CM | POA: Diagnosis not present

## 2015-11-04 DIAGNOSIS — Z7901 Long term (current) use of anticoagulants: Secondary | ICD-10-CM | POA: Insufficient documentation

## 2015-11-04 DIAGNOSIS — M1990 Unspecified osteoarthritis, unspecified site: Secondary | ICD-10-CM | POA: Diagnosis not present

## 2015-11-04 DIAGNOSIS — Z87891 Personal history of nicotine dependence: Secondary | ICD-10-CM | POA: Insufficient documentation

## 2015-11-04 DIAGNOSIS — Z86711 Personal history of pulmonary embolism: Secondary | ICD-10-CM | POA: Diagnosis not present

## 2015-11-04 DIAGNOSIS — I2510 Atherosclerotic heart disease of native coronary artery without angina pectoris: Secondary | ICD-10-CM | POA: Diagnosis not present

## 2015-11-04 DIAGNOSIS — E785 Hyperlipidemia, unspecified: Secondary | ICD-10-CM | POA: Diagnosis not present

## 2015-11-04 DIAGNOSIS — I10 Essential (primary) hypertension: Secondary | ICD-10-CM | POA: Insufficient documentation

## 2015-11-04 HISTORY — DX: Presence of spectacles and contact lenses: Z97.3

## 2015-11-04 HISTORY — DX: Unilateral inguinal hernia, without obstruction or gangrene, not specified as recurrent: K40.90

## 2015-11-04 LAB — BASIC METABOLIC PANEL
ANION GAP: 9 (ref 5–15)
BUN: 21 mg/dL — ABNORMAL HIGH (ref 6–20)
CALCIUM: 9.6 mg/dL (ref 8.9–10.3)
CO2: 23 mmol/L (ref 22–32)
Chloride: 106 mmol/L (ref 101–111)
Creatinine, Ser: 1.48 mg/dL — ABNORMAL HIGH (ref 0.61–1.24)
GFR, EST AFRICAN AMERICAN: 54 mL/min — AB (ref >60)
GFR, EST NON AFRICAN AMERICAN: 47 mL/min — AB (ref >60)
Glucose, Bld: 96 mg/dL (ref 65–99)
Potassium: 4.1 mmol/L (ref 3.5–5.1)
SODIUM: 138 mmol/L (ref 135–145)

## 2015-11-04 LAB — CBC
HCT: 46.2 % (ref 39.0–52.0)
HEMOGLOBIN: 15.5 g/dL (ref 13.0–17.0)
MCH: 30.6 pg (ref 26.0–34.0)
MCHC: 33.5 g/dL (ref 30.0–36.0)
MCV: 91.1 fL (ref 78.0–100.0)
PLATELETS: 226 10*3/uL (ref 150–400)
RBC: 5.07 MIL/uL (ref 4.22–5.81)
RDW: 13.5 % (ref 11.5–15.5)
WBC: 5 10*3/uL (ref 4.0–10.5)

## 2015-11-04 LAB — GLUCOSE, CAPILLARY: Glucose-Capillary: 129 mg/dL — ABNORMAL HIGH (ref 65–99)

## 2015-11-04 NOTE — Progress Notes (Signed)
Pt denies SOB and chest pain but is under the care of Dr. Clifton James, Cardiology. Pt denies having a stress test. Pt educated on the importance of abstaining from the use of tobacco and the consequences of continued use both pre and post operatively. Pt stated that his last dose of Coumadin was 11/01/15 and Lovenox injections were started on 8/14 as instructed. Pt  stated that he rarely checks his blood glucose, pt stated " my wife may check it with her machine." Pt stated that his pre-diabetes is diet controlled. Pt chart forwarded to anesthesia to review cardiac history and cardiac clearance note documented in Epic.

## 2015-11-04 NOTE — Progress Notes (Signed)
Pt denies having a chest x ray within the last year and stated that his last labs were recently drawn at the Coumadin Clinic ( results in Epic).

## 2015-11-04 NOTE — Pre-Procedure Instructions (Signed)
JAQUILLE STICKLE  11/04/2015      [name] - [place], [place] - [address] [facility] [place] [address] Phone: [phone] Fax: [phone]    Your procedure is scheduled on Friday, November 07, 2015  Report to [facility] Admitting at 5:30 A.M.  Call this number if you have problems the morning of surgery:  [phone]   Remember:  Do not eat food or drink liquids after midnight Thursday, November 06, 2015  Take these medicines the morning of surgery with A SIP OF WATER : if needed: Tylenol for pain, Claritin for allergies, Nasonex nasal spray for allergies Stop taking Aspirin, Coumadin, vitamins, fish oil and herbal medications. Do not take any NSAIDs ie: Ibuprofen, Advil, Naproxen, BC and Goody Powder or any medication containing Aspirin; stop now.  Do not wear jewelry, make-up or nail polish.  Do not wear lotions, powders, or perfumes.  You may not wear deoderant.  Do not shave 48 hours prior to surgery.  Men may shave face and neck.  Do not bring valuables to the hospital.  [facility] is not responsible for any belongings or valuables.  Contacts, dentures or bridgework may not be worn into surgery.  Leave your suitcase in the car.  After surgery it may be brought to your room.  For patients admitted to the hospital, discharge time will be determined by your treatment team.  Patients discharged the day of surgery will not be allowed to drive home.   Name and phone number of your driver:  Special instructions:  [place] - Preparing for Surgery  Before surgery, you can play an important role.  Because skin is not sterile, your skin needs to be as free of germs as possible.  You can reduce the number of germs on you skin by washing with CHG (chlorahexidine gluconate) soap before surgery.  CHG is an antiseptic cleaner which kills germs and bonds with the skin to continue killing germs even after washing.  Please DO NOT  use if you have an allergy to CHG or antibacterial soaps.  If your skin becomes reddened/irritated stop using the CHG and inform your nurse when you arrive at Short Stay.  Do not shave (including legs and underarms) for at least 48 hours prior to the first CHG shower.  You may shave your face.  Please follow these instructions carefully:   1.  Shower with CHG Soap the night before surgery and the morning of Surgery.  2.  If you choose to wash your hair, wash your hair first as usual with your normal shampoo.  3.  After you shampoo, rinse your hair and body thoroughly to remove the Shampoo.  4.  Use CHG as you would any other liquid soap.  You can apply chg directly  to the skin and wash gently with scrungie or a clean washcloth.  5.  Apply the CHG Soap to your body ONLY FROM THE NECK DOWN.  Do not use on open wounds or open sores.  Avoid contact with your eyes, ears, mouth and genitals (private parts).  Wash genitals (private parts) with your normal soap.  6.  Wash thoroughly, paying special attention to the area where your surgery will be performed.  7.  Thoroughly rinse your body with warm water from the neck down.  8.  DO NOT shower/wash with your normal soap after using and rinsing off the CHG Soap.  9.  Pat yourself dry  with a clean towel.            10.  Wear clean pajamas.            11.  Place clean sheets on your bed the night of your first shower and do not sleep with pets.  Day of Surgery  Do not apply any lotions/deodorants the morning of surgery.  Please wear clean clothes to the hospital/surgery center.  Please read over the following fact sheets that you were given. Pain Booklet, Coughing and Deep Breathing and Surgical Site Infection Prevention

## 2015-11-04 NOTE — Progress Notes (Signed)
   11/04/15 0933  OBSTRUCTIVE SLEEP APNEA  Have you ever been diagnosed with sleep apnea through a sleep study? No  Do you snore loudly (loud enough to be heard through closed doors)?  1  Do you often feel tired, fatigued, or sleepy during the daytime (such as falling asleep during driving or talking to someone)? 0  Has anyone observed you stop breathing during your sleep? 0  Do you have, or are you being treated for high blood pressure? 1  BMI more than 35 kg/m2? 0  Age > 50 (1-yes) 1  Neck circumference greater than:Male 16 inches or larger, Male 17inches or larger? 1  Male Gender (Yes=1) 1  Obstructive Sleep Apnea Score 5

## 2015-11-05 NOTE — Progress Notes (Signed)
Anesthesia Chart Review: Patient is a 70 year old male scheduled for laparoscopic right inguinal hernia repair on 11/07/15 by Dr. Derrell Lolling.  History includes severe MR s/p minimally invasive MV repair 08/15/12 (Dr. Cornelius Moras), former smoker, HTN, HLD, PE '10 and '12 (long-term anticoagulation), OA, mild non-obstructive CAD by 06/2012 cath, pre-DM2. OSA screening score is 5.   PCP is Dr. Lucky Cowboy, last visit 09/25/15.   Cardiologist is Dr. Clifton James, last visit 06/13/15. 12 month follow-up recommended. He cleared patient for surgery with recommendation for a Lovenox bridge. (See 10/20/15 telephone encounter by P. Meryl Crutch, RN.)  Meds include benazepril, Zetia, Lofibra, Nasonex, warfarin (last dose 11/01/15). He is starting Lovenox bridge on 11/03/15.   BP (!) 141/79   Pulse 69   Temp 36.9 C   Resp 20   Ht 6' (1.829 m)   SpO2 98%   09/25/15 EKG: NSR with sinus arrhytmia, LAD, occasional PVC..  08/13/14 Echo: Study Conclusions - Left ventricle: The cavity size was moderately dilated. Wall thickness was normal. Systolic function was moderately reduced. The estimated ejection fraction was in the range of 35% to 40%. Diffuse hypokinesis. - Mitral valve: Intact MV repair with annuloplasty ring and trivial residual MR. - Left atrium: The atrium was mildly dilated. - Atrial septum: No defect or patent foramen ovale was identified. (Previous EF 50-55% 09/11/12.)  07/14/12 (PRE-MV REPAIR) Cardiac cath: Mild non-obstructive CAD (40% proximal D1, 30% OM2), LVEF 55%, severe MR.  11/29/12-9/430/14 Event monitor: SR with PVCs.  08/10/12 Carotid duplex: No significant extracranial carotid artery stenosis demonstrated.  Antegrade vertebral flow.    Preoperative labs noted. Cr 1.48, stable. CBC WNL. A1c 6.4 on 09/25/15. Will order a STAT PT/PTT for the morning of surgery.  If labs are acceptable and no acute changes then I anticipate he can proceed as planned.  Velna Ochs [facility] Short Stay  Center/Anesthesiology Phone [phone] 11/05/2015 9:37 AM

## 2015-11-07 ENCOUNTER — Ambulatory Visit (HOSPITAL_COMMUNITY): Payer: 59 | Admitting: Vascular Surgery

## 2015-11-07 ENCOUNTER — Encounter (HOSPITAL_COMMUNITY)
Admission: RE | Disposition: A | Discharge status: Home / Self Care | Payer: Self-pay | Source: Ambulatory Visit | Attending: General Surgery

## 2015-11-07 ENCOUNTER — Ambulatory Visit (HOSPITAL_COMMUNITY)
Admission: RE | Admit: 2015-11-07 | Discharge: 2015-11-07 | Disposition: A | Discharge status: Home / Self Care | Payer: 59 | Source: Ambulatory Visit | Attending: General Surgery | Admitting: General Surgery

## 2015-11-07 ENCOUNTER — Encounter (HOSPITAL_COMMUNITY): Payer: Self-pay | Admitting: *Deleted

## 2015-11-07 DIAGNOSIS — Z86711 Personal history of pulmonary embolism: Secondary | ICD-10-CM | POA: Insufficient documentation

## 2015-11-07 DIAGNOSIS — Z7901 Long term (current) use of anticoagulants: Secondary | ICD-10-CM | POA: Insufficient documentation

## 2015-11-07 DIAGNOSIS — K4090 Unilateral inguinal hernia, without obstruction or gangrene, not specified as recurrent: Secondary | ICD-10-CM | POA: Diagnosis not present

## 2015-11-07 DIAGNOSIS — Z87891 Personal history of nicotine dependence: Secondary | ICD-10-CM | POA: Diagnosis not present

## 2015-11-07 DIAGNOSIS — I509 Heart failure, unspecified: Secondary | ICD-10-CM | POA: Diagnosis not present

## 2015-11-07 DIAGNOSIS — I110 Hypertensive heart disease with heart failure: Secondary | ICD-10-CM | POA: Diagnosis not present

## 2015-11-07 DIAGNOSIS — Z6830 Body mass index (BMI) 30.0-30.9, adult: Secondary | ICD-10-CM | POA: Diagnosis not present

## 2015-11-07 DIAGNOSIS — I429 Cardiomyopathy, unspecified: Secondary | ICD-10-CM | POA: Diagnosis not present

## 2015-11-07 DIAGNOSIS — E119 Type 2 diabetes mellitus without complications: Secondary | ICD-10-CM | POA: Diagnosis not present

## 2015-11-07 DIAGNOSIS — Z79899 Other long term (current) drug therapy: Secondary | ICD-10-CM | POA: Insufficient documentation

## 2015-11-07 DIAGNOSIS — E669 Obesity, unspecified: Secondary | ICD-10-CM | POA: Insufficient documentation

## 2015-11-07 DIAGNOSIS — I2510 Atherosclerotic heart disease of native coronary artery without angina pectoris: Secondary | ICD-10-CM | POA: Diagnosis not present

## 2015-11-07 HISTORY — PX: INSERTION OF MESH: SHX5868

## 2015-11-07 HISTORY — PX: INGUINAL HERNIA REPAIR: SHX194

## 2015-11-07 LAB — PROTIME-INR
INR: 1.2
PROTHROMBIN TIME: 15.3 s — AB (ref 11.4–15.2)

## 2015-11-07 LAB — APTT: aPTT: 27 seconds (ref 24–36)

## 2015-11-07 LAB — GLUCOSE, CAPILLARY: GLUCOSE-CAPILLARY: 114 mg/dL — AB (ref 65–99)

## 2015-11-07 SURGERY — REPAIR, HERNIA, INGUINAL, LAPAROSCOPIC
Anesthesia: General | Laterality: Right

## 2015-11-07 MED ORDER — FENTANYL CITRATE (PF) 100 MCG/2ML IJ SOLN
INTRAMUSCULAR | Status: AC
  Administered 2015-11-07: 50 ug via INTRAVENOUS
  Filled 2015-11-07: qty 2

## 2015-11-07 MED ORDER — 0.9 % SODIUM CHLORIDE (POUR BTL) OPTIME
TOPICAL | Status: DC | PRN
  Administered 2015-11-07: 1000 mL

## 2015-11-07 MED ORDER — BUPIVACAINE HCL (PF) 0.25 % IJ SOLN
INTRAMUSCULAR | Status: AC
  Filled 2015-11-07: qty 30

## 2015-11-07 MED ORDER — LACTATED RINGERS IV SOLN
INTRAVENOUS | Status: DC | PRN
  Administered 2015-11-07: 07:00:00 via INTRAVENOUS

## 2015-11-07 MED ORDER — MIDAZOLAM HCL 2 MG/2ML IJ SOLN
INTRAMUSCULAR | Status: AC
  Filled 2015-11-07: qty 2

## 2015-11-07 MED ORDER — SODIUM CHLORIDE 0.9 % IV SOLN: 250.0000 mL | INTRAVENOUS | Status: DC | PRN

## 2015-11-07 MED ORDER — SUGAMMADEX SODIUM 200 MG/2ML IV SOLN
INTRAVENOUS | Status: DC | PRN
  Administered 2015-11-07: 200 mg via INTRAVENOUS

## 2015-11-07 MED ORDER — OXYCODONE HCL 5 MG/5ML PO SOLN: 5.0000 mg | Freq: Once | ORAL | Status: DC | PRN

## 2015-11-07 MED ORDER — SUGAMMADEX SODIUM 200 MG/2ML IV SOLN
INTRAVENOUS | Status: AC
  Filled 2015-11-07: qty 2

## 2015-11-07 MED ORDER — SODIUM CHLORIDE 0.9% FLUSH: 3.0000 mL | INTRAVENOUS | Status: DC | PRN

## 2015-11-07 MED ORDER — CHLORHEXIDINE GLUCONATE CLOTH 2 % EX PADS: 6.0000 | MEDICATED_PAD | Freq: Once | CUTANEOUS | Status: DC

## 2015-11-07 MED ORDER — OXYCODONE HCL 5 MG PO TABS
5.0000 mg | ORAL_TABLET | ORAL | Status: DC | PRN
  Administered 2015-11-07: 10 mg via ORAL

## 2015-11-07 MED ORDER — ONDANSETRON HCL 4 MG/2ML IJ SOLN
INTRAMUSCULAR | Status: DC | PRN
  Administered 2015-11-07: 4 mg via INTRAVENOUS

## 2015-11-07 MED ORDER — ACETAMINOPHEN 650 MG RE SUPP
650.0000 mg | RECTAL | Status: DC | PRN
  Filled 2015-11-07: qty 1

## 2015-11-07 MED ORDER — PROPOFOL 10 MG/ML IV BOLUS
INTRAVENOUS | Status: DC | PRN
  Administered 2015-11-07: 20 mg via INTRAVENOUS
  Administered 2015-11-07: 10 mg via INTRAVENOUS
  Administered 2015-11-07: 110 mg via INTRAVENOUS

## 2015-11-07 MED ORDER — FENTANYL CITRATE (PF) 100 MCG/2ML IJ SOLN
INTRAMUSCULAR | Status: AC
  Filled 2015-11-07: qty 2

## 2015-11-07 MED ORDER — LIDOCAINE 2% (20 MG/ML) 5 ML SYRINGE
INTRAMUSCULAR | Status: AC
  Filled 2015-11-07: qty 5

## 2015-11-07 MED ORDER — OXYCODONE HCL 5 MG PO TABS: 5.0000 mg | ORAL_TABLET | Freq: Once | ORAL | Status: DC | PRN

## 2015-11-07 MED ORDER — MORPHINE SULFATE (PF) 2 MG/ML IV SOLN: 2.0000 mg | INTRAVENOUS | Status: DC | PRN

## 2015-11-07 MED ORDER — OXYCODONE-ACETAMINOPHEN 5-325 MG PO TABS: 1.0000 | ORAL_TABLET | ORAL | 0 refills | Status: DC | PRN

## 2015-11-07 MED ORDER — ACETAMINOPHEN 325 MG PO TABS
ORAL_TABLET | ORAL | Status: AC
  Filled 2015-11-07: qty 2

## 2015-11-07 MED ORDER — FENTANYL CITRATE (PF) 100 MCG/2ML IJ SOLN
25.0000 ug | INTRAMUSCULAR | Status: DC | PRN
  Administered 2015-11-07 (×2): 50 ug via INTRAVENOUS

## 2015-11-07 MED ORDER — ROCURONIUM BROMIDE 100 MG/10ML IV SOLN
INTRAVENOUS | Status: DC | PRN
  Administered 2015-11-07: 60 mg via INTRAVENOUS

## 2015-11-07 MED ORDER — PHENYLEPHRINE HCL 10 MG/ML IJ SOLN
INTRAVENOUS | Status: DC | PRN
  Administered 2015-11-07: 25 ug/min via INTRAVENOUS

## 2015-11-07 MED ORDER — ACETAMINOPHEN 325 MG PO TABS: 325.0000 mg | ORAL_TABLET | ORAL | Status: DC | PRN

## 2015-11-07 MED ORDER — ONDANSETRON HCL 4 MG/2ML IJ SOLN
INTRAMUSCULAR | Status: AC
  Filled 2015-11-07: qty 2

## 2015-11-07 MED ORDER — ARTIFICIAL TEARS OP OINT
TOPICAL_OINTMENT | OPHTHALMIC | Status: AC
  Filled 2015-11-07: qty 3.5

## 2015-11-07 MED ORDER — BUPIVACAINE HCL 0.25 % IJ SOLN
INTRAMUSCULAR | Status: DC | PRN
  Administered 2015-11-07: 4 mL

## 2015-11-07 MED ORDER — ACETAMINOPHEN 325 MG PO TABS
650.0000 mg | ORAL_TABLET | ORAL | Status: DC | PRN
  Administered 2015-11-07: 650 mg via ORAL
  Filled 2015-11-07: qty 2

## 2015-11-07 MED ORDER — OXYCODONE HCL 5 MG PO TABS
ORAL_TABLET | ORAL | Status: AC
  Filled 2015-11-07: qty 2

## 2015-11-07 MED ORDER — ACETAMINOPHEN 160 MG/5ML PO SOLN
325.0000 mg | ORAL | Status: DC | PRN
  Filled 2015-11-07: qty 20.3

## 2015-11-07 MED ORDER — FENTANYL CITRATE (PF) 100 MCG/2ML IJ SOLN
INTRAMUSCULAR | Status: DC | PRN
  Administered 2015-11-07: 25 ug via INTRAVENOUS
  Administered 2015-11-07 (×3): 50 ug via INTRAVENOUS
  Administered 2015-11-07: 25 ug via INTRAVENOUS

## 2015-11-07 MED ORDER — CEFAZOLIN SODIUM-DEXTROSE 2-4 GM/100ML-% IV SOLN
2.0000 g | INTRAVENOUS | Status: AC
  Administered 2015-11-07: 2 g via INTRAVENOUS
  Filled 2015-11-07: qty 100

## 2015-11-07 MED ORDER — SODIUM CHLORIDE 0.9% FLUSH: 3.0000 mL | Freq: Two times a day (BID) | INTRAVENOUS | Status: DC

## 2015-11-07 MED ORDER — SODIUM CHLORIDE 0.9 % IJ SOLN
INTRAMUSCULAR | Status: AC
  Filled 2015-11-07: qty 10

## 2015-11-07 MED ORDER — EPHEDRINE SULFATE 50 MG/ML IJ SOLN
INTRAMUSCULAR | Status: AC
  Filled 2015-11-07: qty 1

## 2015-11-07 MED ORDER — PROPOFOL 10 MG/ML IV BOLUS
INTRAVENOUS | Status: AC
  Filled 2015-11-07: qty 40

## 2015-11-07 MED ORDER — ROCURONIUM BROMIDE 10 MG/ML (PF) SYRINGE
PREFILLED_SYRINGE | INTRAVENOUS | Status: AC
  Filled 2015-11-07: qty 10

## 2015-11-07 MED FILL — OXYCODONE/APAP 5-325: 5-325 | 3 days supply | Qty: 30 | Fill #0

## 2015-11-07 SURGICAL SUPPLY — 43 items
APPLIER CLIP 5 13 M/L LIGAMAX5 (MISCELLANEOUS)
BENZOIN TINCTURE PRP APPL 2/3 (GAUZE/BANDAGES/DRESSINGS) ×3 IMPLANT
CANISTER SUCTION 2500CC (MISCELLANEOUS) IMPLANT
CHLORAPREP W/TINT 26ML (MISCELLANEOUS) ×3 IMPLANT
CLIP APPLIE 5 13 M/L LIGAMAX5 (MISCELLANEOUS) IMPLANT
CLOSURE WOUND 1/2 X4 (GAUZE/BANDAGES/DRESSINGS) ×1
COVER SURGICAL LIGHT HANDLE (MISCELLANEOUS) ×3 IMPLANT
DISSECTOR BLUNT TIP ENDO 5MM (MISCELLANEOUS) IMPLANT
ELECT REM PT RETURN 9FT ADLT (ELECTROSURGICAL) ×3
ELECTRODE REM PT RTRN 9FT ADLT (ELECTROSURGICAL) ×1 IMPLANT
GAUZE SPONGE 2X2 8PLY STRL LF (GAUZE/BANDAGES/DRESSINGS) ×1 IMPLANT
GLOVE BIO SURGEON STRL SZ7.5 (GLOVE) ×3 IMPLANT
GLOVE ECLIPSE 7.0 STRL STRAW (GLOVE) ×3 IMPLANT
GOWN STRL REUS W/ TWL LRG LVL3 (GOWN DISPOSABLE) ×2 IMPLANT
GOWN STRL REUS W/ TWL XL LVL3 (GOWN DISPOSABLE) ×1 IMPLANT
GOWN STRL REUS W/TWL LRG LVL3 (GOWN DISPOSABLE) ×4
GOWN STRL REUS W/TWL XL LVL3 (GOWN DISPOSABLE) ×2
KIT BASIN OR (CUSTOM PROCEDURE TRAY) ×3 IMPLANT
KIT ROOM TURNOVER OR (KITS) ×3 IMPLANT
MESH 3DMAX 4X6 RT LRG (Mesh General) ×3 IMPLANT
NEEDLE INSUFFLATION 14GA 120MM (NEEDLE) ×3 IMPLANT
NS IRRIG 1000ML POUR BTL (IV SOLUTION) ×3 IMPLANT
PAD ARMBOARD 7.5X6 YLW CONV (MISCELLANEOUS) ×3 IMPLANT
RELOAD STAPLE HERNIA 4.0 BLUE (INSTRUMENTS) ×3 IMPLANT
RELOAD STAPLE HERNIA 4.8 BLK (STAPLE) IMPLANT
SCISSORS LAP 5X35 DISP (ENDOMECHANICALS) ×3 IMPLANT
SET IRRIG TUBING LAPAROSCOPIC (IRRIGATION / IRRIGATOR) IMPLANT
SET TROCAR LAP APPLE-HUNT 5MM (ENDOMECHANICALS) ×3 IMPLANT
SPONGE GAUZE 2X2 STER 10/PKG (GAUZE/BANDAGES/DRESSINGS) ×2
STAPLER HERNIA 12 8.5 360D (INSTRUMENTS) ×3 IMPLANT
STRIP CLOSURE SKIN 1/2X4 (GAUZE/BANDAGES/DRESSINGS) ×2 IMPLANT
SUT MNCRL AB 4-0 PS2 18 (SUTURE) ×6 IMPLANT
SUT VIC AB 1 CT1 27 (SUTURE)
SUT VIC AB 1 CT1 27XBRD ANBCTR (SUTURE) IMPLANT
SYRINGE TOOMEY DISP (SYRINGE) ×3 IMPLANT
TAPE CLOTH SURG 4X10 WHT LF (GAUZE/BANDAGES/DRESSINGS) ×3 IMPLANT
TOWEL OR 17X24 6PK STRL BLUE (TOWEL DISPOSABLE) ×3 IMPLANT
TOWEL OR 17X26 10 PK STRL BLUE (TOWEL DISPOSABLE) ×3 IMPLANT
TRAY FOLEY CATH SILVER 14FR (SET/KITS/TRAYS/PACK) ×3 IMPLANT
TRAY LAPAROSCOPIC MC (CUSTOM PROCEDURE TRAY) ×3 IMPLANT
TROCAR XCEL 12X100 BLDLESS (ENDOMECHANICALS) ×3 IMPLANT
TUBING INSUFFLATION (TUBING) ×3 IMPLANT
WATER STERILE IRR 1000ML POUR (IV SOLUTION) ×3 IMPLANT

## 2015-11-07 NOTE — Anesthesia Preprocedure Evaluation (Signed)
Anesthesia Evaluation  Patient identified by MRN, date of birth, ID band Patient awake    Reviewed: Allergy & Precautions, H&P , NPO status , Patient's Chart, lab work & pertinent test results  History of Anesthesia Complications Negative for: history of anesthetic complications  Airway Mallampati: II  TM Distance: >3 FB Neck ROM: Full    Dental  (+) Teeth Intact, Dental Advisory Given   Pulmonary Sleep apnea: denies. , former smoker, PE (2011; Lovenox bridge)   breath sounds clear to auscultation       Cardiovascular Exercise Tolerance: Poor hypertension, Pt. on medications + CAD (nonobstructive), + Peripheral Vascular Disease, +CHF and + DOE  + Valvular Problems/Murmurs  Rhythm:Regular Rate:Normal - Systolic murmurs pulm HTN Cardiomyopathy   TEE 06/2012 Study Conclusions  - Left ventricle: The cavity size was mildly dilated. Wall   thickness was normal. Systolic function was normal. The   estimated ejection fraction was in the range of 55% to   60%. - Aortic valve: There was no stenosis. - Mitral valve: Severe MR with flail P2 segment. PISA ERO   0.53 cm^2. Pulmonary vein doppler interrogation shows   systolic blunting but not reversal. - Left atrium: The atrium was moderately dilated. No   evidence of thrombus in the atrial cavity or appendage. - Right ventricle: The cavity size was normal. Systolic   function was normal. - Right atrium: No evidence of thrombus in the atrial cavity   or appendage. - Atrial septum: No defect or patent foramen ovale was   identified. Echo contrast study showed no right-to-left   atrial level shunt, at baseline or with provocation.    Neuro/Psych negative neurological ROS  negative psych ROS   GI/Hepatic negative GI ROS, Neg liver ROS, Pt noted to by hoarse on exam. Pt reports "that is normal".   Endo/Other  diabetes (preDM; diet controlled), Well Controlled, Type 2   Renal/GU Renal InsufficiencyRenal disease  negative genitourinary   Musculoskeletal  (+) Arthritis , Osteoarthritis,    Abdominal (+) + obese,   Peds  Hematology  (+) Blood dyscrasia (Lovenox), ,   Anesthesia Other Findings   Reproductive/Obstetrics negative OB ROS                             Anesthesia Physical Anesthesia Plan  ASA: III  Anesthesia Plan: General   Post-op Pain Management:    Induction: Intravenous  Airway Management Planned: Oral ETT  Additional Equipment: None  Intra-op Plan:   Post-operative Plan: Extubation in OR  Informed Consent: I have reviewed the patients History and Physical, chart, labs and discussed the procedure including the risks, benefits and alternatives for the proposed anesthesia with the patient or authorized representative who has indicated his/her understanding and acceptance.   Dental advisory given  Plan Discussed with: CRNA and Surgeon  Anesthesia Plan Comments:         Anesthesia Quick Evaluation

## 2015-11-07 NOTE — Discharge Instructions (Signed)
[facility] _______[facility] ° °INGUINAL HERNIA REPAIR: POST OP INSTRUCTIONS ° °Always review your discharge instruction sheet given to you by the facility where your surgery was performed. °IF YOU HAVE DISABILITY OR FAMILY LEAVE FORMS, YOU MUST BRING THEM TO THE OFFICE FOR PROCESSING.   °DO NOT GIVE THEM TO YOUR DOCTOR. ° °1. A  prescription for pain medication may be given to you upon discharge.  Take your pain medication as prescribed, if needed.  If narcotic pain medicine is not needed, then you may take acetaminophen (Tylenol) or ibuprofen (Advil) as needed. °2. Take your usually prescribed medications unless otherwise directed. °3. If you need a refill on your pain medication, please contact your pharmacy.  They will contact our office to request authorization. Prescriptions will not be filled after 5 pm or on week-ends. °4. You should follow a light diet the first 24 hours after arrival home, such as soup and crackers, etc.  Be sure to include lots of fluids daily.  Resume your normal diet the day after surgery. °5. Most patients will experience some swelling and bruising around the umbilicus or in the groin and scrotum.  Ice packs and reclining will help.  Swelling and bruising can take several days to resolve.  °6. It is common to experience some constipation if taking pain medication after surgery.  Increasing fluid intake and taking a stool softener (such as Colace) will usually help or prevent this problem from occurring.  A mild laxative (Milk of Magnesia or Miralax) should be taken according to package directions if there are no bowel movements after 48 hours. °7. Unless discharge instructions indicate otherwise, you may remove your bandages 24-48 hours after surgery, and you may shower at that time.  You may have steri-strips (small skin tapes) in place directly over the incision.  These strips should be left on the skin for 7-10 days.  If your surgeon used skin glue on the incision, you  may shower in 24 hours.  The glue will flake off over the next 2-3 weeks.  Any sutures or staples will be removed at the office during your follow-up visit. °8. ACTIVITIES:  You may resume regular (light) daily activities beginning the next day--such as daily self-care, walking, climbing stairs--gradually increasing activities as tolerated.  You may have sexual intercourse when it is comfortable.  Refrain from any heavy lifting or straining until approved by your doctor. °a. You may drive when you are no longer taking prescription pain medication, you can comfortably wear a seatbelt, and you can safely maneuver your car and apply brakes. °b. RETURN TO WORK:  __________________________________________________________ °9. You should see your doctor in the office for a follow-up appointment approximately 2-3 weeks after your surgery.  Make sure that you call for this appointment within a day or two after you arrive home to insure a convenient appointment time. °10. OTHER INSTRUCTIONS:  __________________________________________________________________________________________________________________________________________________________________________________________  °WHEN TO CALL YOUR DOCTOR: °1. Fever over 101.0 °2. Inability to urinate °3. Nausea and/or vomiting °4. Extreme swelling or bruising °5. Continued bleeding from incision. °6. Increased pain, redness, or drainage from the incision ° °The clinic staff is available to answer your questions during regular business hours.  Please don’t hesitate to call and ask to speak to one of the nurses for clinical concerns.  If you have a medical emergency, go to the nearest emergency room or call 911.  A surgeon from [facility] is always on call at the hospital ° ° °[address], [address], [place], [place]  [address] ? ° [address] [address], [place], [place]   [address] °[phone] ? [phone] ? FAX [phone] °Web site:  [email or web address] ° °

## 2015-11-07 NOTE — Anesthesia Postprocedure Evaluation (Signed)
Anesthesia Post Note  Patient: [name]  Procedure(s) Performed: Procedure(s) (LRB): LAPAROSCOPIC RIGHT INGUINAL HERNIA WITH MESH (Right) INSERTION OF MESH (Right)  Patient location during evaluation: PACU Anesthesia Type: General Level of consciousness: awake Pain management: pain level controlled Vital Signs Assessment: post-procedure vital signs reviewed and stable Respiratory status: spontaneous breathing Cardiovascular status: stable Postop Assessment: no signs of nausea or vomiting Anesthetic complications: no    Last Vitals:  Vitals:   11/07/15 0930 11/07/15 0945  BP: 115/81 (!) 133/94  Pulse: 76 72  Resp: 19 19  Temp:  36.7 C    Last Pain:  Vitals:   11/07/15 0945  TempSrc:   PainSc: 2                  Shariya Gaster

## 2015-11-07 NOTE — Transfer of Care (Signed)
Immediate Anesthesia Transfer of Care Note  Patient: [name]  Procedure(s) Performed: Procedure(s): LAPAROSCOPIC RIGHT INGUINAL HERNIA WITH MESH (Right) INSERTION OF MESH (Right)  Patient Location: PACU  Anesthesia Type:General  Level of Consciousness: awake, alert  and oriented  Airway & Oxygen Therapy: Patient Spontanous Breathing and Patient connected to nasal cannula oxygen  Post-op Assessment: Report given to RN and Post -op Vital signs reviewed and stable  Post vital signs: Reviewed and stable  Last Vitals:  Vitals:   11/07/15 0644  BP: 127/81  Pulse: 71  Resp: 20  Temp: 36.7 C    Last Pain:  Vitals:   11/07/15 0644  TempSrc: Oral         Complications: No apparent anesthesia complications

## 2015-11-07 NOTE — Interval H&P Note (Signed)
History and Physical Interval Note:  11/07/2015 7:21 AM  [name]  has presented today for surgery, with the diagnosis of Right inguinal hernia  The various methods of treatment have been discussed with the patient and family. After consideration of risks, benefits and other options for treatment, the patient has consented to  Procedure(s): LAPAROSCOPIC RIGHT INGUINAL HERNIA WITH MESH (Right) INSERTION OF MESH (Right) as a surgical intervention .  The patient's history has been reviewed, patient examined, no change in status, stable for surgery.  I have reviewed the patient's chart and labs.  Questions were answered to the patient's satisfaction.     Marigene Ehlers., Jed Limerick

## 2015-11-07 NOTE — Op Note (Signed)
11/07/2015  8:29 AM  PATIENT:  [name]  70 y.o. male  PRE-OPERATIVE DIAGNOSIS:  Right inguinal hernia  POST-OPERATIVE DIAGNOSIS:  Right indirect inguinal hernia  PROCEDURE:  Procedure(s): LAPAROSCOPIC RIGHT INGUINAL HERNIA WITH MESH (Right) INSERTION OF MESH (Right)  SURGEON:  Surgeon(s) and Role:    * Axel Filler, MD - Primary  ANESTHESIA:   local and general  EBL:  5cc  BLOOD ADMINISTERED:none  DRAINS: none   LOCAL MEDICATIONS USED:  BUPIVICAINE   SPECIMEN:  No Specimen  DISPOSITION OF SPECIMEN:  N/A  COUNTS:  YES  TOURNIQUET:  * No tourniquets in log *  DICTATION: .Dragon Dictation  Counts: reported as correct x 2  Findings:  The patient had a large right indirect hernia  Indications for procedure:  The patient is a 70 year old male with a right hernia for several months. Patient complained of symptomatology to his right inguinal area. The patient was taken back for elective inguinal hernia repair.  Details of the procedure: The patient was taken back to the operating room. The patient was placed in supine position with bilateral SCDs in place.  The patient was prepped and draped in the usual sterile fashion.  After appropriate anitbiotics were confirmed, a time-out was confirmed and all facts were verified.  0.25% Marcaine was used to infiltrate the umbilical area. A 11-blade was used to cut down the skin and blunt dissection was used to get the anterior fashion.  The anterior fascia was incised approximately 1 cm and the muscles were retracted laterally. Blunt dissection was then used to create a space in the preperitoneal area. At this time a 10 mm camera was then introduced into the space and advanced the pubic tubercle and a 12 mm trocar was placed over this and insufflation was started.  At this time and space was created from medial to laterally the preperitoneal space.  Cooper's ligament was initially cleaned off.  The hernia sac was identified in the  indirect space. Dissection of the hernia sac was undertaken the vas deferens was identified and protected in all parts of the case.      Once the hernia sac was taken down to approximately the umbilicus a Bard 3D Max mesh, size: Large, was  introduced into the preperitoneal space.  The mesh was brought over to cover the direct and indirect hernia spaces.  This was anchored into place and secured to Cooper's ligament with 4.35mm staples from a Coviden hernia stapler. It was anchored to the anterior abdominal wall with 4.8 mm staples. The hernia sac was seen lying posterior to the mesh. There was no staples placed laterally. The insufflation was evacuated and the peritoneum was seen posterior to the mesh. The trochars were removed. The anterior fascia was reapproximated using #1 Vicryl on a UR- 6.  Intra-abdominal air was evacuated and the Veress needle removed. The skin was reapproximated using 4-0 Monocryl subcuticular fashion the patient was awakened from general anesthesia and taken to recovery in stable condition.   PLAN OF CARE: Discharge to home after PACU  PATIENT DISPOSITION:  PACU - hemodynamically stable.   Delay start of Pharmacological VTE agent (>24hrs) due to surgical blood loss or risk of bleeding: not applicable

## 2015-11-07 NOTE — Anesthesia Procedure Notes (Signed)
Procedure Name: Intubation Performed by: Margaree Mackintosh Pre-anesthesia Checklist: Patient identified, Emergency Drugs available, Suction available, Patient being monitored and Timeout performed Patient Re-evaluated:Patient Re-evaluated prior to inductionOxygen Delivery Method: Circle system utilized Preoxygenation: Pre-oxygenation with 100% oxygen Intubation Type: IV induction Ventilation: Mask ventilation without difficulty and Oral airway inserted - appropriate to patient size Laryngoscope Size: Mac and 4 Grade View: Grade II Tube type: Oral Tube size: 7.5 mm Number of attempts: 1 Airway Equipment and Method: Stylet Placement Confirmation: ETT inserted through vocal cords under direct vision,  positive ETCO2 and breath sounds checked- equal and bilateral Secured at: 22 cm Tube secured with: Tape Dental Injury: Teeth and Oropharynx as per pre-operative assessment

## 2015-11-07 NOTE — H&P (View-Only) (Signed)
History of Present Illness (Cheyenne Schumm MD; 10/13/2015 3:37 PM) The patient is a 70 year old male who presents with an inguinal hernia. The patient is a 70-year-old male who presented been seen secondary to right inguinal hernia. Patient states that the hernia is continue to bother him. He states that it has gotten minimally larger. He's had no signs or symptoms of incarceration or granulation.  Patient has seen Dr. McElhaney recently and feels was in good health per the patient.   Allergies (Sonya Bynum, CMA; 10/13/2015 3:22 PM) Statins Depletion *DIETARY PRODUCTS/DIETARY MANAGEMENT PRODUCTS* WELCHOL  Medication History (Sonya Bynum, CMA; 10/13/2015 3:22 PM) Ezetimibe (10MG Tablet, Oral) Active. Fenofibrate Micronized (134MG Capsule, Oral) Active. Warfarin Sodium (5MG Tablet, Oral) Active. Tylenol Extra Strength (500MG Tablet, Oral) Active. Lotensin (20MG Tablet, Oral) Active. Vitamin D3 Active. Loratadine (10MG Capsule, Oral) Active. Nasonex (50MCG/ACT Suspension, Nasal) Active. Medications Reconciled    Review of Systems (Christapher Gillian, MD; 10/13/2015 3:38 PM) General Not Present- Fever. Respiratory Not Present- Cough and Difficulty Breathing. Cardiovascular Not Present- Chest Pain. Gastrointestinal Present- Abdominal Pain. Musculoskeletal Not Present- Myalgia. Neurological Not Present- Weakness.  Vitals (Sonya Bynum CMA; 10/13/2015 3:22 PM) 10/13/2015 3:22 PM Weight: 224 lb Height: 72in Body Surface Area: 2.24 m Body Mass Index: 30.38 kg/m  Temp.: 97.5F(Temporal)  Pulse: 76 (Regular)  BP: 134/74 (Sitting, Left Arm, Standard)       Physical Exam (Laymond Postle MD; 10/13/2015 3:37 PM) General Mental Status-Alert. General Appearance-Consistent with stated age. Hydration-Well hydrated. Voice-Normal.  Head and Neck Head-normocephalic, atraumatic with no lesions or palpable masses. Trachea-midline.  Eye Eyeball -  Bilateral-Extraocular movements intact. Sclera/Conjunctiva - Bilateral-No scleral icterus.  Chest and Lung Exam Chest and lung exam reveals -quiet, even and easy respiratory effort with no use of accessory muscles. Inspection Chest Wall - Normal. Back - normal.  Cardiovascular Cardiovascular examination reveals -normal heart sounds, regular rate and rhythm with no murmurs.  Abdomen Inspection Skin - Scar - no surgical scars. Hernias - Inguinal hernia - Right - Reducible(Large likely direct). Palpation/Percussion Normal exam - Soft, Non Tender, No Rebound tenderness, No Rigidity (guarding) and No hepatosplenomegaly. Auscultation Normal exam - Bowel sounds normal.  Neurologic Neurologic evaluation reveals -alert and oriented x 3 with no impairment of recent or remote memory. Mental Status-Normal.  Musculoskeletal Normal Exam - Left-Upper Extremity Strength Normal and Lower Extremity Strength Normal. Normal Exam - Right-Upper Extremity Strength Normal, Lower Extremity Weakness.    Assessment & Plan (Satrina Magallanes MD; 10/13/2015 3:38 PM) RIGHT INGUINAL HERNIA (K40.90) Impression: 70-year-old male with a right inguinal hernia, likely direct  1. The patient will like to proceed to the operating room for laparoscopic right inguinal hernia repair with mesh.  2. I discussed with the patient the signs and symptoms of incarceration and strangulation and the need to proceed to the ER should they occur.  3. I discussed with the patient the risks and benefits of the procedure to include but not limited to: Infection, bleeding, damage to surrounding structures, possible need for further surgery, possible nerve pain, and possible recurrence. The patient was understanding and wishes to proceed. 4. The patient will require cardiac clearance by Dr. McElhaney. Patient will also need to be off Coumadin. We'll get that cleared by the Coumadin clinic. 

## 2015-11-10 ENCOUNTER — Encounter (HOSPITAL_COMMUNITY): Payer: Self-pay | Admitting: General Surgery

## 2015-11-13 ENCOUNTER — Ambulatory Visit (INDEPENDENT_AMBULATORY_CARE_PROVIDER_SITE_OTHER): Payer: 59 | Admitting: *Deleted

## 2015-11-13 DIAGNOSIS — Z9889 Other specified postprocedural states: Secondary | ICD-10-CM

## 2015-11-13 DIAGNOSIS — I2699 Other pulmonary embolism without acute cor pulmonale: Secondary | ICD-10-CM

## 2015-11-13 DIAGNOSIS — Z7901 Long term (current) use of anticoagulants: Secondary | ICD-10-CM | POA: Diagnosis not present

## 2015-11-13 LAB — POCT INR: INR: 2

## 2015-12-01 ENCOUNTER — Other Ambulatory Visit: Payer: Self-pay | Admitting: Internal Medicine

## 2015-12-01 MED FILL — FENOFIBRATE 134 MG CAPSULE: 134 | 90 days supply | Qty: 90 | Fill #0

## 2015-12-11 ENCOUNTER — Ambulatory Visit (INDEPENDENT_AMBULATORY_CARE_PROVIDER_SITE_OTHER): Payer: 59 | Admitting: *Deleted

## 2015-12-11 ENCOUNTER — Encounter (INDEPENDENT_AMBULATORY_CARE_PROVIDER_SITE_OTHER): Payer: Self-pay

## 2015-12-11 DIAGNOSIS — I2699 Other pulmonary embolism without acute cor pulmonale: Secondary | ICD-10-CM

## 2015-12-11 DIAGNOSIS — Z7901 Long term (current) use of anticoagulants: Secondary | ICD-10-CM

## 2015-12-11 DIAGNOSIS — Z9889 Other specified postprocedural states: Secondary | ICD-10-CM | POA: Diagnosis not present

## 2015-12-11 LAB — POCT INR: INR: 3.3

## 2016-01-01 ENCOUNTER — Ambulatory Visit (INDEPENDENT_AMBULATORY_CARE_PROVIDER_SITE_OTHER): Payer: 59 | Admitting: *Deleted

## 2016-01-01 DIAGNOSIS — Z9889 Other specified postprocedural states: Secondary | ICD-10-CM | POA: Diagnosis not present

## 2016-01-01 DIAGNOSIS — I2699 Other pulmonary embolism without acute cor pulmonale: Secondary | ICD-10-CM

## 2016-01-01 DIAGNOSIS — Z7901 Long term (current) use of anticoagulants: Secondary | ICD-10-CM

## 2016-01-01 LAB — POCT INR: INR: 2.6

## 2016-01-02 ENCOUNTER — Other Ambulatory Visit: Payer: Self-pay | Admitting: Cardiovascular Disease

## 2016-01-02 MED FILL — WARFARIN SODIUM 5 MG TABLET: 5 | 90 days supply | Qty: 180 | Fill #0

## 2016-01-06 ENCOUNTER — Encounter: Payer: Self-pay | Admitting: Internal Medicine

## 2016-01-06 ENCOUNTER — Ambulatory Visit (INDEPENDENT_AMBULATORY_CARE_PROVIDER_SITE_OTHER): Payer: 59 | Admitting: Internal Medicine

## 2016-01-06 VITALS — BP 124/60 | HR 76 | Temp 97.7°F | Resp 16 | Ht 72.0 in | Wt 227.0 lb

## 2016-01-06 DIAGNOSIS — J302 Other seasonal allergic rhinitis: Secondary | ICD-10-CM

## 2016-01-06 DIAGNOSIS — Z7901 Long term (current) use of anticoagulants: Secondary | ICD-10-CM | POA: Diagnosis not present

## 2016-01-06 DIAGNOSIS — Z6830 Body mass index (BMI) 30.0-30.9, adult: Secondary | ICD-10-CM

## 2016-01-06 DIAGNOSIS — E782 Mixed hyperlipidemia: Secondary | ICD-10-CM

## 2016-01-06 DIAGNOSIS — Z23 Encounter for immunization: Secondary | ICD-10-CM

## 2016-01-06 DIAGNOSIS — N183 Chronic kidney disease, stage 3 unspecified: Secondary | ICD-10-CM

## 2016-01-06 DIAGNOSIS — Z79899 Other long term (current) drug therapy: Secondary | ICD-10-CM

## 2016-01-06 DIAGNOSIS — E1122 Type 2 diabetes mellitus with diabetic chronic kidney disease: Secondary | ICD-10-CM

## 2016-01-06 DIAGNOSIS — I10 Essential (primary) hypertension: Secondary | ICD-10-CM | POA: Diagnosis not present

## 2016-01-06 MED ORDER — PREDNISONE 20 MG PO TABS: ORAL_TABLET | ORAL | 0 refills | Status: DC

## 2016-01-06 MED FILL — predniSONE 20 MG TABS: 20 | 5 days supply | Qty: 11 | Fill #0

## 2016-01-06 NOTE — Progress Notes (Signed)
Assessment and Plan:  Hypertension:  -Continue medication -monitor blood pressure at home. -Continue DASH diet -Reminder to go to the ER if any CP, SOB, nausea, dizziness, severe HA, changes vision/speech, left arm numbness and tingling and jaw pain.  Cholesterol - Continue diet and exercise -Check cholesterol.   Diabetes with diabetic chronic kidney disease  -well contolled with current diet -not currently checking cbgs at home and is not necessary at the moment -cont monitoring A1C twice yearly -Continue diet and exercise.   Vitamin D Def -continue medications.   Palpitations -secondary to positional change -patient offered EKG here which he declined -if worsening or new and sustained symptoms patient to be referred to cards for holter monitor/tilt table test.    Allergic rhinitis -switch to allegra -cont nasal spray -will send in prednisone for patient to take if desired.    Chronic anticoagulation secondary to valve replacement -not managed by this office.  -was recently checked and patient reports that INR was 2.6  Continue diet and meds as discussed. Further disposition pending results of labs. Discussed med's effects and SE's.    HPI 70 y.o. male  presents for 3 month follow up with hypertension, hyperlipidemia, diabetes and vitamin D deficiency.   His blood pressure has been controlled at home, today their BP is BP: 124/60.He does not formally workout, but he has been walking and also has been recovering from recent inguinal hernia surgery.  He still has the 20 lbs weight restriction. He denies chest pain, shortness of breath, dizziness.  He does have a mild flutter in his heart when he lays down.  It is not painful and does not have any dizziness shortness of breath or any other concerning symptoms with it.  He would like to just make [place] aware of it.    He is on cholesterol medication and denies myalgias. His cholesterol is at goal. The cholesterol was:  09/25/2015:  Cholesterol 150; HDL 52; LDL Cholesterol 85; Triglycerides 67   He has been working on diet for diabetes with diabetic chronic kidney disease, he is not on bASA, he is on ACE/ARB, and denies  foot ulcerations, hyperglycemia, hypoglycemia , increased appetite, nausea, paresthesia of the feet, polydipsia, polyuria, visual disturbances, vomiting and weight loss. Last A1C was: 09/25/2015: Hgb A1c MFr Bld 6.4.  He is not checking blood sugars at home.   Patient is on Vitamin D supplement. 09/25/2015: Vit D, 25-Hydroxy 51  His allergies have been giving him issues recently.  He is using his nasal spray and also taking claritin daily.  He reports that he chronically feels stuffy.   He has no other complaints today.  He would like to get a flu shot.     Current Medications:  Current Outpatient Prescriptions on File Prior to Visit  Medication Sig Dispense Refill  . acetaminophen (TYLENOL) 500 MG tablet Take 500 mg by mouth every 6 (six) hours as needed for pain.    . benazepril (LOTENSIN) 10 MG tablet Take 5 mg by mouth daily.     . Cholecalciferol (VITAMIN D PO) Take by mouth daily. TAKING 5,000 UNITS DAILY    . enoxaparin (LOVENOX) 150 MG/ML injection Inject 1 mL (150 mg total) into the skin daily. As Instructed by [facility]madin Clinic 10 Syringe 0  . fenofibrate micronized (LOFIBRA) 134 MG capsule TAKE 1 CAPSULE BY MOUTH EVERY EVENING. 90 capsule 3  . loratadine (CLARITIN) 10 MG tablet Take 10 mg by mouth daily as needed for allergies.    Marland Kitchen  mometasone (NASONEX) 50 MCG/ACT nasal spray Place 2 sprays into the nose daily as needed (allergies).     Marland Kitchen. oxyCODONE-acetaminophen (ROXICET) 5-325 MG tablet Take 1-2 tablets by mouth every 4 (four) hours as needed. 30 tablet 0  . Psyllium ([employer] NATURAL DAILY FIBER PO) Take 1 tablet by mouth daily.    Marland Kitchen. warfarin (COUMADIN) 5 MG tablet TAKE AS DIRECTED BY COUMADIN CLINIC. 180 tablet 1  . ezetimibe (ZETIA) 10 MG tablet Take 1 tablet daily for Cholesterol 90 tablet 99    No current facility-administered medications on file prior to visit.    Medical History:  Past Medical History:  Diagnosis Date  . Allergy    SEASONAL  . Cataract    SMALL IN LEFT EYE  . Coronary artery disease    non-obstructive by cath 2009  . Degenerative joint disease   . Heart murmur   . Hyperlipidemia   . Hypertension    Clearance with note Dr Marvel PlanMcGowan on chart,  Lovonox bridging  ordered by Dr Sanjuana KavaMcalhaney    EKG 9/12, chst 9/12 EPIC  . Inguinal hernia    right  . Osteoarthritis   . Peripheral vascular disease (HCC)   . Pre-diabetes   . Pulmonary embolism [facility](HCC)    march 2010, 2012              /   DVT x  2  LEFT 2010  . S/P mitral valve repair 08/15/2012   Complex valvuloplasty including triangular resection of posterior leaflet, artificial Gore-tex neocord placement x4 and Sorin Memo 3D ring annuloplasty via right mini thoracotomy approach  . Seasonal allergies   . Severe mitral regurgitation by prior echocardiogram   . Sleep apnea    STOP BANG SCORE 4  . Vitamin D deficiency   . Wears glasses    Allergies:  Allergies  Allergen Reactions  . Statins     REACTION: muscle aches  . Welchol [Colesevelam Hcl]      Review of Systems:  Review of Systems  Constitutional: Negative for chills, fever and malaise/fatigue.  HENT: Negative for congestion, ear pain and sore throat.   Eyes: Negative.   Respiratory: Negative for cough, shortness of breath and wheezing.   Cardiovascular: Negative for chest pain, palpitations and leg swelling.  Gastrointestinal: Negative for abdominal pain, blood in stool, constipation, diarrhea, heartburn and melena.  Genitourinary: Negative.   Skin: Negative.   Neurological: Negative for dizziness, sensory change, loss of consciousness and headaches.  Psychiatric/Behavioral: Negative for depression. The patient is not nervous/anxious and does not have insomnia.     Family history- Review and unchanged  Social history- Review and  unchanged  Physical Exam: BP 124/60   Pulse 76   Temp 97.7 F (36.5 C)   Resp 16   Ht 6' (1.829 m)   Wt 227 lb (103 kg)   SpO2 98%   BMI 30.79 kg/m  Wt Readings from Last 3 Encounters:  01/06/16 227 lb (103 kg)  11/07/15 222 lb (100.7 kg)  09/25/15 223 lb 9.6 oz (101.4 kg)   General Appearance: Well nourished well developed, non-toxic appearing, in no apparent distress. Eyes: PERRLA, EOMs, conjunctiva no swelling or erythema ENT/Mouth: Ear canals clear with no erythema, swelling, or discharge.  TMs normal bilaterally, oropharynx clear, moist, with no exudate.   Neck: Supple, thyroid normal, no JVD, no cervical adenopathy.  Respiratory: Respiratory effort normal, breath sounds clear A&P, no wheeze, rhonchi or rales noted.  No retractions, no accessory muscle usage Cardio:  RRR with no RGs.  Soft clicking noted over the LSB.  No noted edema.  Abdomen: Soft, + BS.  Non tender, no guarding, rebound, hernias, masses. Musculoskeletal: Full ROM, 5/5 strength, Normal gait Skin: Warm, dry without rashes, lesions, ecchymosis.  Neuro: Awake and oriented X 3, Cranial nerves intact. No cerebellar symptoms.  Psych: normal affect, Insight and Judgment appropriate.    Terri Piedra, PA-C 1:19 PM [facility]

## 2016-01-08 ENCOUNTER — Telehealth: Payer: Self-pay | Admitting: Pharmacist

## 2016-01-08 NOTE — Telephone Encounter (Addendum)
Patient called to tell us that he was given a prednisone prescription 2 days ago to use as needed for allergic rhinitis. He has not started taking it yet. Advised him that he should call us if he does because we will need to check his INR. If he does not start it, will keep his next Coumadin appt as is in 3 weeks. He verbalized understanding.

## 2016-01-09 MED FILL — EZETIMIBE 10 MG TABLET: 10 | 90 days supply | Qty: 90 | Fill #1

## 2016-01-29 ENCOUNTER — Ambulatory Visit (INDEPENDENT_AMBULATORY_CARE_PROVIDER_SITE_OTHER): Payer: 59 | Admitting: *Deleted

## 2016-01-29 DIAGNOSIS — Z7901 Long term (current) use of anticoagulants: Secondary | ICD-10-CM | POA: Diagnosis not present

## 2016-01-29 DIAGNOSIS — I2699 Other pulmonary embolism without acute cor pulmonale: Secondary | ICD-10-CM

## 2016-01-29 DIAGNOSIS — Z9889 Other specified postprocedural states: Secondary | ICD-10-CM | POA: Diagnosis not present

## 2016-01-29 LAB — POCT INR: INR: 2.4

## 2016-02-23 ENCOUNTER — Ambulatory Visit (INDEPENDENT_AMBULATORY_CARE_PROVIDER_SITE_OTHER): Payer: 59 | Admitting: Physician Assistant

## 2016-02-23 ENCOUNTER — Encounter: Payer: Self-pay | Admitting: Physician Assistant

## 2016-02-23 VITALS — BP 126/74 | HR 66 | Temp 97.7°F | Resp 14 | Ht 72.0 in | Wt 224.6 lb

## 2016-02-23 DIAGNOSIS — Z860100 Personal history of colon polyps, unspecified: Secondary | ICD-10-CM

## 2016-02-23 DIAGNOSIS — E559 Vitamin D deficiency, unspecified: Secondary | ICD-10-CM | POA: Diagnosis not present

## 2016-02-23 DIAGNOSIS — Z7901 Long term (current) use of anticoagulants: Secondary | ICD-10-CM | POA: Diagnosis not present

## 2016-02-23 DIAGNOSIS — R6889 Other general symptoms and signs: Secondary | ICD-10-CM

## 2016-02-23 DIAGNOSIS — M1990 Unspecified osteoarthritis, unspecified site: Secondary | ICD-10-CM

## 2016-02-23 DIAGNOSIS — N183 Chronic kidney disease, stage 3 unspecified: Secondary | ICD-10-CM

## 2016-02-23 DIAGNOSIS — R0989 Other specified symptoms and signs involving the circulatory and respiratory systems: Secondary | ICD-10-CM

## 2016-02-23 DIAGNOSIS — Z9889 Other specified postprocedural states: Secondary | ICD-10-CM

## 2016-02-23 DIAGNOSIS — E782 Mixed hyperlipidemia: Secondary | ICD-10-CM | POA: Diagnosis not present

## 2016-02-23 DIAGNOSIS — Z23 Encounter for immunization: Secondary | ICD-10-CM | POA: Diagnosis not present

## 2016-02-23 DIAGNOSIS — I10 Essential (primary) hypertension: Secondary | ICD-10-CM

## 2016-02-23 DIAGNOSIS — J302 Other seasonal allergic rhinitis: Secondary | ICD-10-CM

## 2016-02-23 DIAGNOSIS — E1122 Type 2 diabetes mellitus with diabetic chronic kidney disease: Secondary | ICD-10-CM

## 2016-02-23 DIAGNOSIS — E6609 Other obesity due to excess calories: Secondary | ICD-10-CM | POA: Diagnosis not present

## 2016-02-23 DIAGNOSIS — Z0000 Encounter for general adult medical examination without abnormal findings: Secondary | ICD-10-CM

## 2016-02-23 DIAGNOSIS — R0609 Other forms of dyspnea: Secondary | ICD-10-CM

## 2016-02-23 DIAGNOSIS — Z79899 Other long term (current) drug therapy: Secondary | ICD-10-CM | POA: Diagnosis not present

## 2016-02-23 DIAGNOSIS — Z0001 Encounter for general adult medical examination with abnormal findings: Secondary | ICD-10-CM | POA: Diagnosis not present

## 2016-02-23 DIAGNOSIS — Z6830 Body mass index (BMI) 30.0-30.9, adult: Secondary | ICD-10-CM

## 2016-02-23 LAB — BASIC METABOLIC PANEL WITH GFR
BUN: 21 mg/dL (ref 7–25)
CHLORIDE: 103 mmol/L (ref 98–110)
CO2: 26 mmol/L (ref 20–31)
Calcium: 9.7 mg/dL (ref 8.6–10.3)
Creat: 1.52 mg/dL — ABNORMAL HIGH (ref 0.70–1.25)
GFR, EST AFRICAN AMERICAN: 53 mL/min — AB (ref >=60)
GFR, EST NON AFRICAN AMERICAN: 46 mL/min — AB (ref >=60)
Glucose, Bld: 115 mg/dL — ABNORMAL HIGH (ref 65–99)
POTASSIUM: 4.5 mmol/L (ref 3.5–5.3)
Sodium: 138 mmol/L (ref 135–146)

## 2016-02-23 LAB — HEMOGLOBIN A1C
Hgb A1c MFr Bld: 6.3 % — ABNORMAL HIGH (ref <5.7)
Mean Plasma Glucose: 134 mg/dL

## 2016-02-23 LAB — TSH: TSH: 1.21 mIU/L (ref 0.40–4.50)

## 2016-02-23 LAB — HEPATIC FUNCTION PANEL
ALK PHOS: 44 U/L (ref 40–115)
ALT: 23 U/L (ref 9–46)
AST: 36 U/L — AB (ref 10–35)
Albumin: 4.6 g/dL (ref 3.6–5.1)
BILIRUBIN DIRECT: 0.2 mg/dL (ref <=0.2)
BILIRUBIN INDIRECT: 0.5 mg/dL (ref 0.2–1.2)
BILIRUBIN TOTAL: 0.7 mg/dL (ref 0.2–1.2)
Total Protein: 7.3 g/dL (ref 6.1–8.1)

## 2016-02-23 LAB — CBC WITH DIFFERENTIAL/PLATELET
BASOS PCT: 1 %
Basophils Absolute: 39 cells/uL (ref 0–200)
Eosinophils Absolute: 78 cells/uL (ref 15–500)
Eosinophils Relative: 2 %
HCT: 46.1 % (ref 38.5–50.0)
Hemoglobin: 15.4 g/dL (ref 13.2–17.1)
LYMPHS PCT: 45 %
Lymphs Abs: 1755 cells/uL (ref 850–3900)
MCH: 31 pg (ref 27.0–33.0)
MCHC: 33.4 g/dL (ref 32.0–36.0)
MCV: 92.8 fL (ref 80.0–100.0)
MONO ABS: 273 {cells}/uL (ref 200–950)
MONOS PCT: 7 %
MPV: 11.4 fL (ref 7.5–12.5)
NEUTROS ABS: 1755 {cells}/uL (ref 1500–7800)
Neutrophils Relative %: 45 %
PLATELETS: 247 10*3/uL (ref 140–400)
RBC: 4.97 MIL/uL (ref 4.20–5.80)
RDW: 14.2 % (ref 11.0–15.0)
WBC: 3.9 10*3/uL (ref 3.8–10.8)

## 2016-02-23 LAB — LIPID PANEL
CHOL/HDL RATIO: 3.1 ratio (ref <5.0)
Cholesterol: 160 mg/dL (ref <200)
HDL: 52 mg/dL (ref >40)
LDL CALC: 94 mg/dL (ref <100)
TRIGLYCERIDES: 72 mg/dL (ref <150)
VLDL: 14 mg/dL (ref <30)

## 2016-02-23 LAB — MAGNESIUM: Magnesium: 2 mg/dL (ref 1.5–2.5)

## 2016-02-23 NOTE — Patient Instructions (Addendum)
Call your eye doctor for an appointment   Shortness of Breath, Adult Shortness of breath is when a person has trouble breathing enough air, or when a person feels like she or he is having trouble breathing in enough air. Shortness of breath could be a sign of medical problem. Follow these instructions at home: Pay attention to any changes in your symptoms. Take these actions to help with your condition:  Do not smoke. Smoking is a common cause of shortness of breath. If you smoke and you need help quitting, ask your health care provider.  Avoid things that can irritate your airways, such as:  Mold.  Dust.  Air pollution.  Chemical fumes.  Things that can cause allergy symptoms (allergens), if you have allergies.  Keep your living space clean and free of mold and dust.  Rest as needed. Slowly return to your usual activities.  Take over-the-counter and prescription medicines, including oxygen and inhaled medicines, only as told by your health care provider.  Keep all follow-up visits as told by your health care provider. This is important. Contact a health care provider if:  Your condition does not improve as soon as expected.  You have a hard time doing your normal activities, even after you rest.  You have new symptoms. Get help right away if:  Your shortness of breath gets worse.  You have shortness of breath when you are resting.  You feel light-headed or you faint.  You have a cough that is not controlled with medicines.  You cough up blood.  You have pain with breathing.  You have pain in your chest, arms, shoulders, or abdomen.  You have a fever.  You cannot walk up stairs or exercise the way that you normally do. This information is not intended to replace advice given to you by your health care provider. Make sure you discuss any questions you have with your health care provider. Document Released: 12/01/2000 Document Revised: 09/27/2015 Document  Reviewed: 08/14/2015 Elsevier Interactive Patient Education  2017 [employer].

## 2016-02-23 NOTE — Progress Notes (Signed)
MEDICARE ANNUAL WELLNESS VISIT AND FOLLOW UP Assessment:   Essential hypertension - continue medications, DASH diet, exercise and monitor at home. Call if greater than 130/80.  -     CBC with Differential/Platelet -     BASIC METABOLIC PANEL WITH GFR -     Hepatic function panel -     TSH -     DG Chest 2 View; Future  Type 2 diabetes mellitus with stage 3 chronic kidney disease, without long-term current use of insulin (HCC) Discussed general issues about diabetes pathophysiology and management., Educational material distributed., Suggested low cholesterol diet., Encouraged aerobic exercise., Discussed foot care., Reminded to get yearly retinal exam. -     Hemoglobin A1c  Chronic seasonal allergic rhinitis, unspecified trigger - Allegra OTC, increase H20, allergy hygiene explained.  Osteoarthritis, unspecified osteoarthritis type, unspecified site  Mixed hyperlipidemia -continue medications, check lipids, decrease fatty foods, increase activity.  -     Lipid panel  Vitamin D deficiency -     VITAMIN D 25 Hydroxy (Vit-D Deficiency, Fractures)  Medication management -     Magnesium  Class 1 obesity due to excess calories with serious comorbidity and body mass index (BMI) of 30.0 to 30.9 in adult - long discussion about weight loss, diet, and exercise -     Lipid panel -     Hemoglobin A1c  Hx of colonic polyps Up to date  S/P mitral valve repair Cont follow up cardio  Right carotid bruit Continue coumadin clinic  Long term current use of anticoagulant therapy Continue coumadin clinic  Encounter for Medicare annual wellness exam Will get TD next OV, declines zosta due to cost  Need for prophylactic vaccination against Streptococcus pneumoniae (pneumococcus) -     Pneumococcal conjugate vaccine 13-valent IM  Dyspnea on exertion Patient complains of dyspnea on exertion last few months, no pain, no cough/wheezing, dizziness, no GERD symptoms, no palpitations, occ  diaphoresis with it, will get CXR, will discuss with cardio next OV, increase exercise ? From deconditioning, check labs, if any CP, worsening SOB, etc will go to ER  Hep C screening next OV Over 30 minutes of exam, counseling, chart review, and critical decision making was performed  Future Appointments Date Time Provider Department Center  02/26/2016 8:30 AM [facility]  04/08/2016 9:30 AM Lucky Cowboy, MD [facility] None  10/19/2016 10:00 AM Lucky Cowboy, MD [facility] None     Plan:   During the course of the visit the patient was educated and counseled about appropriate screening and preventive services including:    Pneumococcal vaccine   Influenza vaccine  Prevnar 13  Td vaccine  Screening electrocardiogram  Colorectal cancer screening  Diabetes screening  Glaucoma screening  Nutrition counseling    Subjective:  [name] is a 70 y.o. AA male who presents for Medicare Annual Wellness Visit and 3 month follow up for HTN, hyperlipidemia, diabetes, and vitamin D Def.   His blood pressure has been controlled at home, today their BP is BP: 126/74 He does not workout. He denies chest pain, shortness of breath, dizziness. He states that he has some SOB with exertion and occ diaphoresis, no cough, wheezing, CP, nausea, dizziness.  He has history of CAD with cath in 2009 showing mild CAD and severe MR, had MVR in 2014, follows with Dr. Barry Dienes and Dr. Sanjuana Kava. He has history of PE provoked by ortho surgery in 2010 and again in 2012, now on coumadin since that time.  Lab Results  Component Value Date   INR 2.4 01/29/2016   INR 2.6 01/01/2016   INR 3.3 12/11/2015   PROTIME 18.2 09/03/2008   He is on cholesterol medication and denies myalgias. His cholesterol is at goal. The cholesterol last visit was:  He can not tolerate statins.  Lab Results  Component Value Date   CHOL 150 09/25/2015   HDL 52 09/25/2015   LDLCALC 85  09/25/2015   TRIG 67 09/25/2015   CHOLHDL 2.9 09/25/2015   He has been working on diet and exercise for Diabetes with diabetic chronic kidney disease, he is not on bASA due to coumadin use, he is on ACE/ARB, and denies paresthesia of the feet, polydipsia, polyuria and visual disturbances. Last A1C was:  Lab Results  Component Value Date   HGBA1C 6.4 (H) 09/25/2015   Last GFR Lab Results  Component Value Date   GFRAA 54 (L) 11/04/2015    Patient is on Vitamin D supplement.   Lab Results  Component Value Date   VD25OH 51 09/25/2015     BMI is Body mass index is 30.46 kg/m., he is working on diet and exercise. Wt Readings from Last 3 Encounters:  02/23/16 224 lb 9.6 oz (101.9 kg)  01/06/16 227 lb (103 kg)  11/07/15 222 lb (100.7 kg)    Medication Review: Current Outpatient Prescriptions on File Prior to Visit  Medication Sig Dispense Refill  . acetaminophen (TYLENOL) 500 MG tablet Take 500 mg by mouth every 6 (six) hours as needed for pain.    . benazepril (LOTENSIN) 10 MG tablet Take 5 mg by mouth daily.     . Cholecalciferol (VITAMIN D PO) Take by mouth daily. TAKING 5,000 UNITS DAILY    . fenofibrate micronized (LOFIBRA) 134 MG capsule TAKE 1 CAPSULE BY MOUTH EVERY EVENING. 90 capsule 3  . loratadine (CLARITIN) 10 MG tablet Take 10 mg by mouth daily as needed for allergies.    . mometasone (NASONEX) 50 MCG/ACT nasal spray Place 2 sprays into the nose daily as needed (allergies).     Marland Kitchen oxyCODONE-acetaminophen (ROXICET) 5-325 MG tablet Take 1-2 tablets by mouth every 4 (four) hours as needed. 30 tablet 0  . predniSONE (DELTASONE) 20 MG tablet 3 tabs po day one, then 2 tabs daily x 4 days 11 tablet 0  . Psyllium ([employer] NATURAL DAILY FIBER PO) Take 1 tablet by mouth daily.    Marland Kitchen warfarin (COUMADIN) 5 MG tablet TAKE AS DIRECTED BY COUMADIN CLINIC. 180 tablet 1  . ezetimibe (ZETIA) 10 MG tablet Take 1 tablet daily for Cholesterol 90 tablet 99   No current facility-administered  medications on file prior to visit.     Allergies: Allergies  Allergen Reactions  . Statins     REACTION: muscle aches  . Welchol [Colesevelam Hcl]     Current Problems (verified) has Long term current use of anticoagulant therapy; Right carotid bruit; S/P mitral valve repair; Hyperlipidemia; Hypertension; Seasonal allergies; Osteoarthritis; Vitamin D deficiency; Medication management; DM type 2 causing CKD stage 3 (HCC); Obesity; Hx of colonic polyps; and Encounter for Medicare annual wellness exam on his problem list.  Screening Tests Immunization History  Administered Date(s) Administered  . Influenza, High Dose Seasonal PF 12/20/2013, 01/14/2015, 01/06/2016  . Influenza-Unspecified 01/20/2013  . Pneumococcal Conjugate-13 02/23/2016  . Pneumococcal-Unspecified 05/11/2012  . Td 11/22/2003   Preventative care: Last colonoscopy: 06/2014 CXR 2014 DEXA 2010  Prior vaccinations: TD or Tdap: 2005 DUE next OV  Influenza: 2017 Pneumococcal: 2014  Prevnar13: DUE TODAY Shingles/Zostavax: Declines due to cost  Names of Other Physician/Practitioners you currently use: 1. [facility] here for primary care 2. Dr. Lorin Picket, eye doctor, last visit 01/2015 3. Dr. Ladona Ridgel, dentist, last visit 2017 Patient Care Team: Lucky Cowboy, MD as PCP - General (Internal Medicine) Hilarie Fredrickson, MD as Consulting Physician (Gastroenterology) Kathleene Hazel, MD as Consulting Physician (Cardiology)  Surgical: He  has a past surgical history that includes achillis tendon; Hip Arthroplasty; Total hip arthroplasty (08/24/2011); TEE without cardioversion (N/A, 07/04/2012); Cardiac catheterization; Mitral valve repair (Right, 08/15/2012); Intraoprative transesophageal echocardiogram (N/A, 08/15/2012); Colonoscopy; Colonoscopy w/ biopsies and polypectomy; Inguinal hernia repair (Right, 11/07/2015); and Insertion of mesh (Right, 11/07/2015). Family His family history  includes Diabetes in his brother and sister; Heart attack in his father; Heart attack (age of onset: 88) in his brother; Hyperlipidemia in his sister; Hypertension in his brother, father, and sister. Social history  He reports that he quit smoking about 42 years ago. His smoking use included Cigars. His smokeless tobacco use includes Chew. He reports that he does not drink alcohol or use drugs.  MEDICARE WELLNESS OBJECTIVES: Physical activity: Current Exercise Habits: The patient does not participate in regular exercise at present Cardiac risk factors: Cardiac Risk Factors include: advanced age (>24men, >65 women);diabetes mellitus;dyslipidemia;hypertension;male gender;sedentary lifestyle;obesity (BMI >30kg/m2);family history of premature cardiovascular disease Depression/mood screen:   Depression screen [facility] 2/9 02/23/2016  Decreased Interest 0  Down, Depressed, Hopeless 0  PHQ - 2 Score 0  Some recent data might be hidden    ADLs:  In your present state of health, do you have any difficulty performing the following activities: 02/23/2016 11/04/2015  Hearing? N N  Vision? N N  Difficulty concentrating or making decisions? N -  Walking or climbing stairs? N Y  Dressing or bathing? N N  Doing errands, shopping? N N  Some recent data might be hidden     Cognitive Testing  Alert? Yes  Normal Appearance?Yes  Oriented to person? Yes  Place? Yes   Time? Yes  Recall of three objects?  Yes  Can perform simple calculations? Yes  Displays appropriate judgment?Yes  Can read the correct time from a watch face?Yes  EOL planning: Does Patient Have a Medical Advance Directive?: No Would patient like information on creating a medical advance directive?: Yes (ED - Information included in AVS)   Objective:   Today's Vitals   02/23/16 0938  BP: 126/74  Pulse: 66  Resp: 14  Temp: 97.7 F (36.5 C)  SpO2: 99%  Weight: 224 lb 9.6 oz (101.9 kg)  Height: 6' (1.829 m)  PainSc: 7   PainLoc: Hip    Body mass index is 30.46 kg/m.  General appearance: alert, no distress, WD/WN, male HEENT: normocephalic, sclerae anicteric, TMs pearly, nares patent, no discharge or erythema, pharynx normal Oral cavity: MMM, no lesions Neck: supple, no lymphadenopathy, no thyromegaly, no masses Heart: RRR, normal S1, S2, no murmurs Lungs: CTA bilaterally, no wheezes, rhonchi, or rales Abdomen: +bs, soft, non tender, non distended, no masses, no hepatomegaly, no splenomegaly Musculoskeletal: nontender, no swelling, no obvious deformity Extremities: no edema, no cyanosis, no clubbing Pulses: 2+ symmetric, upper and lower extremities, normal cap refill Neurological: alert, oriented x 3, CN2-12 intact, strength normal upper extremities and lower extremities, sensation normal throughout, DTRs 2+ throughout, no cerebellar signs, gait normal Psychiatric: normal affect, behavior normal, pleasant   Medicare Attestation I have personally reviewed: The patient's medical and social history  Their use of alcohol, tobacco or illicit drugs Their current medications and supplements The patient's functional ability including ADLs,fall risks, home safety risks, cognitive, and hearing and visual impairment Diet and physical activities Evidence for depression or mood disorders  The patient's weight, height, BMI, and visual acuity have been recorded in the chart.  I have made referrals, counseling, and provided education to the patient based on review of the above and I have provided the patient with a written personalized care plan for preventive services.     Quentin Mullingmanda Collier, PA-C   02/23/2016

## 2016-02-24 ENCOUNTER — Telehealth: Payer: Self-pay | Admitting: *Deleted

## 2016-02-24 LAB — VITAMIN D 25 HYDROXY (VIT D DEFICIENCY, FRACTURES): VIT D 25 HYDROXY: 54 ng/mL (ref 30–100)

## 2016-02-24 NOTE — Telephone Encounter (Signed)
Pt called stating started Prednisone taper last night  Prednisone 20 mg take 3 tabs 02/23/2016 and then take 2 tabs daily for 4 days Pt instructed to take coumadin as ordered and Prednisone as ordered and made appt for recheck in coumadin clinic on Thursday Dec 7th and instructed to eat serving of greens each day when takes Prednisone and he states understanding

## 2016-02-25 ENCOUNTER — Ambulatory Visit (HOSPITAL_COMMUNITY)
Admission: RE | Admit: 2016-02-25 | Discharge: 2016-02-25 | Disposition: A | Discharge status: Home / Self Care | Payer: 59 | Source: Ambulatory Visit | Attending: Physician Assistant | Admitting: Physician Assistant

## 2016-02-25 DIAGNOSIS — I10 Essential (primary) hypertension: Secondary | ICD-10-CM | POA: Diagnosis not present

## 2016-02-25 DIAGNOSIS — F17200 Nicotine dependence, unspecified, uncomplicated: Secondary | ICD-10-CM | POA: Insufficient documentation

## 2016-02-26 ENCOUNTER — Ambulatory Visit: Payer: Self-pay | Admitting: Physician Assistant

## 2016-02-26 ENCOUNTER — Ambulatory Visit (INDEPENDENT_AMBULATORY_CARE_PROVIDER_SITE_OTHER): Payer: 59 | Admitting: Pharmacist

## 2016-02-26 DIAGNOSIS — I2510 Atherosclerotic heart disease of native coronary artery without angina pectoris: Secondary | ICD-10-CM

## 2016-02-26 DIAGNOSIS — I2699 Other pulmonary embolism without acute cor pulmonale: Secondary | ICD-10-CM | POA: Diagnosis not present

## 2016-02-26 DIAGNOSIS — Z9889 Other specified postprocedural states: Secondary | ICD-10-CM

## 2016-02-26 DIAGNOSIS — Z7901 Long term (current) use of anticoagulants: Secondary | ICD-10-CM | POA: Diagnosis not present

## 2016-02-26 LAB — POCT INR: INR: 3.2

## 2016-03-02 MED FILL — FENOFIBRATE 134 MG CAPSULE: 134 | 90 days supply | Qty: 90 | Fill #1

## 2016-03-18 ENCOUNTER — Ambulatory Visit (INDEPENDENT_AMBULATORY_CARE_PROVIDER_SITE_OTHER): Payer: 59 | Admitting: *Deleted

## 2016-03-18 DIAGNOSIS — I2699 Other pulmonary embolism without acute cor pulmonale: Secondary | ICD-10-CM

## 2016-03-18 DIAGNOSIS — Z7901 Long term (current) use of anticoagulants: Secondary | ICD-10-CM | POA: Diagnosis not present

## 2016-03-18 DIAGNOSIS — Z9889 Other specified postprocedural states: Secondary | ICD-10-CM

## 2016-03-18 DIAGNOSIS — I2510 Atherosclerotic heart disease of native coronary artery without angina pectoris: Secondary | ICD-10-CM

## 2016-03-18 LAB — POCT INR: INR: 2.7

## 2016-03-24 ENCOUNTER — Emergency Department (HOSPITAL_COMMUNITY)
Admission: EM | Admit: 2016-03-24 | Discharge: 2016-03-24 | Disposition: A | Discharge status: Home / Self Care | Payer: No Typology Code available for payment source | Attending: Physician Assistant | Admitting: Physician Assistant

## 2016-03-24 ENCOUNTER — Emergency Department (HOSPITAL_COMMUNITY): Payer: No Typology Code available for payment source

## 2016-03-24 ENCOUNTER — Encounter (HOSPITAL_COMMUNITY): Payer: Self-pay | Admitting: Emergency Medicine

## 2016-03-24 DIAGNOSIS — I129 Hypertensive chronic kidney disease with stage 1 through stage 4 chronic kidney disease, or unspecified chronic kidney disease: Secondary | ICD-10-CM | POA: Insufficient documentation

## 2016-03-24 DIAGNOSIS — Y939 Activity, unspecified: Secondary | ICD-10-CM | POA: Diagnosis not present

## 2016-03-24 DIAGNOSIS — S0990XA Unspecified injury of head, initial encounter: Secondary | ICD-10-CM | POA: Diagnosis not present

## 2016-03-24 DIAGNOSIS — I2510 Atherosclerotic heart disease of native coronary artery without angina pectoris: Secondary | ICD-10-CM | POA: Diagnosis not present

## 2016-03-24 DIAGNOSIS — Y92410 Unspecified street and highway as the place of occurrence of the external cause: Secondary | ICD-10-CM | POA: Insufficient documentation

## 2016-03-24 DIAGNOSIS — E1122 Type 2 diabetes mellitus with diabetic chronic kidney disease: Secondary | ICD-10-CM | POA: Diagnosis not present

## 2016-03-24 DIAGNOSIS — S0003XA Contusion of scalp, initial encounter: Secondary | ICD-10-CM | POA: Diagnosis not present

## 2016-03-24 DIAGNOSIS — Z96641 Presence of right artificial hip joint: Secondary | ICD-10-CM | POA: Insufficient documentation

## 2016-03-24 DIAGNOSIS — Z87891 Personal history of nicotine dependence: Secondary | ICD-10-CM | POA: Insufficient documentation

## 2016-03-24 DIAGNOSIS — M542 Cervicalgia: Secondary | ICD-10-CM | POA: Diagnosis not present

## 2016-03-24 DIAGNOSIS — Y999 Unspecified external cause status: Secondary | ICD-10-CM | POA: Insufficient documentation

## 2016-03-24 DIAGNOSIS — N183 Chronic kidney disease, stage 3 (moderate): Secondary | ICD-10-CM | POA: Insufficient documentation

## 2016-03-24 DIAGNOSIS — S199XXA Unspecified injury of neck, initial encounter: Secondary | ICD-10-CM | POA: Diagnosis not present

## 2016-03-24 DIAGNOSIS — Z7901 Long term (current) use of anticoagulants: Secondary | ICD-10-CM | POA: Insufficient documentation

## 2016-03-24 NOTE — ED Notes (Addendum)
C- collar removed by NP , results and plan of care explained to pt. by EDP.
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
## 2016-03-24 NOTE — ED Notes (Signed)
Patient transported to CT SCAN . 
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
## 2016-04-01 MED FILL — BENAZEPRIL HCL 20 MG TABLET: 20 | 90 days supply | Qty: 90 | Fill #2

## 2016-04-08 ENCOUNTER — Ambulatory Visit: Payer: Self-pay | Admitting: Internal Medicine

## 2016-04-12 MED FILL — EZETIMIBE 10 MG TABLET: 10 | 90 days supply | Qty: 90 | Fill #2

## 2016-04-15 ENCOUNTER — Ambulatory Visit (INDEPENDENT_AMBULATORY_CARE_PROVIDER_SITE_OTHER): Payer: 59 | Admitting: *Deleted

## 2016-04-15 DIAGNOSIS — Z9889 Other specified postprocedural states: Secondary | ICD-10-CM

## 2016-04-15 DIAGNOSIS — Z7901 Long term (current) use of anticoagulants: Secondary | ICD-10-CM

## 2016-04-15 DIAGNOSIS — I2699 Other pulmonary embolism without acute cor pulmonale: Secondary | ICD-10-CM

## 2016-04-15 LAB — POCT INR: INR: 3.1

## 2016-04-20 DIAGNOSIS — H524 Presbyopia: Secondary | ICD-10-CM | POA: Diagnosis not present

## 2016-04-20 DIAGNOSIS — E113293 Type 2 diabetes mellitus with mild nonproliferative diabetic retinopathy without macular edema, bilateral: Secondary | ICD-10-CM | POA: Diagnosis not present

## 2016-04-26 MED FILL — WARFARIN SODIUM 5 MG TABLET: 5 | 90 days supply | Qty: 180 | Fill #1

## 2016-05-03 ENCOUNTER — Ambulatory Visit (INDEPENDENT_AMBULATORY_CARE_PROVIDER_SITE_OTHER): Payer: 59 | Admitting: Internal Medicine

## 2016-05-03 ENCOUNTER — Encounter: Payer: Self-pay | Admitting: Internal Medicine

## 2016-05-03 VITALS — BP 112/60 | HR 58 | Temp 98.2°F | Resp 16 | Ht 72.0 in | Wt 225.0 lb

## 2016-05-03 DIAGNOSIS — S39012A Strain of muscle, fascia and tendon of lower back, initial encounter: Secondary | ICD-10-CM

## 2016-05-03 MED ORDER — TRAMADOL HCL 50 MG PO TABS: 50.0000 mg | ORAL_TABLET | Freq: Four times a day (QID) | ORAL | 0 refills | Status: DC | PRN

## 2016-05-03 MED ORDER — PREDNISONE 20 MG PO TABS: ORAL_TABLET | ORAL | 0 refills | Status: DC

## 2016-05-03 MED FILL — traMADol HCL 50 MG TABS: 50 | 15 days supply | Qty: 60 | Fill #0

## 2016-05-03 MED FILL — predniSONE 20 MG TABS: 20 | 15 days supply | Qty: 27 | Fill #0

## 2016-05-03 NOTE — Patient Instructions (Signed)
Lumbosacral Strain Lumbosacral strain is an injury that causes pain in the lower back (lumbosacral spine). This injury usually occurs from overstretching the muscles or ligaments along your spine. A strain can affect one or more muscles or cord-like tissues that connect bones to other bones (ligaments). What are the causes? This condition may be caused by:  A hard, direct hit (blow) to the back.  Excessive stretching of the lower back muscles. This may result from: ? A fall. ? Lifting something heavy. ? Repetitive movements such as bending or crouching.  What increases the risk? The following factors may increase your risk of getting this condition:  Participating in sports or activities that involve: ? A sudden twist of the back. ? Pushing or pulling motions.  Being overweight or obese.  Having poor strength and flexibility, especially tight hamstrings or weak muscles in the back or abdomen.  Having too much of a curve in the lower back.  Having a pelvis that is tilted forward.  What are the signs or symptoms? The main symptom of this condition is pain in the lower back, at the site of the strain. Pain may extend (radiate) down one or both legs. How is this diagnosed? This condition is diagnosed based on:  Your symptoms.  Your medical history.  A physical exam. ? Your health care provider may push on certain areas of your back to determine the source of your pain. ? You may be asked to bend forward, backward, and side to side to assess the severity of your pain and your range of motion.  Imaging tests, such as: ? X-rays. ? MRI.  How is this treated? Treatment for this condition may include:  Putting heat and cold on the affected area.  Medicines to help relieve pain and relax your muscles (muscle relaxants).  NSAIDs to help reduce swelling and discomfort.  When your symptoms improve, it is important to gradually return to your normal routine as soon as possible  to reduce pain, avoid stiffness, and avoid loss of muscle strength. Generally, symptoms should improve within 6 weeks of treatment. However, recovery time varies. Follow these instructions at home: Managing pain, stiffness, and swelling   If directed, put ice on the injured area during the first 24 hours after your strain. ? Put ice in a plastic bag. ? Place a towel between your skin and the bag. ? Leave the ice on for 20 minutes, 2-3 times a day.  If directed, put heat on the affected area as often as told by your health care provider. Use the heat source that your health care provider recommends, such as a moist heat pack or a heating pad. ? Place a towel between your skin and the heat source. ? Leave the heat on for 20-30 minutes. ? Remove the heat if your skin turns bright red. This is especially important if you are unable to feel pain, heat, or cold. You may have a greater risk of getting burned. Activity  Rest and return to your normal activities as told by your health care provider. Ask your health care provider what activities are safe for you.  Avoid activities that take a lot of energy for as long as told by your health care provider. General instructions  Take over-the-counter and prescription medicines only as told by your health care provider.  Donot drive or use heavy machinery while taking prescription pain medicine.  Do not use any products that contain nicotine or tobacco, such as cigarettes and e-cigarettes.   If you need help quitting, ask your health care provider.  Keep all follow-up visits as told by your health care provider. This is important. How is this prevented?  Use correct form when playing sports and lifting heavy objects.  Use good posture when sitting and standing.  Maintain a healthy weight.  Sleep on a mattress with medium firmness to support your back.  Be safe and responsible while being active to avoid falls.  Do at least 150 minutes of  moderate-intensity exercise each week, such as brisk walking or water aerobics. Try a form of exercise that takes stress off your back, such as swimming or stationary cycling.  Maintain physical fitness, including: ? Strength. ? Flexibility. ? Cardiovascular fitness. ? Endurance. Contact a health care provider if:  Your back pain does not improve after 6 weeks of treatment.  Your symptoms get worse. Get help right away if:  Your back pain is severe.  You cannot stand or walk.  You have difficulty controlling when you urinate or when you have a bowel movement.  You feel nauseous or you vomit.  Your feet get very cold.  You have numbness, tingling, weakness, or problems using your arms or legs.  You develop any of the following: ? Shortness of breath. ? Dizziness. ? Pain in your legs. ? Weakness in your buttocks or legs. ? Discoloration of the skin on your toes or legs. This information is not intended to replace advice given to you by your health care provider. Make sure you discuss any questions you have with your health care provider. Document Released: 12/16/2004 Document Revised: 09/26/2015 Document Reviewed: 08/10/2015 Elsevier Interactive Patient Education  2017 [employer]  
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
## 2016-05-07 ENCOUNTER — Ambulatory Visit (INDEPENDENT_AMBULATORY_CARE_PROVIDER_SITE_OTHER): Payer: 59 | Admitting: *Deleted

## 2016-05-07 DIAGNOSIS — I2699 Other pulmonary embolism without acute cor pulmonale: Secondary | ICD-10-CM

## 2016-05-07 DIAGNOSIS — Z9889 Other specified postprocedural states: Secondary | ICD-10-CM

## 2016-05-07 DIAGNOSIS — Z7901 Long term (current) use of anticoagulants: Secondary | ICD-10-CM

## 2016-05-07 LAB — POCT INR: INR: 3.2

## 2016-05-13 ENCOUNTER — Ambulatory Visit (INDEPENDENT_AMBULATORY_CARE_PROVIDER_SITE_OTHER): Payer: 59 | Admitting: *Deleted

## 2016-05-13 DIAGNOSIS — I2699 Other pulmonary embolism without acute cor pulmonale: Secondary | ICD-10-CM | POA: Diagnosis not present

## 2016-05-13 DIAGNOSIS — Z9889 Other specified postprocedural states: Secondary | ICD-10-CM | POA: Diagnosis not present

## 2016-05-13 DIAGNOSIS — Z7901 Long term (current) use of anticoagulants: Secondary | ICD-10-CM

## 2016-05-13 LAB — POCT INR: INR: 2.5

## 2016-05-27 ENCOUNTER — Ambulatory Visit (INDEPENDENT_AMBULATORY_CARE_PROVIDER_SITE_OTHER): Payer: 59 | Admitting: *Deleted

## 2016-05-27 DIAGNOSIS — Z9889 Other specified postprocedural states: Secondary | ICD-10-CM

## 2016-05-27 DIAGNOSIS — I2699 Other pulmonary embolism without acute cor pulmonale: Secondary | ICD-10-CM | POA: Diagnosis not present

## 2016-05-27 DIAGNOSIS — Z7901 Long term (current) use of anticoagulants: Secondary | ICD-10-CM | POA: Diagnosis not present

## 2016-05-27 LAB — POCT INR: INR: 3.4

## 2016-05-31 MED FILL — FENOFIBRATE 134 MG CAPSULE: 134 | 90 days supply | Qty: 90 | Fill #2

## 2016-06-07 ENCOUNTER — Ambulatory Visit (INDEPENDENT_AMBULATORY_CARE_PROVIDER_SITE_OTHER): Payer: 59 | Admitting: Internal Medicine

## 2016-06-07 ENCOUNTER — Encounter: Payer: Self-pay | Admitting: Internal Medicine

## 2016-06-07 VITALS — BP 128/84 | HR 64 | Temp 97.3°F | Resp 16 | Ht 72.0 in | Wt 225.6 lb

## 2016-06-07 DIAGNOSIS — E559 Vitamin D deficiency, unspecified: Secondary | ICD-10-CM | POA: Diagnosis not present

## 2016-06-07 DIAGNOSIS — E1122 Type 2 diabetes mellitus with diabetic chronic kidney disease: Secondary | ICD-10-CM | POA: Diagnosis not present

## 2016-06-07 DIAGNOSIS — I10 Essential (primary) hypertension: Secondary | ICD-10-CM

## 2016-06-07 DIAGNOSIS — E782 Mixed hyperlipidemia: Secondary | ICD-10-CM

## 2016-06-07 DIAGNOSIS — N183 Chronic kidney disease, stage 3 (moderate): Secondary | ICD-10-CM

## 2016-06-07 DIAGNOSIS — Z79899 Other long term (current) drug therapy: Secondary | ICD-10-CM

## 2016-06-07 LAB — CBC WITH DIFFERENTIAL/PLATELET
BASOS ABS: 40 {cells}/uL (ref 0–200)
Basophils Relative: 1 %
EOS ABS: 120 {cells}/uL (ref 15–500)
Eosinophils Relative: 3 %
HCT: 45.3 % (ref 38.5–50.0)
HEMOGLOBIN: 15.4 g/dL (ref 13.2–17.1)
LYMPHS ABS: 1800 {cells}/uL (ref 850–3900)
Lymphocytes Relative: 45 %
MCH: 30.9 pg (ref 27.0–33.0)
MCHC: 34 g/dL (ref 32.0–36.0)
MCV: 90.8 fL (ref 80.0–100.0)
MPV: 11.5 fL (ref 7.5–12.5)
Monocytes Absolute: 280 cells/uL (ref 200–950)
Monocytes Relative: 7 %
NEUTROS ABS: 1760 {cells}/uL (ref 1500–7800)
Neutrophils Relative %: 44 %
PLATELETS: 280 10*3/uL (ref 140–400)
RBC: 4.99 MIL/uL (ref 4.20–5.80)
RDW: 14.4 % (ref 11.0–15.0)
WBC: 4 10*3/uL (ref 3.8–10.8)

## 2016-06-07 LAB — BASIC METABOLIC PANEL WITH GFR
BUN: 20 mg/dL (ref 7–25)
CO2: 27 mmol/L (ref 20–31)
CREATININE: 1.53 mg/dL — AB (ref 0.70–1.25)
Calcium: 10.1 mg/dL (ref 8.6–10.3)
Chloride: 102 mmol/L (ref 98–110)
GFR, Est African American: 53 mL/min — ABNORMAL LOW (ref >=60)
GFR, Est Non African American: 46 mL/min — ABNORMAL LOW (ref >=60)
Glucose, Bld: 85 mg/dL (ref 65–99)
Potassium: 4.3 mmol/L (ref 3.5–5.3)
Sodium: 139 mmol/L (ref 135–146)

## 2016-06-07 LAB — LIPID PANEL
Cholesterol: 197 mg/dL (ref <200)
HDL: 58 mg/dL (ref >40)
LDL Cholesterol: 119 mg/dL — ABNORMAL HIGH (ref <100)
Total CHOL/HDL Ratio: 3.4 Ratio (ref <5.0)
Triglycerides: 99 mg/dL (ref <150)
VLDL: 20 mg/dL (ref <30)

## 2016-06-07 LAB — HEPATIC FUNCTION PANEL
ALBUMIN: 4.9 g/dL (ref 3.6–5.1)
ALT: 19 U/L (ref 9–46)
AST: 30 U/L (ref 10–35)
Alkaline Phosphatase: 50 U/L (ref 40–115)
BILIRUBIN TOTAL: 0.6 mg/dL (ref 0.2–1.2)
Bilirubin, Direct: 0.1 mg/dL (ref <=0.2)
Indirect Bilirubin: 0.5 mg/dL (ref 0.2–1.2)
Total Protein: 7.4 g/dL (ref 6.1–8.1)

## 2016-06-07 LAB — TSH: TSH: 1.87 mIU/L (ref 0.40–4.50)

## 2016-06-07 LAB — MAGNESIUM: MAGNESIUM: 1.9 mg/dL (ref 1.5–2.5)

## 2016-06-07 NOTE — Progress Notes (Signed)
This very nice 71 y.o. MBM presents for 3 month follow up with Hypertension, Hyperlipidemia, Pre-Diabetes and Vitamin D Deficiency.      Patient is treated for HTN (2005)  & BP has been controlled at home. Today's BP is at goal - 128/84.  In 2009, he had a negative Heart Cath by Dr Sanjuana Kava.  In 2014 , he had a Mitral Vulvoplasty by Dr Barry Dienes.  He has hx/o PE after Orthopedic surgery in 2010 and had a 2sd PE in 2012 and has been on Coumadin since.  Patient has had no complaints of any cardiac type chest pain, palpitations, dyspnea/orthopnea/PND, dizziness, claudication, or dependent edema.     Hyperlipidemia is controlled with diet & meds. Patient denies myalgias or other med SE's. Last Lipids were at goal: Lab Results  Component Value Date   CHOL 160 02/23/2016   HDL 52 02/23/2016   LDLCALC 94 02/23/2016   TRIG 72 02/23/2016   CHOLHDL 3.1 02/23/2016      Also, the patient has history of  Diet controlled T2_NIDDM (2002) and has hx/o CKD3  (GFR 46 ml/min)  and has had no symptoms of reactive hypoglycemia, diabetic polys, paresthesias or visual blurring.  Last A1c was not at goal: Lab Results  Component Value Date   HGBA1C 6.3 (H) 02/23/2016      Further, the patient also has history of Vitamin D Deficiency and supplements vitamin D without any suspected side-effects. Last vitamin D was still low (goal 70-100):  Lab Results  Component Value Date   VD25OH 54 02/23/2016   Current Outpatient Prescriptions on File Prior to Visit  Medication Sig  . acetaminophen (TYLENOL) 500 MG tablet Take 500 mg by mouth every 6 (six) hours as needed for pain.  . Cholecalciferol (VITAMIN D PO) Take by mouth daily. TAKING 5,000 UNITS DAILY  . fenofibrate micronized (LOFIBRA) 134 MG capsule TAKE 1 CAPSULE BY MOUTH EVERY EVENING.  Marland Kitchen loratadine (CLARITIN) 10 MG tablet Take 10 mg by mouth daily as needed for allergies.  . mometasone (NASONEX) 50 MCG/ACT nasal spray Place 2 sprays into the nose daily as  needed (allergies).   . Psyllium ([employer] NATURAL DAILY FIBER PO) Take 1 tablet by mouth daily.  . traMADol (ULTRAM) 50 MG tablet Take 1 tablet (50 mg total) by mouth every 6 (six) hours as needed.  . warfarin (COUMADIN) 5 MG tablet TAKE AS DIRECTED BY COUMADIN CLINIC.  Marland Kitchen ezetimibe (ZETIA) 10 MG tablet Take 1 tablet daily for Cholesterol   No current facility-administered medications on file prior to visit.    Allergies  Allergen Reactions  . Statins     REACTION: muscle aches  . Welchol [Colesevelam Hcl]    PMHx:   Past Medical History:  Diagnosis Date  . Allergy    SEASONAL  . Cataract    SMALL IN LEFT EYE  . Coronary artery disease    non-obstructive by cath 2009  . Degenerative joint disease   . Heart murmur   . Hyperlipidemia   . Hypertension    Clearance with note Dr Marvel Plan on chart,  Lovonox bridging  ordered by Dr Sanjuana Kava    EKG 9/12, chst 9/12 EPIC  . Inguinal hernia    right  . Osteoarthritis   . Peripheral vascular disease (HCC)   . Pre-diabetes   . Pulmonary embolism [facility])    march 2010, 2012              /   DVT  x  2  LEFT 2010  . S/P mitral valve repair 08/15/2012   Complex valvuloplasty including triangular resection of posterior leaflet, artificial Gore-tex neocord placement x4 and Sorin Memo 3D ring annuloplasty via right mini thoracotomy approach  . Seasonal allergies   . Severe mitral regurgitation by prior echocardiogram   . Sleep apnea    STOP BANG SCORE 4  . Vitamin D deficiency   . Wears glasses    Immunization History  Administered Date(s) Administered  . Influenza, High Dose Seasonal PF 12/20/2013, 01/14/2015, 01/06/2016  . Influenza-Unspecified 01/20/2013  . Pneumococcal Conjugate-13 02/23/2016  . Pneumococcal-Unspecified 05/11/2012  . Td 11/22/2003   Past Surgical History:  Procedure Laterality Date  . achillis tendon     repair 20 years ago  . CARDIAC CATHETERIZATION    . COLONOSCOPY    . COLONOSCOPY W/ BIOPSIES AND POLYPECTOMY    .  HIP ARTHROPLASTY     11/06/07  . INGUINAL HERNIA REPAIR Right 11/07/2015   Procedure: LAPAROSCOPIC RIGHT INGUINAL HERNIA WITH MESH;  Surgeon: Axel Filler, MD;  Location: [facility] OR;  Service: General;  Laterality: Right;  . INSERTION OF MESH Right 11/07/2015   Procedure: INSERTION OF MESH;  Surgeon: Axel Filler, MD;  Location[facility] OR;  Service: General;  Laterality: Right;  . INTRAOPERATIVE TRANSESOPHAGEAL ECHOCARDIOGRAM N/A 08/15/2012   Procedure: INTRAOPERATIVE TRANSESOPHAGEAL ECHOCARDIOGRAM;  Surgeon: Purcell Nails, MD;  Location: [facility] OR;  Service: Open Heart Surgery;  Laterality: N/A;  . MITRAL VALVE REPAIR Right 08/15/2012   Procedure: MINIMALLY INVASIVE MITRAL VALVE REPAIR (MVR);  Surgeon: Purcell Nails, MD;  Location: [facility] OR;  Service: Open Heart Surgery;  Laterality: Right;  . TEE WITHOUT CARDIOVERSION N/A 07/04/2012   Procedure: TRANSESOPHAGEAL ECHOCARDIOGRAM (TEE);  Surgeon: Laurey Morale, MD;  Location: [facility] ENDOSCOPY;  Service: Cardiovascular;  Laterality: N/A;  . TOTAL HIP ARTHROPLASTY  08/24/2011   Procedure: TOTAL HIP ARTHROPLASTY ANTERIOR APPROACH;  Surgeon: Shelda Pal, MD;  Location: [facility] ORS;  Service: Orthopedics;  Laterality: Right;   FHx:    Reviewed / unchanged  SHx:    Reviewed / unchanged  Systems Review:  Constitutional: Denies fever, chills, wt changes, headaches, insomnia, fatigue, night sweats, change in appetite. Eyes: Denies redness, blurred vision, diplopia, discharge, itchy, watery eyes.  ENT: Denies discharge, congestion, post nasal drip, epistaxis, sore throat, earache, hearing loss, dental pain, tinnitus, vertigo, sinus pain, snoring.  CV: Denies chest pain, palpitations, irregular heartbeat, syncope, dyspnea, diaphoresis, orthopnea, PND, claudication or edema. Respiratory: denies cough, dyspnea, DOE, pleurisy, hoarseness, laryngitis, wheezing.  Gastrointestinal: Denies dysphagia, odynophagia, heartburn, reflux, water brash, abdominal pain or cramps, nausea,  vomiting, bloating, diarrhea, constipation, hematemesis, melena, hematochezia  or hemorrhoids. Genitourinary: Denies dysuria, frequency, urgency, nocturia, hesitancy, discharge, hematuria or flank pain. Musculoskeletal: Denies arthralgias, myalgias, stiffness, jt. swelling, pain, limping or strain/sprain.  Skin: Denies pruritus, rash, hives, warts, acne, eczema or change in skin lesion(s). Neuro: No weakness, tremor, incoordination, spasms, paresthesia or pain. Psychiatric: Denies confusion, memory loss or sensory loss. Endo: Denies change in weight, skin or hair change.  Heme/Lymph: No excessive bleeding, bruising or enlarged lymph nodes.  Physical Exam  BP 128/84   Pulse 64   Temp 97.3 F (36.3 C)   Resp 16   Ht 6' (1.829 m)   Wt 225 lb 9.6 oz (102.3 kg)   BMI 30.60 kg/m   Appears well nourished and in no distress.  Eyes: PERRLA, EOMs, conjunctiva no swelling or erythema. Sinuses: No frontal/maxillary tenderness ENT/Mouth: EAC's clear,  TM's nl w/o erythema, bulging. Nares clear w/o erythema, swelling, exudates. Oropharynx clear without erythema or exudates. Oral hygiene is good. Tongue normal, non obstructing. Hearing intact.  Neck: Supple. Thyroid nl. Car 2+/2+ without bruits, nodes or JVD. Chest: Respirations nl with BS clear & equal w/o rales, rhonchi, wheezing or stridor.  Cor: Heart sounds normal w/ regular rate and rhythm without sig. murmurs, gallops, clicks, or rubs. Peripheral pulses normal and equal  without edema.  Abdomen: Soft & bowel sounds normal. Non-tender w/o guarding, rebound, hernias, masses, or organomegaly.  Lymphatics: Unremarkable.  Musculoskeletal: Full ROM all peripheral extremities, joint stability, 5/5 strength, and normal gait.  Skin: Warm, dry without exposed rashes, lesions or ecchymosis apparent.  Neuro: Cranial nerves intact, reflexes equal bilaterally. Sensory-motor testing grossly intact. Tendon reflexes grossly intact.  Pysch: Alert & oriented x  3.  Insight and judgement nl & appropriate. No ideations.  Assessment and Plan:   1. Essential hypertension  - Continue medication, monitor blood pressure at home.  - Continue DASH diet. Reminder to go to the ER if any CP,  SOB, nausea, dizziness, severe HA, changes vision/speech,  left arm numbness and tingling and jaw pain.  - CBC with Differential/Platelet - BASIC METABOLIC PANEL WITH GFR - Magnesium - TSH  2. Mixed hyperlipidemia  - Continue diet/meds, exercise,& lifestyle modifications.  - Continue monitor periodic cholesterol/liver & renal functions   - Hepatic function panel - Lipid panel - TSH  3. T2_NIDDM w/CKD2 (HCC)  - Continue diet, exercise, lifestyle modifications.  - Monitor appropriate labs.  - Hemoglobin A1c - Insulin, random  4. Vitamin D deficiency  - Continue supplementation. - VITAMIN D 25 Hydroxy   5. Medication management  - CBC with Differential/Platelet - BASIC METABOLIC PANEL WITH GFR - Hepatic function panel - Magnesium - Lipid panel - TSH - Hemoglobin A1c - Insulin, random - VITAMIN D 25 Hydroxy       Recommended regular exercise, BP monitoring, weight control, and discussed med and SE's. Recommended labs to assess and monitor clinical status. Further disposition pending results of labs. Over 30 minutes of exam, counseling, chart review was performed

## 2016-06-07 NOTE — Patient Instructions (Signed)

## 2016-06-08 LAB — HEMOGLOBIN A1C
Hgb A1c MFr Bld: 6.4 % — ABNORMAL HIGH (ref <5.7)
Mean Plasma Glucose: 137 mg/dL

## 2016-06-08 LAB — VITAMIN D 25 HYDROXY (VIT D DEFICIENCY, FRACTURES): Vit D, 25-Hydroxy: 47 ng/mL (ref 30–100)

## 2016-06-08 LAB — INSULIN, RANDOM: Insulin: 7.2 u[IU]/mL (ref 2.0–19.6)

## 2016-06-16 ENCOUNTER — Ambulatory Visit (INDEPENDENT_AMBULATORY_CARE_PROVIDER_SITE_OTHER): Payer: 59 | Admitting: *Deleted

## 2016-06-16 DIAGNOSIS — Z9889 Other specified postprocedural states: Secondary | ICD-10-CM

## 2016-06-16 DIAGNOSIS — Z7901 Long term (current) use of anticoagulants: Secondary | ICD-10-CM | POA: Diagnosis not present

## 2016-06-16 DIAGNOSIS — I2699 Other pulmonary embolism without acute cor pulmonale: Secondary | ICD-10-CM | POA: Diagnosis not present

## 2016-06-16 LAB — POCT INR: INR: 2.6

## 2016-07-13 MED FILL — EZETIMIBE 10 MG TAB: 10 | 90 days supply | Qty: 90 | Fill #3

## 2016-07-14 ENCOUNTER — Ambulatory Visit (INDEPENDENT_AMBULATORY_CARE_PROVIDER_SITE_OTHER): Payer: 59 | Admitting: *Deleted

## 2016-07-14 DIAGNOSIS — Z7901 Long term (current) use of anticoagulants: Secondary | ICD-10-CM

## 2016-07-14 DIAGNOSIS — I2699 Other pulmonary embolism without acute cor pulmonale: Secondary | ICD-10-CM

## 2016-07-14 DIAGNOSIS — Z9889 Other specified postprocedural states: Secondary | ICD-10-CM

## 2016-07-14 LAB — POCT INR: INR: 3

## 2016-08-03 MED FILL — AMOXICILLIN 500 MG CAPSULE: 500 | 10 days supply | Qty: 30 | Fill #0

## 2016-08-06 ENCOUNTER — Telehealth: Payer: Self-pay | Admitting: *Deleted

## 2016-08-06 NOTE — Telephone Encounter (Signed)
Spoke with pt and he states was placed on Amoxicillin 500 mg TID for abscess tooth and started this antibiotic yesterday. Pt informed that there is no interaction between Amoxicillin and coumadin so continue to take coumadin as instructed and Amoxicillin as ordered and he states understanding

## 2016-08-10 NOTE — Progress Notes (Signed)
Chief Complaint  Patient presents with  . Follow-up    History of Present Illness:  71 yo male with history of HTN, recurrent pulmonary emboli on anticoagulation, mild nonobstructive CAD, non-ischemic cardiomyopathy and MV repair who is here today for cardiac follow up. Cardiac cath April 2014 with mild CAD. He had a hip replacement 2009 and presented with SOB March 2010 and was found to have bilateral pulmonary emboli with recurrence in 2012 off of anti-coagulation therapy. Cardiac monitor September 2014 in setting of dizziness showed sinus rhythm with PVCs. Echo May 2016 with LVEF=40%, stable mitral valve repair.   He is here today for follow up. The patient denies any chest pain, palpitations, lower extremity edema, orthopnea, PND, dizziness, near syncope or syncope. He has chronic dyspnea with exertion.   Primary Care Physician: Lucky Cowboy, MD  Past Medical History:  Diagnosis Date  . Allergy    SEASONAL  . Cataract    SMALL IN LEFT EYE  . Coronary artery disease    non-obstructive by cath 2009  . Degenerative joint disease   . Heart murmur   . Hyperlipidemia   . Hypertension    Clearance with note Dr Marvel Plan on chart,  Lovonox bridging  ordered by Dr Sanjuana Kava    EKG 9/12, chst 9/12 EPIC  . Inguinal hernia    right  . Osteoarthritis   . Peripheral vascular disease (HCC)   . Pre-diabetes   . Pulmonary embolism [facility])    march 2010, 2012              /   DVT x  2  LEFT 2010  . S/P mitral valve repair 08/15/2012   Complex valvuloplasty including triangular resection of posterior leaflet, artificial Gore-tex neocord placement x4 and Sorin Memo 3D ring annuloplasty via right mini thoracotomy approach  . Seasonal allergies   . Severe mitral regurgitation by prior echocardiogram   . Sleep apnea    STOP BANG SCORE 4  . Vitamin D deficiency   . Wears glasses     Past Surgical History:  Procedure Laterality Date  . achillis tendon     repair 20 years ago  . CARDIAC  CATHETERIZATION    . COLONOSCOPY    . COLONOSCOPY W/ BIOPSIES AND POLYPECTOMY    . HIP ARTHROPLASTY     11/06/07  . INGUINAL HERNIA REPAIR Right 11/07/2015   Procedure: LAPAROSCOPIC RIGHT INGUINAL HERNIA WITH MESH;  Surgeon: Axel Filler, MD;  Location: [facility] OR;  Service: General;  Laterality: Right;  . INSERTION OF MESH Right 11/07/2015   Procedure: INSERTION OF MESH;  Surgeon: Axel Filler, MD;  Location: [facility] OR;  Service: General;  Laterality: Right;  . INTRAOPERATIVE TRANSESOPHAGEAL ECHOCARDIOGRAM N/A 08/15/2012   Procedure: INTRAOPERATIVE TRANSESOPHAGEAL ECHOCARDIOGRAM;  Surgeon: Purcell Nails, MD;  Location: [facility] OR;  Service: Open Heart Surgery;  Laterality: N/A;  . MITRAL VALVE REPAIR Right 08/15/2012   Procedure: MINIMALLY INVASIVE MITRAL VALVE REPAIR (MVR);  Surgeon: Purcell Nails, MD;  Location: [facility] OR;  Service: Open Heart Surgery;  Laterality: Right;  . TEE WITHOUT CARDIOVERSION N/A 07/04/2012   Procedure: TRANSESOPHAGEAL ECHOCARDIOGRAM (TEE);  Surgeon: Laurey Morale, MD;  Location: [facility] ENDOSCOPY;  Service: Cardiovascular;  Laterality: N/A;  . TOTAL HIP ARTHROPLASTY  08/24/2011   Procedure: TOTAL HIP ARTHROPLASTY ANTERIOR APPROACH;  Surgeon: Shelda Pal, MD;  Location: [facility] ORS;  Service: Orthopedics;  Laterality: Right;    Current Outpatient Prescriptions  Medication Sig Dispense Refill  . acetaminophen (  TYLENOL) 500 MG tablet Take 500 mg by mouth every 6 (six) hours as needed for pain.    . benazepril (LOTENSIN) 20 MG tablet Take 20 mg by mouth daily.   3  . Cholecalciferol (VITAMIN D PO) Take by mouth daily. TAKING 5,000 UNITS DAILY    . CINNAMON PO Takes 2 tablets (2000mg  ) daily    . ezetimibe (ZETIA) 10 MG tablet Take 10 mg by mouth daily.    . fenofibrate micronized (LOFIBRA) 134 MG capsule TAKE 1 CAPSULE BY MOUTH EVERY EVENING. 90 capsule 3  . loratadine (CLARITIN) 10 MG tablet Take 10 mg by mouth daily as needed for allergies.    . mometasone (NASONEX) 50 MCG/ACT nasal  spray Place 2 sprays into the nose daily as needed (allergies).     . Psyllium ([employer] NATURAL DAILY FIBER PO) Take 1 tablet by mouth daily.    Marland Kitchen warfarin (COUMADIN) 5 MG tablet TAKE AS DIRECTED BY COUMADIN CLINIC. 180 tablet 1   No current facility-administered medications for this visit.     Allergies  Allergen Reactions  . Statins     REACTION: muscle aches  . Welchol [Colesevelam Hcl]     unknown    Social History   Social History  . Marital status: Married    Spouse name: N/A  . Number of children: 3  . Years of education: N/A   Occupational History  . 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 Smoking status: Former Smoker    Types: Cigars    Quit date: 03/22/1973  . Smokeless tobacco: Current User    Types: Chew     Comment: uses smokeless tobacco  . Alcohol use No  . Drug use: No  . Sexual activity: Not Currently   Other Topics Concern  . Not on file   Social History Narrative  . No narrative on file    Family History  Problem Relation Age of Onset  . Heart attack Father   . Hypertension Father   . Coronary artery disease Unknown   . Heart attack Brother 60  . Hypertension Sister   . Hypertension Brother   . Hyperlipidemia Sister   . Diabetes Sister   . Diabetes Brother     Review of Systems:  As stated in the HPI and otherwise negative.   BP 108/82   Pulse (!) 50   Ht 6' (1.829 m)   Wt 226 lb 12.8 oz (102.9 kg)   SpO2 98%   BMI 30.76 kg/m   Physical Examination: General: Well developed, well nourished, NAD  HEENT: OP clear, mucus membranes moist  SKIN: warm, dry. No rashes. Neuro: No focal deficits  Musculoskeletal: Muscle strength 5/5 all ext  Psychiatric: Mood and affect normal  Neck: No JVD, no carotid bruits, no thyromegaly, no lymphadenopathy.  Lungs:Clear bilaterally, no wheezes, rhonci, crackles Cardiovascular: Regular rate and rhythm. No murmurs, gallops or rubs. Abdomen:Soft. Bowel sounds  present. Non-tender.  Extremities: No lower extremity edema. Pulses are 2 + in the bilateral DP/PT.   Echo 08/13/14: Left ventricle: The cavity size was moderately dilated. Wall  thickness was normal. Systolic function was moderately reduced.  The estimated ejection fraction was in the range of 35% to 40%.  Diffuse hypokinesis. - Mitral valve: Intact MV repair with annuloplasty ring and trivial  residual MR. - Left atrium: The atrium was mildly dilated. - Atrial septum: No defect or patent foramen ovale was identified.  EKG:  EKG is  ordered today. The ekg ordered today demonstrates sinus, PVCs. Rate 78 bpm.     Recent Labs: 06/07/2016: ALT 19; BUN 20; Creat 1.53; Hemoglobin 15.4; Magnesium 1.9; Platelets 280; Potassium 4.3; Sodium 139; TSH 1.87   Lipid Panel    Component Value Date/Time   CHOL 197 06/07/2016 1157   TRIG 99 06/07/2016 1157   HDL 58 06/07/2016 1157   CHOLHDL 3.4 06/07/2016 1157   VLDL 20 06/07/2016 1157   LDLCALC 119 (H) 06/07/2016 1157     Wt Readings from Last 3 Encounters:  08/11/16 226 lb 12.8 oz (102.9 kg)  06/07/16 225 lb 9.6 oz (102.3 kg)  05/03/16 225 lb (102.1 kg)     Other studies Reviewed: Additional studies/ records that were reviewed today include:. Review of the above records demonstrates:    Assessment and Plan:   1. MITRAL REGURGITATION: he is s/p mitral valve repair. Trivial MR by echo may 2016. Will repeat echo in May 2019.   2. Recurrent DVT/PE: He is on coumadin .  3. CAD without angina: He has no chest pain suggestive of angina. No ASA since he is on coumadin. He is intolerant of statins.   4. Cardiomyopathy, non-ischemic: Continue Ace-inh. No beta blocker due to bradycardia.   Current medicines are reviewed at length with the patient today.  The patient does not have concerns regarding medicines.  The following changes have been made:  no change  Labs/ tests ordered today include:  Orders Placed This Encounter    Procedures  . EKG 12-Lead    Disposition:   FU with me in 12  months  Signed, Verne Carrow, MD 08/11/2016 8:56 AM    [facility] [address] [place], [place], [place]  [address] Phone: [phone]; Fax: [phone]

## 2016-08-11 ENCOUNTER — Ambulatory Visit (INDEPENDENT_AMBULATORY_CARE_PROVIDER_SITE_OTHER): Payer: 59 | Admitting: Cardiovascular Disease

## 2016-08-11 ENCOUNTER — Ambulatory Visit (INDEPENDENT_AMBULATORY_CARE_PROVIDER_SITE_OTHER): Payer: 59 | Admitting: *Deleted

## 2016-08-11 ENCOUNTER — Encounter: Payer: Self-pay | Admitting: Cardiovascular Disease

## 2016-08-11 VITALS — BP 108/82 | HR 50 | Ht 72.0 in | Wt 226.8 lb

## 2016-08-11 DIAGNOSIS — Z7901 Long term (current) use of anticoagulants: Secondary | ICD-10-CM | POA: Diagnosis not present

## 2016-08-11 DIAGNOSIS — Z9889 Other specified postprocedural states: Secondary | ICD-10-CM | POA: Diagnosis not present

## 2016-08-11 DIAGNOSIS — I428 Other cardiomyopathies: Secondary | ICD-10-CM | POA: Diagnosis not present

## 2016-08-11 DIAGNOSIS — I2699 Other pulmonary embolism without acute cor pulmonale: Secondary | ICD-10-CM

## 2016-08-11 DIAGNOSIS — I2510 Atherosclerotic heart disease of native coronary artery without angina pectoris: Secondary | ICD-10-CM | POA: Diagnosis not present

## 2016-08-11 LAB — POCT INR: INR: 3.1

## 2016-08-11 NOTE — Patient Instructions (Signed)

## 2016-08-24 ENCOUNTER — Other Ambulatory Visit: Payer: Self-pay | Admitting: Cardiovascular Disease

## 2016-08-24 MED FILL — WARFARIN SODIUM 5 MG TABLET: 5 | 90 days supply | Qty: 150 | Fill #0

## 2016-08-30 MED FILL — AMOXICILLIN 500 MG CAPSULE: 500 | 4 days supply | Qty: 16 | Fill #0

## 2016-08-31 MED FILL — FENOFIBRATE 134 MG CAPSULE: 134 | 90 days supply | Qty: 90 | Fill #3

## 2016-09-08 ENCOUNTER — Ambulatory Visit (INDEPENDENT_AMBULATORY_CARE_PROVIDER_SITE_OTHER): Payer: 59

## 2016-09-08 DIAGNOSIS — I2699 Other pulmonary embolism without acute cor pulmonale: Secondary | ICD-10-CM

## 2016-09-08 DIAGNOSIS — Z7901 Long term (current) use of anticoagulants: Secondary | ICD-10-CM

## 2016-09-08 DIAGNOSIS — I2510 Atherosclerotic heart disease of native coronary artery without angina pectoris: Secondary | ICD-10-CM

## 2016-09-08 DIAGNOSIS — Z9889 Other specified postprocedural states: Secondary | ICD-10-CM | POA: Diagnosis not present

## 2016-09-08 LAB — POCT INR: INR: 3.1

## 2016-09-27 ENCOUNTER — Other Ambulatory Visit: Payer: Self-pay | Admitting: Internal Medicine

## 2016-09-27 MED FILL — BENAZEPRIL HCL 20 MG TABLET: 20 | 90 days supply | Qty: 90 | Fill #0

## 2016-09-27 MED FILL — EZETIMIBE 10 MG TAB: 10 | 90 days supply | Qty: 90 | Fill #4

## 2016-09-29 ENCOUNTER — Ambulatory Visit (INDEPENDENT_AMBULATORY_CARE_PROVIDER_SITE_OTHER): Payer: 59 | Admitting: *Deleted

## 2016-09-29 DIAGNOSIS — I2699 Other pulmonary embolism without acute cor pulmonale: Secondary | ICD-10-CM | POA: Diagnosis not present

## 2016-09-29 DIAGNOSIS — Z7901 Long term (current) use of anticoagulants: Secondary | ICD-10-CM | POA: Diagnosis not present

## 2016-09-29 DIAGNOSIS — I2510 Atherosclerotic heart disease of native coronary artery without angina pectoris: Secondary | ICD-10-CM

## 2016-09-29 DIAGNOSIS — Z9889 Other specified postprocedural states: Secondary | ICD-10-CM | POA: Diagnosis not present

## 2016-09-29 LAB — POCT INR: INR: 2.5

## 2016-10-19 ENCOUNTER — Encounter: Payer: Self-pay | Admitting: Internal Medicine

## 2016-10-19 ENCOUNTER — Ambulatory Visit (INDEPENDENT_AMBULATORY_CARE_PROVIDER_SITE_OTHER): Payer: 59 | Admitting: Internal Medicine

## 2016-10-19 VITALS — BP 136/80 | HR 74 | Temp 97.2°F | Resp 16 | Ht 72.5 in | Wt 224.8 lb

## 2016-10-19 DIAGNOSIS — I10 Essential (primary) hypertension: Secondary | ICD-10-CM | POA: Diagnosis not present

## 2016-10-19 DIAGNOSIS — Z9889 Other specified postprocedural states: Secondary | ICD-10-CM

## 2016-10-19 DIAGNOSIS — E559 Vitamin D deficiency, unspecified: Secondary | ICD-10-CM | POA: Diagnosis not present

## 2016-10-19 DIAGNOSIS — Z79899 Other long term (current) drug therapy: Secondary | ICD-10-CM

## 2016-10-19 DIAGNOSIS — E782 Mixed hyperlipidemia: Secondary | ICD-10-CM | POA: Diagnosis not present

## 2016-10-19 DIAGNOSIS — N183 Chronic kidney disease, stage 3 unspecified: Secondary | ICD-10-CM

## 2016-10-19 DIAGNOSIS — E1122 Type 2 diabetes mellitus with diabetic chronic kidney disease: Secondary | ICD-10-CM

## 2016-10-19 DIAGNOSIS — Z0000 Encounter for general adult medical examination without abnormal findings: Secondary | ICD-10-CM | POA: Diagnosis not present

## 2016-10-19 DIAGNOSIS — Z1212 Encounter for screening for malignant neoplasm of rectum: Secondary | ICD-10-CM

## 2016-10-19 DIAGNOSIS — Z125 Encounter for screening for malignant neoplasm of prostate: Secondary | ICD-10-CM

## 2016-10-19 DIAGNOSIS — Z0001 Encounter for general adult medical examination with abnormal findings: Secondary | ICD-10-CM

## 2016-10-19 DIAGNOSIS — Z136 Encounter for screening for cardiovascular disorders: Secondary | ICD-10-CM

## 2016-10-19 LAB — CBC WITH DIFFERENTIAL/PLATELET
Basophils Absolute: 38 cells/uL (ref 0–200)
Basophils Relative: 1 %
EOS ABS: 76 {cells}/uL (ref 15–500)
Eosinophils Relative: 2 %
HEMATOCRIT: 47.2 % (ref 38.5–50.0)
HEMOGLOBIN: 15.9 g/dL (ref 13.2–17.1)
LYMPHS ABS: 1558 {cells}/uL (ref 850–3900)
Lymphocytes Relative: 41 %
MCH: 30.8 pg (ref 27.0–33.0)
MCHC: 33.7 g/dL (ref 32.0–36.0)
MCV: 91.3 fL (ref 80.0–100.0)
MONO ABS: 304 {cells}/uL (ref 200–950)
MPV: 11.5 fL (ref 7.5–12.5)
Monocytes Relative: 8 %
NEUTROS ABS: 1824 {cells}/uL (ref 1500–7800)
Neutrophils Relative %: 48 %
Platelets: 234 10*3/uL (ref 140–400)
RBC: 5.17 MIL/uL (ref 4.20–5.80)
RDW: 14 % (ref 11.0–15.0)
WBC: 3.8 10*3/uL (ref 3.8–10.8)

## 2016-10-19 LAB — TSH: TSH: 1.12 m[IU]/L (ref 0.40–4.50)

## 2016-10-19 NOTE — Progress Notes (Signed)
[facility]   [name], M.D.      Dyanne Carrel. Steffanie Dunn, P.A.-C [facility]                [address]                [place], [place]. [address] Telephone [phone] Telefax [phone] Annual  Screening/Preventative Visit  & Comprehensive Evaluation & Examination     This very nice 71 y.o. MBM presents for a Screening/Preventative Visit & comprehensive evaluation and management of multiple medical co-morbidities.  Patient has been followed for HTN, T2_NIDDM, Hyperlipidemia and Vitamin D Deficiency.     HTN predates since 2005. Patient's BP has been controlled at home.  Today's BP is at goal - 136/80. In 2009 , he had a heart cath negative for CAD.  In 2014, he underwent Mitral Valvuloplasty by Dr Barry Dienes . He has hx/o PE in 2010 and 2012 and has been on Coumadin since. Patient denies any cardiac symptoms as chest pain, palpitations, shortness of breath, dizziness or ankle swelling.     Patient's hyperlipidemia is controlled with diet and medications. Patient denies myalgias or other medication SE's. Last lipids were not at goal: Lab Results  Component Value Date   CHOL 197 06/07/2016   HDL 58 06/07/2016   LDLCALC 119 (H) 06/07/2016   TRIG 99 06/07/2016   CHOLHDL 3.4 06/07/2016      Patient has T2_NIDDM (2002) w/CKD3 (GFR 46) and patient denies reactive hypoglycemic symptoms, visual blurring, diabetic polys or paresthesias. Last A1c was not at goal: Lab Results  Component Value Date   HGBA1C 6.4 (H) 06/07/2016       Finally, patient has history of Vitamin D Deficiency ("30" in 2013)  and last vitamin D was not at goal: Lab Results  Component Value Date   VD25OH 47 06/07/2016   Medication Sig  . acetaminophen 500 MG tablet Take every 6 hrs as needed for pain.  . benazepril  20 MG tablet TAKE 1 TAB ONCE DAILY - takes 1/2 tab  . VITAMIN D Take by mouth daily. TAKING 5,000 UNITS DAILY  . CINNAMON  Takes 2  tablets (2000mg  ) daily  . ezetimibe  10 MG tablet Take 10 mg by mouth daily.  . fenofibrate 134 MG capsule TAKE 1 CAPSULE BY MOUTH EVERY EVENING.  Marland Kitchen loratadine 10 MG tablet Take 10 mg by mouth daily as needed for allergies.  Marland Kitchen NASONEX nasal spray Place 2 sprays into the nose daily as needed  . Psyllium   Take 1 tablet by mouth daily.  Marland Kitchen warfarin (5 MG tablet TAKE AS DIRECTED BY COUMADIN CLINIC.   Allergies  Allergen Reactions  . Statins     REACTION: muscle aches  . Welchol [Colesevelam Hcl]     unknown   Past Medical History:  Diagnosis Date  . Allergy    SEASONAL  . Cataract    SMALL IN LEFT EYE  . Coronary artery disease    non-obstructive by cath 2009  . Degenerative joint disease   . Heart murmur   . Hyperlipidemia   . Hypertension    Clearance with note Dr Marvel Plan on chart,  Lovonox bridging  ordered by Dr Sanjuana Kava    EKG 9/12, chst 9/12 EPIC  . Inguinal hernia    right  . Osteoarthritis   . Peripheral vascular disease (HCC)   . Pre-diabetes   . Pulmonary embolism [facility])    march 2010,  2012              /   DVT x  2  LEFT 2010  . S/P mitral valve repair 08/15/2012   Complex valvuloplasty including triangular resection of posterior leaflet, artificial Gore-tex neocord placement x4 and Sorin Memo 3D ring annuloplasty via right mini thoracotomy approach  . Seasonal allergies   . Severe mitral regurgitation by prior echocardiogram   . Sleep apnea    STOP BANG SCORE 4  . Vitamin D deficiency   . Wears glasses    Health Maintenance  Topic Date Due  . Hepatitis C Screening  1946-03-04  . TETANUS/TDAP  11/21/2013  . OPHTHALMOLOGY EXAM  02/27/2016  . FOOT EXAM  09/24/2016  . INFLUENZA VACCINE  10/20/2016  . HEMOGLOBIN A1C  12/08/2016  . COLONOSCOPY  07/11/2024  . PNA vac Low Risk Adult  Completed   Immunization History  Administered Date(s) Administered  . Influenza, High Dose Seasonal PF 12/20/2013, 01/14/2015, 01/06/2016  . Influenza-Unspecified 01/20/2013  .  Pneumococcal Conjugate-13 02/23/2016  . Pneumococcal-Unspecified 05/11/2012  . Td 11/22/2003   Past Surgical History:  Procedure Laterality Date  . achillis tendon     repair 20 years ago  . CARDIAC CATHETERIZATION    . COLONOSCOPY    . COLONOSCOPY W/ BIOPSIES AND POLYPECTOMY    . HIP ARTHROPLASTY     11/06/07  . INGUINAL HERNIA REPAIR Right 11/07/2015   Procedure: LAPAROSCOPIC RIGHT INGUINAL HERNIA WITH MESH;  Surgeon: Axel Filler, MD;  Location: [facility] OR;  Service: General;  Laterality: Right;  . INSERTION OF MESH Right 11/07/2015   Procedure: INSERTION OF MESH;  Surgeon: Axel Filler, MD;  Location: [facility] OR;  Service: General;  Laterality: Right;  . INTRAOPERATIVE TRANSESOPHAGEAL ECHOCARDIOGRAM N/A 08/15/2012   Procedure: INTRAOPERATIVE TRANSESOPHAGEAL ECHOCARDIOGRAM;  Surgeon: Purcell Nails, MD;  Location: [facility] OR;  Service: Open Heart Surgery;  Laterality: N/A;  . MITRAL VALVE REPAIR Right 08/15/2012   Procedure: MINIMALLY INVASIVE MITRAL VALVE REPAIR (MVR);  Surgeon: Purcell Nails, MD;  Location: [facility] OR;  Service: Open Heart Surgery;  Laterality: Right;  . TEE WITHOUT CARDIOVERSION N/A 07/04/2012   Procedure: TRANSESOPHAGEAL ECHOCARDIOGRAM (TEE);  Surgeon: Laurey Morale, MD;  Location: [facility] ENDOSCOPY;  Service: Cardiovascular;  Laterality: N/A;  . TOTAL HIP ARTHROPLASTY  08/24/2011   Procedure: TOTAL HIP ARTHROPLASTY ANTERIOR APPROACH;  Surgeon: Shelda Pal, MD;  Location: [facility] ORS;  Service: Orthopedics;  Laterality: Right;   Family History  Problem Relation Age of Onset  . Heart attack Father   . Hypertension Father   . Coronary artery disease Unknown   . Heart attack Brother 60  . Hypertension Sister   . Hypertension Brother   . Hyperlipidemia Sister   . Diabetes Sister   . Diabetes Brother    Social History   Social History  . Marital status: Married    Spouse name: N/A  . Number of children: 3  . Years of education: N/A   Occupational History  . Industrial  hygeinist [employer]   Social History Main Topics  . Smoking status: Former Smoker    Types: Cigars    Quit date: 03/22/1973  . Smokeless tobacco: Current User    Types: Chew     Comment: uses smokeless tobacco  . Alcohol use No  . Drug use: No  . Sexual activity: Not Currently    ROS Constitutional: Denies fever, chills, weight loss/gain, headaches, insomnia,  night sweats or change in appetite.  Does c/o fatigue. Eyes: Denies redness, blurred vision, diplopia, discharge, itchy or watery eyes.  ENT: Denies discharge, congestion, post nasal drip, epistaxis, sore throat, earache, hearing loss, dental pain, Tinnitus, Vertigo, Sinus pain or snoring.  Cardio: Denies chest pain, palpitations, irregular heartbeat, syncope, dyspnea, diaphoresis, orthopnea, PND, claudication or edema Respiratory: denies cough, dyspnea, DOE, pleurisy, hoarseness, laryngitis or wheezing.  Gastrointestinal: Denies dysphagia, heartburn, reflux, water brash, pain, cramps, nausea, vomiting, bloating, diarrhea, constipation, hematemesis, melena, hematochezia, jaundice or hemorrhoids Genitourinary: Denies dysuria, frequency, urgency, nocturia, hesitancy, discharge, hematuria or flank pain Musculoskeletal: Denies arthralgia, myalgia, stiffness, Jt. Swelling, pain, limp or strain/sprain. Denies Falls. Skin: Denies puritis, rash, hives, warts, acne, eczema or change in skin lesion Neuro: No weakness, tremor, incoordination, spasms, paresthesia or pain Psychiatric: Denies confusion, memory loss or sensory loss. Denies Depression. Endocrine: Denies change in weight, skin, hair change, nocturia, and paresthesia, diabetic polys, visual blurring or hyper / hypo glycemic episodes.  Heme/Lymph: No excessive bleeding, bruising or enlarged lymph nodes.  Physical Exam  BP 136/80   Pulse 74   Temp (!) 97.2 F (36.2 C)   Resp 16   Ht 6' 0.5" (1.842 m)   Wt 224 lb 12.8 oz (102 kg)   BMI 30.07 kg/m   General  Appearance: Well nourished and well groomed and in no apparent distress.  Eyes: PERRLA, EOMs, conjunctiva no swelling or erythema, normal fundi and vessels. Sinuses: No frontal/maxillary tenderness ENT/Mouth: EACs patent / TMs  nl. Nares clear without erythema, swelling, mucoid exudates. Oral hygiene is good. No erythema, swelling, or exudate. Tongue normal, non-obstructing. Tonsils not swollen or erythematous. Hearing normal.  Neck: Supple, thyroid normal. No bruits, nodes or JVD. Respiratory: Respiratory effort normal.  BS equal and clear bilateral without rales, rhonci, wheezing or stridor. Cardio: Heart sounds are normal with regular rate and rhythm and no murmurs, rubs or gallops. Peripheral pulses are normal and equal bilaterally without edema. No aortic or femoral bruits. Chest: symmetric with normal excursions and percussion.  Abdomen: Soft, with Nl bowel sounds. Nontender, no guarding, rebound, hernias, masses, or organomegaly.  Lymphatics: Non tender without lymphadenopathy.  Genitourinary: No hernias.Testes nl. DRE - prostate nl for age - smooth & firm w/o nodules. Musculoskeletal: Full ROM all peripheral extremities, joint stability, 5/5 strength, and normal gait. Skin: Warm and dry without rashes, lesions, cyanosis, clubbing or  ecchymosis.  Neuro: Cranial nerves intact, reflexes equal bilaterally. Normal muscle tone, no cerebellar symptoms. Sensation intact to touch, vibratory and Monofilament to the toes bilaterally. Sensation intact to touch, vibratory and Monofilament to the toes bilaterally. Pysch: Alert and oriented X 3 with normal affect, insight and judgment appropriate.   Assessment and Plan  1. Annual Preventative/Screening Exam   2. Essential hypertension  - EKG 12-Lead - [place], RETROPERITNL ABD,  LTD - Urinalysis, Routine w reflex microscopic - Microalbumin / creatinine urine ratio - CBC with Differential/Platelet - BASIC METABOLIC PANEL WITH GFR - Magnesium -  TSH  3. Hyperlipidemia, mixed  - EKG 12-Lead - [place], RETROPERITNL ABD,  LTD - Hepatic function panel - Lipid panel - TSH  4. T2_NIDDM w/CKD3   (HCC)  - EKG 12-Lead - [place], RETROPERITNL ABD,  LTD - HM DIABETES FOOT EXAM - LOW EXTREMITY NEUR EXAM DOCUM - Hemoglobin A1c - Insulin, random  5. Vitamin D deficiency  - VITAMIN D 25 Hydroxy   6. S/P mitral valve repair  - EKG 12-Lead  7. Screening for rectal cancer  - POC Hemoccult Bld/Stl   8. Prostate  cancer screening  - PSA  9. Screening for ischemic heart disease  - EKG 12-Lead - Lipid panel  10. Screening for AAA (aortic abdominal aneurysm)  - [place], RETROPERITNL ABD,  LTD  11. Medication management  - Microalbumin / creatinine urine ratio - CBC with Differential/Platelet - BASIC METABOLIC PANEL WITH GFR - Hepatic function panel - Magnesium - Lipid panel - TSH - Hemoglobin A1c - Insulin, random - VITAMIN D 25 Hydroxy       Patient was counseled in prudent diet, weight control to achieve/maintain BMI less than 25, BP monitoring, regular exercise and medications as discussed.  Discussed med effects and SE's. Routine screening labs and tests as requested with regular follow-up as recommended. Over 40 minutes of exam, counseling, chart review and high complex critical decision making was performed

## 2016-10-19 NOTE — Patient Instructions (Signed)

## 2016-10-20 LAB — MICROALBUMIN / CREATININE URINE RATIO
Creatinine, Urine: 136 mg/dL (ref 20–370)
Microalb Creat Ratio: 54 mcg/mg creat — ABNORMAL HIGH (ref <30)
Microalb, Ur: 7.4 mg/dL

## 2016-10-20 LAB — MAGNESIUM: MAGNESIUM: 1.8 mg/dL (ref 1.5–2.5)

## 2016-10-20 LAB — URINALYSIS, ROUTINE W REFLEX MICROSCOPIC
BILIRUBIN URINE: NEGATIVE
Glucose, UA: NEGATIVE
HGB URINE DIPSTICK: NEGATIVE
KETONES UR: NEGATIVE
Leukocytes, UA: NEGATIVE
NITRITE: NEGATIVE
PH: 5 (ref 5.0–8.0)
Protein, ur: NEGATIVE
SPECIFIC GRAVITY, URINE: 1.019 (ref 1.001–1.035)

## 2016-10-20 LAB — BASIC METABOLIC PANEL WITH GFR
BUN: 20 mg/dL (ref 7–25)
CO2: 22 mmol/L (ref 20–31)
CREATININE: 1.44 mg/dL — AB (ref 0.70–1.18)
Calcium: 9.5 mg/dL (ref 8.6–10.3)
Chloride: 104 mmol/L (ref 98–110)
GFR, EST AFRICAN AMERICAN: 56 mL/min — AB (ref >=60)
GFR, Est Non African American: 49 mL/min — ABNORMAL LOW (ref >=60)
GLUCOSE: 112 mg/dL — AB (ref 65–99)
POTASSIUM: 4.4 mmol/L (ref 3.5–5.3)
Sodium: 138 mmol/L (ref 135–146)

## 2016-10-20 LAB — HEPATIC FUNCTION PANEL
ALBUMIN: 4.6 g/dL (ref 3.6–5.1)
ALK PHOS: 44 U/L (ref 40–115)
ALT: 20 U/L (ref 9–46)
AST: 27 U/L (ref 10–35)
BILIRUBIN TOTAL: 0.7 mg/dL (ref 0.2–1.2)
Bilirubin, Direct: 0.1 mg/dL (ref <=0.2)
Indirect Bilirubin: 0.6 mg/dL (ref 0.2–1.2)
TOTAL PROTEIN: 7.1 g/dL (ref 6.1–8.1)

## 2016-10-20 LAB — LIPID PANEL
Cholesterol: 170 mg/dL (ref <200)
HDL: 49 mg/dL (ref >40)
LDL CALC: 107 mg/dL — AB (ref <100)
TRIGLYCERIDES: 70 mg/dL (ref <150)
Total CHOL/HDL Ratio: 3.5 Ratio (ref <5.0)
VLDL: 14 mg/dL (ref <30)

## 2016-10-20 LAB — HEMOGLOBIN A1C
Hgb A1c MFr Bld: 6.6 % — ABNORMAL HIGH (ref <5.7)
MEAN PLASMA GLUCOSE: 143 mg/dL

## 2016-10-20 LAB — VITAMIN D 25 HYDROXY (VIT D DEFICIENCY, FRACTURES): VIT D 25 HYDROXY: 60 ng/mL (ref 30–100)

## 2016-10-20 LAB — PSA: PSA: 0.6 ng/mL (ref <=4.0)

## 2016-10-21 LAB — INSULIN, RANDOM: INSULIN: 10.3 u[IU]/mL (ref 2.0–19.6)

## 2016-10-23 ENCOUNTER — Encounter (HOSPITAL_COMMUNITY): Payer: Self-pay | Admitting: *Deleted

## 2016-10-23 ENCOUNTER — Ambulatory Visit (HOSPITAL_COMMUNITY)
Admission: EM | Admit: 2016-10-23 | Discharge: 2016-10-23 | Disposition: A | Discharge status: Home / Self Care | Payer: 59 | Attending: Radiology | Admitting: Radiology

## 2016-10-23 DIAGNOSIS — M25511 Pain in right shoulder: Secondary | ICD-10-CM | POA: Diagnosis not present

## 2016-10-23 DIAGNOSIS — M545 Low back pain: Secondary | ICD-10-CM | POA: Diagnosis not present

## 2016-10-23 MED ORDER — PREDNISONE 10 MG (21) PO TBPK
ORAL_TABLET | Freq: Every day | ORAL | 0 refills | Status: DC
Start: 2016-10-23 — End: 2016-10-23

## 2016-10-23 MED ORDER — PREDNISONE 10 MG (21) PO TBPK: ORAL_TABLET | Freq: Every day | ORAL | 0 refills | Status: DC

## 2016-10-23 MED ORDER — CYCLOBENZAPRINE HCL 10 MG PO TABS: 10.0000 mg | ORAL_TABLET | Freq: Two times a day (BID) | ORAL | 0 refills | Status: DC | PRN

## 2016-10-23 MED ORDER — PREDNISONE 10 MG (21) PO TBPK
ORAL_TABLET | Freq: Every day | ORAL | 0 refills | Status: DC
Start: 2016-10-23 — End: 2016-11-09
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
## 2016-10-23 NOTE — Discharge Instructions (Signed)
Apply ice and heat as tolerated to affected area. Muscle relaxant may make you tired be cautious.
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
## 2016-11-03 ENCOUNTER — Ambulatory Visit (INDEPENDENT_AMBULATORY_CARE_PROVIDER_SITE_OTHER): Payer: 59 | Admitting: *Deleted

## 2016-11-03 DIAGNOSIS — I2699 Other pulmonary embolism without acute cor pulmonale: Secondary | ICD-10-CM

## 2016-11-03 DIAGNOSIS — Z7901 Long term (current) use of anticoagulants: Secondary | ICD-10-CM | POA: Diagnosis not present

## 2016-11-03 DIAGNOSIS — Z9889 Other specified postprocedural states: Secondary | ICD-10-CM

## 2016-11-03 DIAGNOSIS — I2510 Atherosclerotic heart disease of native coronary artery without angina pectoris: Secondary | ICD-10-CM

## 2016-11-03 LAB — POCT INR: INR: 4.4

## 2016-11-09 ENCOUNTER — Ambulatory Visit (INDEPENDENT_AMBULATORY_CARE_PROVIDER_SITE_OTHER): Payer: 59 | Admitting: Internal Medicine

## 2016-11-09 VITALS — BP 128/84 | HR 56 | Temp 97.4°F | Resp 16 | Ht 72.5 in | Wt 223.2 lb

## 2016-11-09 DIAGNOSIS — N410 Acute prostatitis: Secondary | ICD-10-CM | POA: Diagnosis not present

## 2016-11-09 DIAGNOSIS — I2510 Atherosclerotic heart disease of native coronary artery without angina pectoris: Secondary | ICD-10-CM

## 2016-11-09 DIAGNOSIS — N401 Enlarged prostate with lower urinary tract symptoms: Secondary | ICD-10-CM | POA: Diagnosis not present

## 2016-11-09 MED ORDER — CIPROFLOXACIN HCL 500 MG PO TABS: ORAL_TABLET | ORAL | 0 refills | Status: AC

## 2016-11-09 MED ORDER — TAMSULOSIN HCL 0.4 MG PO CAPS: ORAL_CAPSULE | ORAL | 2 refills | Status: DC

## 2016-11-09 MED FILL — CIPROFLOXACIN HCL 500 MG TA: 500 | 30 days supply | Qty: 60 | Fill #0

## 2016-11-09 MED FILL — TAMSULOSIN HCL 0.4 MG CAP: 0.4 | 30 days supply | Qty: 30 | Fill #0

## 2016-11-09 NOTE — Patient Instructions (Addendum)
Cut coumadin dose in 1/2 while on Antibiotic  and have Protime / INR rechecked in 1 week   +++++++++++++++++++++++++++++ Prostatitis Prostatitis is swelling of the prostate gland. The prostate helps to make semen. It is below a man's bladder, in front of the rectum. There are different types of prostatitis. Follow these instructions at home:  Take over-the-counter and prescription medicines only as told by your doctor.  If you were prescribed an antibiotic medicine, take it as told by your doctor. Do not stop taking the antibiotic even if you start to feel better.  If your doctor prescribed exercises, do them as directed.  Take sitz baths as told by your doctor. To take a sitz bath, sit in warm water that is deep enough to cover your hips and butt.  Keep all follow-up visits as told by your doctor. This is important. Contact a doctor if:  Your symptoms get worse.  You have a fever. Get help right away if:  You have chills.  You feel sick to your stomach (nauseous).  You throw up (vomit).  You feel light-headed.  You feel like you might pass out (faint).  You cannot pee (urinate).  You have blood or clumps of blood (blood clots) in your pee (urine). ++++++++++++++++++++++++++++++++++++   Benign Prostatic Hyperplasia Benign prostatic hyperplasia is when the prostate gland is bigger than normal (enlarged). The prostate is a gland that produces the fluid that goes into semen. It is near the opening to the bladder and it surrounds the tube that drains urine out of the body (urethra). Benign prostatic hyperplasia is common among older men and it typically causes problems with urinating. The prostate grows slowly as you age. As the prostate grows, it can pinch the urethra. This causes the bladder to work too hard to pass urine, which leads to a thickened bladder wall. The bladder may eventually become weak and unable to empty completely. What are the causes? The exact cause of  this condition is not known. It may be related to changes in hormones as the body ages. What increases the risk? You are more likely to develop this condition if:  You have a family history of the condition.  You are age 8 or older.  You have a history of erectile dysfunction.  You do not exercise.  You have certain medical conditions, including: ? Type 2 diabetes. ? Obesity. ? Heart and circulatory disease.  What are the signs or symptoms? Symptoms of this condition include:  Weak or interrupted urine stream.  Dribbling or leaking urine.  Feeling like the bladder has not emptied completely.  Difficulty starting urination.  Getting up frequently at night to urinate.  Urinating more often (8 or more times a day).  Accidental loss of urine (urinary incontinence).  Pain during urination or ejaculation.  Urine with an unusual smell or color.  The size of the prostate does not always determine the severity of the symptoms. For example, a man with a large prostate may experience minor symptoms, or a man with a smaller prostate may experience a severe blockage. How is this diagnosed? This condition may be diagnosed based on:  Your medical history and symptoms.  A physical exam. This usually includes a digital rectal exam. During this exam, your health care provider places a gloved, lubricated finger into the rectum to feel the size of the prostate.  A blood test. This test checks for high levels of a protein that is produced by the prostate (prostate specific  antigen, PSA).  Tests to examine how well the urethra and bladder are functioning (urodynamic tests).  Cystoscopy. For this test, a small, tube-shaped instrument (cystoscope) is used to look inside the urethra and bladder. The cystoscope is placed into the urinary tract through the opening at the tip of the penis.  Urine tests.  Ultrasound.  How is this treated? Treatment for this condition depends on how  severe your symptoms are. Treatment may include:  Active surveillance or "watchful waiting." If your symptoms are mild, your health care provider may delay treatment and ask you to keep track of your symptoms. You will have regular checkups to examine the size of your prostate, discuss symptoms, and determine whether treatment is needed.  Medicines. These may be used to: ? Stop prostate growth. ? Shrink the prostate. ? Relieve symptoms.  Lifestyle changes, including: ? Pelvic floor muscle exercises. The pelvic floor muscles are a group of muscles that relax when you urinate. ? Bladder training. This involves exercises that train the bladder to hold more urine for longer periods. ? Reducing the amount of liquid that you drink. This is especially important before sleeping and before long periods of time spent in public. ? Reducing the amount of caffeine and alcohol that you drink. ? Treating or preventing constipation.  Surgery to reduce the size of the prostate or widen the urethra. This is typically done if your symptoms are severe or there are serious complications from the enlarged prostate.  Follow these instructions at home: Medicines  Take over-the-counter and prescription medicines as told by your health care provider.  Avoid certain medicines, such as decongestants, antihistamines, and some prescription medicines as told by your health care provider. Ask your health care provider which medicines you should avoid. General instructions  Monitor your symptoms for any changes. Tell your health care provider about any changes.  Give yourself time when you urinate.  Avoid certain beverages that can irritate the bladder, such as: ? Alcohol. ? Caffeinated drinks like coffee, tea, and cola.  Avoid drinking large amounts of liquid before bed or before going out in public.  Do pelvic floor muscle or bladder training exercises as told by your health care provider.  Keep all follow-up  visits as told by your health care provider. This is important. Contact a health care provider if:  Your develop new or worse symptoms.  You have trouble getting or maintaining an erection.  You have a fever.  You have pain or burning during urination.  You have blood in your urine. Get help right away if:  You have severe pain when urinating.  You cannot urinate.  You have severe pain in your abdomen.  You are dizzy.  You faint.  You have severe back pain.  Your urine is dark red and difficult to see through.  You have large blood clots in your urine.  You have severe pain after an erection.  You have chest pain, dizziness, or nausea during sexual activity. Summary  The prostate is a gland that produces the fluid that goes into semen. It is near the opening to the bladder and it surrounds the tube that drains urine out of the body (urethra).  Benign prostatic hyperplasia is common among older men and it typically causes problems with urinating.  If your symptoms are mild, your health care provider may delay treatment and ask you to keep track of your symptoms. You will have regular checkups to examine the size of your prostate, discuss symptoms,  and determine whether treatment is needed.  If directed, you may need to avoid certain medicines, such as decongestants, antihistamines, and some prescription medicines.  Contact your health care provider if you develop new or worse symptoms. This information is not intended to replace advice given to you by your health care provider. Make sure you discuss any questions you have with your health care provider. Document Released: 03/08/2005 Document Revised: 01/26/2016 Document Reviewed: 01/26/2016 Elsevier Interactive Patient Education  2017 [employer].

## 2016-11-09 NOTE — Progress Notes (Signed)
[place] [facility]  Lucky Cowboy, M.D.      Dyanne Carrel. Steffanie Dunn, P.A.-C [facility] [address]  [place], [place]. [address] Telephone [phone] Telefax [phone] Subjective:    Patient ID: [name], male    DOB: 02-26-1946, 71 y.o.   MRN: [id number]  HPI  Patient presented emergently this am as a walk-in c/o 12 hr onset of urinary frequency every 1-2 hours and urinary burning and unable to sleep at all last night.  C/o urgency and sensation of incomplete bladder emptying. C/o chills, ? Fever, denies rash or respiratory, GI sx's.   Medication Sig  . acetaminophen (TYLENOL) 500 MG tablet Take 500 mg by mouth every 6 (six) hours as needed for pain.  . benazepril (LOTENSIN) 20 MG tablet TAKE 1 TABLET BY MOUTH ONCE DAILY  . Cholecalciferol (VITAMIN D PO) Take by mouth daily. TAKING 5,000 UNITS DAILY  . CINNAMON PO Takes 2 tablets (2000mg  ) daily  . cyclobenzaprine (FLEXERIL) 10 MG tablet Take 1 tablet (10 mg total) by mouth 2 (two) times daily as needed for muscle spasms.  Marland Kitchen ezetimibe (ZETIA) 10 MG tablet Take 10 mg by mouth daily.  . fenofibrate micronized (LOFIBRA) 134 MG capsule TAKE 1 CAPSULE BY MOUTH EVERY EVENING.  Marland Kitchen loratadine (CLARITIN) 10 MG tablet Take 10 mg by mouth daily as needed for allergies.  . mometasone (NASONEX) 50 MCG/ACT nasal spray Place 2 sprays into the nose daily as needed (allergies).   . Psyllium ([employer] NATURAL DAILY FIBER PO) Take 1 tablet by mouth daily.  Marland Kitchen warfarin (COUMADIN) 5 MG tablet TAKE AS DIRECTED BY COUMADIN CLINIC.  Marland Kitchen predniSONE (STERAPRED UNI-PAK 21 TAB) 10 MG (21) TBPK tablet Take by mouth daily. Take 6 tabs by mouth daily  for 2 days, then 5 tabs for 2 days, then 4 tabs for 2 days, then 3 tabs for 2 days, 2 tabs for 2 days, then 1 tab by mouth daily for 2 days   No facility-administered medications prior to visit.    Allergies  Allergen Reactions  . Statins     REACTION: muscle  aches  . Welchol [Colesevelam Hcl]     unknown   Past Medical History:  Diagnosis Date  . Allergy    SEASONAL  . Cataract    SMALL IN LEFT EYE  . Coronary artery disease    non-obstructive by cath 2009  . Degenerative joint disease   . Heart murmur   . Hyperlipidemia   . Hypertension    Clearance with note Dr Marvel Plan on chart,  Lovonox bridging  ordered by Dr Sanjuana Kava    EKG 9/12, chst 9/12 EPIC  . Inguinal hernia    right  . Osteoarthritis   . Peripheral vascular disease (HCC)   . Pre-diabetes   . Pulmonary embolism [facility])    march 2010, 2012              /   DVT x  2  LEFT 2010  . S/P mitral valve repair 08/15/2012   Complex valvuloplasty including triangular resection of posterior leaflet, artificial Gore-tex neocord placement x4 and Sorin Memo 3D ring annuloplasty via right mini thoracotomy approach  . Seasonal allergies   . Severe mitral regurgitation by prior echocardiogram   . Sleep apnea    STOP BANG SCORE 4  . Vitamin D deficiency   . Wears glasses    Past Surgical History:  Procedure Laterality Date  . achillis tendon  repair 20 years ago  . CARDIAC CATHETERIZATION    . COLONOSCOPY    . COLONOSCOPY W/ BIOPSIES AND POLYPECTOMY    . HIP ARTHROPLASTY     11/06/07  . INGUINAL HERNIA REPAIR Right 11/07/2015   Procedure: LAPAROSCOPIC RIGHT INGUINAL HERNIA WITH MESH;  Surgeon: Axel Filler, MD;  Location: [facility] OR;  Service: General;  Laterality: Right;  . INSERTION OF MESH Right 11/07/2015   Procedure: INSERTION OF MESH;  Surgeon: Axel Filler, MD;  Location: [facility] OR;  Service: General;  Laterality: Right;  . INTRAOPERATIVE TRANSESOPHAGEAL ECHOCARDIOGRAM N/A 08/15/2012   Procedure: INTRAOPERATIVE TRANSESOPHAGEAL ECHOCARDIOGRAM;  Surgeon: Purcell Nails, MD;  Location: [facility] OR;  Service: Open Heart Surgery;  Laterality: N/A;  . MITRAL VALVE REPAIR Right 08/15/2012   Procedure: MINIMALLY INVASIVE MITRAL VALVE REPAIR (MVR);  Surgeon: Purcell Nails, MD;  Location: [facility]  OR;  Service: Open Heart Surgery;  Laterality: Right;  . TEE WITHOUT CARDIOVERSION N/A 07/04/2012   Procedure: TRANSESOPHAGEAL ECHOCARDIOGRAM (TEE);  Surgeon: Laurey Morale, MD;  Location: [facility] ENDOSCOPY;  Service: Cardiovascular;  Laterality: N/A;  . TOTAL HIP ARTHROPLASTY  08/24/2011   Procedure: TOTAL HIP ARTHROPLASTY ANTERIOR APPROACH;  Surgeon: Shelda Pal, MD;  Location: [facility] ORS;  Service: Orthopedics;  Laterality: Right;   Review of Systems  10 point systems review negative except as above.    Objective:   Physical Exam  BP 128/84   Pulse (!) 56   Temp (!) 97.4 F (36.3 C)   Resp 16   Ht 6' 0.5" (1.842 m)   Wt 223 lb 3.2 oz (101.2 kg)   BMI 29.86 kg/m   HEENT - WNL. Neck - supple.  Chest - Clear equal BS. Cor - Nl HS. RRR w/o sig MGR. PP 1(+). No edema. Abd - Neg/Benign GU - DRE (+) tender boggy prostate / neg Hemoccult MS- FROM w/o deformities.  Gait Nl. Neuro -  Nl w/o focal abnormalities.    Assessment & Plan:   1. Acute prostatitis  - Urinalysis, Routine w reflex microscopic - Urine Culture - PSA  - Rx pending culture for ciprofloxacin (CIPRO) 500 MG tablet; Take 1 tablet 2 x / day with food for prostate infection  Dispense: 60 tablet; Refill: 0  2. Benign localized prostatic hyperplasia with lower urinary tract symptoms (LUTS)  - PSA - tamsulosin (FLOMAX) 0.4 MG CAPS capsule; Take 1 capsule at bedtime for prostate  Dispense: 30 capsule; Refill: 2

## 2016-11-10 LAB — URINALYSIS, ROUTINE W REFLEX MICROSCOPIC
BACTERIA UA: NONE SEEN /HPF
BILIRUBIN URINE: NEGATIVE
HYALINE CAST: NONE SEEN /LPF
Hgb urine dipstick: NEGATIVE
Ketones, ur: NEGATIVE
NITRITE: NEGATIVE
SPECIFIC GRAVITY, URINE: 1.021 (ref 1.001–1.03)
SQUAMOUS EPITHELIAL / LPF: NONE SEEN /HPF (ref <=5)
pH: <=5 (ref 5.0–8.0)

## 2016-11-10 LAB — PSA: PSA: 3 ng/mL (ref <=4.0)

## 2016-11-11 ENCOUNTER — Other Ambulatory Visit: Payer: Self-pay | Admitting: Internal Medicine

## 2016-11-11 DIAGNOSIS — N410 Acute prostatitis: Secondary | ICD-10-CM

## 2016-11-11 LAB — URINE CULTURE
MICRO NUMBER:: 80908344
SPECIMEN QUALITY: ADEQUATE

## 2016-11-12 ENCOUNTER — Ambulatory Visit (INDEPENDENT_AMBULATORY_CARE_PROVIDER_SITE_OTHER): Payer: 59 | Admitting: *Deleted

## 2016-11-12 DIAGNOSIS — I2699 Other pulmonary embolism without acute cor pulmonale: Secondary | ICD-10-CM | POA: Diagnosis not present

## 2016-11-12 DIAGNOSIS — I2510 Atherosclerotic heart disease of native coronary artery without angina pectoris: Secondary | ICD-10-CM

## 2016-11-12 DIAGNOSIS — Z9889 Other specified postprocedural states: Secondary | ICD-10-CM

## 2016-11-12 DIAGNOSIS — Z7901 Long term (current) use of anticoagulants: Secondary | ICD-10-CM | POA: Diagnosis not present

## 2016-11-12 LAB — POCT INR: INR: 2.9

## 2016-11-17 ENCOUNTER — Ambulatory Visit (INDEPENDENT_AMBULATORY_CARE_PROVIDER_SITE_OTHER): Payer: 59 | Admitting: *Deleted

## 2016-11-17 DIAGNOSIS — I2510 Atherosclerotic heart disease of native coronary artery without angina pectoris: Secondary | ICD-10-CM

## 2016-11-17 DIAGNOSIS — Z7901 Long term (current) use of anticoagulants: Secondary | ICD-10-CM

## 2016-11-17 DIAGNOSIS — Z9889 Other specified postprocedural states: Secondary | ICD-10-CM

## 2016-11-17 DIAGNOSIS — I2699 Other pulmonary embolism without acute cor pulmonale: Secondary | ICD-10-CM

## 2016-11-17 LAB — POCT INR: INR: 2.6

## 2016-12-01 ENCOUNTER — Ambulatory Visit (INDEPENDENT_AMBULATORY_CARE_PROVIDER_SITE_OTHER): Payer: 59 | Admitting: Pharmacist

## 2016-12-01 DIAGNOSIS — Z7901 Long term (current) use of anticoagulants: Secondary | ICD-10-CM

## 2016-12-01 DIAGNOSIS — Z9889 Other specified postprocedural states: Secondary | ICD-10-CM

## 2016-12-01 DIAGNOSIS — I2699 Other pulmonary embolism without acute cor pulmonale: Secondary | ICD-10-CM

## 2016-12-01 LAB — POCT INR: INR: 2.7

## 2016-12-06 ENCOUNTER — Other Ambulatory Visit: Payer: Self-pay | Admitting: Internal Medicine

## 2016-12-06 MED FILL — FENOFIBRATE 134 MG CAPSULE: 134 | 90 days supply | Qty: 90 | Fill #0

## 2016-12-06 MED FILL — WARFARIN SODIUM 5 MG TABLET: 5 | 90 days supply | Qty: 150 | Fill #1

## 2016-12-09 MED FILL — TAMSULOSIN HCL 0.4 MG CAP: 0.4 | 30 days supply | Qty: 30 | Fill #1

## 2016-12-28 ENCOUNTER — Ambulatory Visit (INDEPENDENT_AMBULATORY_CARE_PROVIDER_SITE_OTHER): Payer: 59

## 2016-12-28 ENCOUNTER — Other Ambulatory Visit: Payer: Self-pay

## 2016-12-28 DIAGNOSIS — N390 Urinary tract infection, site not specified: Secondary | ICD-10-CM

## 2016-12-28 DIAGNOSIS — N410 Acute prostatitis: Secondary | ICD-10-CM

## 2016-12-28 DIAGNOSIS — Z23 Encounter for immunization: Secondary | ICD-10-CM | POA: Diagnosis not present

## 2016-12-28 NOTE — Progress Notes (Signed)
Pt reports for U/A & U/C today. Pt reports he has finished ABX & has no issues at this time. Pt was also given HD flu w/o issues as well.

## 2016-12-28 NOTE — Addendum Note (Signed)
Addended by: Valrie Hart C on: 12/28/2016 03:12 PM   Modules accepted: Orders

## 2016-12-29 ENCOUNTER — Ambulatory Visit (INDEPENDENT_AMBULATORY_CARE_PROVIDER_SITE_OTHER): Payer: 59

## 2016-12-29 DIAGNOSIS — I2699 Other pulmonary embolism without acute cor pulmonale: Secondary | ICD-10-CM

## 2016-12-29 DIAGNOSIS — Z7901 Long term (current) use of anticoagulants: Secondary | ICD-10-CM

## 2016-12-29 DIAGNOSIS — Z9889 Other specified postprocedural states: Secondary | ICD-10-CM | POA: Diagnosis not present

## 2016-12-29 LAB — URINE CULTURE
MICRO NUMBER: 81124551
Result:: NO GROWTH
SPECIMEN QUALITY: ADEQUATE

## 2016-12-29 LAB — POCT INR: INR: 3.2

## 2016-12-29 LAB — URINALYSIS, ROUTINE W REFLEX MICROSCOPIC
BILIRUBIN URINE: NEGATIVE
GLUCOSE, UA: NEGATIVE
HGB URINE DIPSTICK: NEGATIVE
Ketones, ur: NEGATIVE
LEUKOCYTES UA: NEGATIVE
Nitrite: NEGATIVE
Protein, ur: NEGATIVE
Specific Gravity, Urine: 1.018 (ref 1.001–1.03)

## 2016-12-31 ENCOUNTER — Other Ambulatory Visit: Payer: Self-pay

## 2016-12-31 DIAGNOSIS — Z1212 Encounter for screening for malignant neoplasm of rectum: Secondary | ICD-10-CM

## 2016-12-31 LAB — POC HEMOCCULT BLD/STL (HOME/3-CARD/SCREEN)
Card #2 Fecal Occult Blod, POC: NEGATIVE
FECAL OCCULT BLD: NEGATIVE
FECAL OCCULT BLD: NEGATIVE

## 2017-01-05 ENCOUNTER — Other Ambulatory Visit: Payer: Self-pay | Admitting: Physician Assistant

## 2017-01-05 DIAGNOSIS — E782 Mixed hyperlipidemia: Secondary | ICD-10-CM

## 2017-01-05 MED FILL — EZETIMIBE 10 MG TABS: 10 | 90 days supply | Qty: 90 | Fill #0

## 2017-01-14 ENCOUNTER — Ambulatory Visit (INDEPENDENT_AMBULATORY_CARE_PROVIDER_SITE_OTHER): Payer: 59 | Admitting: *Deleted

## 2017-01-14 DIAGNOSIS — Z5181 Encounter for therapeutic drug level monitoring: Secondary | ICD-10-CM

## 2017-01-14 DIAGNOSIS — Z7901 Long term (current) use of anticoagulants: Secondary | ICD-10-CM | POA: Diagnosis not present

## 2017-01-14 DIAGNOSIS — Z9889 Other specified postprocedural states: Secondary | ICD-10-CM | POA: Diagnosis not present

## 2017-01-14 DIAGNOSIS — I2699 Other pulmonary embolism without acute cor pulmonale: Secondary | ICD-10-CM

## 2017-01-14 LAB — POCT INR: INR: 2.9

## 2017-02-09 ENCOUNTER — Ambulatory Visit (INDEPENDENT_AMBULATORY_CARE_PROVIDER_SITE_OTHER): Payer: 59 | Admitting: *Deleted

## 2017-02-09 DIAGNOSIS — Z9889 Other specified postprocedural states: Secondary | ICD-10-CM | POA: Diagnosis not present

## 2017-02-09 DIAGNOSIS — I2699 Other pulmonary embolism without acute cor pulmonale: Secondary | ICD-10-CM

## 2017-02-09 DIAGNOSIS — Z7901 Long term (current) use of anticoagulants: Secondary | ICD-10-CM

## 2017-02-09 LAB — POCT INR: INR: 2.5

## 2017-02-09 NOTE — Patient Instructions (Signed)
Continue taking  same dosage 1 tablet daily except 2 tablets on Mondays, Wednesdays and Fridays.  Recheck in 4 weeks. Please call with any medication changes or if scheduled for any procedures [facility]# [phone]. Main # [phone].

## 2017-02-18 ENCOUNTER — Ambulatory Visit (INDEPENDENT_AMBULATORY_CARE_PROVIDER_SITE_OTHER): Payer: 59 | Admitting: Physician Assistant

## 2017-02-18 ENCOUNTER — Encounter: Payer: Self-pay | Admitting: Physician Assistant

## 2017-02-18 VITALS — BP 128/86 | HR 74 | Temp 97.5°F | Resp 14 | Ht 72.5 in | Wt 220.2 lb

## 2017-02-18 DIAGNOSIS — E782 Mixed hyperlipidemia: Secondary | ICD-10-CM | POA: Diagnosis not present

## 2017-02-18 DIAGNOSIS — G8929 Other chronic pain: Secondary | ICD-10-CM | POA: Diagnosis not present

## 2017-02-18 DIAGNOSIS — E1122 Type 2 diabetes mellitus with diabetic chronic kidney disease: Secondary | ICD-10-CM | POA: Diagnosis not present

## 2017-02-18 DIAGNOSIS — N183 Chronic kidney disease, stage 3 (moderate): Secondary | ICD-10-CM | POA: Diagnosis not present

## 2017-02-18 DIAGNOSIS — I10 Essential (primary) hypertension: Secondary | ICD-10-CM | POA: Diagnosis not present

## 2017-02-18 DIAGNOSIS — M25511 Pain in right shoulder: Secondary | ICD-10-CM

## 2017-02-18 DIAGNOSIS — Z79899 Other long term (current) drug therapy: Secondary | ICD-10-CM | POA: Diagnosis not present

## 2017-02-18 NOTE — Progress Notes (Signed)
Patient ID: [name], male   DOB: 1945-10-17, 71 y.o.   MRN: [id number]  Assessment and Plan:  Hypertension:  -Continue medication -monitor blood pressure at home. -Continue DASH diet -Reminder to go to the ER if any CP, SOB, nausea, dizziness, severe HA, changes vision/speech, left arm numbness and tingling and jaw pain.  Cholesterol - Continue diet and exercise -Check cholesterol.   Diabetes with diabetic chronic kidney disease -Continue diet and exercise.  -Check A1C  Vitamin D Def -check level -continue medications.   Obesity with co morbidities - long discussion about weight loss, diet, and exercise  Chronic right shoulder pain -     Ambulatory referral to Orthopedics - lidocaine patches, tylenol, exercises given will avoid NSAIDS/prednisone due to coumadin and DM   Continue diet and meds as discussed. Further disposition pending results of labs. Discussed med's effects and SE's.    HPI 71 y.o. right handed AA male  presents for 3 month follow up with hypertension, hyperlipidemia, diabetes and vitamin D deficiency.  Right shoulder pain x several months, no injury, has limited ROM. Some pain down his arm, no weakness, numbness,tingling.    His blood pressure has been controlled at home, today their BP is BP: 128/86.He does not workout at this time.  He denies chest pain, shortness of breath, dizziness.    He has history of MVR and is on coumadin, follows with coumadin clinic.  Lab Results  Component Value Date   INR 2.5 02/09/2017   INR 2.9 01/14/2017   INR 3.2 12/29/2016   PROTIME 18.2 09/03/2008     He is on cholesterol medication and denies myalgias. His cholesterol is at goal. The cholesterol was:  10/19/2016: Cholesterol 170; HDL 49; LDL Cholesterol 107; Triglycerides 70   He has been working on diet and exercise for diabetes with diabetic chronic kidney disease, he is on bASA, he is on ACE/ARB, he does not check his sugars at home, and denies  foot  ulcerations, hyperglycemia, hypoglycemia , increased appetite, nausea, paresthesia of the feet, polydipsia, polyuria, visual disturbances, vomiting and weight loss. Last A1C was: 10/19/2016: Hgb A1c MFr Bld 6.6   Patient is on Vitamin D supplement. 10/19/2016: Vit D, 25-Hydroxy 60  BMI is Body mass index is 29.45 kg/m., he is working on diet and exercise. Wt Readings from Last 3 Encounters:  02/18/17 220 lb 3.2 oz (99.9 kg)  11/09/16 223 lb 3.2 oz (101.2 kg)  10/19/16 224 lb 12.8 oz (102 kg)    Current Medications:  Current Outpatient Medications on File Prior to Visit  Medication Sig Dispense Refill  . acetaminophen (TYLENOL) 500 MG tablet Take 500 mg by mouth every 6 (six) hours as needed for pain.    . benazepril (LOTENSIN) 20 MG tablet TAKE 1 TABLET BY MOUTH ONCE DAILY 90 tablet 1  . Cholecalciferol (VITAMIN D PO) Take by mouth daily. TAKING 5,000 UNITS DAILY    . CINNAMON PO Takes 2 tablets (2000mg  ) daily    . cyclobenzaprine (FLEXERIL) 10 MG tablet Take 1 tablet (10 mg total) by mouth 2 (two) times daily as needed for muscle spasms. 20 tablet 0  . ezetimibe (ZETIA) 10 MG tablet TAKE 1 TABLET BY MOUTH DAILY FOR CHOLESTEROL 90 tablet 0  . fenofibrate micronized (LOFIBRA) 134 MG capsule TAKE 1 CAPSULE BY MOUTH EVERY EVENING. 90 capsule 3  . loratadine (CLARITIN) 10 MG tablet Take 10 mg by mouth daily as needed for allergies.    . mometasone (NASONEX) 50  MCG/ACT nasal spray Place 2 sprays into the nose daily as needed (allergies).     . Psyllium ([employer] NATURAL DAILY FIBER PO) Take 1 tablet by mouth daily.    . tamsulosin (FLOMAX) 0.4 MG CAPS capsule Take 1 capsule at bedtime for prostate 30 capsule 2  . warfarin (COUMADIN) 5 MG tablet TAKE AS DIRECTED BY COUMADIN CLINIC. 150 tablet 1   No current facility-administered medications on file prior to visit.    Medical History:  Past Medical History:  Diagnosis Date  . Allergy    SEASONAL  . Cataract    SMALL IN LEFT EYE  . Coronary  artery disease    non-obstructive by cath 2009  . Degenerative joint disease   . Heart murmur   . Hyperlipidemia   . Hypertension    Clearance with note Dr Marvel PlanMcGowan on chart,  Lovonox bridging  ordered by Dr Sanjuana KavaMcalhaney    EKG 9/12, chst 9/12 EPIC  . Inguinal hernia    right  . Osteoarthritis   . Peripheral vascular disease (HCC)   . Pre-diabetes   . Pulmonary embolism [facility](HCC)    march 2010, 2012              /   DVT x  2  LEFT 2010  . S/P mitral valve repair 08/15/2012   Complex valvuloplasty including triangular resection of posterior leaflet, artificial Gore-tex neocord placement x4 and Sorin Memo 3D ring annuloplasty via right mini thoracotomy approach  . Seasonal allergies   . Severe mitral regurgitation by prior echocardiogram   . Sleep apnea    STOP BANG SCORE 4  . Vitamin D deficiency   . Wears glasses    Allergies:  Allergies  Allergen Reactions  . Statins     REACTION: muscle aches  . Welchol [Colesevelam Hcl]     unknown     Review of Systems:  Review of Systems  Constitutional: Negative for chills and fever.  HENT: Positive for congestion. Negative for ear pain and sore throat.   Eyes: Negative.   Respiratory: Negative for cough, shortness of breath and wheezing.   Cardiovascular: Negative for chest pain, palpitations and leg swelling.  Gastrointestinal: Negative for blood in stool, constipation, diarrhea, heartburn and melena.  Genitourinary: Negative.   Musculoskeletal: Positive for joint pain.  Skin: Negative.   Neurological: Negative for dizziness, loss of consciousness and headaches.  Psychiatric/Behavioral: Negative for depression. The patient is not nervous/anxious and does not have insomnia.     Family history- Review and unchanged  Social history- Review and unchanged  Physical Exam: BP 128/86   Pulse 74   Temp (!) 97.5 F (36.4 C)   Resp 14   Ht 6' 0.5" (1.842 m)   Wt 220 lb 3.2 oz (99.9 kg)   SpO2 97%   BMI 29.45 kg/m  Wt Readings from  Last 3 Encounters:  02/18/17 220 lb 3.2 oz (99.9 kg)  11/09/16 223 lb 3.2 oz (101.2 kg)  10/19/16 224 lb 12.8 oz (102 kg)   General Appearance: Well nourished well developed, non-toxic appearing, in no apparent distress. Eyes: PERRLA, EOMs, conjunctiva no swelling or erythema ENT/Mouth: Ear canals clear with no erythema, swelling, or discharge.  TMs normal bilaterally, oropharynx clear, moist, with no exudate.  Crowded mouth.  Neck: Supple, thyroid normal, no JVD, no cervical adenopathy.  Respiratory: Respiratory effort normal, breath sounds clear A&P, no wheeze, rhonchi or rales noted.  No retractions, no accessory muscle usage Cardio: RRR with no MRGs. No  noted edema.  Abdomen: Soft, + BS.  Non tender, obese. Musculoskeletal: Full ROM, 5/5 strength, Normal gait, right shoulder no deformity, pain at bicep tendon and subacromial bursa, pain with abduction to 90 degrees and limited ROM above this due to pain, pain with external rotations, no impingement signs, negative empty can test Skin: Warm, dry without rashes, lesions, ecchymosis.  Neuro: Awake and oriented X 3, Cranial nerves intact. No cerebellar symptoms.  Psych: normal affect, Insight and Judgment appropriate.    Quentin Mulling, PA-C 8:56 AM [facility]

## 2017-02-18 NOTE — Patient Instructions (Addendum)
Can do a steroid nasal spary 1-2 sparys at night each nostril. Remember to spray each nostril twice towards the outer part of your eye.  Do not sniff but instead pinch your nose and tilt your head back to help the medicine get into your sinuses.  The best time to do this is at bedtime. Stop if you get blurred vision or nose bleeds.   You can take tylenol (500mg ) or tylenol arthritis (650mg ) with the meloxicam/antiinflammatories. The max you can take of tylenol a day is 3000mg  daily, this is a max of 6 pills a day of the regular tyelnol (500mg ) or a max of 4 a day of the tylenol arthritis (650mg ) as long as no other medications you are taking contain tylenol.   salonpas patches See exercises below  HOW TO TREAT VIRAL COUGH AND COLD SYMPTOMS:  -Symptoms usually last at least 1 week with the worst symptoms being around day 4.  - colds usually start with a sore throat and end with a cough, and the cough can take 2 weeks to get better.  -No antibiotics are needed for colds, flu, sore throats, cough, bronchitis UNLESS symptoms are longer than 7 days OR if you are getting better then get drastically worse.  -There are a lot of combination medications (Dayquil, Nyquil, Vicks 44, tyelnol cold and sinus, ETC). Please look at the ingredients on the back so that you are treating the correct symptoms and not doubling up on medications/ingredients.    Medicines you can use  Nasal congestion  - pseudoephedrine (Sudafed)- behind the counter, do not use if you have high blood pressure, medicine that have -D in them.  - phenylephrine (Sudafed PE) -Dextormethorphan + chlorpheniramine (Coridcidin HBP)- okay if you have high blood pressure -Oxymetazoline (Afrin) nasal spray- LIMIT to 3 days -Saline nasal spray -Neti pot (used distilled or bottled water)  Ear pain/congestion  -pseudoephedrine (sudafed) - Nasonex/flonase nasal spray  Fever  -Acetaminophen (Tyelnol) -Ibuprofen (Advil, motrin, aleve)  Sore  Throat  -Acetaminophen (Tyelnol) -Ibuprofen (Advil, motrin, aleve) -Drink a lot of water -Gargle with salt water - Rest your voice (don't talk) -Throat sprays -Cough drops  Body Aches  -Acetaminophen (Tyelnol) -Ibuprofen (Advil, motrin, aleve)  Headache  -Acetaminophen (Tyelnol) -Ibuprofen (Advil, motrin, aleve) - Exedrin, Exedrin Migraine  Allergy symptoms (cough, sneeze, runny nose, itchy eyes) -Claritin or loratadine cheapest but likely the weakest  -Zyrtec or certizine at night because it can make you sleepy -The strongest is allegra or fexafinadine  Cheapest at [employer], [employer], [employer]  Cough  -Dextromethorphan (Delsym)- medicine that has DM in it -Guafenesin (Mucinex/Robitussin) - cough drops - drink lots of water  Chest Congestion  -Guafenesin (Mucinex/Robitussin)  Red Itchy Eyes  - Naphcon-A  Upset Stomach  - Bland diet (nothing spicy, greasy, fried, and high acid foods like tomatoes, oranges, berries) -OKAY- cereal, bread, soup, crackers, rice -Eat smaller more frequent meals -reduce caffeine, no alcohol -Loperamide (Imodium-AD) if diarrhea -Prevacid for heart burn  General health when sick  -Hydration -wash your hands frequently -keep surfaces clean -change pillow cases and sheets often -Get fresh air but do not exercise strenuously -Vitamin D, double up on it - Vitamin C -Zinc    Rotator Cuff Tendinitis Rotator cuff tendinitis is inflammation of the tough, cord-like bands that connect muscle to bone (tendons) in the rotator cuff. The rotator cuff includes all of the muscles and tendons that connect the arm to the shoulder. The rotator cuff holds the head of the upper arm  bone (humerus) in the cup (fossa) of the shoulder blade (scapula). This condition can lead to a long-lasting (chronic) tear. The tear may be partial or complete. What are the causes? This condition is usually caused by overusing the rotator cuff. What increases the risk? This  condition is more likely to develop in athletes and workers who frequently use their shoulder or reach over their heads. This can include activities such as:  Tennis.  Baseball or softball.  Swimming.  Construction work.  Painting.  What are the signs or symptoms? Symptoms of this condition include:  Pain spreading (radiating) from the shoulder to the upper arm.  Swelling and tenderness in front of the shoulder.  Pain when reaching, pulling, or lifting the arm above the head.  Pain when lowering the arm from above the head.  Minor pain in the shoulder when resting.  Increased pain in the shoulder at night.  Difficulty placing the arm behind the back.  How is this diagnosed? This condition is diagnosed with a medical history and physical exam. Tests may also be done, including:  X-rays.  MRI.  Ultrasounds.  CT or MR arthrogram. During this test, a contrast material is injected and then images are taken.  How is this treated? Treatment for this condition depends on the severity of the condition. In less severe cases, treatment may include:  Rest. This may be done with a sling that holds the shoulder still (immobilization). Your health care provider may also recommend avoiding activities that involve lifting your arm over your head.  Icing the shoulder.  Anti-inflammatory medicines, such as aspirin or ibuprofen.  In more severe cases, treatment may include:  Physical therapy.  Steroid injections.  Surgery.  Follow these instructions at home: If you have a sling:  Wear the sling as told by your health care provider. Remove it only as told by your health care provider.  Loosen the sling if your fingers tingle, become numb, or turn cold and blue.  Keep the sling clean.  If the sling is not waterproof, do not let it get wet. Remove it, if allowed, or cover it with a watertight covering when you take a bath or shower. Managing pain, stiffness, and  swelling  If directed, put ice on the injured area. ? If you have a removable sling, remove it as told by your health care provider. ? Put ice in a plastic bag. ? Place a towel between your skin and the bag. ? Leave the ice on for 20 minutes, 2-3 times a day.  Move your fingers often to avoid stiffness and to lessen swelling.  Raise (elevate) the injured area above the level of your heart while you are lying down.  Find a comfortable sleeping position or sleep on a recliner, if available. Driving  Do not drive or use heavy machinery while taking prescription pain medicine.  Ask your health care provider when it is safe to drive if you have a sling on your arm. Activity  Rest your shoulder as told by your health care provider.  Return to your normal activities as told by your health care provider. Ask your health care provider what activities are safe for you.  Do any exercises or stretches as told by your health care provider.  If you do repetitive overhead tasks, take small breaks in between and include stretching exercises as told by your health care provider. General instructions  Do not use any products that contain nicotine or tobacco, such as cigarettes  and e-cigarettes. These can delay healing. If you need help quitting, ask your health care provider.  Take over-the-counter and prescription medicines only as told by your health care provider.  Keep all follow-up visits as told by your health care provider. This is important. Contact a health care provider if:  Your pain gets worse.  You have new pain in your arm, hands, or fingers.  Your pain is not relieved with medicine or does not get better after 6 weeks of treatment.  You have cracking sensations when moving your shoulder in certain directions.  You hear a snapping sound after using your shoulder, followed by severe pain and weakness. Get help right away if:  Your arm, hand, or fingers are numb or  tingling.  Your arm, hand, or fingers are swollen or painful or they turn white or blue. Summary  Rotator cuff tendinitis is inflammation of the tough, cord-like bands that connect muscle to bone (tendons) in the rotator cuff.  This condition is usually caused by overusing the rotator cuff, which includes all of the muscles and tendons that connect the arm to the shoulder.  This condition is more likely to develop in athletes and workers who frequently use their shoulder or reach over their heads.  Treatment generally includes rest, anti-inflammatory medicines, and icing. In some cases, physical therapy and steroid injections may be needed. In severe cases, surgery may be needed. This information is not intended to replace advice given to you by your health care provider. Make sure you discuss any questions you have with your health care provider. Document Released: 05/29/2003 Document Revised: 02/23/2016 Document Reviewed: 02/23/2016 Elsevier Interactive Patient Education  2017 [employer]   Shoulder Impingement Syndrome Rehab Ask your health care provider which exercises are safe for you. Do exercises exactly as told by your health care provider and adjust them as directed. It is normal to feel mild stretching, pulling, tightness, or discomfort as you do these exercises, but you should stop right away if you feel sudden pain or your pain gets worse.Do not begin these exercises until told by your health care provider. Stretching and range of motion exercise This exercise warms up your muscles and joints and improves the movement and flexibility of your shoulder. This exercise also helps to relieve pain and stiffness. Exercise A: Passive horizontal adduction  1. Sit or stand and pull your left / right elbow across your chest, toward your other shoulder. Stop when you feel a gentle stretch in the back of your shoulder and upper arm. ? Keep your arm at shoulder height. ? Keep your arm as  close to your body as you comfortably can. 2. Hold for __________ seconds. 3. Slowly return to the starting position. Repeat __________ times. Complete this exercise __________ times a day. Strengthening exercises These exercises build strength and endurance in your shoulder. Endurance is the ability to use your muscles for a long time, even after they get tired. Exercise B: External rotation, isometric 1. Stand or sit in a doorway, facing the door frame. 2. Bend your left / right elbow and place the back of your wrist against the door frame. Only your wrist should be touching the frame. Keep your upper arm at your side. 3. Gently press your wrist against the door frame, as if you are trying to push your arm away from your abdomen. ? Avoid shrugging your shoulder while you press your hand against the door frame. Keep your shoulder blade tucked down toward the middle  of your back. 4. Hold for __________ seconds. 5. Slowly release the tension, and relax your muscles completely before you do the exercise again. Repeat __________ times. Complete this exercise __________ times a day. Exercise C: Internal rotation, isometric  1. Stand or sit in a doorway, facing the door frame. 2. Bend your left / right elbow and place the inside of your wrist against the door frame. Only your wrist should be touching the frame. Keep your upper arm at your side. 3. Gently press your wrist against the door frame, as if you are trying to push your arm toward your abdomen. ? Avoid shrugging your shoulder while you press your hand against the door frame. Keep your shoulder blade tucked down toward the middle of your back. 4. Hold for __________ seconds. 5. Slowly release the tension, and relax your muscles completely before you do the exercise again. Repeat __________ times. Complete this exercise __________ times a day. Exercise D: Scapular protraction, supine  1. Lie on your back on a firm surface. Hold a __________  weight in your left / right hand. 2. Raise your left / right arm straight into the air so your hand is directly above your shoulder joint. 3. Push the weight into the air so your shoulder lifts off of the surface that you are lying on. Do not move your head, neck, or back. 4. Hold for __________ seconds. 5. Slowly return to the starting position. Let your muscles relax completely before you repeat this exercise. Repeat __________ times. Complete this exercise __________ times a day. Exercise E: Scapular retraction  1. Sit in a stable chair without armrests, or stand. 2. Secure an exercise band to a stable object in front of you so the band is at shoulder height. 3. Hold one end of the exercise band in each hand. Your palms should face down. 4. Squeeze your shoulder blades together and move your elbows slightly behind you. Do not shrug your shoulders while you do this. 5. Hold for __________ seconds. 6. Slowly return to the starting position. Repeat __________ times. Complete this exercise __________ times a day. Exercise F: Shoulder extension  1. Sit in a stable chair without armrests, or stand. 2. Secure an exercise band to a stable object in front of you where the band is above shoulder height. 3. Hold one end of the exercise band in each hand. 4. Straighten your elbows and lift your hands up to shoulder height. 5. Squeeze your shoulder blades together and pull your hands down to the sides of your thighs. Stop when your hands are straight down by your sides. Do not let your hands go behind your body. 6. Hold for __________ seconds. 7. Slowly return to the starting position. Repeat __________ times. Complete this exercise __________ times a day. This information is not intended to replace advice given to you by your health care provider. Make sure you discuss any questions you have with your health care provider. Document Released: 03/08/2005 Document Revised: 11/13/2015 Document Reviewed:  02/08/2015 Elsevier Interactive Patient Education  [employer]2018 [employer]

## 2017-02-19 LAB — CBC WITH DIFFERENTIAL/PLATELET
Basophils Absolute: 70 cells/uL (ref 0–200)
Basophils Relative: 1.8 %
EOS PCT: 2.3 %
Eosinophils Absolute: 90 cells/uL (ref 15–500)
HCT: 49.4 % (ref 38.5–50.0)
Hemoglobin: 16.6 g/dL (ref 13.2–17.1)
Lymphs Abs: 1232 cells/uL (ref 850–3900)
MCH: 30.2 pg (ref 27.0–33.0)
MCHC: 33.6 g/dL (ref 32.0–36.0)
MCV: 90 fL (ref 80.0–100.0)
MPV: 12.8 fL — ABNORMAL HIGH (ref 7.5–12.5)
Monocytes Relative: 6.2 %
NEUTROS PCT: 58.1 %
Neutro Abs: 2266 cells/uL (ref 1500–7800)
PLATELETS: 152 10*3/uL (ref 140–400)
RBC: 5.49 10*6/uL (ref 4.20–5.80)
RDW: 13.1 % (ref 11.0–15.0)
TOTAL LYMPHOCYTE: 31.6 %
WBC mixed population: 242 cells/uL (ref 200–950)
WBC: 3.9 10*3/uL (ref 3.8–10.8)

## 2017-02-19 LAB — BASIC METABOLIC PANEL WITH GFR
BUN/Creatinine Ratio: 14 (calc) (ref 6–22)
BUN: 22 mg/dL (ref 7–25)
CALCIUM: 9.9 mg/dL (ref 8.6–10.3)
CHLORIDE: 104 mmol/L (ref 98–110)
CO2: 26 mmol/L (ref 20–32)
Creat: 1.58 mg/dL — ABNORMAL HIGH (ref 0.70–1.18)
GFR, Est African American: 51 mL/min/{1.73_m2} — ABNORMAL LOW (ref >=60)
GFR, Est Non African American: 44 mL/min/{1.73_m2} — ABNORMAL LOW (ref >=60)
GLUCOSE: 103 mg/dL — AB (ref 65–99)
POTASSIUM: 4.2 mmol/L (ref 3.5–5.3)
Sodium: 140 mmol/L (ref 135–146)

## 2017-02-19 LAB — MAGNESIUM: MAGNESIUM: 1.9 mg/dL (ref 1.5–2.5)

## 2017-02-19 LAB — HEPATIC FUNCTION PANEL
AG Ratio: 1.8 (calc) (ref 1.0–2.5)
ALBUMIN MSPROF: 4.8 g/dL (ref 3.6–5.1)
ALT: 19 U/L (ref 9–46)
AST: 26 U/L (ref 10–35)
Alkaline phosphatase (APISO): 53 U/L (ref 40–115)
BILIRUBIN DIRECT: 0.1 mg/dL (ref 0.0–0.2)
Globulin: 2.6 g/dL (calc) (ref 1.9–3.7)
Indirect Bilirubin: 0.7 mg/dL (calc) (ref 0.2–1.2)
TOTAL PROTEIN: 7.4 g/dL (ref 6.1–8.1)
Total Bilirubin: 0.8 mg/dL (ref 0.2–1.2)

## 2017-02-19 LAB — HEMOGLOBIN A1C
Hgb A1c MFr Bld: 6.3 % of total Hgb — ABNORMAL HIGH (ref <5.7)
Mean Plasma Glucose: 134 (calc)
eAG (mmol/L): 7.4 (calc)

## 2017-02-19 LAB — LIPID PANEL
Cholesterol: 205 mg/dL — ABNORMAL HIGH (ref <200)
HDL: 48 mg/dL (ref >40)
LDL Cholesterol (Calc): 134 mg/dL (calc) — ABNORMAL HIGH
Non-HDL Cholesterol (Calc): 157 mg/dL (calc) — ABNORMAL HIGH (ref <130)
TRIGLYCERIDES: 119 mg/dL (ref <150)
Total CHOL/HDL Ratio: 4.3 (calc) (ref <5.0)

## 2017-02-19 LAB — TSH: TSH: 1.09 m[IU]/L (ref 0.40–4.50)

## 2017-03-09 ENCOUNTER — Ambulatory Visit (INDEPENDENT_AMBULATORY_CARE_PROVIDER_SITE_OTHER): Payer: 59 | Admitting: *Deleted

## 2017-03-09 DIAGNOSIS — I2699 Other pulmonary embolism without acute cor pulmonale: Secondary | ICD-10-CM

## 2017-03-09 DIAGNOSIS — Z5181 Encounter for therapeutic drug level monitoring: Secondary | ICD-10-CM

## 2017-03-09 DIAGNOSIS — Z9889 Other specified postprocedural states: Secondary | ICD-10-CM | POA: Diagnosis not present

## 2017-03-09 DIAGNOSIS — Z7901 Long term (current) use of anticoagulants: Secondary | ICD-10-CM

## 2017-03-09 LAB — POCT INR: INR: 2.9

## 2017-03-09 NOTE — Patient Instructions (Signed)
Description   Continue taking  same dosage 1 tablet daily except 2 tablets on Mondays, Wednesdays and Fridays.  Recheck in 4 weeks. Please call with any medication changes or if scheduled for any procedures [facility] [phone]. Main # [phone].

## 2017-03-14 MED FILL — FENOFIBRATE 134 MG CAPSULE: 134 | 90 days supply | Qty: 90 | Fill #1

## 2017-03-14 MED FILL — BENAZEPRIL HCL 20 MG TABLET: 20 | 90 days supply | Qty: 90 | Fill #1

## 2017-03-17 DIAGNOSIS — M25521 Pain in right elbow: Secondary | ICD-10-CM | POA: Diagnosis not present

## 2017-03-17 DIAGNOSIS — M7541 Impingement syndrome of right shoulder: Secondary | ICD-10-CM | POA: Diagnosis not present

## 2017-03-17 DIAGNOSIS — M25511 Pain in right shoulder: Secondary | ICD-10-CM | POA: Diagnosis not present

## 2017-03-25 ENCOUNTER — Telehealth: Payer: Self-pay | Admitting: *Deleted

## 2017-03-25 ENCOUNTER — Other Ambulatory Visit: Payer: Self-pay | Admitting: *Deleted

## 2017-03-25 DIAGNOSIS — N401 Enlarged prostate with lower urinary tract symptoms: Secondary | ICD-10-CM

## 2017-03-25 MED ORDER — TAMSULOSIN HCL 0.4 MG PO CAPS: ORAL_CAPSULE | ORAL | 1 refills | Status: DC

## 2017-03-25 MED FILL — TAMSULOSIN HCL 0.4 MG CAP: 0.4 | 30 days supply | Qty: 30 | Fill #0

## 2017-03-25 NOTE — Telephone Encounter (Signed)
Patient called and complained of frequent urination.  Per Dr Oneta Rack, he will refill the Tamsulosin, but if any other UTI symptoms, he will need an office visit.

## 2017-03-28 ENCOUNTER — Telehealth: Payer: Self-pay | Admitting: *Deleted

## 2017-03-28 ENCOUNTER — Other Ambulatory Visit: Payer: Self-pay | Admitting: Cardiovascular Disease

## 2017-03-28 MED FILL — WARFARIN SODIUM 5 MG TABLET: 5 | 90 days supply | Qty: 150 | Fill #0

## 2017-03-28 NOTE — Telephone Encounter (Signed)
Pt called to report that he started Tamsulosin 0.4mg  daily; advised pt that the med does not interact with Coumadin & is safe to take with Coumadin. He verbalized understanding & will call back if any other changes or with any questions.

## 2017-04-07 ENCOUNTER — Other Ambulatory Visit: Payer: Self-pay | Admitting: Internal Medicine

## 2017-04-07 DIAGNOSIS — E782 Mixed hyperlipidemia: Secondary | ICD-10-CM

## 2017-04-08 ENCOUNTER — Ambulatory Visit (INDEPENDENT_AMBULATORY_CARE_PROVIDER_SITE_OTHER): Payer: 59 | Admitting: *Deleted

## 2017-04-08 DIAGNOSIS — Z9889 Other specified postprocedural states: Secondary | ICD-10-CM

## 2017-04-08 DIAGNOSIS — Z7901 Long term (current) use of anticoagulants: Secondary | ICD-10-CM | POA: Diagnosis not present

## 2017-04-08 DIAGNOSIS — I2699 Other pulmonary embolism without acute cor pulmonale: Secondary | ICD-10-CM | POA: Diagnosis not present

## 2017-04-08 LAB — POCT INR: INR: 2.4

## 2017-04-08 MED FILL — EZETIMIBE 10 MG TABS: 10 | 90 days supply | Qty: 90 | Fill #0

## 2017-04-08 NOTE — Patient Instructions (Signed)
Description   Continue taking  same dosage 1 tablet daily except 2 tablets on Mondays, Wednesdays and Fridays.  Recheck in 6 weeks. Please call with any medication changes or if scheduled for any procedures [facility] [phone]. Main # [phone].

## 2017-04-27 MED FILL — TAMSULOSIN HCL 0.4 MG CAP: 0.4 | 30 days supply | Qty: 30 | Fill #1

## 2017-05-20 ENCOUNTER — Encounter (INDEPENDENT_AMBULATORY_CARE_PROVIDER_SITE_OTHER): Payer: Self-pay

## 2017-05-20 ENCOUNTER — Ambulatory Visit (INDEPENDENT_AMBULATORY_CARE_PROVIDER_SITE_OTHER): Payer: 59 | Admitting: *Deleted

## 2017-05-20 DIAGNOSIS — I2699 Other pulmonary embolism without acute cor pulmonale: Secondary | ICD-10-CM | POA: Diagnosis not present

## 2017-05-20 DIAGNOSIS — Z7901 Long term (current) use of anticoagulants: Secondary | ICD-10-CM | POA: Diagnosis not present

## 2017-05-20 DIAGNOSIS — Z9889 Other specified postprocedural states: Secondary | ICD-10-CM

## 2017-05-20 LAB — POCT INR: INR: 2.6

## 2017-05-20 NOTE — Patient Instructions (Signed)
Description   Continue taking  same dosage 1 tablet daily except 2 tablets on Mondays, Wednesdays and Fridays.  Recheck in 6 weeks. Please call with any medication changes or if scheduled for any procedures [facility] [phone]. Main # [phone].     

## 2017-05-24 ENCOUNTER — Encounter: Payer: Self-pay | Admitting: Internal Medicine

## 2017-05-24 ENCOUNTER — Ambulatory Visit (INDEPENDENT_AMBULATORY_CARE_PROVIDER_SITE_OTHER): Payer: 59 | Admitting: Internal Medicine

## 2017-05-24 VITALS — BP 110/66 | HR 68 | Temp 97.5°F | Resp 18 | Ht 72.5 in | Wt 221.4 lb

## 2017-05-24 DIAGNOSIS — E782 Mixed hyperlipidemia: Secondary | ICD-10-CM | POA: Diagnosis not present

## 2017-05-24 DIAGNOSIS — E1122 Type 2 diabetes mellitus with diabetic chronic kidney disease: Secondary | ICD-10-CM

## 2017-05-24 DIAGNOSIS — E559 Vitamin D deficiency, unspecified: Secondary | ICD-10-CM

## 2017-05-24 DIAGNOSIS — Z79899 Other long term (current) drug therapy: Secondary | ICD-10-CM

## 2017-05-24 DIAGNOSIS — I10 Essential (primary) hypertension: Secondary | ICD-10-CM

## 2017-05-24 DIAGNOSIS — Z9889 Other specified postprocedural states: Secondary | ICD-10-CM

## 2017-05-24 DIAGNOSIS — N183 Chronic kidney disease, stage 3 (moderate): Secondary | ICD-10-CM | POA: Diagnosis not present

## 2017-05-24 NOTE — Patient Instructions (Signed)

## 2017-05-24 NOTE — Progress Notes (Signed)
This very nice 72 y.o. MBM presents for 6 month follow up with HTN, HLD, T2_DM and Vitamin D Deficiency.      Patient is treated for HTN (2005) & BP has been controlled at home. Today's BP is at goal - 110/66.   Patient had a  Negative heart cath for CAD in 2009.  In 2014, he had Mitral Valvuloplasty by Dr Barry Dienes. Patient has hx/o PE in 2010 and 2012 and has been on Coumadin since 2012 followed at [facility]. Patient denies complaints of any cardiac type chest pain, palpitations, dyspnea / orthopnea / PND, dizziness, claudication, or dependent edema.     Hyperlipidemia is not controlled with diet & meds. Patient denies myalgias or other med SE's. Last Lipids were near goal: Lab Results  Component Value Date   CHOL 205 (H) 02/18/2017   HDL 48 02/18/2017   LDLCALC 107 (H) 10/19/2016   TRIG 119 02/18/2017   CHOLHDL 4.3 02/18/2017      Also, the patient has history of T2_NIDDM circa 2002 and he has CKD3 (GFR 46)  and he denies symptoms of reactive hypoglycemia, diabetic polys, paresthesias or visual blurring.  Last A1c was not at goal: Lab Results  Component Value Date   HGBA1C 6.3 (H) 02/18/2017      Further, the patient also has history of Vitamin D Deficiency ("30"/2013) and supplements vitamin D without any suspected side-effects. Last vitamin D was at goal: Lab Results  Component Value Date   VD25OH 60 10/19/2016   Current Outpatient Medications on File Prior to Visit  Medication Sig  . acetaminophen (TYLENOL) 500 MG tablet Take 500 mg by mouth every 6 (six) hours as needed for pain.  . benazepril (LOTENSIN) 20 MG tablet TAKE 1 TABLET BY MOUTH ONCE DAILY  . Cholecalciferol (VITAMIN D PO) Take by mouth daily. TAKING 5,000 UNITS DAILY  . CINNAMON PO Takes 2 tablets (2000mg  ) daily  . cyclobenzaprine (FLEXERIL) 10 MG tablet Take 1 tablet (10 mg total) by mouth 2 (two) times daily as needed for muscle spasms.  Marland Kitchen ezetimibe (ZETIA) 10 MG tablet TAKE 1 TABLET BY MOUTH  DAILY FOR CHOLESTEROL  . fenofibrate micronized (LOFIBRA) 134 MG capsule TAKE 1 CAPSULE BY MOUTH EVERY EVENING.  Marland Kitchen loratadine (CLARITIN) 10 MG tablet Take 10 mg by mouth daily as needed for allergies.  . mometasone (NASONEX) 50 MCG/ACT nasal spray Place 2 sprays into the nose daily as needed (allergies).   . Psyllium ([employer] NATURAL DAILY FIBER PO) Take 1 tablet by mouth daily.  . tamsulosin (FLOMAX) 0.4 MG CAPS capsule Take 1 capsule at bedtime for prostate  . warfarin (COUMADIN) 5 MG tablet TAKE AS DIRECTED BY COUMADIN CLINIC.   No current facility-administered medications on file prior to visit.    Allergies  Allergen Reactions  . Statins     REACTION: muscle aches  . Welchol [Colesevelam Hcl]     unknown   PMHx:   Past Medical History:  Diagnosis Date  . Allergy    SEASONAL  . Cataract    SMALL IN LEFT EYE  . Coronary artery disease    non-obstructive by cath 2009  . Degenerative joint disease   . Heart murmur   . Hyperlipidemia   . Hypertension   . Inguinal hernia    right  . Osteoarthritis   . Peripheral vascular disease (HCC)   . Pre-diabetes   . Pulmonary embolism [facility])    march 2010, 2012              /  DVT x  2  LEFT 2010  . S/P mitral valve repair 08/15/2012   Complex valvuloplasty including triangular resection of posterior leaflet, artificial Gore-tex neocord placement x4 and Sorin Memo 3D ring annuloplasty via right mini thoracotomy approach  . Seasonal allergies   . Severe mitral regurgitation by prior echocardiogram   . Sleep apnea    STOP BANG SCORE 4  . Vitamin D deficiency   . Wears glasses    Immunization History  Administered Date(s) Administered  . Influenza, High Dose Seasonal PF 12/20/2013, 01/14/2015, 01/06/2016, 12/28/2016  . Influenza-Unspecified 01/20/2013  . Pneumococcal Conjugate-13 02/23/2016  . Pneumococcal-Unspecified 05/11/2012  . Td 11/22/2003   Past Surgical History:  Procedure Laterality Date  . achillis tendon     repair 20  years ago  . CARDIAC CATHETERIZATION    . COLONOSCOPY    . COLONOSCOPY W/ BIOPSIES AND POLYPECTOMY    . HIP ARTHROPLASTY     11/06/07  . INGUINAL HERNIA REPAIR Right 11/07/2015   Procedure: LAPAROSCOPIC RIGHT INGUINAL HERNIA WITH MESH;  Surgeon: Axel Filler, MD;  Location: [facility] OR;  Service: General;  Laterality: Right;  . INSERTION OF MESH Right 11/07/2015   Procedure: INSERTION OF MESH;  Surgeon: Axel Filler, MD;  Location: [facility] OR;  Service: General;  Laterality: Right;  . INTRAOPERATIVE TRANSESOPHAGEAL ECHOCARDIOGRAM N/A 08/15/2012   Procedure: INTRAOPERATIVE TRANSESOPHAGEAL ECHOCARDIOGRAM;  Surgeon: Purcell Nails, MD;  Location: [facility] OR;  Service: Open Heart Surgery;  Laterality: N/A;  . MITRAL VALVE REPAIR Right 08/15/2012   Procedure: MINIMALLY INVASIVE MITRAL VALVE REPAIR (MVR);  Surgeon: Purcell Nails, MD;  Location: [facility] OR;  Service: Open Heart Surgery;  Laterality: Right;  . TEE WITHOUT CARDIOVERSION N/A 07/04/2012   Procedure: TRANSESOPHAGEAL ECHOCARDIOGRAM (TEE);  Surgeon: Laurey Morale, MD;  Location: [facility] ENDOSCOPY;  Service: Cardiovascular;  Laterality: N/A;  . TOTAL HIP ARTHROPLASTY  08/24/2011   Procedure: TOTAL HIP ARTHROPLASTY ANTERIOR APPROACH;  Surgeon: Shelda Pal, MD;  Location: [facility] ORS;  Service: Orthopedics;  Laterality: Right;   FHx:    Reviewed / unchanged  SHx:    Reviewed / unchanged   Systems Review:  Constitutional: Denies fever, chills, wt changes, headaches, insomnia, fatigue, night sweats, change in appetite. Eyes: Denies redness, blurred vision, diplopia, discharge, itchy, watery eyes.  ENT: Denies discharge, congestion, post nasal drip, epistaxis, sore throat, earache, hearing loss, dental pain, tinnitus, vertigo, sinus pain, snoring.  CV: Denies chest pain, palpitations, irregular heartbeat, syncope, dyspnea, diaphoresis, orthopnea, PND, claudication or edema. Respiratory: denies cough, dyspnea, DOE, pleurisy, hoarseness, laryngitis, wheezing.    Gastrointestinal: Denies dysphagia, odynophagia, heartburn, reflux, water brash, abdominal pain or cramps, nausea, vomiting, bloating, diarrhea, constipation, hematemesis, melena, hematochezia  or hemorrhoids. Genitourinary: Denies dysuria, frequency, urgency, nocturia, hesitancy, discharge, hematuria or flank pain. Musculoskeletal: Denies arthralgias, myalgias, stiffness, jt. swelling, pain, limping or strain/sprain.  Skin: Denies pruritus, rash, hives, warts, acne, eczema or change in skin lesion(s). Neuro: No weakness, tremor, incoordination, spasms, paresthesia or pain. Psychiatric: Denies confusion, memory loss or sensory loss. Endo: Denies change in weight, skin or hair change.  Heme/Lymph: No excessive bleeding, bruising or enlarged lymph nodes.  Physical Exam  BP 110/66   Pulse 68   Temp (!) 97.5 F (36.4 C)   Resp 18   Ht 6' 0.5" (1.842 m)   Wt 221 lb 6.4 oz (100.4 kg)   BMI 29.61 kg/m   Appears  well nourished, well groomed  and in no distress.  Eyes: PERRLA, EOMs, conjunctiva no  swelling or erythema. Sinuses: No frontal/maxillary tenderness ENT/Mouth: EAC's clear, TM's nl w/o erythema, bulging. Nares clear w/o erythema, swelling, exudates. Oropharynx clear without erythema or exudates. Oral hygiene is good. Tongue normal, non obstructing. Hearing intact.  Neck: Supple. Thyroid not palpable. Car 2+/2+ without bruits, nodes or JVD. Chest: Respirations nl with BS clear & equal w/o rales, rhonchi, wheezing or stridor.  Cor: Heart sounds normal w/ regular rate and rhythm without sig. murmurs, gallops, clicks or rubs. Peripheral pulses normal and equal  without edema.  Abdomen: Soft & bowel sounds normal. Non-tender w/o guarding, rebound, hernias, masses or organomegaly.  Lymphatics: Unremarkable.  Musculoskeletal: Full ROM all peripheral extremities, joint stability, 5/5 strength and normal gait.  Skin: Warm, dry without exposed rashes, lesions or ecchymosis apparent.  Neuro:  Cranial nerves intact, reflexes equal bilaterally. Sensory-motor testing grossly intact. Tendon reflexes grossly intact.  Pysch: Alert & oriented x 3.  Insight and judgement nl & appropriate. No ideations.  Assessment and Plan:  1. Essential hypertension  - Continue medication, monitor blood pressure at home.  - Continue DASH diet. Reminder to go to the ER if any CP,  SOB, nausea, dizziness, severe HA, changes vision/speech.  - CBC with Differential/Platelet - BASIC METABOLIC PANEL WITH GFR - TSH - CBC with Differential/Platelet - BASIC METABOLIC PANEL WITH GFR - Magnesium - TSH  2. Hyperlipidemia, mixed  - Continue diet/meds, exercise,& lifestyle modifications.  - Continue monitor periodic cholesterol/liver & renal functions   - TSH - Hepatic function panel - Lipid panel - TSH  3. Type 2 diabetes mellitus with stage 3 chronic kidney disease, without long-term current use of insulin (HCC)  - Continue diet, exercise, lifestyle modifications.  - Monitor appropriate labs.  - Insulin, random - Hemoglobin A1c - Insulin, random  4. Vitamin D deficiency  - Continue supplementation.  - VITAMIN D 25 Hydroxyl   5. S/P mitral valve repair  6. Medication management  - CBC with Differential/Platelet - BASIC METABOLIC PANEL WITH GFR - Hepatic function panel - Magnesium - Lipid panel - Hemoglobin A1c - TSH - Insulin, random - VITAMIN D 25 Hydroxyl         Discussed  regular exercise, BP monitoring, weight control to achieve/maintain BMI less than 25 and discussed med and SE's. Recommended labs to assess and monitor clinical status with further disposition pending results of labs. Over 30 minutes of exam, counseling, chart review was performed.

## 2017-05-25 LAB — HEMOGLOBIN A1C
Hgb A1c MFr Bld: 6.5 % of total Hgb — ABNORMAL HIGH (ref <5.7)
MEAN PLASMA GLUCOSE: 140 (calc)
eAG (mmol/L): 7.7 (calc)

## 2017-05-25 LAB — CBC WITH DIFFERENTIAL/PLATELET
BASOS ABS: 59 {cells}/uL (ref 0–200)
Basophils Relative: 1.6 %
EOS ABS: 141 {cells}/uL (ref 15–500)
EOS PCT: 3.8 %
HCT: 44.9 % (ref 38.5–50.0)
Hemoglobin: 15.3 g/dL (ref 13.2–17.1)
Lymphs Abs: 1232 cells/uL (ref 850–3900)
MCH: 30.5 pg (ref 27.0–33.0)
MCHC: 34.1 g/dL (ref 32.0–36.0)
MCV: 89.4 fL (ref 80.0–100.0)
MONOS PCT: 9.6 %
MPV: 12.4 fL (ref 7.5–12.5)
Neutro Abs: 1913 cells/uL (ref 1500–7800)
Neutrophils Relative %: 51.7 %
Platelets: 238 10*3/uL (ref 140–400)
RBC: 5.02 10*6/uL (ref 4.20–5.80)
RDW: 13.6 % (ref 11.0–15.0)
Total Lymphocyte: 33.3 %
WBC mixed population: 355 cells/uL (ref 200–950)
WBC: 3.7 10*3/uL — ABNORMAL LOW (ref 3.8–10.8)

## 2017-05-25 LAB — HEPATIC FUNCTION PANEL
AG Ratio: 2.1 (calc) (ref 1.0–2.5)
ALKALINE PHOSPHATASE (APISO): 42 U/L (ref 40–115)
ALT: 18 U/L (ref 9–46)
AST: 29 U/L (ref 10–35)
Albumin: 4.9 g/dL (ref 3.6–5.1)
BILIRUBIN INDIRECT: 0.5 mg/dL (ref 0.2–1.2)
Bilirubin, Direct: 0.2 mg/dL (ref 0.0–0.2)
GLOBULIN: 2.3 g/dL (ref 1.9–3.7)
Total Bilirubin: 0.7 mg/dL (ref 0.2–1.2)
Total Protein: 7.2 g/dL (ref 6.1–8.1)

## 2017-05-25 LAB — MAGNESIUM: MAGNESIUM: 1.9 mg/dL (ref 1.5–2.5)

## 2017-05-25 LAB — INSULIN, RANDOM: INSULIN: 9.4 u[IU]/mL (ref 2.0–19.6)

## 2017-05-25 LAB — BASIC METABOLIC PANEL WITH GFR
BUN/Creatinine Ratio: 13 (calc) (ref 6–22)
BUN: 19 mg/dL (ref 7–25)
CALCIUM: 9.8 mg/dL (ref 8.6–10.3)
CO2: 24 mmol/L (ref 20–32)
Chloride: 104 mmol/L (ref 98–110)
Creat: 1.52 mg/dL — ABNORMAL HIGH (ref 0.70–1.18)
GFR, EST NON AFRICAN AMERICAN: 46 mL/min/{1.73_m2} — AB (ref >=60)
GFR, Est African American: 53 mL/min/{1.73_m2} — ABNORMAL LOW (ref >=60)
Glucose, Bld: 122 mg/dL — ABNORMAL HIGH (ref 65–99)
POTASSIUM: 4.4 mmol/L (ref 3.5–5.3)
Sodium: 137 mmol/L (ref 135–146)

## 2017-05-25 LAB — LIPID PANEL
CHOL/HDL RATIO: 3.1 (calc) (ref <5.0)
CHOLESTEROL: 171 mg/dL (ref <200)
HDL: 56 mg/dL (ref >40)
LDL CHOLESTEROL (CALC): 100 mg/dL — AB
Non-HDL Cholesterol (Calc): 115 mg/dL (calc) (ref <130)
TRIGLYCERIDES: 65 mg/dL (ref <150)

## 2017-05-25 LAB — VITAMIN D 25 HYDROXY (VIT D DEFICIENCY, FRACTURES): VIT D 25 HYDROXY: 50 ng/mL (ref 30–100)

## 2017-05-25 LAB — TSH: TSH: 1.07 m[IU]/L (ref 0.40–4.50)

## 2017-06-13 MED FILL — FENOFIBRATE 134 MG CAPSULE: 134 | 90 days supply | Qty: 90 | Fill #2

## 2017-06-13 MED FILL — AMOXICILLIN 500 MG CAPSULE: 500 | 4 days supply | Qty: 16 | Fill #1

## 2017-07-04 ENCOUNTER — Other Ambulatory Visit: Payer: Self-pay | Admitting: Physician Assistant

## 2017-07-04 DIAGNOSIS — E782 Mixed hyperlipidemia: Secondary | ICD-10-CM

## 2017-07-04 MED FILL — EZETIMIBE 10 MG TABLET: 10 | 90 days supply | Qty: 90 | Fill #0

## 2017-07-11 MED FILL — WARFARIN SODIUM 5 MG TABLET: 5 | 90 days supply | Qty: 150 | Fill #1

## 2017-07-23 ENCOUNTER — Other Ambulatory Visit: Payer: Self-pay

## 2017-07-23 ENCOUNTER — Emergency Department (HOSPITAL_COMMUNITY): Payer: 59

## 2017-07-23 ENCOUNTER — Encounter (HOSPITAL_COMMUNITY): Payer: Self-pay | Admitting: Emergency Medicine

## 2017-07-23 ENCOUNTER — Emergency Department (HOSPITAL_COMMUNITY)
Admission: EM | Admit: 2017-07-23 | Discharge: 2017-07-23 | Disposition: A | Discharge status: Home / Self Care | Payer: 59 | Attending: Emergency Medicine | Admitting: Emergency Medicine

## 2017-07-23 DIAGNOSIS — S0993XA Unspecified injury of face, initial encounter: Secondary | ICD-10-CM | POA: Diagnosis not present

## 2017-07-23 DIAGNOSIS — Z23 Encounter for immunization: Secondary | ICD-10-CM | POA: Diagnosis not present

## 2017-07-23 DIAGNOSIS — Y999 Unspecified external cause status: Secondary | ICD-10-CM | POA: Insufficient documentation

## 2017-07-23 DIAGNOSIS — S02401A Maxillary fracture, unspecified, initial encounter for closed fracture: Secondary | ICD-10-CM | POA: Insufficient documentation

## 2017-07-23 DIAGNOSIS — Y9389 Activity, other specified: Secondary | ICD-10-CM | POA: Diagnosis not present

## 2017-07-23 DIAGNOSIS — S025XXA Fracture of tooth (traumatic), initial encounter for closed fracture: Secondary | ICD-10-CM | POA: Diagnosis not present

## 2017-07-23 DIAGNOSIS — N183 Chronic kidney disease, stage 3 (moderate): Secondary | ICD-10-CM | POA: Insufficient documentation

## 2017-07-23 DIAGNOSIS — S0990XA Unspecified injury of head, initial encounter: Secondary | ICD-10-CM | POA: Diagnosis not present

## 2017-07-23 DIAGNOSIS — Z96641 Presence of right artificial hip joint: Secondary | ICD-10-CM | POA: Diagnosis not present

## 2017-07-23 DIAGNOSIS — W19XXXA Unspecified fall, initial encounter: Secondary | ICD-10-CM

## 2017-07-23 DIAGNOSIS — Z7901 Long term (current) use of anticoagulants: Secondary | ICD-10-CM | POA: Diagnosis not present

## 2017-07-23 DIAGNOSIS — W01198A Fall on same level from slipping, tripping and stumbling with subsequent striking against other object, initial encounter: Secondary | ICD-10-CM | POA: Diagnosis not present

## 2017-07-23 DIAGNOSIS — S01511A Laceration without foreign body of lip, initial encounter: Secondary | ICD-10-CM | POA: Diagnosis not present

## 2017-07-23 DIAGNOSIS — S60512A Abrasion of left hand, initial encounter: Secondary | ICD-10-CM | POA: Diagnosis not present

## 2017-07-23 DIAGNOSIS — F17220 Nicotine dependence, chewing tobacco, uncomplicated: Secondary | ICD-10-CM | POA: Insufficient documentation

## 2017-07-23 DIAGNOSIS — Y9259 Other trade areas as the place of occurrence of the external cause: Secondary | ICD-10-CM | POA: Insufficient documentation

## 2017-07-23 DIAGNOSIS — E1122 Type 2 diabetes mellitus with diabetic chronic kidney disease: Secondary | ICD-10-CM | POA: Diagnosis not present

## 2017-07-23 DIAGNOSIS — S60412A Abrasion of right middle finger, initial encounter: Secondary | ICD-10-CM | POA: Diagnosis not present

## 2017-07-23 DIAGNOSIS — Z79899 Other long term (current) drug therapy: Secondary | ICD-10-CM | POA: Diagnosis not present

## 2017-07-23 DIAGNOSIS — S199XXA Unspecified injury of neck, initial encounter: Secondary | ICD-10-CM | POA: Diagnosis not present

## 2017-07-23 DIAGNOSIS — I129 Hypertensive chronic kidney disease with stage 1 through stage 4 chronic kidney disease, or unspecified chronic kidney disease: Secondary | ICD-10-CM | POA: Insufficient documentation

## 2017-07-23 DIAGNOSIS — I2510 Atherosclerotic heart disease of native coronary artery without angina pectoris: Secondary | ICD-10-CM | POA: Diagnosis not present

## 2017-07-23 DIAGNOSIS — S80212A Abrasion, left knee, initial encounter: Secondary | ICD-10-CM | POA: Insufficient documentation

## 2017-07-23 DIAGNOSIS — S0242XA Fracture of alveolus of maxilla, initial encounter for closed fracture: Secondary | ICD-10-CM | POA: Diagnosis not present

## 2017-07-23 LAB — BASIC METABOLIC PANEL
Anion gap: 14 (ref 5–15)
BUN: 15 mg/dL (ref 6–20)
CO2: 21 mmol/L — AB (ref 22–32)
CREATININE: 1.55 mg/dL — AB (ref 0.61–1.24)
Calcium: 9.2 mg/dL (ref 8.9–10.3)
Chloride: 103 mmol/L (ref 101–111)
GFR calc non Af Amer: 44 mL/min — ABNORMAL LOW (ref >60)
GFR, EST AFRICAN AMERICAN: 51 mL/min — AB (ref >60)
Glucose, Bld: 158 mg/dL — ABNORMAL HIGH (ref 65–99)
POTASSIUM: 4 mmol/L (ref 3.5–5.1)
SODIUM: 138 mmol/L (ref 135–145)

## 2017-07-23 LAB — CBC
HEMATOCRIT: 46.2 % (ref 39.0–52.0)
Hemoglobin: 15.7 g/dL (ref 13.0–17.0)
MCH: 31 pg (ref 26.0–34.0)
MCHC: 34 g/dL (ref 30.0–36.0)
MCV: 91.3 fL (ref 78.0–100.0)
Platelets: 232 10*3/uL (ref 150–400)
RBC: 5.06 MIL/uL (ref 4.22–5.81)
RDW: 13.3 % (ref 11.5–15.5)
WBC: 5.2 10*3/uL (ref 4.0–10.5)

## 2017-07-23 LAB — PROTIME-INR
INR: 2.39
Prothrombin Time: 25.9 seconds — ABNORMAL HIGH (ref 11.4–15.2)

## 2017-07-23 MED ORDER — LIDOCAINE-EPINEPHRINE (PF) 2 %-1:200000 IJ SOLN
20.0000 mL | Freq: Once | INTRAMUSCULAR | Status: AC
  Administered 2017-07-23: 20 mL
  Filled 2017-07-23: qty 20

## 2017-07-23 MED ORDER — CHLORHEXIDINE GLUCONATE 0.12 % MT SOLN: 15.0000 mL | Freq: Two times a day (BID) | OROMUCOSAL | 0 refills | Status: DC

## 2017-07-23 MED ORDER — AMOXICILLIN 500 MG PO CAPS
500.0000 mg | ORAL_CAPSULE | Freq: Once | ORAL | Status: AC
  Administered 2017-07-23: 500 mg via ORAL
  Filled 2017-07-23: qty 1

## 2017-07-23 MED ORDER — AMOXICILLIN 500 MG PO CAPS: 500.0000 mg | ORAL_CAPSULE | Freq: Three times a day (TID) | ORAL | 0 refills | Status: DC

## 2017-07-23 MED ORDER — TETANUS-DIPHTH-ACELL PERTUSSIS 5-2.5-18.5 LF-MCG/0.5 IM SUSP
0.5000 mL | Freq: Once | INTRAMUSCULAR | Status: AC
  Administered 2017-07-23: 0.5 mL via INTRAMUSCULAR
  Filled 2017-07-23: qty 0.5

## 2017-07-23 NOTE — ED Provider Notes (Signed)
[facility] EMERGENCY DEPARTMENT Provider Note   CSN: [id number] Arrival date & time: 07/23/17  4098     History   Chief Complaint Chief Complaint  Patient presents with  . Fall  . Lip Laceration    HPI [name] is a 72 y.o. male.  He presents after sustaining a fall this morning.  He was at the [employer] carrying an awkward load of flowers when he felt like he could not stop himself from falling forward.  He ended up striking his face on the ground.  There was no loss of consciousness.  He is on Coumadin.  He is complaining of facial pain dental pain and pain in his right hand left hand and above his left knee.  He has various abrasions on his hands and above his left knee.  He denies headache chest pain abdominal pain numbness or weakness.  He was ambulatory after the fall.  The history is provided by the patient.  Fall  This is a new problem. The problem has not changed since onset.Pertinent negatives include no chest pain, no abdominal pain, no headaches and no shortness of breath. Nothing aggravates the symptoms. Nothing relieves the symptoms. He has tried nothing for the symptoms. The treatment provided no relief.    Past Medical History:  Diagnosis Date  . Allergy    SEASONAL  . Cataract    SMALL IN LEFT EYE  . Coronary artery disease    non-obstructive by cath 2009  . Degenerative joint disease   . Heart murmur   . Hyperlipidemia   . Hypertension   . Inguinal hernia    right  . Osteoarthritis   . Peripheral vascular disease (HCC)   . Pre-diabetes   . Pulmonary embolism [facility])    march 2010, 2012              /   DVT x  2  LEFT 2010  . S/P mitral valve repair 08/15/2012   Complex valvuloplasty including triangular resection of posterior leaflet, artificial Gore-tex neocord placement x4 and Sorin Memo 3D ring annuloplasty via right mini thoracotomy approach  . Seasonal allergies   . Severe mitral regurgitation by prior echocardiogram   .  Sleep apnea    STOP BANG SCORE 4  . Vitamin D deficiency   . Wears glasses     Patient Active Problem List   Diagnosis Date Noted  . Encounter for Medicare annual wellness exam 02/23/2016  . Hx of colonic polyps 05/31/2014  . Obesity 04/24/2014  . Medication management 05/24/2013  . DM type 2 causing CKD stage 3 (HCC) 05/24/2013  . Hyperlipidemia   . Hypertension   . Seasonal allergies   . Osteoarthritis   . Vitamin D deficiency   . S/P mitral valve repair 08/15/2012  . Right carotid bruit 06/14/2011  . Long term current use of anticoagulant therapy 11/05/2010    Past Surgical History:  Procedure Laterality Date  . achillis tendon     repair 20 years ago  . CARDIAC CATHETERIZATION    . COLONOSCOPY    . COLONOSCOPY W/ BIOPSIES AND POLYPECTOMY    . HIP ARTHROPLASTY     11/06/07  . INGUINAL HERNIA REPAIR Right 11/07/2015   Procedure: LAPAROSCOPIC RIGHT INGUINAL HERNIA WITH MESH;  Surgeon: Axel Filler, MD;  Location: [facility] OR;  Service: General;  Laterality: Right;  . INSERTION OF MESH Right 11/07/2015   Procedure: INSERTION OF MESH;  Surgeon: Axel Filler, MD;  Location:  [facility] OR;  Service: General;  Laterality: Right;  . INTRAOPERATIVE TRANSESOPHAGEAL ECHOCARDIOGRAM N/A 08/15/2012   Procedure: INTRAOPERATIVE TRANSESOPHAGEAL ECHOCARDIOGRAM;  Surgeon: Purcell Nails, MD;  Location: [facility] OR;  Service: Open Heart Surgery;  Laterality: N/A;  . MITRAL VALVE REPAIR Right 08/15/2012   Procedure: MINIMALLY INVASIVE MITRAL VALVE REPAIR (MVR);  Surgeon: Purcell Nails, MD;  Location: [facility] OR;  Service: Open Heart Surgery;  Laterality: Right;  . TEE WITHOUT CARDIOVERSION N/A 07/04/2012   Procedure: TRANSESOPHAGEAL ECHOCARDIOGRAM (TEE);  Surgeon: Laurey Morale, MD;  Location: [facility] ENDOSCOPY;  Service: Cardiovascular;  Laterality: N/A;  . TOTAL HIP ARTHROPLASTY  08/24/2011   Procedure: TOTAL HIP ARTHROPLASTY ANTERIOR APPROACH;  Surgeon: Shelda Pal, MD;  Location: [facility] ORS;  Service: Orthopedics;   Laterality: Right;        Home Medications    Prior to Admission medications   Medication Sig Start Date End Date Taking? Authorizing Provider  acetaminophen (TYLENOL) 500 MG tablet Take 500 mg by mouth every 6 (six) hours as needed for pain.    [provider]  benazepril (LOTENSIN) 20 MG tablet TAKE 1 TABLET BY MOUTH ONCE DAILY 09/27/16   Lucky Cowboy, MD  Cholecalciferol (VITAMIN D PO) Take by mouth daily. TAKING 5,000 UNITS DAILY    [provider]  CINNAMON PO Takes 2 tablets (2000mg  ) daily    [provider]  cyclobenzaprine (FLEXERIL) 10 MG tablet Take 1 tablet (10 mg total) by mouth 2 (two) times daily as needed for muscle spasms. 10/23/16   Alene Mires, NP  ezetimibe (ZETIA) 10 MG tablet TAKE 1 TABLET BY MOUTH DAILY FOR CHOLESTEROL 07/04/17   Judd Gaudier, NP  fenofibrate micronized (LOFIBRA) 134 MG capsule TAKE 1 CAPSULE BY MOUTH EVERY EVENING. 12/06/16   Lucky Cowboy, MD  loratadine (CLARITIN) 10 MG tablet Take 10 mg by mouth daily as needed for allergies.    [provider]  mometasone (NASONEX) 50 MCG/ACT nasal spray Place 2 sprays into the nose daily as needed (allergies).     [provider]  Psyllium ([employer] NATURAL DAILY FIBER PO) Take 1 tablet by mouth daily.    [provider]  tamsulosin (FLOMAX) 0.4 MG CAPS capsule Take 1 capsule at bedtime for prostate 03/25/17   Lucky Cowboy, MD  warfarin (COUMADIN) 5 MG tablet TAKE AS DIRECTED BY COUMADIN CLINIC. 03/28/17   Kathleene Hazel, MD    Family History Family History  Problem Relation Age of Onset  . Heart attack Father   . Hypertension Father   . Coronary artery disease Unknown   . Heart attack Brother 60  . Hypertension Sister   . Hypertension Brother   . Hyperlipidemia Sister   . Diabetes Sister   . Diabetes Brother     Social History Social History   Tobacco Use  . Smoking status: Former Smoker    Types: Cigars    Last attempt to  quit: 03/22/1973    Years since quitting: 44.3  . Smokeless tobacco: Current User    Types: Chew  . Tobacco comment: uses smokeless tobacco  Substance Use Topics  . Alcohol use: No    Alcohol/week: 0.0 oz  . Drug use: No     Allergies   Statins and Welchol [colesevelam hcl]   Review of Systems Review of Systems  Constitutional: Negative for chills and fever.  HENT: Positive for dental problem. Negative for sore throat.   Eyes: Negative for pain and visual disturbance.  Respiratory: Negative  for cough and shortness of breath.   Cardiovascular: Negative for chest pain and palpitations.  Gastrointestinal: Negative for abdominal pain, nausea and vomiting.  Genitourinary: Negative for flank pain and hematuria.  Musculoskeletal: Negative for back pain and neck pain.  Skin: Positive for wound. Negative for rash.  Neurological: Negative for seizures, syncope and headaches.     Physical Exam Updated Vital Signs BP (!) 132/108   Pulse 86   Temp 98.3 F (36.8 C)   Resp 19   SpO2 97%   Physical Exam  Constitutional: He appears well-developed and well-nourished.  HENT:  Head: Normocephalic.  Right Ear: External ear normal.  Left Ear: External ear normal.  Nose: Nose normal.  Mouth/Throat: Oropharynx is clear and moist.  Patient has abrasion to his upper lip and philtrum with laceration through the vermilion border.  He also has dental fractures of his upper incisors and some ecchymosis over the maxilla.  Trachea is midline, C-spine immobilized in hard collar presently.  Eyes: Pupils are equal, round, and reactive to light. Conjunctivae and EOM are normal.  Cardiovascular: Normal rate and regular rhythm.  No murmur heard. Pulmonary/Chest: Effort normal and breath sounds normal. No respiratory distress.  Abdominal: Soft. He exhibits no mass. There is no tenderness.  Musculoskeletal: Normal range of motion. He exhibits no edema.  He has abrasions in his left palm and right second  digit.  He also has an abrasion above his left knee but full range of motion of all his joints no bony tenderness.  Lymphadenopathy:    He has no cervical adenopathy.  Neurological: He is alert.  Skin: Skin is warm and dry. Capillary refill takes less than 2 seconds.  Psychiatric: He has a normal mood and affect.  Nursing note and vitals reviewed.    ED Treatments / Results  Labs (all labs ordered are listed, but only abnormal results are displayed) Labs Reviewed  PROTIME-INR - Abnormal; Notable for the following components:      Result Value   Prothrombin Time 25.9 (*)    All other components within normal limits  BASIC METABOLIC PANEL - Abnormal; Notable for the following components:   CO2 21 (*)    Glucose, Bld 158 (*)    Creatinine, Ser 1.55 (*)    GFR calc non Af Amer 44 (*)    GFR calc Af Amer 51 (*)    All other components within normal limits  CBC    EKG None  Radiology Ct Head Wo Contrast  Result Date: 07/23/2017 CLINICAL DATA:  73 year old male with history of trauma from a fall with laceration to the lip. EXAM: CT HEAD WITHOUT CONTRAST CT MAXILLOFACIAL WITHOUT CONTRAST CT CERVICAL SPINE WITHOUT CONTRAST TECHNIQUE: Multidetector CT imaging of the head, cervical spine, and maxillofacial structures were performed using the standard protocol without intravenous contrast. Multiplanar CT image reconstructions of the cervical spine and maxillofacial structures were also generated. COMPARISON:  Head CT and cervical spine CT scan 03/24/2016. FINDINGS: CT HEAD FINDINGS Brain: No evidence of acute infarction, hemorrhage, hydrocephalus, extra-axial collection or mass lesion/mass effect. Vascular: No hyperdense vessel or unexpected calcification. Skull: Normal. Negative for fracture or focal lesion. Other: None. CT MAXILLOFACIAL FINDINGS Osseous: Minimally displaced fracture through the alveolar ridge of the maxilla adjacent to the root of tooth number 9. Mandibular condyles are  located bilaterally. Mandible is intact. Pterygoid plates are intact. Orbits: Negative. No traumatic or inflammatory finding. Sinuses: Small mucosal retention cyst or polyp in the right maxillary sinus inferiorly incidentally  noted. Clear. Soft tissues: Small amount of soft tissue swelling overlying the fracture of the anterior aspect of the maxilla. CT CERVICAL SPINE FINDINGS Alignment: Straightening of normal cervical lordosis. Alignment is otherwise anatomic. Skull base and vertebrae: No acute fracture. No primary bone lesion or focal pathologic process. Soft tissues and spinal canal: No prevertebral fluid or swelling. No visible canal hematoma. Disc levels: Multilevel degenerative disc disease, most pronounced at C4-C5, C5-C6, C6-C7 and C7-T1. Multilevel facet arthropathy. Upper chest: Unremarkable. Other: None. IMPRESSION: 1. Acute minimally displaced fracture through the alveolar ridge of the maxilla overlying the root of tooth 9. Overlying soft tissue swelling. 2. No evidence of significant acute traumatic injury to the skull, brain or cervical spine. 3. Multilevel degenerative disc disease and cervical spondylosis, as above. Electronically Signed   By: Trudie Reed M.D.   On: 07/23/2017 10:00   Ct Cervical Spine Wo Contrast  Result Date: 07/23/2017 CLINICAL DATA:  72 year old male with history of trauma from a fall with laceration to the lip. EXAM: CT HEAD WITHOUT CONTRAST CT MAXILLOFACIAL WITHOUT CONTRAST CT CERVICAL SPINE WITHOUT CONTRAST TECHNIQUE: Multidetector CT imaging of the head, cervical spine, and maxillofacial structures were performed using the standard protocol without intravenous contrast. Multiplanar CT image reconstructions of the cervical spine and maxillofacial structures were also generated. COMPARISON:  Head CT and cervical spine CT scan 03/24/2016. FINDINGS: CT HEAD FINDINGS Brain: No evidence of acute infarction, hemorrhage, hydrocephalus, extra-axial collection or mass  lesion/mass effect. Vascular: No hyperdense vessel or unexpected calcification. Skull: Normal. Negative for fracture or focal lesion. Other: None. CT MAXILLOFACIAL FINDINGS Osseous: Minimally displaced fracture through the alveolar ridge of the maxilla adjacent to the root of tooth number 9. Mandibular condyles are located bilaterally. Mandible is intact. Pterygoid plates are intact. Orbits: Negative. No traumatic or inflammatory finding. Sinuses: Small mucosal retention cyst or polyp in the right maxillary sinus inferiorly incidentally noted. Clear. Soft tissues: Small amount of soft tissue swelling overlying the fracture of the anterior aspect of the maxilla. CT CERVICAL SPINE FINDINGS Alignment: Straightening of normal cervical lordosis. Alignment is otherwise anatomic. Skull base and vertebrae: No acute fracture. No primary bone lesion or focal pathologic process. Soft tissues and spinal canal: No prevertebral fluid or swelling. No visible canal hematoma. Disc levels: Multilevel degenerative disc disease, most pronounced at C4-C5, C5-C6, C6-C7 and C7-T1. Multilevel facet arthropathy. Upper chest: Unremarkable. Other: None. IMPRESSION: 1. Acute minimally displaced fracture through the alveolar ridge of the maxilla overlying the root of tooth 9. Overlying soft tissue swelling. 2. No evidence of significant acute traumatic injury to the skull, brain or cervical spine. 3. Multilevel degenerative disc disease and cervical spondylosis, as above. Electronically Signed   By: Trudie Reed M.D.   On: 07/23/2017 10:00   Ct Maxillofacial Wo Contrast  Result Date: 07/23/2017 CLINICAL DATA:  72 year old male with history of trauma from a fall with laceration to the lip. EXAM: CT HEAD WITHOUT CONTRAST CT MAXILLOFACIAL WITHOUT CONTRAST CT CERVICAL SPINE WITHOUT CONTRAST TECHNIQUE: Multidetector CT imaging of the head, cervical spine, and maxillofacial structures were performed using the standard protocol without  intravenous contrast. Multiplanar CT image reconstructions of the cervical spine and maxillofacial structures were also generated. COMPARISON:  Head CT and cervical spine CT scan 03/24/2016. FINDINGS: CT HEAD FINDINGS Brain: No evidence of acute infarction, hemorrhage, hydrocephalus, extra-axial collection or mass lesion/mass effect. Vascular: No hyperdense vessel or unexpected calcification. Skull: Normal. Negative for fracture or focal lesion. Other: None. CT MAXILLOFACIAL FINDINGS Osseous: Minimally displaced  fracture through the alveolar ridge of the maxilla adjacent to the root of tooth number 9. Mandibular condyles are located bilaterally. Mandible is intact. Pterygoid plates are intact. Orbits: Negative. No traumatic or inflammatory finding. Sinuses: Small mucosal retention cyst or polyp in the right maxillary sinus inferiorly incidentally noted. Clear. Soft tissues: Small amount of soft tissue swelling overlying the fracture of the anterior aspect of the maxilla. CT CERVICAL SPINE FINDINGS Alignment: Straightening of normal cervical lordosis. Alignment is otherwise anatomic. Skull base and vertebrae: No acute fracture. No primary bone lesion or focal pathologic process. Soft tissues and spinal canal: No prevertebral fluid or swelling. No visible canal hematoma. Disc levels: Multilevel degenerative disc disease, most pronounced at C4-C5, C5-C6, C6-C7 and C7-T1. Multilevel facet arthropathy. Upper chest: Unremarkable. Other: None. IMPRESSION: 1. Acute minimally displaced fracture through the alveolar ridge of the maxilla overlying the root of tooth 9. Overlying soft tissue swelling. 2. No evidence of significant acute traumatic injury to the skull, brain or cervical spine. 3. Multilevel degenerative disc disease and cervical spondylosis, as above. Electronically Signed   By: Trudie Reed M.D.   On: 07/23/2017 10:00    Procedures .Marland KitchenLaceration Repair Date/Time: 07/23/2017 11:17 AM Performed by: Terrilee Files, MD Authorized by: Terrilee Files, MD   Consent:    Consent obtained:  Verbal   Consent given by:  Patient   Risks discussed:  Infection, pain, poor cosmetic result, poor wound healing, retained foreign body and need for additional repair   Alternatives discussed:  No treatment and delayed treatment Anesthesia (see MAR for exact dosages):    Anesthesia method:  Nerve block   Block needle gauge:  25 G   Block anesthetic:  Lidocaine 2% WITH epi   Block technique:  Infraorbital   Block injection procedure:  Anatomic landmarks identified, introduced needle, incremental injection, negative aspiration for blood and anatomic landmarks palpated   Block outcome:  Anesthesia achieved Laceration details:    Location:  Lip   Lip location:  Upper exterior lip   Length (cm):  2 Repair type:    Repair type:  Simple Pre-procedure details:    Preparation:  Patient was prepped and draped in usual sterile fashion and imaging obtained to evaluate for foreign bodies Treatment:    Area cleansed with:  Saline Skin repair:    Repair method:  Sutures   Suture size:  5-0   Suture material:  Prolene   Number of sutures:  2 Approximation:    Approximation:  Close Comments:     Patient had laceration of lip through vermilion border.  There is definitely some missing tissue but I did the best I could as far as reestablishing that anatomic landmark.  Per recommendations from Dr. Kenney Houseman just placed 2 stitches to anchor that.   (including critical care time)  Medications Ordered in ED Medications  Tdap (BOOSTRIX) injection 0.5 mL (has no administration in time range)  lidocaine-EPINEPHrine (XYLOCAINE W/EPI) 2 %-1:200000 (PF) injection 20 mL (has no administration in time range)     Initial Impression / Assessment and Plan / ED Course  I have reviewed the triage vital signs and the nursing notes.  Pertinent labs & imaging results that were available during my care of the patient were reviewed  by me and considered in my medical decision making (see chart for details).  Clinical Course as of Jul 24 1115  Sat Jul 23, 2017  1045 Discussed with Dr. Kenney Houseman trauma ENT who will evaluate the images  and picture and call back with recommendations.   [MB]  1052 Dr. Kenney Houseman recommends reapproximated the rolling border but otherwise he does not feel that the area would improve with any further closure.  He is also recommending oral antibiotic such as penicillin amoxicillin and Peridex rinses and follow-up with dentist.   [MB]    Clinical Course User Index [MB] Terrilee Files, MD        Final Clinical Impressions(s) / ED Diagnoses   Final diagnoses:  Fall, initial encounter  Laceration of vermilion border of upper lip, initial encounter  Closed fracture of maxilla, unspecified laterality, initial encounter [facility])    ED Discharge Orders        Ordered    amoxicillin (AMOXIL) 500 MG capsule  3 times daily     07/23/17 1124    chlorhexidine (PERIDEX) 0.12 % solution  2 times daily     07/23/17 1124       Terrilee Files, MD 07/25/17 [phone]

## 2017-07-23 NOTE — ED Triage Notes (Addendum)
[place] [employer] [place] and he felt like his balance was off and fell forwards. "unsure" if he was dizzy or has any dizziness at present. Sates "I don't think so." Pt has laceration to the lip with scant amount of active bleeding. Pt also c.o. Neck pain. Small abrasion note to right finger. Pt is on coumadin.

## 2017-07-23 NOTE — ED Notes (Signed)
Dr. Lynnette Caffey called for consult per Dr. Shela Commons request

## 2017-07-23 NOTE — ED Notes (Signed)
Walked patient to the bathroom patient did well patient is now back in bed call bell in reach and family at bedside

## 2017-07-23 NOTE — ED Notes (Signed)
C-Collar applied at triage.

## 2017-07-23 NOTE — Discharge Instructions (Signed)
Your evaluated in the emergency department for a fall in which she sustained a laceration of your upper lip and a fracture in that area.  You also broke some teeth.  It is important that you follow-up with your dentist as soon as possible.  The laceration we had to reapproximate the edge of your lip and this may require further repair if cosmetically it does not satisfy you.  You should keep the area clean with soap and water and apply bacitracin to the area.  We are also prescribing you an antibiotic and you should use a dental rinse.  Soft diet until you see your dentist.  Return if any worsening symptoms.

## 2017-07-23 NOTE — ED Notes (Signed)
Got patient on the onitor patient is resting with family at bedside and call bell in reach

## 2017-07-25 NOTE — Progress Notes (Signed)
Assessment and Plan:  [name] was seen today for follow-up.  Diagnoses and all orders for this visit:  Fall, subsequent encounter Neuro exam unremarkable; discussed with patient, has been experiencing gradual decline in balance- will refer to PT to work on learning exercises for strength/balance and flexibility.   Laceration of vermilion border of upper lip, subsequent encounter Healing well; follow up in 2 days for suture removal  Closed fracture of maxilla with routine healing, unspecified laterality, subsequent encounter Follow up with dentist as scheduled for root canal/cap   Further disposition pending results of labs. Discussed med's effects and SE's.   Over 15 minutes of exam, counseling, chart review, and critical decision making was performed.   Future Appointments  Date Time Provider Department Center  07/27/2017  9:45 AM [facility]  07/28/2017  9:15 AM Judd Gaudier, NP [facility] None  08/16/2017  9:00 AM Laurann Montana, PA-C [facility]  09/19/2017  8:45 AM Judd Gaudier, NP [facility] None  12/23/2017  9:30 AM Lucky Cowboy, MD [facility] None    ------------------------------------------------------------------------------------------------------------------   HPI BP 112/72   Pulse (!) 52   Temp (!) 97.5 F (36.4 C)   Ht 6' 0.5" (1.842 m)   Wt 221 lb (100.2 kg)   SpO2 97%   BMI 29.56 kg/m   71 y.o.male with hx of CAD, murmur s/p mitral valve repair, PVD, PE (on coumadin) presents for evaluation after he reportedly [place]2017 - was reportedly carrying an awkward load of flowers and was unable to stop himself and struck his face on the ground and presented to the ED; he was found to have laceration of vermillion border of upper lip, CT of head/cervical/maxillofacial demonstrated: 1. Acute minimally displaced fracture through the alveolar ridge of the maxilla overlying the root  of tooth 9. Overlying soft tissue swelling.2. No evidence of significant acute traumatic injury to the skull, brain or cervical spine.  Trauma ENT was consulted and 2 stiches were placed, instructions given for follow up with dentist and PCP, and discharged with prescription for amoxicillin 500 mg TID and peridex. He has follow up with dentist scheduled for 5/20 th for chipped tooth and fracture - plans to do a root canal with eventual cap.   He reports this is his first fall in a long time, but does endorse general sense of imbalance, and occasionally feels as though he may "tip forward" - he reports a few near falls, always falling forward, but has been able to catch himself. Denies headaches, changes in vision, numbness/tingling, acute onset of any symptoms, dizziness or weakness.   His coumadin is managed by [facility] and has follow up tomorrow.   Past Medical History:  Diagnosis Date  . Allergy    SEASONAL  . Cataract    SMALL IN LEFT EYE  . Coronary artery disease    non-obstructive by cath 2009  . Degenerative joint disease   . Heart murmur   . Hyperlipidemia   . Hypertension   . Inguinal hernia    right  . Osteoarthritis   . Peripheral vascular disease (HCC)   . Pre-diabetes   . Pulmonary embolism [facility])    march 2010, 2012              /   DVT x  2  LEFT 2010  . S/P mitral valve repair 08/15/2012   Complex valvuloplasty including triangular resection of posterior leaflet, artificial Gore-tex neocord placement  x4 and Sorin Memo 3D ring annuloplasty via right mini thoracotomy approach  . Seasonal allergies   . Severe mitral regurgitation by prior echocardiogram   . Sleep apnea    STOP BANG SCORE 4  . Vitamin D deficiency   . Wears glasses      Allergies  Allergen Reactions  . Statins     REACTION: muscle aches  . Welchol [Colesevelam Hcl]     unknown    Current Outpatient Medications on File Prior to Visit  Medication Sig  . acetaminophen (TYLENOL)  500 MG tablet Take 500 mg by mouth every 6 (six) hours as needed for pain.  Marland Kitchen amoxicillin (AMOXIL) 500 MG capsule Take 1 capsule (500 mg total) by mouth 3 (three) times daily.  . benazepril (LOTENSIN) 20 MG tablet TAKE 1 TABLET BY MOUTH ONCE DAILY  . chlorhexidine (PERIDEX) 0.12 % solution Use as directed 15 mLs in the mouth or throat 2 (two) times daily.  . Cholecalciferol (VITAMIN D PO) Take by mouth daily. TAKING 5,000 UNITS DAILY  . CINNAMON PO Takes 2 tablets (2000mg  ) daily  . cyclobenzaprine (FLEXERIL) 10 MG tablet Take 1 tablet (10 mg total) by mouth 2 (two) times daily as needed for muscle spasms.  Marland Kitchen ezetimibe (ZETIA) 10 MG tablet TAKE 1 TABLET BY MOUTH DAILY FOR CHOLESTEROL  . fenofibrate micronized (LOFIBRA) 134 MG capsule TAKE 1 CAPSULE BY MOUTH EVERY EVENING.  Marland Kitchen loratadine (CLARITIN) 10 MG tablet Take 10 mg by mouth daily as needed for allergies.  . mometasone (NASONEX) 50 MCG/ACT nasal spray Place 2 sprays into the nose daily as needed (allergies).   . tamsulosin (FLOMAX) 0.4 MG CAPS capsule Take 1 capsule at bedtime for prostate  . warfarin (COUMADIN) 5 MG tablet TAKE AS DIRECTED BY COUMADIN CLINIC.  Marland Kitchen Psyllium ([employer] NATURAL DAILY FIBER PO) Take 1 tablet by mouth daily.   No current facility-administered medications on file prior to visit.     ROS: all negative except above.   Physical Exam:  BP 112/72   Pulse (!) 52   Temp (!) 97.5 F (36.4 C)   Ht 6' 0.5" (1.842 m)   Wt 221 lb (100.2 kg)   SpO2 97%   BMI 29.56 kg/m   General Appearance: Well nourished, in no apparent distress. Eyes: PERRLA, EOMs, conjunctiva no swelling or erythema ENT/Mouth: Ext aud canals clear, TMs without erythema, bulging. No erythema, swelling, or exudate on post pharynx.  Tonsils not swollen or erythematous. Hearing normal. No wounds to inside of mouth, front left central incisor chipped.  Neck: Supple, thyroid normal.  Respiratory: Respiratory effort normal, BS equal bilaterally without  rales, rhonchi, wheezing or stridor.  Cardio: Irregularly irregular with no MRGs. Brisk peripheral pulses without edema.  Abdomen: Soft, + BS.  Non tender, no guarding, rebound, hernias, masses. Lymphatics: Non tender without lymphadenopathy.  Musculoskeletal: Symmetrical strength 5/5, slow gait.  Skin: Warm, dry; wound to upper lip healing well; 2 sutures present, edges remain approximated   Neuro: Cranial nerves intact. Normal muscle tone, no cerebellar symptoms. Sensation intact.  Psych: Awake and oriented X 3, normal affect, Insight and Judgment appropriate.    Dan Maker, NP 12:12 PM [facility]

## 2017-07-26 ENCOUNTER — Ambulatory Visit (INDEPENDENT_AMBULATORY_CARE_PROVIDER_SITE_OTHER): Payer: 59 | Admitting: Adult Health

## 2017-07-26 ENCOUNTER — Encounter: Payer: Self-pay | Admitting: Adult Health

## 2017-07-26 VITALS — BP 112/72 | HR 52 | Temp 97.5°F | Ht 72.5 in | Wt 221.0 lb

## 2017-07-26 DIAGNOSIS — R2689 Other abnormalities of gait and mobility: Secondary | ICD-10-CM

## 2017-07-26 DIAGNOSIS — S02401D Maxillary fracture, unspecified, subsequent encounter for fracture with routine healing: Secondary | ICD-10-CM

## 2017-07-26 DIAGNOSIS — S01511D Laceration without foreign body of lip, subsequent encounter: Secondary | ICD-10-CM

## 2017-07-26 DIAGNOSIS — W19XXXD Unspecified fall, subsequent encounter: Secondary | ICD-10-CM

## 2017-07-27 ENCOUNTER — Ambulatory Visit (INDEPENDENT_AMBULATORY_CARE_PROVIDER_SITE_OTHER): Payer: 59 | Admitting: *Deleted

## 2017-07-27 DIAGNOSIS — Z9889 Other specified postprocedural states: Secondary | ICD-10-CM | POA: Diagnosis not present

## 2017-07-27 DIAGNOSIS — Z7901 Long term (current) use of anticoagulants: Secondary | ICD-10-CM

## 2017-07-27 DIAGNOSIS — Z5181 Encounter for therapeutic drug level monitoring: Secondary | ICD-10-CM

## 2017-07-27 DIAGNOSIS — I2699 Other pulmonary embolism without acute cor pulmonale: Secondary | ICD-10-CM | POA: Diagnosis not present

## 2017-07-27 LAB — POCT INR: INR: 1.8

## 2017-07-27 NOTE — Progress Notes (Signed)
Subjective:    [name] is a 72 y.o. male who obtained a laceration 6 days ago, which required closure with 2 sutures. Mechanism of injury: fell and wasn't able to catch himself due to awkward load. He denies pain, redness, or drainage from the wound. His last tetanus was 6 days ago.  The following portions of the patient's history were reviewed and updated as appropriate: allergies, current medications, past family history, past medical history, past social history, past surgical history and problem list.  Review of Systems A comprehensive review of systems was negative.    Objective:    There were no vitals taken for this visit. Injury exam:  A 1 cm laceration noted on the upper mid vermillion border is healing well, without evidence of infection.    Assessment:    Laceration is healing well, without evidence of infection.    Plan:     1. 2 sutures were removed. 2. Wound care discussed. 3. Follow up as needed.

## 2017-07-27 NOTE — Patient Instructions (Signed)
Description   Today May 8th take 2 and 1/2 tablets (12.5mg )  Then continue taking  same dosage 1 tablet daily except 2 tablets on Mondays, Wednesdays and Fridays.  Recheck in 2weeks. Please call with any medication changes or if scheduled for any procedures [facility]# [phone]. Main # [phone].

## 2017-07-28 ENCOUNTER — Ambulatory Visit (INDEPENDENT_AMBULATORY_CARE_PROVIDER_SITE_OTHER): Payer: 59 | Admitting: Adult Health

## 2017-07-28 ENCOUNTER — Encounter: Payer: Self-pay | Admitting: Adult Health

## 2017-07-28 VITALS — BP 110/70 | HR 60 | Temp 97.7°F | Ht 72.5 in | Wt 222.8 lb

## 2017-07-28 DIAGNOSIS — Z4802 Encounter for removal of sutures: Secondary | ICD-10-CM | POA: Diagnosis not present

## 2017-07-28 DIAGNOSIS — S01511D Laceration without foreign body of lip, subsequent encounter: Secondary | ICD-10-CM

## 2017-07-28 NOTE — Patient Instructions (Signed)
Suture Removal, Care After Refer to this sheet in the next few weeks. These instructions provide you with information on caring for yourself after your procedure. Your health care provider may also give you more specific instructions. Your treatment has been planned according to current medical practices, but problems sometimes occur. Call your health care provider if you have any problems or questions after your procedure. What can I expect after the procedure? After your stitches (sutures) are removed, it is typical to have the following:  Some discomfort and swelling in the wound area.  Slight redness in the area.  Follow these instructions at home:  If you have skin adhesive strips over the wound area, do not take the strips off. They will fall off on their own in a few days. If the strips remain in place after 14 days, you may remove them.  Change any bandages (dressings) at least once a day or as directed by your health care provider. If the bandage sticks, soak it off with warm, soapy water.  Apply cream or ointment only as directed by your health care provider. If using cream or ointment, wash the area with soap and water 2 times a day to remove all the cream or ointment. Rinse off the soap and pat the area dry with a clean towel.  Keep the wound area dry and clean. If the bandage becomes wet or dirty, or if it develops a bad smell, change it as soon as possible.  Continue to protect the wound from injury.  Use sunscreen when out in the sun. New scars become sunburned easily. Contact a health care provider if:  You have increasing redness, swelling, or pain in the wound.  You see pus coming from the wound.  You have a fever.  You notice a bad smell coming from the wound or dressing.  Your wound breaks open (edges not staying together). This information is not intended to replace advice given to you by your health care provider. Make sure you discuss any questions you have  with your health care provider. Document Released: 12/01/2000 Document Revised: 08/14/2015 Document Reviewed: 10/18/2012 Elsevier Interactive Patient Education  2017 Elsevier Inc.  

## 2017-08-10 ENCOUNTER — Ambulatory Visit (INDEPENDENT_AMBULATORY_CARE_PROVIDER_SITE_OTHER): Payer: 59 | Admitting: *Deleted

## 2017-08-10 DIAGNOSIS — Z9889 Other specified postprocedural states: Secondary | ICD-10-CM | POA: Diagnosis not present

## 2017-08-10 DIAGNOSIS — Z7901 Long term (current) use of anticoagulants: Secondary | ICD-10-CM | POA: Diagnosis not present

## 2017-08-10 DIAGNOSIS — Z5181 Encounter for therapeutic drug level monitoring: Secondary | ICD-10-CM | POA: Diagnosis not present

## 2017-08-10 DIAGNOSIS — I2699 Other pulmonary embolism without acute cor pulmonale: Secondary | ICD-10-CM | POA: Diagnosis not present

## 2017-08-10 LAB — POCT INR: INR: 2.8 (ref 2.0–3.0)

## 2017-08-10 NOTE — Patient Instructions (Signed)
Description   Continue taking  same dosage 1 tablet daily except 2 tablets on Mondays, Wednesdays and Fridays.  Recheck in 3 weeks. Please call with any medication changes or if scheduled for any procedures [facility]# [phone]. Main # [phone].     

## 2017-08-12 ENCOUNTER — Encounter: Payer: Self-pay | Admitting: Physician Assistant

## 2017-08-12 NOTE — Progress Notes (Signed)
Cardiology Office Note    Date:  08/16/2017  ID:  [name], DOB Jan 24, 1946, MRN [id number] PCP:  Lucky Cowboy, MD  Cardiologist:  Verne Carrow, MD   Chief Complaint: 1 year f/u of CAD, cardiomyopathy  History of Present Illness:  [name] is a 72 y.o. male with history of HTN, recurrent pulmonary emboli on anticoagulation, mild nonobstructive CAD, non-ischemic cardiomyopathy EF 35-40%, MV repair 2014, sleep apnea, OA, pre-diabetes, statin intolerance, CKD III by labs, bradycardia who is here today for cardiac follow up. He had a hip replacement 2009 and presented with SOB March 2010 and was found to have bilateral pulmonary emboli with recurrence in 2012 off of anti-coagulation therapy. In 06/2012 he underwent cardiac cath given echo showing severe mitral regurgitation - EF was preserved at that time, 50-55%, with mild nonobstructive CAD (40% prox LAD, 30% OM2, otherwise mild irregularities). He underwent minimally invasive mitral valve repair with copmlex valvuloplasty - Sorin Memo 3D Ring Annuloplasty (size 64mm). Cardiac monitor September 2014 in setting of dizziness showed sinus rhythm with PVCs. F/u echo 07/2014 showed EF 35-40% with diffuse HK, with intact MV repair and trivial residual MR, mildly dilated LA. At last OV 07/2016 with Dr. Clifton James he was felt to be doing well, not on BB due to bradycardia. Plan was for repeat echo 07/2017. Last labs 07/2017 showed K 4.0, Cr 1.55 (c/w prior), normal CBC, INR 2.39; 05/2017 - A1C 6.5, LDL 100, LFTs OK, Mg 1.9, TSH wnl.  He returns for yearly follow-up. In general he's had an uneventful year except for a trip-and-fall at the farmer's market a few weeks ago. He was carrying several flats of flowers and his forward momentum picked up and he fell over. He received stitches to his face and chipped a tooth. He reports NYHA class II chronic dyspnea with higher levels of exertion, unchanged. No chest pain or palpitations. Initial BP 138/92  which he stated was high for him, personal recheck by me was 128/72.   Past Medical History:  Diagnosis Date  . Allergy    SEASONAL  . Cataract    SMALL IN LEFT EYE  . CKD (chronic kidney disease), stage III (HCC)   . Coronary artery disease    a. Cath 2014 - mild nonobstructive CAD - 40% prox LAD, 30% OM2, otherwise mild irregularities.  . Degenerative joint disease   . Hyperlipidemia   . Hypertension   . Inguinal hernia    right  . Osteoarthritis   . Pre-diabetes   . Pulmonary embolism [facility])    march 2010, 2012              /   DVT x  2  LEFT 2010  . S/P mitral valve repair 08/15/2012   Complex valvuloplasty including triangular resection of posterior leaflet, artificial Gore-tex neocord placement x4 and Sorin Memo 3D ring annuloplasty via right mini thoracotomy approach  . Seasonal allergies   . Severe mitral regurgitation by prior echocardiogram    a. s/p MV repair 2014.  . Sleep apnea    STOP BANG SCORE 4  . Vitamin D deficiency   . Wears glasses     Past Surgical History:  Procedure Laterality Date  . achillis tendon     repair 20 years ago  . CARDIAC CATHETERIZATION    . COLONOSCOPY    . COLONOSCOPY W/ BIOPSIES AND POLYPECTOMY    . HIP ARTHROPLASTY     11/06/07  . INGUINAL HERNIA REPAIR Right 11/07/2015  Procedure: LAPAROSCOPIC RIGHT INGUINAL HERNIA WITH MESH;  Surgeon: Axel Filler, MD;  Location: [facility] OR;  Service: General;  Laterality: Right;  . INSERTION OF MESH Right 11/07/2015   Procedure: INSERTION OF MESH;  Surgeon: Axel Filler, MD;  Location[facility] OR;  Service: General;  Laterality: Right;  . INTRAOPERATIVE TRANSESOPHAGEAL ECHOCARDIOGRAM N/A 08/15/2012   Procedure: INTRAOPERATIVE TRANSESOPHAGEAL ECHOCARDIOGRAM;  Surgeon: Purcell Nails, MD;  Location: [facility] OR;  Service: Open Heart Surgery;  Laterality: N/A;  . MITRAL VALVE REPAIR Right 08/15/2012   Procedure: MINIMALLY INVASIVE MITRAL VALVE REPAIR (MVR);  Surgeon: Purcell Nails, MD;  Location: [facility] OR;   Service: Open Heart Surgery;  Laterality: Right;  . TEE WITHOUT CARDIOVERSION N/A 07/04/2012   Procedure: TRANSESOPHAGEAL ECHOCARDIOGRAM (TEE);  Surgeon: Laurey Morale, MD;  Location: [facility] ENDOSCOPY;  Service: Cardiovascular;  Laterality: N/A;  . TOTAL HIP ARTHROPLASTY  08/24/2011   Procedure: TOTAL HIP ARTHROPLASTY ANTERIOR APPROACH;  Surgeon: Shelda Pal, MD;  Location: [facility] ORS;  Service: Orthopedics;  Laterality: Right;    Current Medications: Current Meds  Medication Sig  . acetaminophen (TYLENOL) 500 MG tablet Take 500 mg by mouth every 6 (six) hours as needed for pain.  Marland Kitchen amoxicillin (AMOXIL) 500 MG capsule Take 1 capsule (500 mg total) by mouth 3 (three) times daily.  . benazepril (LOTENSIN) 20 MG tablet TAKE 1 TABLET BY MOUTH ONCE DAILY  . chlorhexidine (PERIDEX) 0.12 % solution Use as directed 15 mLs in the mouth or throat 2 (two) times daily.  . Cholecalciferol (VITAMIN D PO) Take by mouth daily. TAKING 5,000 UNITS DAILY  . CINNAMON PO Takes 2 tablets (2000mg  ) daily  . cyclobenzaprine (FLEXERIL) 10 MG tablet Take 1 tablet (10 mg total) by mouth 2 (two) times daily as needed for muscle spasms.  Marland Kitchen ezetimibe (ZETIA) 10 MG tablet TAKE 1 TABLET BY MOUTH DAILY FOR CHOLESTEROL  . fenofibrate micronized (LOFIBRA) 134 MG capsule TAKE 1 CAPSULE BY MOUTH EVERY EVENING.  Marland Kitchen loratadine (CLARITIN) 10 MG tablet Take 10 mg by mouth daily as needed for allergies.  . mometasone (NASONEX) 50 MCG/ACT nasal spray Place 2 sprays into the nose daily as needed (allergies).   . Psyllium ([employer] NATURAL DAILY FIBER PO) Take 1 tablet by mouth daily.  . tamsulosin (FLOMAX) 0.4 MG CAPS capsule Take 1 capsule at bedtime for prostate  . warfarin (COUMADIN) 5 MG tablet TAKE AS DIRECTED BY COUMADIN CLINIC. (Patient taking differently: Take 5mg  on Saturdays, Sundays, Tuesdays & Thursdays, and 10mg  on Mondays, Wednesdays & Fridays)    Allergies:   Statins and Welchol [colesevelam hcl]   Social History    Socioeconomic History  . Marital status: Married    Spouse name: Not on file  . Number of children: 3  . Years of education: Not on file  . Highest education level: Not on file  Occupational History  . Occupation: Marketing executive: [employer]  Social Needs  . Financial resource strain: Not on file  . Food insecurity:    Worry: Not on file    Inability: Not on file  . Transportation needs:    Medical: Not on file    Non-medical: Not on file  Tobacco Use  . Smoking status: Former Smoker    Types: Cigars    Last attempt to quit: 03/22/1973    Years since quitting: 44.4  . Smokeless tobacco: Current User    Types: Chew  . Tobacco comment: uses smokeless tobacco  Substance  and Sexual Activity  . Alcohol use: No    Alcohol/week: 0.0 oz  . Drug use: No  . Sexual activity: Not Currently  Lifestyle  . Physical activity:    Days per week: Not on file    Minutes per session: Not on file  . Stress: Not on file  Relationships  . Social connections:    Talks on phone: Not on file    Gets together: Not on file    Attends religious service: Not on file    Active member of club or organization: Not on file    Attends meetings of clubs or organizations: Not on file    Relationship status: Not on file  Other Topics Concern  . Not on file  Social History Narrative  . Not on file     Family History:  The patient's family history includes Coronary artery disease in his unknown relative; Diabetes in his brother and sister; Heart attack in his father; Heart attack (age of onset: 35) in his brother; Hyperlipidemia in his sister; Hypertension in his brother, father, and sister.  ROS:   Please see the history of present illness.  All other systems are reviewed and otherwise negative.    PHYSICAL EXAM:   VS:  BP (!) 138/92   Pulse 77   Ht 6' 0.5" (1.842 m)   Wt 221 lb (100.2 kg)   SpO2 97%   BMI 29.56 kg/m   BMI: Body mass index is 29.56 kg/m.  recheck BP 128/72. GEN: Well nourished, well developed AAM, in no acute distress HEENT: normocephalic, atraumatic Neck: no JVD, carotid bruits, or masses Cardiac: RRR occasional ectopy no murmurs, rubs, or gallops, no edema  Respiratory:  clear to auscultation bilaterally, normal work of breathing GI: soft, nontender, nondistended, + BS MS: no deformity or atrophy Skin: warm and dry, no rash Neuro:  Alert and Oriented x 3, Strength and sensation are intact, follows commands Psych: euthymic mood, full affect  Wt Readings from Last 3 Encounters:  08/16/17 221 lb (100.2 kg)  07/28/17 222 lb 12.8 oz (101.1 kg)  07/26/17 221 lb (100.2 kg)      Studies/Labs Reviewed:   EKG:  EKG was ordered today and personally reviewed by me and demonstrates NSR 77bpm with occasional PVCs, IRBBB, prior septal and inferior infarct. PVCs seen on 07/2016 EKG.  Recent Labs: 05/24/2017: ALT 18; Magnesium 1.9; TSH 1.07 07/23/2017: BUN 15; Creatinine, Ser 1.55; Hemoglobin 15.7; Platelets 232; Potassium 4.0; Sodium 138   Lipid Panel    Component Value Date/Time   CHOL 171 05/24/2017 1020   TRIG 65 05/24/2017 1020   HDL 56 05/24/2017 1020   CHOLHDL 3.1 05/24/2017 1020   VLDL 14 10/19/2016 1030   LDLCALC 100 (H) 05/24/2017 1020    Additional studies/ records that were reviewed today include: Summarized above.   ASSESSMENT & PLAN:   1. CAD - no chest pain suggestive of angina. Mild disease noted on prior evaluation. Not on ASA due to concomitant warfarin. Lipids followed by primary care. 2. Nonischemic cardiomyopathy - sometime between valve repair 2014 in and echo 2016, his LV function dropped. It's not clear that we really know why. Dr. Clifton James recommended repeat echo 07/2017, will arrange. It is also noted he has occasional PVCs on EKG both this year and last year. Will obtain 24-hour holter to assess burden and general HR. It has been said in the past that he is not on BB due to bradycardia. Recent HR has  been  in the 70s-80s on Epic but there are some values in the 50's in the past. Will assess with event monitor. Consideration could be given to a trial of carvedilol low dose if acceptable. Reviewed 2g sodium restriction, 2L fluid restriction, daily weights with patient. He appears euvolemic and it does not appear that he's actually had issues with clinical CHF before. He is on ACEI.  3. MR s/p MV repair - repeat echo. SBE prophylaxis reviewed. He was aware of this and states his dentist also keeps on top of reminding him as well. 4. HTN - initial check by tech was mildly elevated but recheck by me WNL. 5. Recurrent DVT/PE - remains on Coumadin. CBC followed by PCP. He did state he has an evaluation in the works for the recent disequilibrium he experienced with the fall. We discussed assistive devices as a potential precaution as well. 6. PVCs - as above, will quantify by 24-hour holter. Recent Mg/K were not significantly low.  Disposition: F/u with Dr. Clifton James in 6 months, sooner if needed.  Medication Adjustments/Labs and Tests Ordered: Current medicines are reviewed at length with the patient today.  Concerns regarding medicines are outlined above. Medication changes, Labs and Tests ordered today are summarized above and listed in the Patient Instructions accessible in Encounters.   Signed, Laurann Montana, PA-C  08/16/2017 9:21 AM    [facility] [address] [place], [place], [place]  [address] Phone: [phone]; Fax: [phone]

## 2017-08-16 ENCOUNTER — Ambulatory Visit (INDEPENDENT_AMBULATORY_CARE_PROVIDER_SITE_OTHER): Payer: 59 | Admitting: Physician Assistant

## 2017-08-16 ENCOUNTER — Encounter: Payer: Self-pay | Admitting: Physician Assistant

## 2017-08-16 VITALS — BP 128/72 | HR 77 | Ht 72.5 in | Wt 221.0 lb

## 2017-08-16 DIAGNOSIS — I2510 Atherosclerotic heart disease of native coronary artery without angina pectoris: Secondary | ICD-10-CM

## 2017-08-16 DIAGNOSIS — I10 Essential (primary) hypertension: Secondary | ICD-10-CM

## 2017-08-16 DIAGNOSIS — Z9889 Other specified postprocedural states: Secondary | ICD-10-CM

## 2017-08-16 DIAGNOSIS — I2699 Other pulmonary embolism without acute cor pulmonale: Secondary | ICD-10-CM

## 2017-08-16 DIAGNOSIS — I493 Ventricular premature depolarization: Secondary | ICD-10-CM

## 2017-08-16 DIAGNOSIS — I428 Other cardiomyopathies: Secondary | ICD-10-CM | POA: Diagnosis not present

## 2017-08-16 NOTE — Patient Instructions (Addendum)
Medication Instructions:  Your physician recommends that you continue on your current medications as directed. Please refer to the Current Medication list given to you today.   Labwork: None ordere  Testing/Procedures: Your physician has requested that you have an echocardiogram. Echocardiography is a painless test that uses sound waves to create images of your heart. It provides your doctor with information about the size and shape of your heart and how well your heart's chambers and valves are working. This procedure takes approximately one hour. There are no restrictions for this procedure.  Your physician has recommended that you wear a holter monitor. Holter monitors are medical devices that record the heart's electrical activity. Doctors most often use these monitors to diagnose arrhythmias. Arrhythmias are problems with the speed or rhythm of the heartbeat. The monitor is a small, portable device. You can wear one while you do your normal daily activities. This is usually used to diagnose what is causing palpitations/syncope (passing out).    Follow-Up: Your physician wants you to follow-up in: 6 MONTHS WITH DR. Clifton Griffin   You will receive a reminder letter in the mail two months in advance. If you don't receive a letter, please call our office to schedule the follow-up appointment.   Any Other Special Instructions Will Be Listed Below (If Applicable).     If you need a refill on your cardiac medications before your next appointment, please call your pharmacy.   Endocarditis Information  You may be at risk for developing endocarditis since you have an artificial heart valve  or a repaired heart valve. Endocarditis is an infection of the lining of the heart or heart valves.   Certain surgical and dental procedures may put you at risk,  such as teeth cleaning or other dental procedures or any surgery involving the respiratory, urinary, gastrointestinal tract, gallbladder or  prostate.   Notify your doctor or dentist before having any invasive procedures. You will need to take antibiotics before certain procedures.   To prevent endocarditis, maintain good oral health. Seek prompt medical attention for any mouth/gum, skin or urinary tract infections.  For patients with weakened heart muscles, we give them these special instructions:  1. Follow a low-salt diet - you are allowed no more than 2,000mg  of sodium per day. Watch your fluid intake. In general, you should not be taking in more than 2 liters of fluid per day (no more than 8 glasses per day). This includes sources of water in foods like soup, coffee, tea, milk, etc. 2. Weigh yourself on the same scale at same time of day and keep a log. 3. Call your doctor: (Anytime you feel any of the following symptoms)  - 3lb weight gain overnight or 5lb within a few days - Shortness of breath, with or without a dry hacking cough  - Swelling in the hands, feet or stomach  - If you have to sleep on extra pillows at night in order to breathe   IT IS IMPORTANT TO LET YOUR DOCTOR KNOW EARLY ON IF YOU ARE HAVING SYMPTOMS SO WE CAN HELP YOU!   Echocardiogram An echocardiogram, or echocardiography, uses sound waves (ultrasound) to produce an image of your heart. The echocardiogram is simple, painless, obtained within a short period of time, and offers valuable information to your health care provider. The images from an echocardiogram can provide information such as:  Evidence of coronary artery disease (CAD).  Heart size.  Heart muscle function.  Heart valve function.  Aneurysm detection.  Evidence of a past heart attack.  Fluid buildup around the heart.  Heart muscle thickening.  Assess heart valve function.  Tell a health care provider about:  Any allergies you have.  All medicines you are taking, including vitamins, herbs, eye drops, creams, and over-the-counter medicines.  Any problems you or family  members have had with anesthetic medicines.  Any blood disorders you have.  Any surgeries you have had.  Any medical conditions you have.  Whether you are pregnant or may be pregnant. What happens before the procedure? No special preparation is needed. Eat and drink normally. What happens during the procedure?  In order to produce an image of your heart, gel will be applied to your chest and a wand-like tool (transducer) will be moved over your chest. The gel will help transmit the sound waves from the transducer. The sound waves will harmlessly bounce off your heart to allow the heart images to be captured in real-time motion. These images will then be recorded.  You may need an IV to receive a medicine that improves the quality of the pictures. What happens after the procedure? You may return to your normal schedule including diet, activities, and medicines, unless your health care provider tells you otherwise. This information is not intended to replace advice given to you by your health care provider. Make sure you discuss any questions you have with your health care provider. Document Released: 03/05/2000 Document Revised: 10/25/2015 Document Reviewed: 11/13/2012 Elsevier Interactive Patient Education  2017 [employer].   Holter Monitoring A Holter monitor is a small device that is used to detect abnormal heart rhythms. It clips to your clothing and is connected by wires to flat, sticky disks (electrodes) that attach to your chest. It is worn continuously for 24-48 hours. Follow these instructions at home:  Wear your Holter monitor at all times, even while exercising and sleeping, for as long as directed by your health care provider.  Make sure that the Holter monitor is safely clipped to your clothing or close to your body as recommended by your health care provider.  Do not get the monitor or wires wet.  Do not put body lotion or moisturizer on your chest.  Keep your skin  clean.  Keep a diary of your daily activities, such as walking and doing chores. If you feel that your heartbeat is abnormal or that your heart is fluttering or skipping a beat: ? Record what you are doing when it happens. ? Record what time of day the symptoms occur.  Return your Holter monitor as directed by your health care provider.  Keep all follow-up visits as directed by your health care provider. This is important. Get help right away if:  You feel lightheaded or you faint.  You have trouble breathing.  You feel pain in your chest, upper arm, or jaw.  You feel sick to your stomach and your skin is pale, cool, or damp.  You heartbeat feels unusual or abnormal. This information is not intended to replace advice given to you by your health care provider. Make sure you discuss any questions you have with your health care provider. Document Released: 12/05/2003 Document Revised: 08/14/2015 Document Reviewed: 10/15/2013 Elsevier Interactive Patient Education  [employer].

## 2017-08-23 MED FILL — HYDROCODON-APAP 5-325: 5-325 | 1 days supply | Qty: 4 | Fill #0

## 2017-08-25 ENCOUNTER — Ambulatory Visit: Payer: 59 | Attending: Adult Health

## 2017-08-25 ENCOUNTER — Other Ambulatory Visit: Payer: Self-pay

## 2017-08-25 DIAGNOSIS — R2689 Other abnormalities of gait and mobility: Secondary | ICD-10-CM | POA: Diagnosis not present

## 2017-08-25 DIAGNOSIS — M6281 Muscle weakness (generalized): Secondary | ICD-10-CM | POA: Insufficient documentation

## 2017-08-25 DIAGNOSIS — R2681 Unsteadiness on feet: Secondary | ICD-10-CM | POA: Insufficient documentation

## 2017-08-25 NOTE — Therapy (Signed)
[facility] [address] [place], [place], [address] Phone: [phone]   Fax:  [phone]  Physical Therapy Evaluation  Patient Details  Name: [name] MRN: [id number] Date of Birth: 12-06-1945 Referring Provider: Judd Gaudier, NP   Encounter Date: 08/25/2017  PT End of Session - 08/25/17 1149    Visit Number  1    Number of Visits  17    Date for PT Re-Evaluation  10/24/17    Authorization Type  [facility] [employer] and Medicare    PT Start Time  [phone]    PT Stop Time  0929    PT Time Calculation (min)  42 min    Equipment Utilized During Treatment  -- min guard to S prn    Activity Tolerance  Patient tolerated treatment well    Behavior During Therapy  [facility] for tasks assessed/performed       Past Medical History:  Diagnosis Date  . Allergy    SEASONAL  . Cataract    SMALL IN LEFT EYE  . CKD (chronic kidney disease), stage III (HCC)   . Coronary artery disease    a. Cath 2014 - mild nonobstructive CAD - 40% prox LAD, 30% OM2, otherwise mild irregularities.  . Degenerative joint disease   . Hyperlipidemia   . Hypertension   . Inguinal hernia    right  . Osteoarthritis   . Pre-diabetes   . Pulmonary embolism [facility])    march 2010, 2012              /   DVT x  2  LEFT 2010  . S/P mitral valve repair 08/15/2012   Complex valvuloplasty including triangular resection of posterior leaflet, artificial Gore-tex neocord placement x4 and Sorin Memo 3D ring annuloplasty via right mini thoracotomy approach  . Seasonal allergies   . Severe mitral regurgitation by prior echocardiogram    a. s/p MV repair 2014.  . Sleep apnea    STOP BANG SCORE 4  . Vitamin D deficiency   . Wears glasses     Past Surgical History:  Procedure Laterality Date  . achillis tendon     repair 20 years ago  . CARDIAC CATHETERIZATION    . COLONOSCOPY    . COLONOSCOPY W/ BIOPSIES AND POLYPECTOMY    . HIP ARTHROPLASTY     11/06/07  . INGUINAL  HERNIA REPAIR Right 11/07/2015   Procedure: LAPAROSCOPIC RIGHT INGUINAL HERNIA WITH MESH;  Surgeon: Axel Filler, MD;  Location: [facility] OR;  Service: General;  Laterality: Right;  . INSERTION OF MESH Right 11/07/2015   Procedure: INSERTION OF MESH;  Surgeon: Axel Filler, MD;  Location: [facility] OR;  Service: General;  Laterality: Right;  . INTRAOPERATIVE TRANSESOPHAGEAL ECHOCARDIOGRAM N/A 08/15/2012   Procedure: INTRAOPERATIVE TRANSESOPHAGEAL ECHOCARDIOGRAM;  Surgeon: Purcell Nails, MD;  Location: [facility] OR;  Service: Open Heart Surgery;  Laterality: N/A;  . MITRAL VALVE REPAIR Right 08/15/2012   Procedure: MINIMALLY INVASIVE MITRAL VALVE REPAIR (MVR);  Surgeon: Purcell Nails, MD;  Location: [facility] OR;  Service: Open Heart Surgery;  Laterality: Right;  . TEE WITHOUT CARDIOVERSION N/A 07/04/2012   Procedure: TRANSESOPHAGEAL ECHOCARDIOGRAM (TEE);  Surgeon: Laurey Morale, MD;  Location: [facility] ENDOSCOPY;  Service: Cardiovascular;  Laterality: N/A;  . TOTAL HIP ARTHROPLASTY  08/24/2011   Procedure: TOTAL HIP ARTHROPLASTY ANTERIOR APPROACH;  Surgeon: Shelda Pal, MD;  Location: [facility] ORS;  Service: Orthopedics;  Laterality: Right;    There were no vitals filed for  this visit.   Subjective Assessment - 08/25/17 0855    Subjective  Pt reported he has experienced 2 falls this year, one on ice and one while walking with flowers in his hands (fell forward). He also tripped last night while taking out the trash last night but the rail caught him. Pt changed to a flatter tennis shoes and now has more control, as he doesn't feel he picks his feet up high enough. Pt takes medication at night. Pt had a imaging done at hospital and was found minimally displaced fracture through the alveolar ridge. Pt had two root canals done yesterday as a result. Pt is going on vacation on 09/19/17 to [place]. Pt has had L shoulder pain since fall but thinks it's arthritis.    Pertinent History  HTN, DM with CKD stage 3, OA, mitral valve  replacement, DDD, L cataract, CAD, HLD, hx of L DVT and PE, sleep apnea, inguinal hernia repair    Patient Stated Goals  Be more stable when walking and not fall.     Currently in Pain?  Yes    Pain Score  6     Pain Location  Neck    Pain Orientation  Posterior    Pain Descriptors / Indicators  Sore    Pain Onset  More than a month ago    Pain Frequency  Intermittent    Aggravating Factors   starting after last fall, siting watching TV, maintaining positions for long periods of time    Pain Relieving Factors  Pain control lotion?, ice         [facility] PT Assessment - 08/25/17 0904      Assessment   Medical Diagnosis  Falls, imbalance    Referring Provider  Judd Gaudier, NP    Onset Date/Surgical Date  08/26/15 last few years balance has been getting worse    Hand Dominance  Right    Prior Therapy  none for falls, but ortho s/p THA      Precautions   Precautions  Fall    Precaution Comments  based on DGI score and hx.       Restrictions   Weight Bearing Restrictions  No      Balance Screen   Has the patient fallen in the past 6 months  Yes    How many times?  1 2 in the last year    Has the patient had a decrease in activity level because of a fear of falling?   Yes    Is the patient reluctant to leave their home because of a fear of falling?   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      Coordination   Gross Motor Movements are Fluid and Coordinated  Yes    Fine Motor Movements are Fluid and Coordinated  Yes    Heel Shin Test  WNL      Posture/Postural Control   Posture/Postural Control  Postural limitations    Postural Limitations  Forward head;Flexed trunk trunk shifted to the R side      ROM / Strength   AROM / PROM / Strength  AROM;Strength      AROM   Overall AROM   Deficits    Overall AROM Comments  Pt unable to raise L UE overhead past 110 degrees 2/2 pain. Otherwise, BUE/LE AROM.       Strength   Overall Strength  Deficits    Overall Strength Comments  B UE strength WNL, except unable to raise LUE over 110 degrees 2/2 pain. BLE: 4+/5 grossly, except for L hip abd: 3+/5 and hip ext not formally tested but likely weak 2/2 gait deviations.       Transfers   Transfers  Sit to Stand;Stand to Sit    Sit to Stand  5: Supervision;With upper extremity assist;From chair/3-in-1    Stand to Sit  5: Supervision;With upper extremity assist;To chair/3-in-1      Ambulation/Gait   Ambulation/Gait  Yes    Ambulation/Gait Assistance  5: Supervision    Ambulation/Gait Assistance Details  S to ensure safety. Intermittent decr. heel strike. Discussed trialing AD next visit to improve safety and decr. gait deviations.     Ambulation Distance (Feet)  200 Feet    Assistive device  None    Gait Pattern  Step-through pattern;Decreased stride length;Decreased weight shift to left;Decreased dorsiflexion - left;Decreased dorsiflexion - right;Antalgic;Trunk flexed;Lateral trunk lean to right;Decreased arm swing - left    Ambulation Surface  Level;Indoor    Gait velocity  2.34ft/sec.      Standardized Balance Assessment   Standardized Balance Assessment  Dynamic Gait Index;Timed Up and Go Test       Dynamic Gait Index   Level Surface  Moderate Impairment    Change in Gait Speed  Moderate Impairment    Gait with Horizontal Head Turns  Moderate Impairment    Gait with Vertical Head Turns  Moderate Impairment    Gait and Pivot Turn  Mild Impairment    Step Over Obstacle  Severe Impairment    Step Around Obstacles  Mild Impairment    Steps  Mild Impairment    Total Score  10    DGI comment:  10/24: indicates pt is at high risk for falls.       Timed Up and Go Test   TUG  Normal TUG    Normal TUG (seconds)  16.49 no AD                Objective measurements completed on examination: See above findings.              PT Education - 08/25/17 1148    Education Details  PT discussed outcome measure results, PT POC, frequency, and duration.     Person(s) Educated  Patient    Methods  Explanation    Comprehension  Verbalized understanding       PT Short Term Goals - 08/25/17 1158      PT SHORT TERM  GOAL #1   Title  Pt will be IND with HEP to improve balance, strength, and flexibility. TARGET DATE FOR ALL STGS: 09/22/17    Status  New      PT SHORT TERM GOAL #2   Title  Pt will amb. 300' over even terrain with LRAD at MOD I level to improve safety during functional mobility.     Status  New      PT SHORT TERM GOAL #3   Title  Pt will report zero falls while on vacation to improve safety.     Status  New      PT SHORT TERM GOAL #4   Title  Pt will perform TUG with LRAD in </=13.5sec. to decr. falls risk.     Status  New      PT SHORT TERM GOAL #5   Title  Pt will improve DGI score to >/=14/24 to decr. falls risk.     Status  New        PT Long Term Goals - 08/25/17 1200      PT LONG TERM GOAL #1   Title  Pt will improve DGI score to >/=20/24 to decr. falls risk. TARGET DATE FOR ALL LTGS: 10/20/17    Status  New      PT LONG TERM GOAL #2   Title  Pt will amb. 600' with LRAD over even/uneven terrain with LRAD to improve safety during functional  mobility.     Status  New      PT LONG TERM GOAL #3   Title  Pt will report zero falls in the last 4 weeks to improve safety.     Status  New      PT LONG TERM GOAL #4   Title  Pt will amb. 200' over grassy terrain, while carrying a light object to mimic gardening, IND to safely perform yard work.     Status  New             Plan - 08/25/17 1150    Clinical Impression Statement  Pt is a pleasant 72y/o male presenting to [facility] neuro for falls and imbalance. Pt's PMH significant for the following: HTN, DM with CKD stage 3, OA, mitral valve replacement, DDD, L cataract, CAD, HLD, hx of L DVT and PE, sleep apnea, inguinal hernia repair. Pt's gait speed was just above the safe community ambulator speed. However, pt noted to experience unsteadiness during dynamic gait activities. Pt's TUG time and DGI score both indicate pt is at a high risk for falls and has hx of falling. The following deficits were noted upon exam: gait deviations, impaired posture, decr. strength, impaired balance, L shoulder pain s/p fall, and decr. flexibility. Pt would benefit from skilled PT to improve safety during functional mobility.     History and Personal Factors relevant to plan of care:  He travels often with wife (out of state and country), still works part-time    Clinical Presentation  Stable    Clinical Presentation due to:  HTN, DM with CKD stage 3, OA, mitral valve replacement, DDD, L cataract, CAD, HLD, hx of L DVT and PE, sleep apnea, inguinal hernia repair    Clinical Decision Making  Moderate    Rehab Potential  Good    Clinical Impairments Affecting Rehab Potential  see above    PT Frequency  2x / week    PT Duration  8 weeks    PT Treatment/Interventions  ADLs/Self Care Home Management;Biofeedback;Therapeutic  activities;Therapeutic exercise;Electrical Stimulation;Functional mobility training;Stair training;Gait training;Patient/family education;Orthotic Fit/Training;DME Instruction;Neuromuscular  re-education;Balance training;Vestibular    PT Next Visit Plan  Establish strengthening (Hip abd/ext), balance, and flexibility HEP. Assess L shoulder and treat as indicated (as pt reported it has been hurting since fall and limits arm swing during gait). Pt reports L SLS causes L knee to buckle after THA.    Consulted and Agree with Plan of Care  Patient       Patient will benefit from skilled therapeutic intervention in order to improve the following deficits and impairments:  Abnormal gait, Decreased endurance, Impaired UE functional use, Decreased knowledge of use of DME, Decreased strength, Decreased balance, Decreased mobility, Decreased range of motion, Impaired flexibility, Postural dysfunction  Visit Diagnosis: Other abnormalities of gait and mobility - Plan: PT plan of care cert/re-cert  Muscle weakness (generalized) - Plan: PT plan of care cert/re-cert  Unsteadiness on feet - Plan: PT plan of care cert/re-cert     Problem List Patient Active Problem List   Diagnosis Date Noted  . Encounter for therapeutic drug monitoring 08/10/2017  . Encounter for Medicare annual wellness exam 02/23/2016  . Hx of colonic polyps 05/31/2014  . Obesity 04/24/2014  . Medication management 05/24/2013  . DM type 2 causing CKD stage 3 (HCC) 05/24/2013  . Hyperlipidemia   . Hypertension   . Seasonal allergies   . Osteoarthritis   . Vitamin D deficiency   . S/P mitral valve repair 08/15/2012  . Right carotid bruit 06/14/2011  . Long term current use of anticoagulant therapy 11/05/2010    Miller,Jennifer L 08/25/2017, 12:04 PM  [facility] [address] [place], [place], [address] Phone: [phone]   Fax:  [phone]  Name: TANNON PEERSON MRN: [id number] Date of Birth: [birth year]  Zerita Boers, PT,DPT 08/25/17 12:04 PM Phone: [phone] Fax: [phone]

## 2017-08-30 ENCOUNTER — Ambulatory Visit (INDEPENDENT_AMBULATORY_CARE_PROVIDER_SITE_OTHER): Payer: 59

## 2017-08-30 ENCOUNTER — Other Ambulatory Visit: Payer: Self-pay

## 2017-08-30 ENCOUNTER — Ambulatory Visit (HOSPITAL_COMMUNITY): Payer: 59 | Attending: Cardiovascular Disease

## 2017-08-30 ENCOUNTER — Ambulatory Visit (INDEPENDENT_AMBULATORY_CARE_PROVIDER_SITE_OTHER): Payer: 59 | Admitting: *Deleted

## 2017-08-30 DIAGNOSIS — E785 Hyperlipidemia, unspecified: Secondary | ICD-10-CM | POA: Insufficient documentation

## 2017-08-30 DIAGNOSIS — Z87891 Personal history of nicotine dependence: Secondary | ICD-10-CM | POA: Diagnosis not present

## 2017-08-30 DIAGNOSIS — Z9889 Other specified postprocedural states: Secondary | ICD-10-CM | POA: Diagnosis not present

## 2017-08-30 DIAGNOSIS — E119 Type 2 diabetes mellitus without complications: Secondary | ICD-10-CM | POA: Diagnosis not present

## 2017-08-30 DIAGNOSIS — I493 Ventricular premature depolarization: Secondary | ICD-10-CM | POA: Diagnosis not present

## 2017-08-30 DIAGNOSIS — Z5181 Encounter for therapeutic drug level monitoring: Secondary | ICD-10-CM

## 2017-08-30 DIAGNOSIS — I119 Hypertensive heart disease without heart failure: Secondary | ICD-10-CM | POA: Diagnosis not present

## 2017-08-30 DIAGNOSIS — I428 Other cardiomyopathies: Secondary | ICD-10-CM | POA: Diagnosis not present

## 2017-08-30 DIAGNOSIS — I340 Nonrheumatic mitral (valve) insufficiency: Secondary | ICD-10-CM | POA: Diagnosis not present

## 2017-08-30 DIAGNOSIS — I2699 Other pulmonary embolism without acute cor pulmonale: Secondary | ICD-10-CM

## 2017-08-30 DIAGNOSIS — Z7901 Long term (current) use of anticoagulants: Secondary | ICD-10-CM

## 2017-08-30 LAB — POCT INR: INR: 2.9 (ref 2.0–3.0)

## 2017-08-30 NOTE — Patient Instructions (Signed)
Description   Continue taking  same dosage 1 tablet daily except 2 tablets on Mondays, Wednesdays and Fridays.  Recheck in 4 weeks. Please call with any medication changes or if scheduled for any procedures [facility]# [phone]. Main # [phone].     

## 2017-09-02 ENCOUNTER — Telehealth: Payer: Self-pay

## 2017-09-02 MED ORDER — CARVEDILOL 3.125 MG PO TABS: 3.1250 mg | ORAL_TABLET | Freq: Two times a day (BID) | ORAL | 3 refills | Status: DC

## 2017-09-02 MED FILL — CARVEDILOL 3.125 MG TABLET: 3.125 | 90 days supply | Qty: 180 | Fill #0

## 2017-09-02 MED FILL — AMOXICILLIN 500 MG CAPSULE: 500 | 4 days supply | Qty: 16 | Fill #0

## 2017-09-02 NOTE — Telephone Encounter (Signed)
Notes recorded by Kathleene Hazel, MD on 09/02/2017 at 8:04 AM EDT Can we let him know that his monitor shows early beats (PVCs and PACs)? I would recommend Coreg 3.125 mg po BID. [name]    Informed patient of results and verbal understanding expressed.  Instructed patient to START COREG 3.125 mg BID. He was grateful for call and agrees with treatment plan.

## 2017-09-06 ENCOUNTER — Ambulatory Visit: Payer: 59

## 2017-09-06 DIAGNOSIS — M6281 Muscle weakness (generalized): Secondary | ICD-10-CM | POA: Diagnosis not present

## 2017-09-06 DIAGNOSIS — R2681 Unsteadiness on feet: Secondary | ICD-10-CM

## 2017-09-06 DIAGNOSIS — R2689 Other abnormalities of gait and mobility: Secondary | ICD-10-CM

## 2017-09-06 NOTE — Therapy (Signed)
[facility] [address] [place], [place], [address] Phone: [phone]   Fax:  [phone]  Physical Therapy Treatment  Patient Details  Name: [name] MRN: [id number] Date of Birth: [birth year] Referring Provider: Judd Gaudier, NP   Encounter Date: 09/06/2017  PT End of Session - 09/06/17 0904    Visit Number  2    Number of Visits  17    Date for PT Re-Evaluation  10/24/17    Authorization Type  [facility] [employer] and Medicare    PT Start Time  0805    PT Stop Time  0845    PT Time Calculation (min)  40 min    Activity Tolerance  Patient tolerated treatment well    Behavior During Therapy  [facility] for tasks assessed/performed       Past Medical History:  Diagnosis Date  . Allergy    SEASONAL  . Cataract    SMALL IN LEFT EYE  . CKD (chronic kidney disease), stage III (HCC)   . Coronary artery disease    a. Cath 2014 - mild nonobstructive CAD - 40% prox LAD, 30% OM2, otherwise mild irregularities.  . Degenerative joint disease   . Hyperlipidemia   . Hypertension   . Inguinal hernia    right  . Osteoarthritis   . Pre-diabetes   . Pulmonary embolism [facility])    march 2010, 2012              /   DVT x  2  LEFT 2010  . S/P mitral valve repair 08/15/2012   Complex valvuloplasty including triangular resection of posterior leaflet, artificial Gore-tex neocord placement x4 and Sorin Memo 3D ring annuloplasty via right mini thoracotomy approach  . Seasonal allergies   . Severe mitral regurgitation by prior echocardiogram    a. s/p MV repair 2014.  . Sleep apnea    STOP BANG SCORE 4  . Vitamin D deficiency   . Wears glasses     Past Surgical History:  Procedure Laterality Date  . achillis tendon     repair 20 years ago  . CARDIAC CATHETERIZATION    . COLONOSCOPY    . COLONOSCOPY W/ BIOPSIES AND POLYPECTOMY    . HIP ARTHROPLASTY     11/06/07  . INGUINAL HERNIA REPAIR Right 11/07/2015   Procedure: LAPAROSCOPIC RIGHT  INGUINAL HERNIA WITH MESH;  Surgeon: Axel Filler, MD;  Location: [facility] OR;  Service: General;  Laterality: Right;  . INSERTION OF MESH Right 11/07/2015   Procedure: INSERTION OF MESH;  Surgeon: Axel Filler, MD;  Location: [facility] OR;  Service: General;  Laterality: Right;  . INTRAOPERATIVE TRANSESOPHAGEAL ECHOCARDIOGRAM N/A 08/15/2012   Procedure: INTRAOPERATIVE TRANSESOPHAGEAL ECHOCARDIOGRAM;  Surgeon: Purcell Nails, MD;  Location: [facility] OR;  Service: Open Heart Surgery;  Laterality: N/A;  . MITRAL VALVE REPAIR Right 08/15/2012   Procedure: MINIMALLY INVASIVE MITRAL VALVE REPAIR (MVR);  Surgeon: Purcell Nails, MD;  Location: [facility] OR;  Service: Open Heart Surgery;  Laterality: Right;  . TEE WITHOUT CARDIOVERSION N/A 07/04/2012   Procedure: TRANSESOPHAGEAL ECHOCARDIOGRAM (TEE);  Surgeon: Laurey Morale, MD;  Location: [facility] ENDOSCOPY;  Service: Cardiovascular;  Laterality: N/A;  . TOTAL HIP ARTHROPLASTY  08/24/2011   Procedure: TOTAL HIP ARTHROPLASTY ANTERIOR APPROACH;  Surgeon: Shelda Pal, MD;  Location: [facility] ORS;  Service: Orthopedics;  Laterality: Right;    There were no vitals filed for this visit.  Subjective Assessment - 09/06/17 0808    Subjective  Pt  denied falls since last visit. MD put pt on beta blocker, Coreg and he will start it today.     Pertinent History  HTN, DM with CKD stage 3, OA, mitral valve replacement, DDD, L cataract, CAD, HLD, hx of L DVT and PE, sleep apnea, inguinal hernia repair    Patient Stated Goals  Be more stable when walking and not fall.     Currently in Pain?  Yes    Pain Score  5     Pain Location  Shoulder    Pain Orientation  Left    Pain Descriptors / Indicators  Aching    Pain Type  Chronic pain    Pain Onset  More than a month ago    Pain Frequency  Constant    Aggravating Factors   prolonged positioning after sleeping    Pain Relieving Factors  medication/vitamin           Therex and neuro re-ed:    Access Code: [email or web address]  URL:  [email or web address]  Date: 09/06/2017  Prepared by: Zerita Boers   Exercises  Standing Hip Abduction - 10 reps - 3 sets - 1x daily - 3x weekly  Standing Hip Extension - 10 reps - 3 sets - 1x daily - 3x weekly  Backwards Walking - 4 reps - 1 sets - 1x daily - 7x weekly  (neuro re-ed) Seated Hamstring Stretch - 3 reps - 1 sets - 1x daily - 7x weekly  Supine Figure 4 Piriformis Stretch - 3 reps - 1 sets - 30-45 hold - 1x daily - 7x weekly  Supine Lower Trunk Rotation - 3 reps - 1 sets - 1x daily - 7x weekly                   Self Care: PT Education - 09/06/17 0903    Education Details  PT discussed utilizing a body pillow to reduce shoulder pain and trying to sleep supine with bed slightly elevated. PT provided pt with strengthening, balance, and flexibility HEP.     Person(s) Educated  Patient    Methods  Explanation;Demonstration;Tactile cues;Verbal cues;Handout    Comprehension  Verbalized understanding;Returned demonstration;Need further instruction       PT Short Term Goals - 08/25/17 1158      PT SHORT TERM GOAL #1   Title  Pt will be IND with HEP to improve balance, strength, and flexibility. TARGET DATE FOR ALL STGS: 09/22/17    Status  New      PT SHORT TERM GOAL #2   Title  Pt will amb. 300' over even terrain with LRAD at MOD I level to improve safety during functional mobility.     Status  New      PT SHORT TERM GOAL #3   Title  Pt will report zero falls while on vacation to improve safety.     Status  New      PT SHORT TERM GOAL #4   Title  Pt will perform TUG with LRAD in </=13.5sec. to decr. falls risk.     Status  New      PT SHORT TERM GOAL #5   Title  Pt will improve DGI score to >/=14/24 to decr. falls risk.     Status  New        PT Long Term Goals - 08/25/17 1200      PT LONG TERM GOAL #1   Title  Pt will improve DGI score to >/=20/24 to decr. falls  risk. TARGET DATE FOR ALL LTGS: 10/20/17    Status  New      PT LONG  TERM GOAL #2   Title  Pt will amb. 600' with LRAD over even/uneven terrain with LRAD to improve safety during functional mobility.     Status  New      PT LONG TERM GOAL #3   Title  Pt will report zero falls in the last 4 weeks to improve safety.     Status  New      PT LONG TERM GOAL #4   Title  Pt will amb. 200' over grassy terrain, while carrying a light object to mimic gardening, IND to safely perform yard work.     Status  New            Plan - 09/06/17 0905    Clinical Impression Statement  Today's skilled session focused on establishing strengthening, balance, and flexibility HEP. Pt tolerated HEP well but noted to experience incr. tightness during all activities, which is why PT added flexibility to HEP. Continue with POC.     Rehab Potential  Good    Clinical Impairments Affecting Rehab Potential  see above    PT Frequency  2x / week    PT Duration  8 weeks    PT Treatment/Interventions  ADLs/Self Care Home Management;Biofeedback;Therapeutic activities;Therapeutic exercise;Electrical Stimulation;Functional mobility training;Stair training;Gait training;Patient/family education;Orthotic Fit/Training;DME Instruction;Neuromuscular re-education;Balance training;Vestibular    PT Next Visit Plan  Assess L shoulder and treat as indicated (as pt reported it has been hurting since fall and limits arm swing during gait). Pt reports L SLS causes L knee to buckle after THA.    Consulted and Agree with Plan of Care  Patient       Patient will benefit from skilled therapeutic intervention in order to improve the following deficits and impairments:  Abnormal gait, Decreased endurance, Impaired UE functional use, Decreased knowledge of use of DME, Decreased strength, Decreased balance, Decreased mobility, Decreased range of motion, Impaired flexibility, Postural dysfunction  Visit Diagnosis: Muscle weakness (generalized)  Other abnormalities of gait and mobility  Unsteadiness on  feet     Problem List Patient Active Problem List   Diagnosis Date Noted  . Encounter for therapeutic drug monitoring 08/10/2017  . Encounter for Medicare annual wellness exam 02/23/2016  . Hx of colonic polyps 05/31/2014  . Obesity 04/24/2014  . Medication management 05/24/2013  . DM type 2 causing CKD stage 3 (HCC) 05/24/2013  . Hyperlipidemia   . Hypertension   . Seasonal allergies   . Osteoarthritis   . Vitamin D deficiency   . S/P mitral valve repair 08/15/2012  . Right carotid bruit 06/14/2011  . Long term current use of anticoagulant therapy 11/05/2010    Susie Pousson L 09/06/2017, 9:07 AM  [facility] [address] [place], [place], [address] Phone: [phone]   Fax:  [phone]  Name: [name] MRN: [id number] Date of Birth: 11/05/1945  Zerita Boers, PT,DPT 09/06/17 9:08 AM Phone: [phone] Fax: [phone]

## 2017-09-06 NOTE — Patient Instructions (Signed)
Access Code: [email or web address]  URL: [email or web address]  Date: 09/06/2017  Prepared by: Zerita Boers   Exercises  Standing Hip Abduction - 10 reps - 3 sets - 1x daily - 3x weekly  Standing Hip Extension - 10 reps - 3 sets - 1x daily - 3x weekly  Backwards Walking - 4 reps - 1 sets - 1x daily - 7x weekly  Seated Hamstring Stretch - 3 reps - 1 sets - 1x daily - 7x weekly  Supine Figure 4 Piriformis Stretch - 3 reps - 1 sets - 30-45 hold - 1x daily - 7x weekly  Supine Lower Trunk Rotation - 3 reps - 1 sets - 1x daily - 7x weekly

## 2017-09-09 ENCOUNTER — Ambulatory Visit: Payer: 59 | Admitting: Rehabilitation

## 2017-09-12 ENCOUNTER — Ambulatory Visit: Payer: 59 | Admitting: Rehabilitation

## 2017-09-12 ENCOUNTER — Other Ambulatory Visit: Payer: Self-pay | Admitting: Internal Medicine

## 2017-09-12 ENCOUNTER — Telehealth: Payer: Self-pay | Admitting: Cardiovascular Disease

## 2017-09-12 ENCOUNTER — Encounter: Payer: Self-pay | Admitting: Rehabilitation

## 2017-09-12 DIAGNOSIS — R2689 Other abnormalities of gait and mobility: Secondary | ICD-10-CM | POA: Diagnosis not present

## 2017-09-12 DIAGNOSIS — M6281 Muscle weakness (generalized): Secondary | ICD-10-CM | POA: Diagnosis not present

## 2017-09-12 DIAGNOSIS — R2681 Unsteadiness on feet: Secondary | ICD-10-CM

## 2017-09-12 MED FILL — FENOFIBRATE 134 MG CAPSULE: 134 | 90 days supply | Qty: 90 | Fill #3

## 2017-09-12 MED FILL — BENAZEPRIL HCL 20 MG TABLET: 20 | 90 days supply | Qty: 90 | Fill #0

## 2017-09-12 NOTE — Telephone Encounter (Signed)
I spoke with pt.  He started Coreg on 6/17. This was started based on monitor results.    Three to four days later (6/20-6/21) he started feeling fatigued and sleeping a lot.  Also has chest tightness which started the same time.  Tightness is continuous.  He states he goes to bed with it and wakes up with it.  Has not taken anything for it.  No shortness of breath. Not worse with exertion. Has been going to PT and has tightness there and also at rest. Not feeling any palpitations.  I told pt to hold Coreg tonight and I would send message to Dr. Clifton James for review/recommendations.  I told him he could also hold tomorrow's morning dose until he heard from [place]. I advised him to go to ED if symptoms worsen.

## 2017-09-12 NOTE — Patient Instructions (Signed)
Access Code: [email or web address]  URL: [email or web address]  Date: 09/12/2017  Prepared by: Harriet Butte   Exercises  Standing Hip Abduction - 10 reps - 3 sets - 1x daily - 3x weekly  Standing Hip Extension - 10 reps - 3 sets - 1x daily - 3x weekly  Backwards Walking - 4 reps - 1 sets - 1x daily - 7x weekly  Seated Hamstring Stretch - 3 reps - 1 sets - 1x daily - 7x weekly  Supine Figure 4 Piriformis Stretch - 3 reps - 1 sets - 30-45 hold - 1x daily - 7x weekly  Supine Lower Trunk Rotation - 3 reps - 1 sets - 1x daily - 7x weekly  Wide Stance with Head Rotation on Foam Pad - 10 reps - 1 sets - 2x daily - 7x weekly  Romberg Stance on Foam Pad - 1 reps - 3 sets - 20 hold - 1x daily - 7x weekly

## 2017-09-12 NOTE — Therapy (Signed)
[facility] [address] [place], [place], [address] Phone: [phone]   Fax:  [phone]  Physical Therapy Treatment  Patient Details  Name: [name] MRN: [id number] Date of Birth: 1945/10/04 Referring Provider: Judd Gaudier, NP   Encounter Date: 09/12/2017  PT End of Session - 09/12/17 2028    Visit Number  3    Number of Visits  17    Date for PT Re-Evaluation  10/24/17    Authorization Type  [facility] [employer] and Medicare    PT Start Time  1448    PT Stop Time  1531    PT Time Calculation (min)  43 min    Activity Tolerance  Patient tolerated treatment well    Behavior During Therapy  [facility] for tasks assessed/performed       Past Medical History:  Diagnosis Date  . Allergy    SEASONAL  . Cataract    SMALL IN LEFT EYE  . CKD (chronic kidney disease), stage III (HCC)   . Coronary artery disease    a. Cath 2014 - mild nonobstructive CAD - 40% prox LAD, 30% OM2, otherwise mild irregularities.  . Degenerative joint disease   . Hyperlipidemia   . Hypertension   . Inguinal hernia    right  . Osteoarthritis   . Pre-diabetes   . Pulmonary embolism [facility])    march 2010, 2012              /   DVT x  2  LEFT 2010  . S/P mitral valve repair 08/15/2012   Complex valvuloplasty including triangular resection of posterior leaflet, artificial Gore-tex neocord placement x4 and Sorin Memo 3D ring annuloplasty via right mini thoracotomy approach  . Seasonal allergies   . Severe mitral regurgitation by prior echocardiogram    a. s/p MV repair 2014.  . Sleep apnea    STOP BANG SCORE 4  . Vitamin D deficiency   . Wears glasses     Past Surgical History:  Procedure Laterality Date  . achillis tendon     repair 20 years ago  . CARDIAC CATHETERIZATION    . COLONOSCOPY    . COLONOSCOPY W/ BIOPSIES AND POLYPECTOMY    . HIP ARTHROPLASTY     11/06/07  . INGUINAL HERNIA REPAIR Right 11/07/2015   Procedure: LAPAROSCOPIC RIGHT  INGUINAL HERNIA WITH MESH;  Surgeon: Axel Filler, MD;  Location: [facility] OR;  Service: General;  Laterality: Right;  . INSERTION OF MESH Right 11/07/2015   Procedure: INSERTION OF MESH;  Surgeon: Axel Filler, MD;  Location: [facility] OR;  Service: General;  Laterality: Right;  . INTRAOPERATIVE TRANSESOPHAGEAL ECHOCARDIOGRAM N/A 08/15/2012   Procedure: INTRAOPERATIVE TRANSESOPHAGEAL ECHOCARDIOGRAM;  Surgeon: Purcell Nails, MD;  Location: [facility] OR;  Service: Open Heart Surgery;  Laterality: N/A;  . MITRAL VALVE REPAIR Right 08/15/2012   Procedure: MINIMALLY INVASIVE MITRAL VALVE REPAIR (MVR);  Surgeon: Purcell Nails, MD;  Location: [facility] OR;  Service: Open Heart Surgery;  Laterality: Right;  . TEE WITHOUT CARDIOVERSION N/A 07/04/2012   Procedure: TRANSESOPHAGEAL ECHOCARDIOGRAM (TEE);  Surgeon: Laurey Morale, MD;  Location: [facility] ENDOSCOPY;  Service: Cardiovascular;  Laterality: N/A;  . TOTAL HIP ARTHROPLASTY  08/24/2011   Procedure: TOTAL HIP ARTHROPLASTY ANTERIOR APPROACH;  Surgeon: Shelda Pal, MD;  Location: [facility] ORS;  Service: Orthopedics;  Laterality: Right;    There were no vitals filed for this visit.  Subjective Assessment - 09/12/17 1451    Subjective  Pt  reports that new Beta blocker is making him feel sleepy, foggy in the head, tight in the chest.  Wants to call MD.     Pertinent History  HTN, DM with CKD stage 3, OA, mitral valve replacement, DDD, L cataract, CAD, HLD, hx of L DVT and PE, sleep apnea, inguinal hernia repair    Patient Stated Goals  Be more stable when walking and not fall.     Currently in Pain?  Yes    Pain Score  6     Pain Location  Shoulder    Pain Orientation  Left    Pain Descriptors / Indicators  Aching    Pain Type  Chronic pain    Pain Onset  More than a month ago    Pain Frequency  Intermittent    Aggravating Factors   prolonged positioning after sleeping     Pain Relieving Factors  medication (Tylenol)                       [facility] Adult PT  Treatment/Exercise - 09/12/17 1447      Neuro Re-ed    Neuro Re-ed Details   Performed tandem gait forwards/backwards x 4 reps along outside of // bars for UE support.  Cues to decrease UE support for increased challenge, tandem stance x 2 reps of 20 secs on each side, again with cues for decreased UE support, SLS x 15 secs on each side x 2 sets.  Progressed to // bars standing on foam airex with feet apart EC x 20 secs, feet apart EC w/ head turns up/down and side/side x 10 reps each with intermitent support as needed-added to HEP with education on how to set up in corner at home with pillow.  Feet together EO x 20 secs>feet together EC x 2 sets of 20 secs-added to HEP.  Reviewed backwards gait during session, see pt instruction.       Exercises   Exercises  Other Exercises    Other Exercises   Reviewed current HEP for LE flexibility and strength, see pt instruction.          Access Code: [email or web address]  URL: [email or web address]  Date: 09/12/2017  Prepared by: Harriet Butte   Exercises  Standing Hip Abduction - 10 reps - 3 sets - 1x daily - 3x weekly  Standing Hip Extension - 10 reps - 3 sets - 1x daily - 3x weekly  Backwards Walking - 4 reps - 1 sets - 1x daily - 7x weekly  Seated Hamstring Stretch - 3 reps - 1 sets - 1x daily - 7x weekly  Supine Figure 4 Piriformis Stretch - 3 reps - 1 sets - 30-45 hold - 1x daily - 7x weekly  Supine Lower Trunk Rotation - 3 reps - 1 sets - 1x daily - 7x weekly  Wide Stance with Head Rotation on Foam Pad - 10 reps - 1 sets - 2x daily - 7x weekly  Romberg Stance on Foam Pad - 1 reps - 3 sets - 20 hold - 1x daily - 7x weekly       PT Education - 09/12/17 2028    Education Details  Education to call MD regarding side effects of BP medication, following up with ortho MD about shoulder pain.  additions to HEP.  Also educated on purpose of shoe lift and how leg length descrepancy could impact balance and cause LE/back pain in future.       Person(s) Educated  Patient    Methods  Explanation;Demonstration;Handout    Comprehension  Verbalized understanding;Returned demonstration       PT Short Term Goals - 08/25/17 1158      PT SHORT TERM GOAL #1   Title  Pt will be IND with HEP to improve balance, strength, and flexibility. TARGET DATE FOR ALL STGS: 09/22/17    Status  New      PT SHORT TERM GOAL #2   Title  Pt will amb. 300' over even terrain with LRAD at MOD I level to improve safety during functional mobility.     Status  New      PT SHORT TERM GOAL #3   Title  Pt will report zero falls while on vacation to improve safety.     Status  New      PT SHORT TERM GOAL #4   Title  Pt will perform TUG with LRAD in </=13.5sec. to decr. falls risk.     Status  New      PT SHORT TERM GOAL #5   Title  Pt will improve DGI score to >/=14/24 to decr. falls risk.     Status  New        PT Long Term Goals - 08/25/17 1200      PT LONG TERM GOAL #1   Title  Pt will improve DGI score to >/=20/24 to decr. falls risk. TARGET DATE FOR ALL LTGS: 10/20/17    Status  New      PT LONG TERM GOAL #2   Title  Pt will amb. 600' with LRAD over even/uneven terrain with LRAD to improve safety during functional mobility.     Status  New      PT LONG TERM GOAL #3   Title  Pt will report zero falls in the last 4 weeks to improve safety.     Status  New      PT LONG TERM GOAL #4   Title  Pt will amb. 200' over grassy terrain, while carrying a light object to mimic gardening, IND to safely perform yard work.     Status  New            Plan - 09/12/17 2029    Clinical Impression Statement  Skilled session reviewed current HEP with cues for posture and technique and addition of more balance exercises due to pt going on vacation soon.  Pt with marked difficulty when multi-sensory systems challenged.  Will continue with this during future sessions.     Rehab Potential  Good    Clinical Impairments Affecting Rehab Potential  see above     PT Frequency  2x / week    PT Duration  8 weeks    PT Treatment/Interventions  ADLs/Self Care Home Management;Biofeedback;Therapeutic activities;Therapeutic exercise;Electrical Stimulation;Functional mobility training;Stair training;Gait training;Patient/family education;Orthotic Fit/Training;DME Instruction;Neuromuscular re-education;Balance training;Vestibular    PT Next Visit Plan  Assess L shoulder and treat as indicated (as pt reported it has been hurting since fall and limits arm swing during gait-Rosio Weiss did not address as I didn't feel comfortable treating shoulder, recommended he see ortho MD), Pt reports L SLS causes L knee to buckle after THA.    Recommended Other Services  PT recommended he get order from MD for shoe lift to address leg length descrepancy.      Consulted and Agree with Plan of Care  Patient       Patient will benefit from skilled therapeutic intervention in order to improve the  following deficits and impairments:  Abnormal gait, Decreased endurance, Impaired UE functional use, Decreased knowledge of use of DME, Decreased strength, Decreased balance, Decreased mobility, Decreased range of motion, Impaired flexibility, Postural dysfunction  Visit Diagnosis: Muscle weakness (generalized)  Other abnormalities of gait and mobility  Unsteadiness on feet     Problem List Patient Active Problem List   Diagnosis Date Noted  . Encounter for therapeutic drug monitoring 08/10/2017  . Encounter for Medicare annual wellness exam 02/23/2016  . Hx of colonic polyps 05/31/2014  . Obesity 04/24/2014  . Medication management 05/24/2013  . DM type 2 causing CKD stage 3 (HCC) 05/24/2013  . Hyperlipidemia   . Hypertension   . Seasonal allergies   . Osteoarthritis   . Vitamin D deficiency   . S/P mitral valve repair 08/15/2012  . Right carotid bruit 06/14/2011  . Long term current use of anticoagulant therapy 11/05/2010    Vista Deck 09/12/2017, 8:32 PM  [facility] [address] [place], [place], [address] Phone: [phone]   Fax:  [phone]  Name: [name] MRN: [id number] Date of Birth: [birth year]

## 2017-09-12 NOTE — Telephone Encounter (Signed)
Pt c/o medication issue:  1. Name of Medication: Carvedilol   2. How are you currently taking this medication (dosage and times per day)? 1 tab bid last dose 830am  3. Are you having a reaction (difficulty breathing--STAT)? Chest tightness, sleepiness, fatigue , headache  4. What is your medication issue? Side effects as listed above   [phone] advise

## 2017-09-13 NOTE — Telephone Encounter (Signed)
Patient stated he feels a lot better and that all his symptoms have improved and are almost gone. Patient stated he was able to get a good night sleep last night. Will forward to Dr. Clifton James so he is aware.

## 2017-09-13 NOTE — Telephone Encounter (Signed)
Great.  Thanks

## 2017-09-13 NOTE — Telephone Encounter (Signed)
Can we follow up with him and see if his symptoms have resolved by holding the Coreg? Thanks, chris

## 2017-09-13 NOTE — Telephone Encounter (Signed)
Will forward to Dr. Gibson Ramp nurse, so she can follow up with patient.

## 2017-09-15 ENCOUNTER — Ambulatory Visit: Payer: 59

## 2017-09-15 DIAGNOSIS — R2689 Other abnormalities of gait and mobility: Secondary | ICD-10-CM

## 2017-09-15 DIAGNOSIS — R2681 Unsteadiness on feet: Secondary | ICD-10-CM

## 2017-09-15 DIAGNOSIS — M6281 Muscle weakness (generalized): Secondary | ICD-10-CM | POA: Diagnosis not present

## 2017-09-15 NOTE — Telephone Encounter (Signed)
Left message to call back  

## 2017-09-15 NOTE — Therapy (Signed)
[facility] [address] [place], [place], [address] Phone: [phone]   Fax:  [phone]  Physical Therapy Treatment  Patient Details  Name: [name] MRN: [id number] Date of Birth: 10-Feb-1946 Referring Provider: Judd Gaudier, NP   Encounter Date: 09/15/2017  PT End of Session - 09/15/17 0958    Visit Number  4    Number of Visits  17    Date for PT Re-Evaluation  10/24/17    Authorization Type  [facility] [employer] and Medicare    PT Start Time  [phone] pt late    PT Stop Time  0929    PT Time Calculation (min)  40 min    Equipment Utilized During Treatment  -- min A to S prn    Activity Tolerance  Patient tolerated treatment well    Behavior During Therapy  [facility] for tasks assessed/performed       Past Medical History:  Diagnosis Date  . Allergy    SEASONAL  . Cataract    SMALL IN LEFT EYE  . CKD (chronic kidney disease), stage III (HCC)   . Coronary artery disease    a. Cath 2014 - mild nonobstructive CAD - 40% prox LAD, 30% OM2, otherwise mild irregularities.  . Degenerative joint disease   . Hyperlipidemia   . Hypertension   . Inguinal hernia    right  . Osteoarthritis   . Pre-diabetes   . Pulmonary embolism [facility])    march 2010, 2012              /   DVT x  2  LEFT 2010  . S/P mitral valve repair 08/15/2012   Complex valvuloplasty including triangular resection of posterior leaflet, artificial Gore-tex neocord placement x4 and Sorin Memo 3D ring annuloplasty via right mini thoracotomy approach  . Seasonal allergies   . Severe mitral regurgitation by prior echocardiogram    a. s/p MV repair 2014.  . Sleep apnea    STOP BANG SCORE 4  . Vitamin D deficiency   . Wears glasses     Past Surgical History:  Procedure Laterality Date  . achillis tendon     repair 20 years ago  . CARDIAC CATHETERIZATION    . COLONOSCOPY    . COLONOSCOPY W/ BIOPSIES AND POLYPECTOMY    . HIP ARTHROPLASTY     11/06/07  .  INGUINAL HERNIA REPAIR Right 11/07/2015   Procedure: LAPAROSCOPIC RIGHT INGUINAL HERNIA WITH MESH;  Surgeon: Axel Filler, MD;  [facility] OR;  Service: General;  Laterality: Right;  . INSERTION OF MESH Right 11/07/2015   Procedure: INSERTION OF MESH;  Surgeon: Axel Filler, MD;  Location: [facility] OR;  Service: General;  Laterality: Right;  . INTRAOPERATIVE TRANSESOPHAGEAL ECHOCARDIOGRAM N/A 08/15/2012   Procedure: INTRAOPERATIVE TRANSESOPHAGEAL ECHOCARDIOGRAM;  Surgeon: Purcell Nails, MD;  Location: [facility] OR;  Service: Open Heart Surgery;  Laterality: N/A;  . MITRAL VALVE REPAIR Right 08/15/2012   Procedure: MINIMALLY INVASIVE MITRAL VALVE REPAIR (MVR);  Surgeon: Purcell Nails, MD;  Location: [facility] OR;  Service: Open Heart Surgery;  Laterality: Right;  . TEE WITHOUT CARDIOVERSION N/A 07/04/2012   Procedure: TRANSESOPHAGEAL ECHOCARDIOGRAM (TEE);  Surgeon: Laurey Morale, MD;  Location: [facility] ENDOSCOPY;  Service: Cardiovascular;  Laterality: N/A;  . TOTAL HIP ARTHROPLASTY  08/24/2011   Procedure: TOTAL HIP ARTHROPLASTY ANTERIOR APPROACH;  Surgeon: Shelda Pal, MD;  Location: [facility] ORS;  Service: Orthopedics;  Laterality: Right;    There were no vitals  filed for this visit.  Subjective Assessment - 09/15/17 0852    Subjective  Pt reported MD told pt to stop new Beta blocker and he feel s better. Pt denied falls since last visit. Pt is going to wait until after he gets back from vacation to see orthopedic MD about L shoulder pain.     Pertinent History  HTN, DM with CKD stage 3, OA, mitral valve replacement, DDD, L cataract, CAD, HLD, hx of L DVT and PE, sleep apnea, inguinal hernia repair    Patient Stated Goals  Be more stable when walking and not fall.     Currently in Pain?  No/denies                       [facility] Adult PT Treatment/Exercise - 09/15/17 0859      Ambulation/Gait   Ambulation/Gait  Yes    Ambulation/Gait Assistance  5: Supervision    Ambulation/Gait Assistance  Details  S to ensure safety, especially over grassy terrain. PT trialed 7mm and 9mm shoe insert in L shoe in order to improve leg length discrepency.    Ambulation Distance (Feet)  300 Feet outdoors and 230'x2,30'x2 indoors with shoe insert    Assistive device  None    Gait Pattern  Step-through pattern;Decreased stride length;Decreased weight shift to left;Decreased dorsiflexion - left;Decreased dorsiflexion - right;Antalgic;Trunk flexed;Lateral trunk lean to right;Decreased arm swing - left    Ambulation Surface  Level;Indoor;Unlevel;Outdoor;Paved;Grass          Balance Exercises - 09/15/17 0944      Balance Exercises: Standing   Other Standing Exercises  Performed in //bars with 0-2 UE support and min A to S for safety: pt performed tandem gait with 7mm shoe lift 4x10' and cone taps single and double taps with BLE and knock cone down and back upright, 4x5cone/LE. Cues to improve lateral weight shifting and cues for technique.          PT Short Term Goals - 08/25/17 1158      PT SHORT TERM GOAL #1   Title  Pt will be IND with HEP to improve balance, strength, and flexibility. TARGET DATE FOR ALL STGS: 09/22/17    Status  New      PT SHORT TERM GOAL #2   Title  Pt will amb. 300' over even terrain with LRAD at MOD I level to improve safety during functional mobility.     Status  New      PT SHORT TERM GOAL #3   Title  Pt will report zero falls while on vacation to improve safety.     Status  New      PT SHORT TERM GOAL #4   Title  Pt will perform TUG with LRAD in </=13.5sec. to decr. falls risk.     Status  New      PT SHORT TERM GOAL #5   Title  Pt will improve DGI score to >/=14/24 to decr. falls risk.     Status  New        PT Long Term Goals - 08/25/17 1200      PT LONG TERM GOAL #1   Title  Pt will improve DGI score to >/=20/24 to decr. falls risk. TARGET DATE FOR ALL LTGS: 10/20/17    Status  New      PT LONG TERM GOAL #2   Title  Pt will amb. 600' with LRAD  over even/uneven terrain with LRAD to  improve safety during functional mobility.     Status  New      PT LONG TERM GOAL #3   Title  Pt will report zero falls in the last 4 weeks to improve safety.     Status  New      PT LONG TERM GOAL #4   Title  Pt will amb. 200' over grassy terrain, while carrying a light object to mimic gardening, IND to safely perform yard work.     Status  New            Plan - 09/15/17 1021    Clinical Impression Statement  Pt demonstrated improvement in gait deviations with 49mm and especially 50mm shoe inserts. However, 3mm was too big to fit comfortably in shoe and pt may benefit from L shoe lift instead. Pt demonstrated improved SLS balance with L shoe insert but did require cuea and up to min A to maintain balance without UE support during SLS activities. Pt would continue to benefit from skilled PT to improve safety during functional mobility.     Rehab Potential  Good    Clinical Impairments Affecting Rehab Potential  see above    PT Frequency  2x / week    PT Duration  8 weeks    PT Treatment/Interventions  ADLs/Self Care Home Management;Biofeedback;Therapeutic activities;Therapeutic exercise;Electrical Stimulation;Functional mobility training;Stair training;Gait training;Patient/family education;Orthotic Fit/Training;DME Instruction;Neuromuscular re-education;Balance training;Vestibular    PT Next Visit Plan  L shoulder pain: recommended he see ortho MD), Pt reports L SLS causes L knee to buckle after THA. Check STGs.    Consulted and Agree with Plan of Care  Patient       Patient will benefit from skilled therapeutic intervention in order to improve the following deficits and impairments:  Abnormal gait, Decreased endurance, Impaired UE functional use, Decreased knowledge of use of DME, Decreased strength, Decreased balance, Decreased mobility, Decreased range of motion, Impaired flexibility, Postural dysfunction  Visit Diagnosis: Other abnormalities  of gait and mobility  Muscle weakness (generalized)  Unsteadiness on feet     Problem List Patient Active Problem List   Diagnosis Date Noted  . Encounter for therapeutic drug monitoring 08/10/2017  . Encounter for Medicare annual wellness exam 02/23/2016  . Hx of colonic polyps 05/31/2014  . Obesity 04/24/2014  . Medication management 05/24/2013  . DM type 2 causing CKD stage 3 (HCC) 05/24/2013  . Hyperlipidemia   . Hypertension   . Seasonal allergies   . Osteoarthritis   . Vitamin D deficiency   . S/P mitral valve repair 08/15/2012  . Right carotid bruit 06/14/2011  . Long term current use of anticoagulant therapy 11/05/2010    Ramani Riva L 09/15/2017, 10:24 AM  [facility] [address] [place], [place], [address] Phone: [phone]   Fax:  [phone]  Name: HEITOR GWYNN MRN: [id number] Date of Birth: [birth year]  Zerita Boers, PT,DPT 09/15/17 10:24 AM Phone: [phone] Fax: [phone]

## 2017-09-15 NOTE — Telephone Encounter (Signed)
I spoke with pt. He reports he is feeling better so will stay off Coreg.

## 2017-09-15 NOTE — Telephone Encounter (Signed)
Follow up ° °Pt returning call for nurse °

## 2017-09-19 ENCOUNTER — Ambulatory Visit: Payer: Self-pay | Admitting: Adult Health

## 2017-09-27 ENCOUNTER — Ambulatory Visit (INDEPENDENT_AMBULATORY_CARE_PROVIDER_SITE_OTHER): Payer: 59 | Admitting: *Deleted

## 2017-09-27 ENCOUNTER — Ambulatory Visit: Payer: 59 | Attending: Adult Health

## 2017-09-27 DIAGNOSIS — M6281 Muscle weakness (generalized): Secondary | ICD-10-CM | POA: Diagnosis not present

## 2017-09-27 DIAGNOSIS — Z5181 Encounter for therapeutic drug level monitoring: Secondary | ICD-10-CM

## 2017-09-27 DIAGNOSIS — R2681 Unsteadiness on feet: Secondary | ICD-10-CM | POA: Diagnosis not present

## 2017-09-27 DIAGNOSIS — Z7901 Long term (current) use of anticoagulants: Secondary | ICD-10-CM

## 2017-09-27 DIAGNOSIS — Z9889 Other specified postprocedural states: Secondary | ICD-10-CM | POA: Diagnosis not present

## 2017-09-27 DIAGNOSIS — R2689 Other abnormalities of gait and mobility: Secondary | ICD-10-CM

## 2017-09-27 DIAGNOSIS — I2699 Other pulmonary embolism without acute cor pulmonale: Secondary | ICD-10-CM

## 2017-09-27 LAB — POCT INR: INR: 4.2 — AB (ref 2.0–3.0)

## 2017-09-27 NOTE — Therapy (Signed)
[facility] [address] [place], [place], [address] Phone: [phone]   Fax:  [phone]  Physical Therapy Treatment  Patient Details  Name: [name] MRN: [id number] Date of Birth: Jun 07, 1945 Referring Provider: Liane Comber, NP   Encounter Date: 09/27/2017  PT End of Session - 09/27/17 0926    Visit Number  5    Number of Visits  17    Date for PT Re-Evaluation  10/24/17    Authorization Type  [facility] and Medicare    PT Start Time  [phone]    PT Stop Time  0929    PT Time Calculation (min)  40 min    Equipment Utilized During Treatment  -- S prn    Activity Tolerance  Patient tolerated treatment well    Behavior During Therapy  [facility] for tasks assessed/performed       Past Medical History:  Diagnosis Date  . Allergy    SEASONAL  . Cataract    SMALL IN LEFT EYE  . CKD (chronic kidney disease), stage III ([place])   . Coronary artery disease    a. Cath 2014 - mild nonobstructive CAD - 40% prox LAD, 30% OM2, otherwise mild irregularities.  . Degenerative joint disease   . Hyperlipidemia   . Hypertension   . Inguinal hernia    right  . Osteoarthritis   . Pre-diabetes   . Pulmonary embolism [facility])    march 2010, 2012              /   DVT x  2  LEFT 2010  . S/P mitral valve repair 08/15/2012   Complex valvuloplasty including triangular resection of posterior leaflet, artificial Gore-tex neocord placement x4 and Sorin Memo 3D ring annuloplasty via right mini thoracotomy approach  . Seasonal allergies   . Severe mitral regurgitation by prior echocardiogram    a. s/p MV repair 2014.  . Sleep apnea    STOP BANG SCORE 4  . Vitamin D deficiency   . Wears glasses     Past Surgical History:  Procedure Laterality Date  . achillis tendon     repair 20 years ago  . CARDIAC CATHETERIZATION    . COLONOSCOPY    . COLONOSCOPY W/ BIOPSIES AND POLYPECTOMY    . HIP ARTHROPLASTY     11/06/07  . INGUINAL HERNIA REPAIR  Right 11/07/2015   Procedure: LAPAROSCOPIC RIGHT INGUINAL HERNIA WITH MESH;  Surgeon: Ralene Ok, MD;  Location: [place];  Service: General;  Laterality: Right;  . INSERTION OF MESH Right 11/07/2015   Procedure: INSERTION OF MESH;  Surgeon: Ralene Ok, MD;  Location: [place];  Service: General;  Laterality: Right;  . INTRAOPERATIVE TRANSESOPHAGEAL ECHOCARDIOGRAM N/A 08/15/2012   Procedure: INTRAOPERATIVE TRANSESOPHAGEAL ECHOCARDIOGRAM;  Surgeon: Rexene Alberts, MD;  Location: [place];  Service: Open Heart Surgery;  Laterality: N/A;  . MITRAL VALVE REPAIR Right 08/15/2012   Procedure: MINIMALLY INVASIVE MITRAL VALVE REPAIR (MVR);  Surgeon: Rexene Alberts, MD;  Location: [place];  Service: Open Heart Surgery;  Laterality: Right;  . TEE WITHOUT CARDIOVERSION N/A 07/04/2012   Procedure: TRANSESOPHAGEAL ECHOCARDIOGRAM (TEE);  Surgeon: Larey Dresser, MD;  Location: [place];  Service: Cardiovascular;  Laterality: N/A;  . TOTAL HIP ARTHROPLASTY  08/24/2011   Procedure: TOTAL HIP ARTHROPLASTY ANTERIOR APPROACH;  Surgeon: Mauri Pole, MD;  Location: [facility]S;  Service: Orthopedics;  Laterality: Right;    There were no vitals filed for this visit.  Subjective Assessment - 09/27/17 0852    Subjective  Pt just got back from a cruise and was able to traverse steps and sand without problems. Pt was a bit sore in the legs when he got home.     Pertinent History  HTN, DM with CKD stage 3, OA, mitral valve replacement, DDD, L cataract, CAD, HLD, hx of L DVT and PE, sleep apnea, inguinal hernia repair    Patient Stated Goals  Be more stable when walking and not fall.     Currently in Pain?  Yes    Pain Score  3  "pain in my joints everywhere"    Pain Location  -- all over    Pain Orientation  -- all over    Pain Descriptors / Indicators  Dull;Aching    Pain Type  Chronic pain    Pain Onset  More than a month ago    Pain Frequency  Intermittent    Aggravating Factors   sitting on the plane for prolonged  periods of time and then walking longer distances.     Pain Relieving Factors  Tylenol and cream               Therex: Access Code: [email or web address]  URL: [email or web address]  Date: 09/12/2017   Exercises   Seated Hamstring Stretch - 3 reps - 1 sets - 1x daily - 7x weekly   Supine Figure 4 Piriformis Stretch - 3 reps - 1 sets - 30-45 hold - 1x daily - 7x weekly   Lower trunk rotation stretch: supine with 30 sec. Hold, BLE, 3 reps, dailly.         [facility] Adult PT Treatment/Exercise - 09/27/17 0856      Ambulation/Gait   Ambulation/Gait  Yes    Ambulation/Gait Assistance  6: Modified independent (Device/Increase time);5: Supervision    Ambulation/Gait Assistance Details  Pt amb. over even terrain at MOD I level due to decr. speed, no shoe lift placed today and pt's lateral trunk incr. Then amb. 345' with SPC, cues for sequencing.     Ambulation Distance (Feet)  345 Feet no AD and 345' with SPC    Assistive device  None    Gait Pattern  Step-through pattern;Decreased stride length;Decreased weight shift to left;Decreased dorsiflexion - left;Decreased dorsiflexion - right;Antalgic;Trunk flexed;Lateral trunk lean to right;Decreased arm swing - left    Ambulation Surface  Level      Standardized Balance Assessment   Standardized Balance Assessment  Dynamic Gait Index;Timed Up and Go Test      Dynamic Gait Index   Level Surface  Mild Impairment    Change in Gait Speed  Mild Impairment    Gait with Horizontal Head Turns  Mild Impairment    Gait with Vertical Head Turns  Mild Impairment    Gait and Pivot Turn  Normal    Step Over Obstacle  Mild Impairment    Step Around Obstacles  Normal    Steps  Mild Impairment    Total Score  18      Timed Up and Go Test   TUG  Normal TUG    Normal TUG (seconds)  11.16 and 13.52 sec. no LOB             PT Education - 09/27/17 0926    Education Details  PT discussed goal progress and outcome measure results. PT  added several more visits. PT reviewed HEP and provided pt with cues.     Person(s) Educated  Patient    Methods  Explanation    Comprehension  Verbalized understanding       PT Short Term Goals - 09/27/17 0930      PT SHORT TERM GOAL #1   Title  Pt will be IND with HEP to improve balance, strength, and flexibility. TARGET DATE FOR ALL STGS: 09/22/17    Status  Partially Met      PT SHORT TERM GOAL #2   Title  Pt will amb. 300' over even terrain with LRAD at MOD I level to improve safety during functional mobility.     Status  Achieved      PT SHORT TERM GOAL #3   Title  Pt will report zero falls while on vacation to improve safety.     Status  Achieved      PT SHORT TERM GOAL #4   Title  Pt will perform TUG with LRAD in </=13.5sec. to decr. falls risk.     Status  Achieved      PT SHORT TERM GOAL #5   Title  Pt will improve DGI score to >/=14/24 to decr. falls risk.     Status  Achieved        PT Long Term Goals - 08/25/17 1200      PT LONG TERM GOAL #1   Title  Pt will improve DGI score to >/=20/24 to decr. falls risk. TARGET DATE FOR ALL LTGS: 10/20/17    Status  New      PT LONG TERM GOAL #2   Title  Pt will amb. 600' with LRAD over even/uneven terrain with LRAD to improve safety during functional mobility.     Status  New      PT LONG TERM GOAL #3   Title  Pt will report zero falls in the last 4 weeks to improve safety.     Status  New      PT LONG TERM GOAL #4   Title  Pt will amb. 200' over grassy terrain, while carrying a light object to mimic gardening, IND to safely perform yard work.     Status  New            Plan - 09/27/17 2458    Clinical Impression Statement  Pt demonstrated progress as he met STGs 2-5. Pt partially met STG 1 (HEP), and PT will continue assess HEP next session and progress as tolerated. Pt would benefit from L shoe insert to improve gait deviations (LLD). continue with POC.     Rehab Potential  Good    Clinical Impairments  Affecting Rehab Potential  see above    PT Frequency  2x / week    PT Duration  8 weeks    PT Treatment/Interventions  ADLs/Self Care Home Management;Biofeedback;Therapeutic activities;Therapeutic exercise;Electrical Stimulation;Functional mobility training;Stair training;Gait training;Patient/family education;Orthotic Fit/Training;DME Instruction;Neuromuscular re-education;Balance training;Vestibular    PT Next Visit Plan  Assess remaining HEP and progress prn. L shoulder pain: recommended he see ortho MD), Pt reports L SLS causes L knee to buckle after THA. Check on L shoe insert Rx.     Consulted and Agree with Plan of Care  Patient       Patient will benefit from skilled therapeutic intervention in order to improve the following deficits and impairments:  Abnormal gait, Decreased endurance, Impaired UE functional use, Decreased knowledge of use of DME, Decreased strength, Decreased balance, Decreased mobility, Decreased range of motion, Impaired flexibility, Postural dysfunction  Visit Diagnosis: Other abnormalities of gait  and mobility  Muscle weakness (generalized)  Unsteadiness on feet     Problem List Patient Active Problem List   Diagnosis Date Noted  . Encounter for therapeutic drug monitoring 08/10/2017  . Encounter for Medicare annual wellness exam 02/23/2016  . Hx of colonic polyps 05/31/2014  . Obesity 04/24/2014  . Medication management 05/24/2013  . DM type 2 causing CKD stage 3 ([address]) 05/24/2013  . Hyperlipidemia   . Hypertension   . Seasonal allergies   . Osteoarthritis   . Vitamin D deficiency   . S/P mitral valve repair 08/15/2012  . Right carotid bruit 06/14/2011  . Long term current use of anticoagulant therapy 11/05/2010    Kaylen Motl L 09/27/2017, 9:31 AM  [facility] [address] [place], [place], [address] Phone: [phone]   Fax:  [phone]  Name: [name] MRN:  [id number] Date of Birth: [birth year]  Geoffry Paradise, PT,DPT 09/27/17 9:31 AM Phone: [phone] Fax: [phone]

## 2017-09-27 NOTE — Patient Instructions (Signed)
Description   Skip today, tomorrow only take 1 tablet, then Continue taking  same dosage 1 tablet daily except 2 tablets on Mondays, Wednesdays and Fridays.  Recheck in 2 weeks. Please call with any medication changes or if scheduled for any procedures [facility]# [phone]. Main # [phone].

## 2017-09-27 NOTE — Patient Instructions (Addendum)
Access Code: [email or web address]  URL: [email or web address]  Date: 09/12/2017  Prepared by: Harriet Butte   Exercises   Seated Hamstring Stretch - 3 reps - 1 sets - 1x daily - 7x weekly   Supine Figure 4 Piriformis Stretch - 3 reps - 1 sets - 30-45 hold - 1x daily - 7x weekly   Lower trunk rotation stretch: supine with 30 sec. Hold, BLE, 3 reps, dailly.

## 2017-10-02 NOTE — Progress Notes (Signed)
FOLLOW UP  Assessment and Plan:   Hypertension Well controlled with current medications  Monitor blood pressure at home; patient to call if consistently greater than 130/80 Continue DASH diet.   Reminder to go to the ER if any CP, SOB, nausea, dizziness, severe HA, changes vision/speech, left arm numbness and tingling and jaw pain.  Cholesterol Currently above goal; statin intolerant; continue zetia/fenofibrate, diet emphasized Continue low cholesterol diet and exercise.  Check lipid panel.   Diabetes with diabetic chronic kidney disease Continue medication: lifestyle  Continue diet and exercise.  Perform daily foot/skin check, notify office of any concerning changes.  Check A1C  Overweight Long discussion about weight loss, diet, and exercise Recommended diet heavy in fruits and veggies and low in animal meats, cheeses, and dairy products, appropriate calorie intake Discussed ideal weight for height  Will follow up in 3 months  Vitamin D Def Below goal at last visit; continue supplementation to maintain goal of 70-100 Defer Vit D level  Continue diet and meds as discussed. Further disposition pending results of labs. Discussed med's effects and SE's.   Over 30 minutes of exam, counseling, chart review, and critical decision making was performed.   Future Appointments  Date Time Provider Department Center  10/04/2017  8:45 AM Sherren Kerns, PT [facility]  10/06/2017  8:45 AM Sherren Kerns, PT [facility]  10/10/2017 11:00 AM Marya Amsler, Lyman Speller, PT [facility]  10/11/2017  8:15 AM [facility]  10/13/2017 10:15 AM Parcell, Lyman Speller, PT [facility]  10/20/2017  8:00 AM Sherren Kerns, PT [facility]  10/21/2017  8:00 AM Sherren Kerns, PT [facility]  10/25/2017  8:00 AM Sherren Kerns, PT [facility]  10/27/2017  8:00 AM Sherren Kerns, PT [facility]  01/05/2018 10:00 AM Lucky Cowboy, MD  [facility] None  02/20/2018  8:00 AM Kathleene Hazel, MD [facility]    ----------------------------------------------------------------------------------------------------------------------  HPI 72 y.o. male  presents for 3 month follow up on hypertension, cholesterol, diabetes, obesity and vitamin D deficiency. Patient has hx/o PE in 2010 and 2012 and has been on Coumadin since 2012 followed at [facility].  BMI is Body mass index is 29.43 kg/m., he has been working on diet and exercise. Going to PT for stamina twice a week.  Wt Readings from Last 3 Encounters:  10/03/17 220 lb (99.8 kg)  08/16/17 221 lb (100.2 kg)  07/28/17 222 lb 12.8 oz (101.1 kg)   His blood pressure has been controlled at home, today their BP is BP: 134/82  He does workout. He denies chest pain, shortness of breath, dizziness.   He is on cholesterol medication (zetia, fenofibrate due to statin intolerance) and denies myalgias. His cholesterol is not at goal. The cholesterol last visit was:   Lab Results  Component Value Date   CHOL 171 05/24/2017   HDL 56 05/24/2017   LDLCALC 100 (H) 05/24/2017   TRIG 65 05/24/2017   CHOLHDL 3.1 05/24/2017    He has been working on diet and exercise for T2 diabetes (currently borderline and treated by lifestyle only), and denies foot ulcerations, increased appetite, nausea, paresthesia of the feet, polydipsia, polyuria, vomiting and weight loss. He does report blurry vision and will be seeing ophthalmology. Last A1C in the office was:  Lab Results  Component Value Date   HGBA1C 6.5 (H) 05/24/2017   Patient is on Vitamin D supplement.   Lab Results  Component Value Date  VD25OH 50 05/24/2017        Current Medications:  Current Outpatient Medications on File Prior to Visit  Medication Sig  . acetaminophen (TYLENOL) 500 MG tablet Take 500 mg by mouth every 6 (six) hours as needed for pain.  Marland Kitchen amoxicillin (AMOXIL) 500 MG capsule  Take 1 capsule (500 mg total) by mouth 3 (three) times daily. (Patient taking differently: Take 500 mg by mouth 3 (three) times daily. Take when going to the dentist)  . benazepril (LOTENSIN) 20 MG tablet TAKE 1 TABLET BY MOUTH ONCE DAILY  . Cholecalciferol (VITAMIN D PO) Take by mouth daily. TAKING 5,000 UNITS DAILY  . CINNAMON PO Takes 2 tablets (2000mg  ) daily  . ezetimibe (ZETIA) 10 MG tablet TAKE 1 TABLET BY MOUTH DAILY FOR CHOLESTEROL  . fenofibrate micronized (LOFIBRA) 134 MG capsule TAKE 1 CAPSULE BY MOUTH EVERY EVENING.  Marland Kitchen loratadine (CLARITIN) 10 MG tablet Take 10 mg by mouth daily as needed for allergies.  . mometasone (NASONEX) 50 MCG/ACT nasal spray Place 2 sprays into the nose daily as needed (allergies).   . tamsulosin (FLOMAX) 0.4 MG CAPS capsule Take 1 capsule at bedtime for prostate  . warfarin (COUMADIN) 5 MG tablet TAKE AS DIRECTED BY COUMADIN CLINIC. (Patient taking differently: Take 5mg  on Saturdays, Sundays, Tuesdays & Thursdays, and 10mg  on Mondays, Wednesdays & Fridays)  . chlorhexidine (PERIDEX) 0.12 % solution Use as directed 15 mLs in the mouth or throat 2 (two) times daily. (Patient not taking: Reported on 10/03/2017)  . cyclobenzaprine (FLEXERIL) 10 MG tablet Take 1 tablet (10 mg total) by mouth 2 (two) times daily as needed for muscle spasms. (Patient not taking: Reported on 10/03/2017)  . Psyllium ([employer] NATURAL DAILY FIBER PO) Take 1 tablet by mouth daily.   No current facility-administered medications on file prior to visit.      Allergies:  Allergies  Allergen Reactions  . Statins     REACTION: muscle aches  . Welchol [Colesevelam Hcl]     unknown     Medical History:  Past Medical History:  Diagnosis Date  . Allergy    SEASONAL  . Cataract    SMALL IN LEFT EYE  . CKD (chronic kidney disease), stage III (HCC)   . Coronary artery disease    a. Cath 2014 - mild nonobstructive CAD - 40% prox LAD, 30% OM2, otherwise mild irregularities.  . Degenerative  joint disease   . Hyperlipidemia   . Hypertension   . Inguinal hernia    right  . Osteoarthritis   . Pre-diabetes   . Pulmonary embolism [facility])    march 2010, 2012              /   DVT x  2  LEFT 2010  . S/P mitral valve repair 08/15/2012   Complex valvuloplasty including triangular resection of posterior leaflet, artificial Gore-tex neocord placement x4 and Sorin Memo 3D ring annuloplasty via right mini thoracotomy approach  . Seasonal allergies   . Severe mitral regurgitation by prior echocardiogram    a. s/p MV repair 2014.  . Sleep apnea    STOP BANG SCORE 4  . Vitamin D deficiency   . Wears glasses    Family history- Reviewed and unchanged Social history- Reviewed and unchanged   Review of Systems:  Review of Systems  Constitutional: Negative for malaise/fatigue and weight loss.  HENT: Negative for hearing loss and tinnitus.   Eyes: Negative for blurred vision and double vision.  Respiratory: Negative  for cough, shortness of breath and wheezing.   Cardiovascular: Negative for chest pain, palpitations, orthopnea, claudication and leg swelling.  Gastrointestinal: Negative for abdominal pain, blood in stool, constipation, diarrhea, heartburn, melena, nausea and vomiting.  Genitourinary: Negative.   Musculoskeletal: Negative for joint pain and myalgias.  Skin: Negative for rash.  Neurological: Negative for dizziness, tingling, sensory change, weakness and headaches.  Endo/Heme/Allergies: Negative for polydipsia.  Psychiatric/Behavioral: Negative.   All other systems reviewed and are negative.     Physical Exam: BP 134/82   Pulse (!) 54   Temp 98.1 F (36.7 C)   Ht 6' 0.5" (1.842 m)   Wt 220 lb (99.8 kg)   SpO2 97%   BMI 29.43 kg/m  Wt Readings from Last 3 Encounters:  10/03/17 220 lb (99.8 kg)  08/16/17 221 lb (100.2 kg)  07/28/17 222 lb 12.8 oz (101.1 kg)   General Appearance: Well nourished, in no apparent distress. Eyes: PERRLA, EOMs, conjunctiva no  swelling or erythema Sinuses: No Frontal/maxillary tenderness ENT/Mouth: Ext aud canals clear, TMs without erythema, bulging. No erythema, swelling, or exudate on post pharynx.  Tonsils not swollen or erythematous. Hearing normal.  Neck: Supple, thyroid normal.  Respiratory: Respiratory effort normal, BS equal bilaterally without rales, rhonchi, wheezing or stridor.  Cardio: Irregularly irregular. Brisk peripheral pulses without edema.  Abdomen: Soft, + BS.  Non tender, no guarding, rebound, hernias, masses. Lymphatics: Non tender without lymphadenopathy.  Musculoskeletal: Full ROM, 5/5 strength, Slow antalgic gait Skin: Warm, dry without rashes, lesions, ecchymosis.  Neuro: Cranial nerves intact. No cerebellar symptoms.  Psych: Awake and oriented X 3, normal affect, Insight and Judgment appropriate.    Dan Maker, NP 8:55 AM [facility]

## 2017-10-03 ENCOUNTER — Ambulatory Visit (INDEPENDENT_AMBULATORY_CARE_PROVIDER_SITE_OTHER): Payer: 59 | Admitting: Adult Health

## 2017-10-03 ENCOUNTER — Encounter: Payer: Self-pay | Admitting: Adult Health

## 2017-10-03 VITALS — BP 134/82 | HR 54 | Temp 98.1°F | Ht 72.5 in | Wt 220.0 lb

## 2017-10-03 DIAGNOSIS — E782 Mixed hyperlipidemia: Secondary | ICD-10-CM | POA: Diagnosis not present

## 2017-10-03 DIAGNOSIS — I2510 Atherosclerotic heart disease of native coronary artery without angina pectoris: Secondary | ICD-10-CM | POA: Diagnosis not present

## 2017-10-03 DIAGNOSIS — E6609 Other obesity due to excess calories: Secondary | ICD-10-CM

## 2017-10-03 DIAGNOSIS — Z6830 Body mass index (BMI) 30.0-30.9, adult: Secondary | ICD-10-CM | POA: Diagnosis not present

## 2017-10-03 DIAGNOSIS — Z79899 Other long term (current) drug therapy: Secondary | ICD-10-CM | POA: Diagnosis not present

## 2017-10-03 DIAGNOSIS — I10 Essential (primary) hypertension: Secondary | ICD-10-CM

## 2017-10-03 DIAGNOSIS — E1122 Type 2 diabetes mellitus with diabetic chronic kidney disease: Secondary | ICD-10-CM | POA: Diagnosis not present

## 2017-10-03 DIAGNOSIS — N183 Chronic kidney disease, stage 3 (moderate): Secondary | ICD-10-CM | POA: Diagnosis not present

## 2017-10-03 DIAGNOSIS — E559 Vitamin D deficiency, unspecified: Secondary | ICD-10-CM | POA: Diagnosis not present

## 2017-10-03 NOTE — Patient Instructions (Signed)
Aim for 7+ servings of fruits and vegetables daily  80+ fluid ounces of water or unsweet tea for healthy kidneys  Limit animal fats in diet for cholesterol and heart health - choose grass fed whenever available  Aim for low stress - take time to unwind and care for your mental health  Aim for 150 min of moderate intensity exercise weekly for heart health, and weights twice weekly for bone health  Aim for 7-9 hours of sleep daily      When it comes to diets, agreement about the perfect plan isn't easy to find, even among the experts. Experts at the [employer] developed an idea known as the Healthy Eating Plate. Just imagine a plate divided into logical, healthy portions.  The emphasis is on diet quality:  Load up on vegetables and fruits - one-half of your plate: Aim for color and variety, and remember that potatoes don't count.  Go for whole grains - one-quarter of your plate: Whole wheat, barley, wheat berries, quinoa, oats, brown rice, and foods made with them. If you want pasta, go with whole wheat pasta.  Protein power - one-quarter of your plate: Fish, chicken, beans, and nuts are all healthy, versatile protein sources. Limit red meat.  The diet, however, does go beyond the plate, offering a few other suggestions.  Use healthy plant oils, such as olive, canola, soy, corn, sunflower and peanut. Check the labels, and avoid partially hydrogenated oil, which have unhealthy trans fats.  If you're thirsty, drink water. Coffee and tea are good in moderation, but skip sugary drinks and limit milk and dairy products to one or two daily servings.  The type of carbohydrate in the diet is more important than the amount. Some sources of carbohydrates, such as vegetables, fruits, whole grains, and beans-are healthier than others.  Finally, stay active.   

## 2017-10-04 ENCOUNTER — Ambulatory Visit: Payer: 59

## 2017-10-04 DIAGNOSIS — R2681 Unsteadiness on feet: Secondary | ICD-10-CM | POA: Diagnosis not present

## 2017-10-04 DIAGNOSIS — M6281 Muscle weakness (generalized): Secondary | ICD-10-CM | POA: Diagnosis not present

## 2017-10-04 DIAGNOSIS — R2689 Other abnormalities of gait and mobility: Secondary | ICD-10-CM | POA: Diagnosis not present

## 2017-10-04 LAB — CBC WITH DIFFERENTIAL/PLATELET
BASOS ABS: 41 {cells}/uL (ref 0–200)
Basophils Relative: 1.1 %
EOS PCT: 2.5 %
Eosinophils Absolute: 93 cells/uL (ref 15–500)
HEMATOCRIT: 44.5 % (ref 38.5–50.0)
HEMOGLOBIN: 15.3 g/dL (ref 13.2–17.1)
LYMPHS ABS: 1487 {cells}/uL (ref 850–3900)
MCH: 30.4 pg (ref 27.0–33.0)
MCHC: 34.4 g/dL (ref 32.0–36.0)
MCV: 88.3 fL (ref 80.0–100.0)
MPV: 11.6 fL (ref 7.5–12.5)
Monocytes Relative: 7.1 %
Neutro Abs: 1817 cells/uL (ref 1500–7800)
Neutrophils Relative %: 49.1 %
Platelets: 240 10*3/uL (ref 140–400)
RBC: 5.04 10*6/uL (ref 4.20–5.80)
RDW: 13.1 % (ref 11.0–15.0)
Total Lymphocyte: 40.2 %
WBC: 3.7 10*3/uL — ABNORMAL LOW (ref 3.8–10.8)
WBCMIX: 263 {cells}/uL (ref 200–950)

## 2017-10-04 LAB — COMPLETE METABOLIC PANEL WITH GFR
AG Ratio: 1.8 (calc) (ref 1.0–2.5)
ALKALINE PHOSPHATASE (APISO): 43 U/L (ref 40–115)
ALT: 17 U/L (ref 9–46)
AST: 23 U/L (ref 10–35)
Albumin: 4.5 g/dL (ref 3.6–5.1)
BUN/Creatinine Ratio: 13 (calc) (ref 6–22)
BUN: 19 mg/dL (ref 7–25)
CHLORIDE: 105 mmol/L (ref 98–110)
CO2: 27 mmol/L (ref 20–32)
CREATININE: 1.5 mg/dL — AB (ref 0.70–1.18)
Calcium: 9.8 mg/dL (ref 8.6–10.3)
GFR, Est African American: 54 mL/min/{1.73_m2} — ABNORMAL LOW (ref >=60)
GFR, Est Non African American: 46 mL/min/{1.73_m2} — ABNORMAL LOW (ref >=60)
Globulin: 2.5 g/dL (calc) (ref 1.9–3.7)
Glucose, Bld: 120 mg/dL — ABNORMAL HIGH (ref 65–99)
Potassium: 4.4 mmol/L (ref 3.5–5.3)
Sodium: 139 mmol/L (ref 135–146)
Total Bilirubin: 0.7 mg/dL (ref 0.2–1.2)
Total Protein: 7 g/dL (ref 6.1–8.1)

## 2017-10-04 LAB — LIPID PANEL
CHOL/HDL RATIO: 3.8 (calc) (ref <5.0)
CHOLESTEROL: 170 mg/dL (ref <200)
HDL: 45 mg/dL (ref >40)
LDL CHOLESTEROL (CALC): 109 mg/dL — AB
Non-HDL Cholesterol (Calc): 125 mg/dL (calc) (ref <130)
Triglycerides: 74 mg/dL (ref <150)

## 2017-10-04 LAB — HEMOGLOBIN A1C
Hgb A1c MFr Bld: 6.5 % of total Hgb — ABNORMAL HIGH (ref <5.7)
Mean Plasma Glucose: 140 (calc)
eAG (mmol/L): 7.7 (calc)

## 2017-10-04 LAB — TSH: TSH: 1.18 mIU/L (ref 0.40–4.50)

## 2017-10-04 NOTE — Patient Instructions (Addendum)
  Adjust-A-Heel for L shoe insert   [email or web address]   Access Code: [email or web address]  URL: [email or web address]  Date: 10/04/2017  Prepared by: Zerita Boers   Exercises  Standing Hip Abduction - 10 reps - 3 sets - 1x daily - 3x weekly  Standing Hip Extension - 10 reps - 3 sets - 1x daily - 3x weekly  Backwards Walking - 4 reps - 1 sets - 1x daily - 7x weekly  Wide Stance with Head Rotation on Foam Pad - 10 reps - 1 sets - 2x daily - 7x weekly  Romberg Stance on Foam Pad - 1 reps - 3 sets - 20 hold - 1x daily - 7x weekly  Romberg Stance with Head Nods on Foam Pad - 3 reps - 1 sets - 1x daily - 5x weekly

## 2017-10-04 NOTE — Therapy (Signed)
[facility] [address] [place], [place], [address] Phone: [phone]   Fax:  [phone]  Physical Therapy Treatment  Patient Details  Name: [name] MRN: [id number] Date of Birth: 07-23-1945 Referring Provider: Liane Comber, NP   Encounter Date: 10/04/2017  PT End of Session - 10/04/17 0959    Visit Number  6    Number of Visits  17    Date for PT Re-Evaluation  10/24/17    Authorization Type  [facility] [employer] and Medicare    PT Start Time  [phone]    PT Stop Time  0929    PT Time Calculation (min)  38 min    Equipment Utilized During Treatment  -- min guard to S prn    Activity Tolerance  Patient tolerated treatment well    Behavior During Therapy  [facility] for tasks assessed/performed       Past Medical History:  Diagnosis Date  . Allergy    SEASONAL  . Cataract    SMALL IN LEFT EYE  . CKD (chronic kidney disease), stage III ([place])   . Coronary artery disease    a. Cath 2014 - mild nonobstructive CAD - 40% prox LAD, 30% OM2, otherwise mild irregularities.  . Degenerative joint disease   . Hyperlipidemia   . Hypertension   . Inguinal hernia    right  . Osteoarthritis   . Pre-diabetes   . Pulmonary embolism [facility])    march 2010, 2012              /   DVT x  2  LEFT 2010  . S/P mitral valve repair 08/15/2012   Complex valvuloplasty including triangular resection of posterior leaflet, artificial Gore-tex neocord placement x4 and Sorin Memo 3D ring annuloplasty via right mini thoracotomy approach  . Seasonal allergies   . Severe mitral regurgitation by prior echocardiogram    a. s/p MV repair 2014.  . Sleep apnea    STOP BANG SCORE 4  . Vitamin D deficiency   . Wears glasses     Past Surgical History:  Procedure Laterality Date  . achillis tendon     repair 20 years ago  . CARDIAC CATHETERIZATION    . COLONOSCOPY    . COLONOSCOPY W/ BIOPSIES AND POLYPECTOMY    . HIP ARTHROPLASTY     11/06/07  . INGUINAL  HERNIA REPAIR Right 11/07/2015   Procedure: LAPAROSCOPIC RIGHT INGUINAL HERNIA WITH MESH;  Surgeon: Ralene Ok, MD;  Location: [place];  Service: General;  Laterality: Right;  . INSERTION OF MESH Right 11/07/2015   Procedure: INSERTION OF MESH;  Surgeon: Ralene Ok, MD;  Location: [place];  Service: General;  Laterality: Right;  . INTRAOPERATIVE TRANSESOPHAGEAL ECHOCARDIOGRAM N/A 08/15/2012   Procedure: INTRAOPERATIVE TRANSESOPHAGEAL ECHOCARDIOGRAM;  Surgeon: Rexene Alberts, MD;  Location: [place];  Service: Open Heart Surgery;  Laterality: N/A;  . MITRAL VALVE REPAIR Right 08/15/2012   Procedure: MINIMALLY INVASIVE MITRAL VALVE REPAIR (MVR);  Surgeon: Rexene Alberts, MD;  Location: [place];  Service: Open Heart Surgery;  Laterality: Right;  . TEE WITHOUT CARDIOVERSION N/A 07/04/2012   Procedure: TRANSESOPHAGEAL ECHOCARDIOGRAM (TEE);  Surgeon: Larey Dresser, MD;  Location: [place];  Service: Cardiovascular;  Laterality: N/A;  . TOTAL HIP ARTHROPLASTY  08/24/2011   Procedure: TOTAL HIP ARTHROPLASTY ANTERIOR APPROACH;  Surgeon: Mauri Pole, MD;  Location: [facility];  Service: Orthopedics;  Laterality: Right;    There were no vitals filed for  this visit.  Subjective Assessment - 10/04/17 0853    Subjective  Pt denied falls or changes since last visit. Pt reported he's still sore from walking on vacation. Pt forgot to tell MD about shoulder at appt. on 10/03/17, but shoulder isn't hurting now.     Pertinent History  HTN, DM with CKD stage 3, OA, mitral valve replacement, DDD, L cataract, CAD, HLD, hx of L DVT and PE, sleep apnea, inguinal hernia repair    Patient Stated Goals  Be more stable when walking and not fall.     Currently in Pain?  Yes    Pain Score  2     Pain Location  -- pain in joints    Pain Orientation  -- diffuse    Pain Type  Acute pain    Pain Onset  More than a month ago    Pain Frequency  Intermittent    Aggravating Factors   movement    Pain Relieving Factors  Tylenol  and cream       Therex and Neuro re-ed: Access Code: [email or web address]  URL: [email or web address]  Date: 10/04/2017  Prepared by: Geoffry Paradise   Exercises  Standing Hip Abduction - 10 reps - 3 sets - 1x daily - 3x weekly  Standing Hip Extension - 10 reps - 3 sets - 1x daily - 3x weekly  Backwards Walking - 4 reps - 1 sets - 1x daily - 7x weekly  Wide Stance with Head Rotation on Foam Pad - 10 reps - 1 sets - 2x daily - 7x weekly  Romberg Stance on Foam Pad - 1 reps - 3 sets - 20 hold - 1x daily - 7x weekly  Romberg Stance with Head Nods on Foam Pad - 3 reps - 1 sets - 1x daily - 5x weekly   No c/o pain during session. Cues and demo for technique and chair/counter for support.                 [facility] Adult PT Treatment/Exercise - 10/04/17 0951      Ambulation/Gait   Ambulation/Gait  Yes    Ambulation/Gait Assistance  5: Supervision    Ambulation/Gait Assistance Details  PT assessed pt's SPC to ensure it was correct height. Cues to improve sequencing, stride length, and heel strike.     Ambulation Distance (Feet)  115 Feet x2 with and without SPC indoors and 500' outdoors    Assistive device  None    Gait Pattern  Step-through pattern;Decreased stride length;Decreased weight shift to left;Decreased dorsiflexion - left;Decreased dorsiflexion - right;Antalgic;Trunk flexed;Lateral trunk lean to right;Decreased arm swing - left    Ambulation Surface  Level;Indoor;Unlevel;Paved            Self Care: PT Education - 10/04/17 0955    Education Details  PT discussed the importance of performing HEP, reviewed HEP and progressed as tolerated. PT provided pt with Adjust-A-Lift (L shoe insert) information and encouraged pt to order on [place], so we can trial Large shoe insert during PT session.     Person(s) Educated  Patient    Methods  Explanation;Demonstration;Verbal cues;Handout    Comprehension  Verbalized understanding;Returned demonstration       PT Short  Term Goals - 09/27/17 0930      PT SHORT TERM GOAL #1   Title  Pt will be IND with HEP to improve balance, strength, and flexibility. TARGET DATE FOR ALL STGS: 09/22/17    Status  Partially Met  PT SHORT TERM GOAL #2   Title  Pt will amb. 300' over even terrain with LRAD at MOD I level to improve safety during functional mobility.     Status  Achieved      PT SHORT TERM GOAL #3   Title  Pt will report zero falls while on vacation to improve safety.     Status  Achieved      PT SHORT TERM GOAL #4   Title  Pt will perform TUG with LRAD in </=13.5sec. to decr. falls risk.     Status  Achieved      PT SHORT TERM GOAL #5   Title  Pt will improve DGI score to >/=14/24 to decr. falls risk.     Status  Achieved        PT Long Term Goals - 08/25/17 1200      PT LONG TERM GOAL #1   Title  Pt will improve DGI score to >/=20/24 to decr. falls risk. TARGET DATE FOR ALL LTGS: 10/20/17    Status  New      PT LONG TERM GOAL #2   Title  Pt will amb. 600' with LRAD over even/uneven terrain with LRAD to improve safety during functional mobility.     Status  New      PT LONG TERM GOAL #3   Title  Pt will report zero falls in the last 4 weeks to improve safety.     Status  New      PT LONG TERM GOAL #4   Title  Pt will amb. 200' over grassy terrain, while carrying a light object to mimic gardening, IND to safely perform yard work.     Status  New            Plan - 10/04/17 1000    Clinical Impression Statement  Today's skilled session focused on updating HEP, along with ensuring pt's SPC is at proper height and sequencing with SPC. Pt demonstrated progress as he tolerated narrow BOS during balance activities well with some incr. in postural sway. Pt progressed to S during amb. with SPC. Continue with POC.     Rehab Potential  Good    Clinical Impairments Affecting Rehab Potential  see above    PT Frequency  2x / week    PT Duration  8 weeks    PT Treatment/Interventions  ADLs/Self  Care Home Management;Biofeedback;Therapeutic activities;Therapeutic exercise;Electrical Stimulation;Functional mobility training;Stair training;Gait training;Patient/family education;Orthotic Fit/Training;DME Instruction;Neuromuscular re-education;Balance training;Vestibular    PT Next Visit Plan  High level balance, gait over compliant surfaces with SPC. L shoulder pain: recommended he see ortho MD), Pt reports L SLS causes L knee to buckle after THA.     Consulted and Agree with Plan of Care  Patient       Patient will benefit from skilled therapeutic intervention in order to improve the following deficits and impairments:  Abnormal gait, Decreased endurance, Impaired UE functional use, Decreased knowledge of use of DME, Decreased strength, Decreased balance, Decreased mobility, Decreased range of motion, Impaired flexibility, Postural dysfunction  Visit Diagnosis: Other abnormalities of gait and mobility  Muscle weakness (generalized)  Unsteadiness on feet     Problem List Patient Active Problem List   Diagnosis Date Noted  . Encounter for therapeutic drug monitoring 08/10/2017  . Encounter for Medicare annual wellness exam 02/23/2016  . Hx of colonic polyps 05/31/2014  . Obesity 04/24/2014  . Medication management 05/24/2013  . DM type 2 causing CKD stage  3 ([place]) 05/24/2013  . Hyperlipidemia   . Hypertension   . Seasonal allergies   . Osteoarthritis   . Vitamin D deficiency   . S/P mitral valve repair 08/15/2012  . Right carotid bruit 06/14/2011  . Long term current use of anticoagulant therapy 11/05/2010    Bren Steers L 10/04/2017, 10:03 AM  [facility] [address] [place], [place], [address] Phone: [phone]   Fax:  [phone]  Name: ONAJE WARNE MRN: [id number] Date of Birth: Sep 11, 1945  Geoffry Paradise, PT,DPT 10/04/17 10:04 AM Phone: [phone] Fax: [phone]

## 2017-10-06 ENCOUNTER — Encounter: Payer: Self-pay | Admitting: Physical Therapy

## 2017-10-06 ENCOUNTER — Ambulatory Visit: Payer: 59 | Admitting: Physical Therapy

## 2017-10-06 DIAGNOSIS — M6281 Muscle weakness (generalized): Secondary | ICD-10-CM | POA: Diagnosis not present

## 2017-10-06 DIAGNOSIS — R2689 Other abnormalities of gait and mobility: Secondary | ICD-10-CM

## 2017-10-06 DIAGNOSIS — R2681 Unsteadiness on feet: Secondary | ICD-10-CM

## 2017-10-06 NOTE — Therapy (Signed)
[facility] [address] [place], [place], [address] Phone: [phone]   Fax:  [phone]  Physical Therapy Treatment  Patient Details  Name: [name] MRN: [id number] Date of Birth: [birth year] Referring Provider: Liane Comber, NP   Encounter Date: 10/06/2017  PT End of Session - 10/06/17 2018    Visit Number  7    Number of Visits  17    Date for PT Re-Evaluation  10/24/17    Authorization Type  [facility] [employer] and Medicare    PT Start Time  [phone]    PT Stop Time  0930    PT Time Calculation (min)  44 min    Equipment Utilized During Treatment  Gait belt    Activity Tolerance  Patient tolerated treatment well    Behavior During Therapy  [facility] for tasks assessed/performed       Past Medical History:  Diagnosis Date  . Allergy    SEASONAL  . Cataract    SMALL IN LEFT EYE  . CKD (chronic kidney disease), stage III ([place])   . Coronary artery disease    a. Cath 2014 - mild nonobstructive CAD - 40% prox LAD, 30% OM2, otherwise mild irregularities.  . Degenerative joint disease   . Hyperlipidemia   . Hypertension   . Inguinal hernia    right  . Osteoarthritis   . Pre-diabetes   . Pulmonary embolism [facility])    march 2010, 2012              /   DVT x  2  LEFT 2010  . S/P mitral valve repair 08/15/2012   Complex valvuloplasty including triangular resection of posterior leaflet, artificial Gore-tex neocord placement x4 and Sorin Memo 3D ring annuloplasty via right mini thoracotomy approach  . Seasonal allergies   . Severe mitral regurgitation by prior echocardiogram    a. s/p MV repair 2014.  . Sleep apnea    STOP BANG SCORE 4  . Vitamin D deficiency   . Wears glasses     Past Surgical History:  Procedure Laterality Date  . achillis tendon     repair 20 years ago  . CARDIAC CATHETERIZATION    . COLONOSCOPY    . COLONOSCOPY W/ BIOPSIES AND POLYPECTOMY    . HIP ARTHROPLASTY     11/06/07  . INGUINAL HERNIA  REPAIR Right 11/07/2015   Procedure: LAPAROSCOPIC RIGHT INGUINAL HERNIA WITH MESH;  Surgeon: Ralene Ok, MD;  Location: [place];  Service: General;  Laterality: Right;  . INSERTION OF MESH Right 11/07/2015   Procedure: INSERTION OF MESH;  Surgeon: Ralene Ok, MD;  Location: [place];  Service: General;  Laterality: Right;  . INTRAOPERATIVE TRANSESOPHAGEAL ECHOCARDIOGRAM N/A 08/15/2012   Procedure: INTRAOPERATIVE TRANSESOPHAGEAL ECHOCARDIOGRAM;  Surgeon: Rexene Alberts, MD;  Location: [place];  Service: Open Heart Surgery;  Laterality: N/A;  . MITRAL VALVE REPAIR Right 08/15/2012   Procedure: MINIMALLY INVASIVE MITRAL VALVE REPAIR (MVR);  Surgeon: Rexene Alberts, MD;  Location: [place];  Service: Open Heart Surgery;  Laterality: Right;  . TEE WITHOUT CARDIOVERSION N/A 07/04/2012   Procedure: TRANSESOPHAGEAL ECHOCARDIOGRAM (TEE);  Surgeon: Larey Dresser, MD;  Location: [place];  Service: Cardiovascular;  Laterality: N/A;  . TOTAL HIP ARTHROPLASTY  08/24/2011   Procedure: TOTAL HIP ARTHROPLASTY ANTERIOR APPROACH;  Surgeon: Mauri Pole, MD;  Location: [facility];  Service: Orthopedics;  Laterality: Right;    There were no vitals filed for this visit.  Subjective  Assessment - 10/06/17 0849    Subjective  When asked how ane height adjustment is working, he reports not really using the cane.  He uses walking poles when out in the community sometimes.    Pertinent History  HTN, DM with CKD stage 3, OA, mitral valve replacement, DDD, L cataract, CAD, HLD, hx of L DVT and PE, sleep apnea, inguinal hernia repair    Patient Stated Goals  Be more stable when walking and not fall.     Currently in Pain?  Yes    Pain Score  2     Pain Location  Knee and L shoulder    Pain Descriptors / Indicators  Dull;Aching    Pain Onset  More than a month ago    Pain Frequency  Intermittent    Aggravating Factors   movement    Pain Relieving Factors  tylenol and cream                       [facility]  Adult PT Treatment/Exercise - 10/06/17 0001      Ambulation/Gait   Ambulation/Gait  Yes    Ambulation/Gait Assistance  5: Supervision    Ambulation Distance (Feet)  230 Feet cane, 100 ft, then 60 ft x 2 no cane    Assistive device  Straight cane;None    Gait Pattern  Step-through pattern;Decreased stride length;Decreased weight shift to left;Decreased dorsiflexion - left;Decreased dorsiflexion - right;Antalgic;Trunk flexed;Lateral trunk lean to right;Decreased arm swing - left    Ambulation Surface  Level;Indoor;Unlevel    Gait Comments  With cane, cues for sequencing using cane in RUE with LLE for improved reciprocal pattern          Balance Exercises - 10/06/17 2012      Balance Exercises: Standing   Standing Eyes Opened  Narrow base of support (BOS);Wide (BOA);Head turns;Foam/compliant surface;5 reps Head nods 5 reps, standing on balance beam    Balance Beam  Marching in place, alternating hip kicks, alternating forward step taps, x 10 reps each with minimal UE support    Other Standing Exercises  On red compliant mat surfaces:  forward/back walking, forward marching, tandem gait, sidestepping, forward walking with head turns, x 2-4 lengths of mats with min guard/supervision      HEP reviewed as follows: Exercises   Standing Hip Abduction - 10 reps same leg, then x 10 reps alternating legs  Standing Hip Extension - 10 reps same leg, then x 10 reps alternating legs  Backwards Walking - 4 reps - 1 set , then forward/back walking 2 reps  Wide Stance with Head Rotation on Foam Pad - 10 reps   Romberg Stance on Foam Pad - 1 reps -  20 sec hold   Romberg Stance with Head Nods on Foam Pad - 3 reps   PT Short Term Goals - 09/27/17 0930      PT SHORT TERM GOAL #1   Title  Pt will be IND with HEP to improve balance, strength, and flexibility. TARGET DATE FOR ALL STGS: 09/22/17    Status  Partially Met      PT SHORT TERM GOAL #2   Title  Pt will amb. 300' over even terrain with  LRAD at MOD I level to improve safety during functional mobility.     Status  Achieved      PT SHORT TERM GOAL #3   Title  Pt will report zero falls while on vacation to improve safety.  Status  Achieved      PT SHORT TERM GOAL #4   Title  Pt will perform TUG with LRAD in </=13.5sec. to decr. falls risk.     Status  Achieved      PT SHORT TERM GOAL #5   Title  Pt will improve DGI score to >/=14/24 to decr. falls risk.     Status  Achieved        PT Long Term Goals - 08/25/17 1200      PT LONG TERM GOAL #1   Title  Pt will improve DGI score to >/=20/24 to decr. falls risk. TARGET DATE FOR ALL LTGS: 10/20/17    Status  New      PT LONG TERM GOAL #2   Title  Pt will amb. 600' with LRAD over even/uneven terrain with LRAD to improve safety during functional mobility.     Status  New      PT LONG TERM GOAL #3   Title  Pt will report zero falls in the last 4 weeks to improve safety.     Status  New      PT LONG TERM GOAL #4   Title  Pt will amb. 200' over grassy terrain, while carrying a light object to mimic gardening, IND to safely perform yard work.     Status  New            Plan - 10/06/17 2019    Clinical Impression Statement  Skilled PT session focused on review of HEP given last visit, with pt return demo understanding.  Also, addressed compliant surface balance activities and gait with single point cane.  Pt will continue to benefit from skilled PT to address balance, gait towards goals.    Rehab Potential  Good    Clinical Impairments Affecting Rehab Potential  see above    PT Frequency  2x / week    PT Duration  8 weeks    PT Treatment/Interventions  ADLs/Self Care Home Management;Biofeedback;Therapeutic activities;Therapeutic exercise;Electrical Stimulation;Functional mobility training;Stair training;Gait training;Patient/family education;Orthotic Fit/Training;DME Instruction;Neuromuscular re-education;Balance training;Vestibular    PT Next Visit Plan  Continue  High level balance, gait over compliant surfaces with SPC. L shoulder pain: recommended he see ortho MD), Pt reports L SLS causes L knee to buckle after THA.     Consulted and Agree with Plan of Care  Patient       Patient will benefit from skilled therapeutic intervention in order to improve the following deficits and impairments:  Abnormal gait, Decreased endurance, Impaired UE functional use, Decreased knowledge of use of DME, Decreased strength, Decreased balance, Decreased mobility, Decreased range of motion, Impaired flexibility, Postural dysfunction  Visit Diagnosis: Unsteadiness on feet  Other abnormalities of gait and mobility     Problem List Patient Active Problem List   Diagnosis Date Noted  . Encounter for therapeutic drug monitoring 08/10/2017  . Encounter for Medicare annual wellness exam 02/23/2016  . Hx of colonic polyps 05/31/2014  . Obesity 04/24/2014  . Medication management 05/24/2013  . DM type 2 causing CKD stage 3 ([place]) 05/24/2013  . Hyperlipidemia   . Hypertension   . Seasonal allergies   . Osteoarthritis   . Vitamin D deficiency   . S/P mitral valve repair 08/15/2012  . Right carotid bruit 06/14/2011  . Long term current use of anticoagulant therapy 11/05/2010    Marcus Butt. 10/06/2017, 8:22 PM  Marcus Butt., PT   [facility]  [place], [place], [address] Phone: [phone]   Fax:  [phone]  Name: [name] MRN: [id number] Date of Birth: [birth year]

## 2017-10-10 ENCOUNTER — Encounter: Payer: Self-pay | Admitting: Physical Therapy

## 2017-10-10 ENCOUNTER — Ambulatory Visit: Payer: 59 | Admitting: Physical Therapy

## 2017-10-10 DIAGNOSIS — M6281 Muscle weakness (generalized): Secondary | ICD-10-CM

## 2017-10-10 DIAGNOSIS — H5203 Hypermetropia, bilateral: Secondary | ICD-10-CM | POA: Diagnosis not present

## 2017-10-10 DIAGNOSIS — R2689 Other abnormalities of gait and mobility: Secondary | ICD-10-CM

## 2017-10-10 DIAGNOSIS — E113293 Type 2 diabetes mellitus with mild nonproliferative diabetic retinopathy without macular edema, bilateral: Secondary | ICD-10-CM | POA: Diagnosis not present

## 2017-10-10 DIAGNOSIS — R2681 Unsteadiness on feet: Secondary | ICD-10-CM

## 2017-10-10 LAB — HM DIABETES EYE EXAM

## 2017-10-10 NOTE — Therapy (Signed)
[facility] [address] [place], [place], [address] Phone: [phone]   Fax:  [phone]  Physical Therapy Treatment  Patient Details  Name: [name] MRN: [id number] Date of Birth: [birth year] Referring Provider: Liane Comber, NP   Encounter Date: 10/10/2017  PT End of Session - 10/10/17 1204    Visit Number  8    Number of Visits  17    Date for PT Re-Evaluation  10/24/17    Authorization Type  [facility] and Medicare    PT Start Time  1104    PT Stop Time  1145    PT Time Calculation (min)  41 min    Equipment Utilized During Treatment  Gait belt    Activity Tolerance  Patient tolerated treatment well    Behavior During Therapy  [facility] for tasks assessed/performed       Past Medical History:  Diagnosis Date  . Allergy    SEASONAL  . Cataract    SMALL IN LEFT EYE  . CKD (chronic kidney disease), stage III ([place])   . Coronary artery disease    a. Cath 2014 - mild nonobstructive CAD - 40% prox LAD, 30% OM2, otherwise mild irregularities.  . Degenerative joint disease   . Hyperlipidemia   . Hypertension   . Inguinal hernia    right  . Osteoarthritis   . Pre-diabetes   . Pulmonary embolism [facility])    march 2010, 2012              /   DVT x  2  LEFT 2010  . S/P mitral valve repair 08/15/2012   Complex valvuloplasty including triangular resection of posterior leaflet, artificial Gore-tex neocord placement x4 and Sorin Memo 3D ring annuloplasty via right mini thoracotomy approach  . Seasonal allergies   . Severe mitral regurgitation by prior echocardiogram    a. s/p MV repair 2014.  . Sleep apnea    STOP BANG SCORE 4  . Vitamin D deficiency   . Wears glasses     Past Surgical History:  Procedure Laterality Date  . achillis tendon     repair 20 years ago  . CARDIAC CATHETERIZATION    . COLONOSCOPY    . COLONOSCOPY W/ BIOPSIES AND POLYPECTOMY    . HIP ARTHROPLASTY     11/06/07  . INGUINAL HERNIA  REPAIR Right 11/07/2015   Procedure: LAPAROSCOPIC RIGHT INGUINAL HERNIA WITH MESH;  Surgeon: Ralene Ok, MD;  Location: [place];  Service: General;  Laterality: Right;  . INSERTION OF MESH Right 11/07/2015   Procedure: INSERTION OF MESH;  Surgeon: Ralene Ok, MD;  Location: [place];  Service: General;  Laterality: Right;  . INTRAOPERATIVE TRANSESOPHAGEAL ECHOCARDIOGRAM N/A 08/15/2012   Procedure: INTRAOPERATIVE TRANSESOPHAGEAL ECHOCARDIOGRAM;  Surgeon: Rexene Alberts, MD;  Location: [place];  Service: Open Heart Surgery;  Laterality: N/A;  . MITRAL VALVE REPAIR Right 08/15/2012   Procedure: MINIMALLY INVASIVE MITRAL VALVE REPAIR (MVR);  Surgeon: Rexene Alberts, MD;  Location: [place];  Service: Open Heart Surgery;  Laterality: Right;  . TEE WITHOUT CARDIOVERSION N/A 07/04/2012   Procedure: TRANSESOPHAGEAL ECHOCARDIOGRAM (TEE);  Surgeon: Larey Dresser, MD;  Location: [place];  Service: Cardiovascular;  Laterality: N/A;  . TOTAL HIP ARTHROPLASTY  08/24/2011   Procedure: TOTAL HIP ARTHROPLASTY ANTERIOR APPROACH;  Surgeon: Mauri Pole, MD;  [facility];  Service: Orthopedics;  Laterality: Right;    There were no vitals filed for this visit.  Subjective  Assessment - 10/10/17 1104    Subjective  Nothing new to report.  Pt forgot to talk to PCP about L shoulder pain; plans to follow up and call MD to address issue.    Pertinent History  HTN, DM with CKD stage 3, OA, mitral valve replacement, DDD, L cataract, CAD, HLD, hx of L DVT and PE, sleep apnea, inguinal hernia repair    Patient Stated Goals  Be more stable when walking and not fall.     Currently in Pain?  No/denies    Pain Onset  More than a month ago                       [facility] Adult PT Treatment/Exercise - 10/10/17 0001      Ambulation/Gait   Ambulation/Gait  Yes    Ambulation/Gait Assistance  5: Supervision    Ambulation/Gait Assistance Details  training for increasing initial heel strike; pt had L foot  drag; self corrected. Reported fatigue mid session.                          Ambulation Distance (Feet)  100 Feet    Assistive device  None    Gait Pattern  Step-through pattern;Decreased stride length;Decreased weight shift to left;Decreased dorsiflexion - left;Decreased dorsiflexion - right;Antalgic;Trunk flexed;Lateral trunk lean to right;Decreased arm swing - left    Ambulation Surface  Level;Indoor      Exercises   Exercises  Knee/Hip      Knee/Hip Exercises: Aerobic   Stepper  scifit stepper L= 2.0 , 8 min         Balance Exercises - 10/10/17 1107      Balance Exercises: Standing   Standing, One Foot on a Step  Eyes open;Foam/compliant surface;2 inch one foot on balance beam stepping over forward then back; cues for foot clearance; intermittant UE support.   Stepping Strategy  Anterior;Posterior;Lateral;Foam/compliant surface;UE support intermittent UE support; stepping on//off mat; cues for weight shifting and coordinating foot clearance.        PT Education - 10/10/17 1202    Education Details  Educated pt on importance of cardiovascular fitness to address fatigue, increase circulation and on a seated stepper to work on ROM and strength    Person(s) Educated  Patient    Methods  Explanation;Verbal cues    Comprehension  Verbalized understanding;Returned demonstration       PT Short Term Goals - 09/27/17 0930      PT SHORT TERM GOAL #1   Title  Pt will be IND with HEP to improve balance, strength, and flexibility. TARGET DATE FOR ALL STGS: 09/22/17    Status  Partially Met      PT SHORT TERM GOAL #2   Title  Pt will amb. 300' over even terrain with LRAD at MOD I level to improve safety during functional mobility.     Status  Achieved      PT SHORT TERM GOAL #3   Title  Pt will report zero falls while on vacation to improve safety.     Status  Achieved      PT SHORT TERM GOAL #4   Title  Pt will perform TUG with LRAD in </=13.5sec. to decr. falls risk.     Status   Achieved      PT SHORT TERM GOAL #5   Title  Pt will improve DGI score to >/=14/24 to decr. falls risk.  Status  Achieved        PT Long Term Goals - 08/25/17 1200      PT LONG TERM GOAL #1   Title  Pt will improve DGI score to >/=20/24 to decr. falls risk. TARGET DATE FOR ALL LTGS: 10/20/17    Status  New      PT LONG TERM GOAL #2   Title  Pt will amb. 600' with LRAD over even/uneven terrain with LRAD to improve safety during functional mobility.     Status  New      PT LONG TERM GOAL #3   Title  Pt will report zero falls in the last 4 weeks to improve safety.     Status  New      PT LONG TERM GOAL #4   Title  Pt will amb. 200' over grassy terrain, while carrying a light object to mimic gardening, IND to safely perform yard work.     Status  New            Plan - 10/10/17 1156    Clinical Impression Statement  Balance training: stepping/weight shifting strategies on/off compliant surface; pt required cues for body mechanics and intermittent UE support. Gait training: worked on increasing initial heelstrike for better foot clearance; pt amb. without cane but did have left toe drag and reported fatigue mid session.  Educated pt on importance of cardiovascular fitness to address fatigue, increase circulation and on a seated stepper to work on ROM and strength; pt has access to a gym and plans to start to go.                                                   Rehab Potential  Good    Clinical Impairments Affecting Rehab Potential  see above    PT Frequency  2x / week    PT Duration  8 weeks    PT Treatment/Interventions  ADLs/Self Care Home Management;Biofeedback;Therapeutic activities;Therapeutic exercise;Electrical Stimulation;Functional mobility training;Stair training;Gait training;Patient/family education;Orthotic Fit/Training;DME Instruction;Neuromuscular re-education;Balance training;Vestibular    PT Next Visit Plan Ankle hip strategies (wall bumps) Continue High level  balance, gait over compliant surfaces with SPC. L shoulder pain: recommended he see ortho MD), Pt reports L SLS causes L knee to buckle after THA.     Consulted and Agree with Plan of Care  Patient       Patient will benefit from skilled therapeutic intervention in order to improve the following deficits and impairments:  Abnormal gait, Decreased endurance, Impaired UE functional use, Decreased knowledge of use of DME, Decreased strength, Decreased balance, Decreased mobility, Decreased range of motion, Impaired flexibility, Postural dysfunction  Visit Diagnosis: Unsteadiness on feet  Other abnormalities of gait and mobility  Muscle weakness (generalized)     Problem List Patient Active Problem List   Diagnosis Date Noted  . Encounter for therapeutic drug monitoring 08/10/2017  . Encounter for Medicare annual wellness exam 02/23/2016  . Hx of colonic polyps 05/31/2014  . Obesity 04/24/2014  . Medication management 05/24/2013  . DM type 2 causing CKD stage 3 ([place]) 05/24/2013  . Hyperlipidemia   . Hypertension   . Seasonal allergies   . Osteoarthritis   . Vitamin D deficiency   . S/P mitral valve repair 08/15/2012  . Right carotid bruit 06/14/2011  . Long term current use of anticoagulant  therapy 11/05/2010    Bjorn Loser 10/10/2017, 12:06 PM  [facility] [address] [place], [place], [address] Phone: [phone]   Fax:  [phone]  Name: [name] MRN: [id number] Date of Birth: 10/16/1945

## 2017-10-12 ENCOUNTER — Ambulatory Visit (INDEPENDENT_AMBULATORY_CARE_PROVIDER_SITE_OTHER): Payer: 59 | Admitting: *Deleted

## 2017-10-12 DIAGNOSIS — Z9889 Other specified postprocedural states: Secondary | ICD-10-CM | POA: Diagnosis not present

## 2017-10-12 DIAGNOSIS — Z5181 Encounter for therapeutic drug level monitoring: Secondary | ICD-10-CM | POA: Diagnosis not present

## 2017-10-12 DIAGNOSIS — I2699 Other pulmonary embolism without acute cor pulmonale: Secondary | ICD-10-CM

## 2017-10-12 DIAGNOSIS — Z7901 Long term (current) use of anticoagulants: Secondary | ICD-10-CM

## 2017-10-12 LAB — POCT INR: INR: 2.9 (ref 2.0–3.0)

## 2017-10-12 NOTE — Patient Instructions (Signed)
Description   Continue taking  same dosage 1 tablet daily except 2 tablets on Mondays, Wednesdays and Fridays.  Recheck in 3 weeks. Please call with any medication changes or if scheduled for any procedures [facility]# [phone]. Main # [phone].

## 2017-10-13 ENCOUNTER — Ambulatory Visit: Payer: 59 | Admitting: Rehabilitation

## 2017-10-13 ENCOUNTER — Encounter: Payer: Self-pay | Admitting: Rehabilitation

## 2017-10-13 DIAGNOSIS — R2689 Other abnormalities of gait and mobility: Secondary | ICD-10-CM

## 2017-10-13 DIAGNOSIS — R2681 Unsteadiness on feet: Secondary | ICD-10-CM

## 2017-10-13 DIAGNOSIS — M6281 Muscle weakness (generalized): Secondary | ICD-10-CM | POA: Diagnosis not present

## 2017-10-13 NOTE — Therapy (Signed)
[facility] [address] [place], [place], [address] Phone: [phone]   Fax:  [phone]  Physical Therapy Treatment  Patient Details  Name: [name] MRN: [id number] Date of Birth: 08-Sep-1945 Referring Provider: Liane Comber, NP   Encounter Date: 10/13/2017  PT End of Session - 10/13/17 1055    Visit Number  9    Number of Visits  17    Date for PT Re-Evaluation  10/24/17    Authorization Type  [facility] [employer] and Medicare    PT Start Time  1018    PT Stop Time  1102    PT Time Calculation (min)  44 min    Equipment Utilized During Treatment  Gait belt    Activity Tolerance  Patient tolerated treatment well    Behavior During Therapy  [facility] for tasks assessed/performed       Past Medical History:  Diagnosis Date  . Allergy    SEASONAL  . Cataract    SMALL IN LEFT EYE  . CKD (chronic kidney disease), stage III ([place])   . Coronary artery disease    a. Cath 2014 - mild nonobstructive CAD - 40% prox LAD, 30% OM2, otherwise mild irregularities.  . Degenerative joint disease   . Hyperlipidemia   . Hypertension   . Inguinal hernia    right  . Osteoarthritis   . Pre-diabetes   . Pulmonary embolism [facility])    march 2010, 2012              /   DVT x  2  LEFT 2010  . S/P mitral valve repair 08/15/2012   Complex valvuloplasty including triangular resection of posterior leaflet, artificial Gore-tex neocord placement x4 and Sorin Memo 3D ring annuloplasty via right mini thoracotomy approach  . Seasonal allergies   . Severe mitral regurgitation by prior echocardiogram    a. s/p MV repair 2014.  . Sleep apnea    STOP BANG SCORE 4  . Vitamin D deficiency   . Wears glasses     Past Surgical History:  Procedure Laterality Date  . achillis tendon     repair 20 years ago  . CARDIAC CATHETERIZATION    . COLONOSCOPY    . COLONOSCOPY W/ BIOPSIES AND POLYPECTOMY    . HIP ARTHROPLASTY     11/06/07  . INGUINAL HERNIA  REPAIR Right 11/07/2015   Procedure: LAPAROSCOPIC RIGHT INGUINAL HERNIA WITH MESH;  Surgeon: Ralene Ok, MD;  Location: [place];  Service: General;  Laterality: Right;  . INSERTION OF MESH Right 11/07/2015   Procedure: INSERTION OF MESH;  Surgeon: Ralene Ok, MD;  Location: [place];  Service: General;  Laterality: Right;  . INTRAOPERATIVE TRANSESOPHAGEAL ECHOCARDIOGRAM N/A 08/15/2012   Procedure: INTRAOPERATIVE TRANSESOPHAGEAL ECHOCARDIOGRAM;  Surgeon: Rexene Alberts, MD;  Location: [place];  Service: Open Heart Surgery;  Laterality: N/A;  . MITRAL VALVE REPAIR Right 08/15/2012   Procedure: MINIMALLY INVASIVE MITRAL VALVE REPAIR (MVR);  Surgeon: Rexene Alberts, MD;  Location: [place];  Service: Open Heart Surgery;  Laterality: Right;  . TEE WITHOUT CARDIOVERSION N/A 07/04/2012   Procedure: TRANSESOPHAGEAL ECHOCARDIOGRAM (TEE);  Surgeon: Larey Dresser, MD;  Location: [place];  Service: Cardiovascular;  Laterality: N/A;  . TOTAL HIP ARTHROPLASTY  08/24/2011   Procedure: TOTAL HIP ARTHROPLASTY ANTERIOR APPROACH;  Surgeon: Mauri Pole, MD;  [facility];  Service: Orthopedics;  Laterality: Right;    There were no vitals filed for this visit.  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  Reports increased fatigue this week.      Pertinent History  HTN, DM with CKD stage 3, OA, mitral valve replacement, DDD, L cataract, CAD, HLD, hx of L DVT and PE, sleep apnea, inguinal hernia repair    Patient Stated Goals  Be more stable when walking and not fall.     Currently in Pain?  No/denies    Pain Score  4     Pain Location  Hip    Pain Orientation  Right;Left    Pain Descriptors / Indicators  Aching    Pain Type  Acute pain    Pain Onset  More than a month ago    Pain Frequency  Intermittent    Aggravating Factors   movement    Pain Relieving Factors  tylenol and cream                        [facility]  Adult PT Treatment/Exercise - 10/13/17 0001      Ambulation/Gait   Ambulation/Gait  Yes    Ambulation/Gait Assistance  5: Supervision    Ambulation/Gait Assistance Details  Had pt ambulate around track during active rest x 345' without AD.  Note he continues to have intermittent catching of RLE.  He reports that he is still working on getting heel lift for L shoe to decrease leg length descrepancy.  PT verbalized agreement with this and educated on benefits of heel lift for decreased gait compensations and to decrease back pain.     Ambulation Distance (Feet)  345 Feet    Assistive device  None    Gait Pattern  Step-through pattern;Decreased stride length;Decreased weight shift to left;Decreased dorsiflexion - left;Decreased dorsiflexion - right;Antalgic;Trunk flexed;Lateral trunk lean to right;Decreased arm swing - left    Ambulation Surface  Level;Indoor      Neuro Re-ed    Neuro Re-ed Details   High level balance in // bars; single leg in stance on small rocker board advancing opposite LE from ground to chair seat x 10 reps on each side with intermittent UE support but cues to limit support as able.  cues for improved controlled when moving LLE along with forward weight shift onto stance LE.  Continued with standing on BOSU (inverted) maintaining balance x 3 reps of 30 secs with intermittent UE support and tactile cues for upright posture and increased B hip extension.  Note marked hip tightness and also decreased ability to demo forward weight shift towards toes.  Had pt try to shift BOSU in clockwise pattern with heavy facilitation for forward weight shift with BUE support from PT.  Progressed to standing on ramp facing uphill on blue mat with feet apart x 30 sec, feet together x 30 secs, feet staggered x 20 sec each way, feet together EC x 20 secs to increase forward weight shift and ankle/hip strategy.        Knee/Hip Exercises: Stretches   Hip Flexor Stretch  Both;1 rep 2 mins on each side off  EOM      Knee/Hip Exercises: Aerobic   Stepper  scifit stepper L= 2.0 x 7 mins.              PT Education - 10/13/17 1054    Education Details  Continue to educate on importance of cardiovascular fitness, hip flex stretch.     Person(s) Educated  Patient    Methods  Explanation;Demonstration    Comprehension  Verbalized understanding;Returned demonstration       PT Short Term Goals - 09/27/17 0930      PT SHORT TERM GOAL #1   Title  Pt will be IND with HEP to improve balance, strength, and flexibility. TARGET DATE FOR ALL STGS: 09/22/17    Status  Partially Met      PT SHORT TERM GOAL #2   Title  Pt will amb. 300' over even terrain with LRAD at MOD I level to improve safety during functional mobility.     Status  Achieved      PT SHORT TERM GOAL #3   Title  Pt will report zero falls while on vacation to improve safety.     Status  Achieved      PT SHORT TERM GOAL #4   Title  Pt will perform TUG with LRAD in </=13.5sec. to decr. falls risk.     Status  Achieved      PT SHORT TERM GOAL #5   Title  Pt will improve DGI score to >/=14/24 to decr. falls risk.     Status  Achieved        PT Long Term Goals - 08/25/17 1200      PT LONG TERM GOAL #1   Title  Pt will improve DGI score to >/=20/24 to decr. falls risk. TARGET DATE FOR ALL LTGS: 10/20/17    Status  New      PT LONG TERM GOAL #2   Title  Pt will amb. 600' with LRAD over even/uneven terrain with LRAD to improve safety during functional mobility.     Status  New      PT LONG TERM GOAL #3   Title  Pt will report zero falls in the last 4 weeks to improve safety.     Status  New      PT LONG TERM GOAL #4   Title  Pt will amb. 200' over grassy terrain, while carrying a light object to mimic gardening, IND to safely perform yard work.     Status  New            Plan - 10/13/17 1055    Clinical Impression Statement  Continue to address high level balance for ankle, hip and stepping strategy.  Pt tends to  keep weight shifted posteriorly with noted hip flex tightness, therefore provided pt with hip flex stretch during session.  Pt progressing well, however discussed current POC and that he has not met visits set for him.  Will begin to look at goals next week and add visits if needed and re-cert.  Pt verbalized understanding.      Rehab Potential  Good    Clinical Impairments Affecting Rehab Potential  see above    PT Frequency  2x / week    PT Duration  8 weeks    PT Treatment/Interventions  ADLs/Self Care Home Management;Biofeedback;Therapeutic activities;Therapeutic exercise;Electrical Stimulation;Functional mobility training;Stair training;Gait training;Patient/family education;Orthotic Fit/Training;DME Instruction;Neuromuscular re-education;Balance training;Vestibular    PT Next Visit Plan  10th visit note, Begin to look at [place] and re-cert if needed.  Continue High level balance, gait over compliant surfaces with SPC. L shoulder pain: recommended he see ortho MD), Pt reports L SLS causes L knee to buckle after THA.     Consulted and Agree with Plan of Care  Patient       Patient will benefit from skilled therapeutic intervention in  order to improve the following deficits and impairments:  Abnormal gait, Decreased endurance, Impaired UE functional use, Decreased knowledge of use of DME, Decreased strength, Decreased balance, Decreased mobility, Decreased range of motion, Impaired flexibility, Postural dysfunction  Visit Diagnosis: Unsteadiness on feet  Other abnormalities of gait and mobility  Muscle weakness (generalized)     Problem List Patient Active Problem List   Diagnosis Date Noted  . Encounter for therapeutic drug monitoring 08/10/2017  . Encounter for Medicare annual wellness exam 02/23/2016  . Hx of colonic polyps 05/31/2014  . Obesity 04/24/2014  . Medication management 05/24/2013  . DM type 2 causing CKD stage 3 ([place]) 05/24/2013  . Hyperlipidemia   . Hypertension   .  Seasonal allergies   . Osteoarthritis   . Vitamin D deficiency   . S/P mitral valve repair 08/15/2012  . Right carotid bruit 06/14/2011  . Long term current use of anticoagulant therapy 11/05/2010    Cameron Sprang, PT, MPT [facility] [address] [place], [place], [address] Phone: [phone]   Fax:  [phone] 10/13/17, 12:11 PM  Name: [name] MRN: [id number] Date of Birth: 1945/12/13

## 2017-10-19 DIAGNOSIS — M25512 Pain in left shoulder: Secondary | ICD-10-CM | POA: Diagnosis not present

## 2017-10-20 ENCOUNTER — Ambulatory Visit: Payer: 59 | Attending: Adult Health

## 2017-10-20 DIAGNOSIS — M6281 Muscle weakness (generalized): Secondary | ICD-10-CM | POA: Diagnosis not present

## 2017-10-20 DIAGNOSIS — R2689 Other abnormalities of gait and mobility: Secondary | ICD-10-CM | POA: Diagnosis not present

## 2017-10-20 DIAGNOSIS — R2681 Unsteadiness on feet: Secondary | ICD-10-CM

## 2017-10-20 NOTE — Therapy (Signed)
[facility] [address] [place], [place], [address] Phone: [phone]   Fax:  [phone]  Physical Therapy Treatment  Patient Details  Name: [name] MRN: [id number] Date of Birth: Jan 03, 1946 Referring Provider: Liane Comber, NP   Encounter Date: 10/20/2017  PT End of Session - 10/20/17 0832    Visit Number  10    Number of Visits  17    Date for PT Re-Evaluation  10/24/17    Authorization Type  [facility] and Medicare    PT Start Time  0804    PT Stop Time  0829 d/c    PT Time Calculation (min)  25 min    Activity Tolerance  Patient tolerated treatment well    Behavior During Therapy  [facility] for tasks assessed/performed       Past Medical History:  Diagnosis Date  . Allergy    SEASONAL  . Cataract    SMALL IN LEFT EYE  . CKD (chronic kidney disease), stage III ([place])   . Coronary artery disease    a. Cath 2014 - mild nonobstructive CAD - 40% prox LAD, 30% OM2, otherwise mild irregularities.  . Degenerative joint disease   . Hyperlipidemia   . Hypertension   . Inguinal hernia    right  . Osteoarthritis   . Pre-diabetes   . Pulmonary embolism [facility])    march 2010, 2012              /   DVT x  2  LEFT 2010  . S/P mitral valve repair 08/15/2012   Complex valvuloplasty including triangular resection of posterior leaflet, artificial Gore-tex neocord placement x4 and Sorin Memo 3D ring annuloplasty via right mini thoracotomy approach  . Seasonal allergies   . Severe mitral regurgitation by prior echocardiogram    a. s/p MV repair 2014.  . Sleep apnea    STOP BANG SCORE 4  . Vitamin D deficiency   . Wears glasses     Past Surgical History:  Procedure Laterality Date  . achillis tendon     repair 20 years ago  . CARDIAC CATHETERIZATION    . COLONOSCOPY    . COLONOSCOPY W/ BIOPSIES AND POLYPECTOMY    . HIP ARTHROPLASTY     11/06/07  . INGUINAL HERNIA REPAIR Right 11/07/2015   Procedure: LAPAROSCOPIC  RIGHT INGUINAL HERNIA WITH MESH;  Surgeon: Ralene Ok, MD;  Location: [place];  Service: General;  Laterality: Right;  . INSERTION OF MESH Right 11/07/2015   Procedure: INSERTION OF MESH;  Surgeon: Ralene Ok, MD;  Location: [place];  Service: General;  Laterality: Right;  . INTRAOPERATIVE TRANSESOPHAGEAL ECHOCARDIOGRAM N/A 08/15/2012   Procedure: INTRAOPERATIVE TRANSESOPHAGEAL ECHOCARDIOGRAM;  Surgeon: Rexene Alberts, MD;  Location: [place];  Service: Open Heart Surgery;  Laterality: N/A;  . MITRAL VALVE REPAIR Right 08/15/2012   Procedure: MINIMALLY INVASIVE MITRAL VALVE REPAIR (MVR);  Surgeon: Rexene Alberts, MD;  Location: [place];  Service: Open Heart Surgery;  Laterality: Right;  . TEE WITHOUT CARDIOVERSION N/A 07/04/2012   Procedure: TRANSESOPHAGEAL ECHOCARDIOGRAM (TEE);  Surgeon: Larey Dresser, MD;  Location: [place];  Service: Cardiovascular;  Laterality: N/A;  . TOTAL HIP ARTHROPLASTY  08/24/2011   Procedure: TOTAL HIP ARTHROPLASTY ANTERIOR APPROACH;  Surgeon: Mauri Pole, MD;  [facility];  Service: Orthopedics;  Laterality: Right;    There were no vitals filed for this visit.  Subjective Assessment - 10/20/17 0807    Subjective  Pt reported he saw the orthopedic doctor and will have imaging done next week to check left shoulder as he may need surgery for potential torn muscle tendon. Pt denied falls. Pt ordered shoe lift but got the wrong size, in process of exchanging. Pt went fishing last weekend and it went well.     Pertinent History  HTN, DM with CKD stage 3, OA, mitral valve replacement, DDD, L cataract, CAD, HLD, hx of L DVT and PE, sleep apnea, inguinal hernia repair    Patient Stated Goals  Be more stable when walking and not fall.     Currently in Pain?  No/denies    Pain Score  0-No pain but 5/10 L shoulder pain with movement                       [facility] Adult PT Treatment/Exercise - 10/20/17 0810      Ambulation/Gait   Ambulation/Gait  Yes     Ambulation/Gait Assistance  7: Independent    Ambulation/Gait Assistance Details  Pt amb. without LOB over grassy and paved surfaces, carrying a 2.2lb. object to mimic garden tools. Pt continues to experience gait deviations but is in process of obtaining shoe lift to decr. deviations.     Ambulation Distance (Feet)  600 Feet    Assistive device  None    Gait Pattern  Step-through pattern;Decreased stride length;Decreased weight shift to left;Decreased dorsiflexion - left;Decreased dorsiflexion - right;Antalgic;Trunk flexed;Lateral trunk lean to right;Decreased arm swing - left    Ambulation Surface  Level;Unlevel;Indoor;Outdoor;Paved;Grass      Standardized Balance Assessment   Standardized Balance Assessment  Dynamic Gait Index;Timed Up and Go Test      Dynamic Gait Index   Level Surface  Mild Impairment    Change in Gait Speed  Mild Impairment    Gait with Horizontal Head Turns  Normal    Gait with Vertical Head Turns  Normal    Gait and Pivot Turn  Normal    Step Over Obstacle  Normal    Step Around Obstacles  Normal    Steps  Mild Impairment no rail to ascend and one rail to descend    Total Score  21             PT Education - 10/20/17 7282    Education Details  PT discussed goal progress and pt agreeable to d/c. PT reiterated the importance of performing HEP and reviewed with pt.     Person(s) Educated  Patient    Methods  Explanation    Comprehension  Verbalized understanding       PT Short Term Goals - 09/27/17 0930      PT SHORT TERM GOAL #1   Title  Pt will be IND with HEP to improve balance, strength, and flexibility. TARGET DATE FOR ALL STGS: 09/22/17    Status  Partially Met      PT SHORT TERM GOAL #2   Title  Pt will amb. 300' over even terrain with LRAD at MOD I level to improve safety during functional mobility.     Status  Achieved      PT SHORT TERM GOAL #3   Title  Pt will report zero falls while on vacation to improve safety.     Status   Achieved      PT SHORT TERM GOAL #4   Title  Pt will perform TUG with LRAD in </=13.5sec. to decr. falls risk.  Status  Achieved      PT SHORT TERM GOAL #5   Title  Pt will improve DGI score to >/=14/24 to decr. falls risk.     Status  Achieved        PT Long Term Goals - 10/20/17 4742      PT LONG TERM GOAL #1   Title  Pt will improve DGI score to >/=20/24 to decr. falls risk. TARGET DATE FOR ALL LTGS: 10/20/17    Status  Achieved      PT LONG TERM GOAL #2   Title  Pt will amb. 600' with LRAD over even/uneven terrain with LRAD to improve safety during functional mobility.     Status  Achieved      PT LONG TERM GOAL #3   Title  Pt will report zero falls in the last 4 weeks to improve safety.     Status  Achieved      PT LONG TERM GOAL #4   Title  Pt will amb. 200' over grassy terrain, while carrying a light object to mimic gardening, IND to safely perform yard work.     Status  Achieved            Plan - 10/20/17 5956    Clinical Impression Statement  Pt met all LTGs, please see d/c summary for details.     Rehab Potential  Good    Clinical Impairments Affecting Rehab Potential  see above    PT Frequency  2x / week    PT Duration  8 weeks    PT Treatment/Interventions  ADLs/Self Care Home Management;Biofeedback;Therapeutic activities;Therapeutic exercise;Electrical Stimulation;Functional mobility training;Stair training;Gait training;Patient/family education;Orthotic Fit/Training;DME Instruction;Neuromuscular re-education;Balance training;Vestibular    PT Next Visit Plan  d/c    Consulted and Agree with Plan of Care  Patient       Patient will benefit from skilled therapeutic intervention in order to improve the following deficits and impairments:  Abnormal gait, Decreased endurance, Impaired UE functional use, Decreased knowledge of use of DME, Decreased strength, Decreased balance, Decreased mobility, Decreased range of motion, Impaired flexibility, Postural  dysfunction  Visit Diagnosis: Unsteadiness on feet  Other abnormalities of gait and mobility  Muscle weakness (generalized)     Problem List Patient Active Problem List   Diagnosis Date Noted  . Encounter for therapeutic drug monitoring 08/10/2017  . Encounter for Medicare annual wellness exam 02/23/2016  . Hx of colonic polyps 05/31/2014  . Obesity 04/24/2014  . Medication management 05/24/2013  . DM type 2 causing CKD stage 3 ([place]) 05/24/2013  . Hyperlipidemia   . Hypertension   . Seasonal allergies   . Osteoarthritis   . Vitamin D deficiency   . S/P mitral valve repair 08/15/2012  . Right carotid bruit 06/14/2011  . Long term current use of anticoagulant therapy 11/05/2010    Leily Capek L 10/20/2017, 8:35 AM  [facility] [address] [place], [place], [address] Phone: [phone]   Fax:  [phone]  Name: [name] MRN: [id number] Date of Birth: 05-02-1945  PHYSICAL THERAPY DISCHARGE SUMMARY  Visits from Start of Care: 10  Current functional level related to goals / functional outcomes: PT Long Term Goals - 10/20/17 3220      PT LONG TERM GOAL #1   Title  Pt will improve DGI score to >/=20/24 to decr. falls risk. TARGET DATE FOR ALL LTGS: 10/20/17    Status  Achieved      PT LONG TERM  GOAL #2   Title  Pt will amb. 600' with LRAD over even/uneven terrain with LRAD to improve safety during functional mobility.     Status  Achieved      PT LONG TERM GOAL #3   Title  Pt will report zero falls in the last 4 weeks to improve safety.     Status  Achieved      PT LONG TERM GOAL #4   Title  Pt will amb. 200' over grassy terrain, while carrying a light object to mimic gardening, IND to safely perform yard work.     Status  Achieved         Remaining deficits: Lateral trunk lean, but pt obtaining shoe lift to correct.   Education / Equipment: HEP and Web designer.  Plan: Patient agrees to  discharge.  Patient goals were met. Patient is being discharged due to meeting the stated rehab goals.  ?????         Geoffry Paradise, PT,DPT 10/20/17 8:35 AM Phone: [phone] Fax: [phone]

## 2017-10-21 ENCOUNTER — Ambulatory Visit: Payer: 59

## 2017-10-24 ENCOUNTER — Other Ambulatory Visit: Payer: Self-pay | Admitting: Adult Health

## 2017-10-24 ENCOUNTER — Other Ambulatory Visit: Payer: Self-pay | Admitting: Cardiovascular Disease

## 2017-10-24 DIAGNOSIS — E782 Mixed hyperlipidemia: Secondary | ICD-10-CM

## 2017-10-24 MED FILL — EZETIMIBE 10 MG TABLET: 10 | 90 days supply | Qty: 90 | Fill #0

## 2017-10-24 MED FILL — WARFARIN SODIUM 5 MG TABLET: 5 | 90 days supply | Qty: 150 | Fill #0

## 2017-10-25 ENCOUNTER — Ambulatory Visit: Payer: 59

## 2017-10-27 ENCOUNTER — Encounter: Payer: Self-pay | Admitting: *Deleted

## 2017-10-27 ENCOUNTER — Ambulatory Visit: Payer: 59

## 2017-10-27 DIAGNOSIS — M25512 Pain in left shoulder: Secondary | ICD-10-CM | POA: Diagnosis not present

## 2017-11-02 ENCOUNTER — Ambulatory Visit (INDEPENDENT_AMBULATORY_CARE_PROVIDER_SITE_OTHER): Payer: 59 | Admitting: *Deleted

## 2017-11-02 DIAGNOSIS — I2699 Other pulmonary embolism without acute cor pulmonale: Secondary | ICD-10-CM

## 2017-11-02 DIAGNOSIS — Z5181 Encounter for therapeutic drug level monitoring: Secondary | ICD-10-CM | POA: Diagnosis not present

## 2017-11-02 DIAGNOSIS — Z9889 Other specified postprocedural states: Secondary | ICD-10-CM | POA: Diagnosis not present

## 2017-11-02 DIAGNOSIS — Z7901 Long term (current) use of anticoagulants: Secondary | ICD-10-CM | POA: Diagnosis not present

## 2017-11-02 LAB — POCT INR: INR: 3.1 — AB (ref 2.0–3.0)

## 2017-11-02 NOTE — Patient Instructions (Signed)
Description   Change your dosage 1 tablet daily except 2 tablets on Mondays and Fridays.  Recheck in 2-3 weeks. Please call with any medication changes or if scheduled for any procedures [facility]# [phone]. Main # [phone].

## 2017-11-09 DIAGNOSIS — M19012 Primary osteoarthritis, left shoulder: Secondary | ICD-10-CM | POA: Diagnosis not present

## 2017-11-09 DIAGNOSIS — M75122 Complete rotator cuff tear or rupture of left shoulder, not specified as traumatic: Secondary | ICD-10-CM | POA: Diagnosis not present

## 2017-11-09 DIAGNOSIS — M25512 Pain in left shoulder: Secondary | ICD-10-CM | POA: Diagnosis not present

## 2017-11-10 MED FILL — DICLOFENAC SODIUM 75 MG TAB: 75 | 30 days supply | Qty: 60 | Fill #0

## 2017-11-16 ENCOUNTER — Ambulatory Visit (INDEPENDENT_AMBULATORY_CARE_PROVIDER_SITE_OTHER): Payer: 59 | Admitting: *Deleted

## 2017-11-16 DIAGNOSIS — I2699 Other pulmonary embolism without acute cor pulmonale: Secondary | ICD-10-CM | POA: Diagnosis not present

## 2017-11-16 DIAGNOSIS — Z9889 Other specified postprocedural states: Secondary | ICD-10-CM | POA: Diagnosis not present

## 2017-11-16 DIAGNOSIS — Z5181 Encounter for therapeutic drug level monitoring: Secondary | ICD-10-CM | POA: Diagnosis not present

## 2017-11-16 DIAGNOSIS — Z7901 Long term (current) use of anticoagulants: Secondary | ICD-10-CM

## 2017-11-16 LAB — POCT INR: INR: 2.7 (ref 2.0–3.0)

## 2017-11-16 NOTE — Patient Instructions (Signed)
Description   Continue taking 1 tablet daily except 2 tablets on Mondays and Fridays.  Recheck in 3 weeks. Please call with any medication changes or if scheduled for any procedures [facility]# [phone]. Main # [phone].

## 2017-11-22 DIAGNOSIS — M25511 Pain in right shoulder: Secondary | ICD-10-CM | POA: Diagnosis not present

## 2017-11-22 MED FILL — DICLOFENAC SODIUM 1% GEL: 1 | 30 days supply | Qty: 500 | Fill #0

## 2017-11-23 ENCOUNTER — Telehealth: Payer: Self-pay | Admitting: Cardiovascular Disease

## 2017-11-23 ENCOUNTER — Telehealth: Payer: Self-pay

## 2017-11-23 ENCOUNTER — Encounter: Payer: Self-pay | Admitting: Internal Medicine

## 2017-11-23 NOTE — Telephone Encounter (Signed)
   Primary Cardiologist: Verne Carrow, MD  Chart reviewed as part of pre-operative protocol coverage. Patient was contacted 11/23/2017 in reference to pre-operative risk assessment for pending surgery as outlined below.  VERN FITHEN was last seen on 08/16/17 by Ronie Spies, PA.  Since that day, ALEXS BIFANO has done well. He is getting about 5 METS of activity per [facility].   Therefore, based on ACC/AHA guidelines, the patient would be at acceptable risk for the planned procedure without further cardiovascular testing.   Will ask pharmacy to review anticoagulation in case needed to hold for surgery.   [place], [place] 11/23/2017, 4:45 PM

## 2017-11-23 NOTE — Telephone Encounter (Signed)
Walk in pt Form-[facility] Clearance dropped off. Placed in Dr.McAlhany doc box.

## 2017-11-23 NOTE — Telephone Encounter (Signed)
   [facility] Pre-operative Risk Assessment    Request for surgical clearance:  1. What type of surgery is being performed?  Left shoulder arthroscopy   2. When is this surgery scheduled?  TBD   3. What type of clearance is required (medical clearance vs. Pharmacy clearance to hold med vs. Both)? medical  4. Are there any medications that need to be held prior to surgery and how long?    5. Practice name and name of physician performing surgery?  [facility]/ Dr Veverly Fells   6. What is your office phone number [phone]    7.   What is your office fax number [phone]  8.   Anesthesia type (None, local, MAC, general) ? choice   [name] 11/23/2017, 3:24 PM  _________________________________________________________________   (provider comments below)

## 2017-11-24 NOTE — Telephone Encounter (Signed)
I will route this recommendation to the requesting party via Epic fax function and remove from pre-op pool.  Please call with questions.  [place], [place] 11/24/2017, 1:07 PM

## 2017-11-24 NOTE — Telephone Encounter (Signed)
Patient with diagnosis of recurrent pulmonary embolism on warfarin for anticoagulation.    Procedure: left shoulder arthroscopy Date of procedure: TBD  CrCl 63.8 Platelet count 240  Per office protocol, patient can hold warfarin for 5 days prior to procedure.    Patient will need bridging with Lovenox (enoxaparin) around procedure.  Patient has warfarin/INR followed by our [facility] office.  They will dose Lovenox and monitor appropriately.

## 2017-12-08 ENCOUNTER — Ambulatory Visit (INDEPENDENT_AMBULATORY_CARE_PROVIDER_SITE_OTHER): Payer: 59 | Admitting: *Deleted

## 2017-12-08 ENCOUNTER — Encounter (INDEPENDENT_AMBULATORY_CARE_PROVIDER_SITE_OTHER): Payer: Self-pay

## 2017-12-08 DIAGNOSIS — I2699 Other pulmonary embolism without acute cor pulmonale: Secondary | ICD-10-CM | POA: Diagnosis not present

## 2017-12-08 DIAGNOSIS — Z7901 Long term (current) use of anticoagulants: Secondary | ICD-10-CM

## 2017-12-08 DIAGNOSIS — Z9889 Other specified postprocedural states: Secondary | ICD-10-CM | POA: Diagnosis not present

## 2017-12-08 DIAGNOSIS — Z5181 Encounter for therapeutic drug level monitoring: Secondary | ICD-10-CM

## 2017-12-08 LAB — POCT INR: INR: 2.1 (ref 2.0–3.0)

## 2017-12-08 NOTE — Patient Instructions (Signed)
Description   Continue taking 1 tablet daily except 2 tablets on Mondays and Fridays.  Recheck in 4 weeks. Please call with any medication changes or if scheduled for any procedures [facility]# [phone]. Main # [phone].

## 2017-12-19 ENCOUNTER — Other Ambulatory Visit: Payer: Self-pay | Admitting: Internal Medicine

## 2017-12-19 MED FILL — FENOFIBRATE 134 MG CAPSULE: 134 | 90 days supply | Qty: 90 | Fill #0

## 2017-12-23 ENCOUNTER — Encounter: Payer: Self-pay | Admitting: Internal Medicine

## 2018-01-04 ENCOUNTER — Encounter: Payer: Self-pay | Admitting: Internal Medicine

## 2018-01-04 NOTE — Progress Notes (Signed)
[facility]   Lucky Cowboy, M.D.     Dyanne Carrel. Steffanie Dunn, P.A.-C Judd Gaudier, DNP [facility]                [address]                [place], [place]. [address] Telephone [phone] Telefax [phone] Annual  Screening/Preventative Visit  & Comprehensive Evaluation & Examination     This very nice 72 y.o. MBM presents for a Screening /Preventative Visit & comprehensive evaluation and management of multiple medical co-morbidities.  Patient has been followed for HTN, HLD, T2_NIDDM and Vitamin D Deficiency. Patient is scheduled for Lt Shoulder surgery on Nov 8 by Dr Ranell Patrick.       HTN predates circa 2005. Patient's BP has been controlled at home.  Today's BP was sl elevated & rechecked at goal: 136/74. Heart cath in 2009 was Negative for CAD. Patient has hx/o unprovoked PE in 2010 and a 2sd PE in 2012 and has been on Coumadin since 2012.   In 2014, he underwent Mitral Valvuloplasty by Dr Barry Dienes.  Patient denies any cardiac symptoms as chest pain, palpitations, shortness of breath, dizziness or ankle swelling.     Patient's hyperlipidemia is not controlled with diet and Zetia & Fenofibrate (Alleges intolerance to Statins & Welchol).  Patient denies myalgias or other medication SE's. Current lipids are not at goal: Lab Results  Component Value Date   CHOL 180 01/05/2018   HDL 55 01/05/2018   LDLCALC 110 (H) 01/05/2018   TRIG 68 01/05/2018   CHOLHDL 3.3 01/05/2018      Patient has hx/o T2_NIDDM predating since 2002 & also has CKD3 (GFR 49)  and patient denies reactive hypoglycemic symptoms, visual blurring, diabetic polys or paresthesias. Patient is attempting control with diet & current A1c is not at goal: Lab Results  Component Value Date   HGBA1C 6.4 (H) 01/05/2018   .last3    Finally, patient has history of Vitamin D Deficiency ("30"/2013) and current vitamin D is still sl low (goal is 70-100): Lab Results   Component Value Date   VD25OH 48 01/05/2018   Current Outpatient Medications on File Prior to Visit  Medication Sig  . acetaminophen (TYLENOL) 500 MG tablet Take 500 mg by mouth every 6 (six) hours as needed for pain.  Marland Kitchen amoxicillin (AMOXIL) 500 MG capsule Take 1 capsule (500 mg total) by mouth 3 (three) times daily. (Patient taking differently: Take 500 mg by mouth 3 (three) times daily. Take when going to the dentist)  . benazepril (LOTENSIN) 20 MG tablet TAKE 1 TABLET BY MOUTH ONCE DAILY  . Cholecalciferol (VITAMIN D PO) Take by mouth daily. TAKING 5,000 UNITS DAILY  . CINNAMON PO Takes 2 tablets (2000mg  ) daily  . ezetimibe (ZETIA) 10 MG tablet TAKE 1 TABLET BY MOUTH DAILY FOR CHOLESTEROL  . fenofibrate micronized (LOFIBRA) 134 MG capsule TAKE 1 CAPSULE BY MOUTH EVERY EVENING.  Marland Kitchen loratadine (CLARITIN) 10 MG tablet Take 10 mg by mouth daily as needed for allergies.  . mometasone (NASONEX) 50 MCG/ACT nasal spray Place 2 sprays into the nose daily as needed (allergies).   . Psyllium ([employer] NATURAL DAILY FIBER PO) Take 1 tablet by mouth daily.  . tamsulosin (FLOMAX) 0.4 MG CAPS capsule Take 1 capsule at bedtime for prostate  . warfarin (COUMADIN) 5 MG tablet TAKE AS DIRECTED BY COUMADIN CLINIC.   No current facility-administered medications on file prior to  visit.    Allergies  Allergen Reactions  . Statins     REACTION: muscle aches  . Welchol [Colesevelam Hcl]     unknown   Past Medical History:  Diagnosis Date  . Allergy    SEASONAL  . Cataract    SMALL IN LEFT EYE  . CKD (chronic kidney disease), stage III (HCC)   . Coronary artery disease    a. Cath 2014 - mild nonobstructive CAD - 40% prox LAD, 30% OM2, otherwise mild irregularities.  . Degenerative joint disease   . Hyperlipidemia   . Hypertension   . Inguinal hernia    right  . Osteoarthritis   . Pre-diabetes   . Pulmonary embolism [facility])    march 2010, 2012              /   DVT x  2  LEFT 2010  . S/P mitral valve  repair 08/15/2012   Complex valvuloplasty including triangular resection of posterior leaflet, artificial Gore-tex neocord placement x4 and Sorin Memo 3D ring annuloplasty via right mini thoracotomy approach  . Seasonal allergies   . Severe mitral regurgitation by prior echocardiogram    a. s/p MV repair 2014.  . Sleep apnea    STOP BANG SCORE 4  . Vitamin D deficiency   . Wears glasses    Health Maintenance  Topic Date Due  . Hepatitis C Screening  [birth year]  . HEMOGLOBIN A1C  07/07/2018  . OPHTHALMOLOGY EXAM  10/11/2018  . FOOT EXAM  01/05/2019  . COLONOSCOPY  07/11/2024  . TETANUS/TDAP  07/24/2027  . INFLUENZA VACCINE  Completed  . PNA vac Low Risk Adult  Completed   Immunization History  Administered Date(s) Administered  . Influenza, High Dose Seasonal PF 12/20/2013, 01/14/2015, 01/06/2016, 12/28/2016, 01/05/2018  . Influenza-Unspecified 01/20/2013  . Pneumococcal Conjugate-13 02/23/2016  . Pneumococcal-Unspecified 05/11/2012  . Td 11/22/2003  . Tdap 07/23/2017   Last Colon - 07/12/2014 - Dr Marina Goodell - Diverticulosis . Recommended 10 yr f/u - due Apr 2026.  Past Surgical History:  Procedure Laterality Date  . achillis tendon     repair 20 years ago  . CARDIAC CATHETERIZATION    . COLONOSCOPY    . COLONOSCOPY W/ BIOPSIES AND POLYPECTOMY    . HIP ARTHROPLASTY     11/06/07  . INGUINAL HERNIA REPAIR Right 11/07/2015   Procedure: LAPAROSCOPIC RIGHT INGUINAL HERNIA WITH MESH;  Surgeon: Axel Filler, MD;  Location[facility] OR;  Service: General;  Laterality: Right;  . INSERTION OF MESH Right 11/07/2015   Procedure: INSERTION OF MESH;  Surgeon: Axel Filler, MD;  Location: [facility] OR;  Service: General;  Laterality: Right;  . INTRAOPERATIVE TRANSESOPHAGEAL ECHOCARDIOGRAM N/A 08/15/2012   Procedure: INTRAOPERATIVE TRANSESOPHAGEAL ECHOCARDIOGRAM;  Surgeon: Purcell Nails, MD;  Location: [facility] OR;  Service: Open Heart Surgery;  Laterality: N/A;  . MITRAL VALVE REPAIR Right 08/15/2012    Procedure: MINIMALLY INVASIVE MITRAL VALVE REPAIR (MVR);  Surgeon: Purcell Nails, MD;  Location: [facility] OR;  Service: Open Heart Surgery;  Laterality: Right;  . TEE WITHOUT CARDIOVERSION N/A 07/04/2012   Procedure: TRANSESOPHAGEAL ECHOCARDIOGRAM (TEE);  Surgeon: Laurey Morale, MD;  Location: [facility] ENDOSCOPY;  Service: Cardiovascular;  Laterality: N/A;  . TOTAL HIP ARTHROPLASTY  08/24/2011   Procedure: TOTAL HIP ARTHROPLASTY ANTERIOR APPROACH;  Surgeon: Shelda Pal, MD;  Location: [facility] ORS;  Service: Orthopedics;  Laterality: Right;   Family History  Problem Relation Age of Onset  . Heart attack Father   . Hypertension  Father   . Coronary artery disease Unknown   . Heart attack Brother 60  . Hypertension Sister   . Hypertension Brother   . Hyperlipidemia Sister   . Diabetes Sister   . Diabetes Brother    Social History   Socioeconomic History  . Marital status: Married    Spouse name: Rinaldo Cloud  . Number of children: 3  Occupational History  . Occupation: Marketing executive: [employer]  Tobacco Use  . Smoking status: Former Smoker    Types: Cigars    Last attempt to quit: 03/22/1973    Years since quitting: 44.8  . Smokeless tobacco: Current User    Types: Chew  . Tobacco comment: uses smokeless tobacco  Substance and Sexual Activity  . Alcohol use: No    Alcohol/week: 0.0 standard drinks  . Drug use: No  . Sexual activity: Not Currently    ROS Constitutional: Denies fever, chills, weight loss/gain, headaches, insomnia,  night sweats or change in appetite. Does c/o fatigue. Eyes: Denies redness, blurred vision, diplopia, discharge, itchy or watery eyes.  ENT: Denies discharge, congestion, post nasal drip, epistaxis, sore throat, earache, hearing loss, dental pain, Tinnitus, Vertigo, Sinus pain or snoring.  Cardio: Denies chest pain, palpitations, irregular heartbeat, syncope, dyspnea, diaphoresis, orthopnea, PND, claudication or edema Respiratory: denies  cough, dyspnea, DOE, pleurisy, hoarseness, laryngitis or wheezing.  Gastrointestinal: Denies dysphagia, heartburn, reflux, water brash, pain, cramps, nausea, vomiting, bloating, diarrhea, constipation, hematemesis, melena, hematochezia, jaundice or hemorrhoids Genitourinary: Denies dysuria, frequency, urgency, nocturia, hesitancy, discharge, hematuria or flank pain Musculoskeletal: Denies arthralgia, myalgia, stiffness, Jt. Swelling, pain, limp or strain/sprain. Denies Falls. Skin: Denies puritis, rash, hives, warts, acne, eczema or change in skin lesion Neuro: No weakness, tremor, incoordination, spasms, paresthesia or pain Psychiatric: Denies confusion, memory loss or sensory loss. Denies Depression. Endocrine: Denies change in weight, skin, hair change, nocturia, and paresthesia, diabetic polys, visual blurring or hyper / hypo glycemic episodes.  Heme/Lymph: No excessive bleeding, bruising or enlarged lymph nodes.  Physical Exam  BP 136/74   Pulse 68   Temp (!) 97.2 F (36.2 C)   Resp 16   Ht 5' 11.75" (1.822 m)   Wt 217 lb 9.6 oz (98.7 kg)   BMI 29.72 kg/m   General Appearance: Well nourished and well groomed and in no apparent distress.  Eyes: PERRLA, EOMs, conjunctiva no swelling or erythema, normal fundi and vessels. Sinuses: No frontal/maxillary tenderness ENT/Mouth: EACs patent / TMs  nl. Nares clear without erythema, swelling, mucoid exudates. Oral hygiene is good. No erythema, swelling, or exudate. Tongue normal, non-obstructing. Tonsils not swollen or erythematous. Hearing normal.  Neck: Supple, thyroid not palpable. No bruits, nodes or JVD. Respiratory: Respiratory effort normal.  BS equal and clear bilateral without rales, rhonci, wheezing or stridor. Cardio: Heart sounds are normal with regular rate and rhythm and no murmurs, rubs or gallops. Peripheral pulses are normal and equal bilaterally without edema. No aortic or femoral bruits. Chest: symmetric with normal  excursions and percussion.  Abdomen: Soft, with Nl bowel sounds. Nontender, no guarding, rebound, hernias, masses, or organomegaly.  Lymphatics: Non tender without lymphadenopathy.  Genitourinary: DRE - deferred Musculoskeletal:  Decreased ROM Lt Shoulder due to pain.  Normal gait. Skin: Warm and dry without rashes, lesions, cyanosis, clubbing or  ecchymosis.  Neuro: Cranial nerves intact, reflexes equal bilaterally. Normal muscle tone, no cerebellar symptoms. Sensation intact to touch, vibratory and Monofilament to the toes bilaterally.  Pysch: Alert and oriented X 3  with normal affect, insight and judgment appropriate.   Assessment and Plan  1. Annual Preventative/Screening Exam   2. Essential hypertension  - EKG 12-Lead - [place], RETROPERITNL ABD,  LTD - Urinalysis, Routine w reflex microscopic - Microalbumin / creatinine urine ratio - CBC with Differential/Platelet - COMPLETE METABOLIC PANEL WITH GFR - Magnesium - TSH  3. Hyperlipidemia, mixed  - EKG 12-Lead - [place], RETROPERITNL ABD,  LTD - Lipid panel - TSH  4. Type 2 diabetes mellitus with stage 3 chronic kidney disease, without long-term current use of insulin (HCC)  - EKG 12-Lead - [place], RETROPERITNL ABD,  LTD - Urinalysis, Routine w reflex microscopic - Microalbumin / creatinine urine ratio - HM DIABETES FOOT EXAM - LOW EXTREMITY NEUR EXAM DOCUM - Hemoglobin A1c - Insulin, random  5. Vitamin D deficiency  - VITAMIN D 25 Hydroxyl  6. S/P mitral valve repair  - EKG 12-Lead  7. CKD (chronic kidney disease) stage 3, GFR 30-59 ml/min (HCC)  - Urinalysis, Routine w reflex microscopic - Microalbumin / creatinine urine ratio - COMPLETE METABOLIC PANEL WITH GFR  8. BPH with obstruction/lower urinary tract symptoms  - Urinalysis, Routine w reflex microscopic - PSA  9. Screening for colorectal cancer  - POC Hemoccult Bld/Stl  10. Prostate cancer screening  - PSA  11. Screening for ischemic heart disease  -  EKG 12-Lead  12. FHx: heart disease  - EKG 12-Lead - [place], RETROPERITNL ABD,  LTD  13. Former smoker  - EKG 12-Lead - [place], RETROPERITNL ABD,  LTD  14. Screening for AAA (aortic abdominal aneurysm)  - [place], RETROPERITNL ABD,  LTD  15. Fatigue, unspecified type  - Iron,Total/Total Iron Binding Cap - Vitamin B12 - Testosterone - CBC with Differential/Platelet - TSH  16. Medication management  - Urinalysis, Routine w reflex microscopic - Microalbumin / creatinine urine ratio - CBC with Differential/Platelet - COMPLETE METABOLIC PANEL WITH GFR - Magnesium - Lipid panel - TSH - Hemoglobin A1c - Insulin, random - VITAMIN D 25 Hydroxy l  17. Need for prophylactic vaccination and inoculation against influenza  - Flu vaccine HIGH DOSE PF (Fluzone High dose)     Patient is felt stable medically for planned shoulder surgery with recommendation for perioperative glucose monitoring as standard w/Diabetic patients. Defer coumadin management to the [facility] which manages his Coumadin.         Patient was counseled in prudent diet, weight control to achieve/maintain BMI less than 25, BP monitoring, regular exercise and medications as discussed.  Discussed med effects and SE's. Routine screening labs and tests as requested with regular follow-up as recommended. Over 40 minutes of exam, counseling, chart review and high complex critical decision making was performed

## 2018-01-04 NOTE — Patient Instructions (Signed)

## 2018-01-05 ENCOUNTER — Encounter: Payer: Self-pay | Admitting: Internal Medicine

## 2018-01-05 ENCOUNTER — Ambulatory Visit (INDEPENDENT_AMBULATORY_CARE_PROVIDER_SITE_OTHER): Payer: 59 | Admitting: Internal Medicine

## 2018-01-05 VITALS — BP 136/74 | HR 68 | Temp 97.2°F | Resp 16 | Ht 71.75 in | Wt 217.6 lb

## 2018-01-05 DIAGNOSIS — N401 Enlarged prostate with lower urinary tract symptoms: Secondary | ICD-10-CM

## 2018-01-05 DIAGNOSIS — Z125 Encounter for screening for malignant neoplasm of prostate: Secondary | ICD-10-CM

## 2018-01-05 DIAGNOSIS — E782 Mixed hyperlipidemia: Secondary | ICD-10-CM | POA: Diagnosis not present

## 2018-01-05 DIAGNOSIS — Z79899 Other long term (current) drug therapy: Secondary | ICD-10-CM | POA: Diagnosis not present

## 2018-01-05 DIAGNOSIS — Z0000 Encounter for general adult medical examination without abnormal findings: Secondary | ICD-10-CM | POA: Diagnosis not present

## 2018-01-05 DIAGNOSIS — Z0001 Encounter for general adult medical examination with abnormal findings: Secondary | ICD-10-CM

## 2018-01-05 DIAGNOSIS — Z136 Encounter for screening for cardiovascular disorders: Secondary | ICD-10-CM

## 2018-01-05 DIAGNOSIS — N138 Other obstructive and reflux uropathy: Secondary | ICD-10-CM | POA: Diagnosis not present

## 2018-01-05 DIAGNOSIS — E559 Vitamin D deficiency, unspecified: Secondary | ICD-10-CM

## 2018-01-05 DIAGNOSIS — Z1212 Encounter for screening for malignant neoplasm of rectum: Secondary | ICD-10-CM

## 2018-01-05 DIAGNOSIS — E1122 Type 2 diabetes mellitus with diabetic chronic kidney disease: Secondary | ICD-10-CM

## 2018-01-05 DIAGNOSIS — Z23 Encounter for immunization: Secondary | ICD-10-CM

## 2018-01-05 DIAGNOSIS — R5383 Other fatigue: Secondary | ICD-10-CM

## 2018-01-05 DIAGNOSIS — Z8249 Family history of ischemic heart disease and other diseases of the circulatory system: Secondary | ICD-10-CM

## 2018-01-05 DIAGNOSIS — Z87891 Personal history of nicotine dependence: Secondary | ICD-10-CM

## 2018-01-05 DIAGNOSIS — N183 Chronic kidney disease, stage 3 unspecified: Secondary | ICD-10-CM

## 2018-01-05 DIAGNOSIS — Z9889 Other specified postprocedural states: Secondary | ICD-10-CM

## 2018-01-05 DIAGNOSIS — Z1211 Encounter for screening for malignant neoplasm of colon: Secondary | ICD-10-CM

## 2018-01-05 DIAGNOSIS — I10 Essential (primary) hypertension: Secondary | ICD-10-CM | POA: Diagnosis not present

## 2018-01-06 LAB — URINALYSIS, ROUTINE W REFLEX MICROSCOPIC
Bilirubin Urine: NEGATIVE
GLUCOSE, UA: NEGATIVE
HGB URINE DIPSTICK: NEGATIVE
Ketones, ur: NEGATIVE
LEUKOCYTES UA: NEGATIVE
Nitrite: NEGATIVE
PROTEIN: NEGATIVE
Specific Gravity, Urine: 1.018 (ref 1.001–1.03)

## 2018-01-06 LAB — COMPLETE METABOLIC PANEL WITHOUT GFR
AG Ratio: 2 (calc) (ref 1.0–2.5)
ALT: 20 U/L (ref 9–46)
AST: 27 U/L (ref 10–35)
Albumin: 4.8 g/dL (ref 3.6–5.1)
Alkaline phosphatase (APISO): 41 U/L (ref 40–115)
BUN/Creatinine Ratio: 13 (calc) (ref 6–22)
BUN: 21 mg/dL (ref 7–25)
CO2: 25 mmol/L (ref 20–32)
Calcium: 9.8 mg/dL (ref 8.6–10.3)
Chloride: 105 mmol/L (ref 98–110)
Creat: 1.6 mg/dL — ABNORMAL HIGH (ref 0.70–1.18)
GFR, Est African American: 49 mL/min/{1.73_m2} — ABNORMAL LOW
GFR, Est Non African American: 43 mL/min/{1.73_m2} — ABNORMAL LOW
Globulin: 2.4 g/dL (ref 1.9–3.7)
Glucose, Bld: 112 mg/dL — ABNORMAL HIGH (ref 65–99)
Potassium: 4.3 mmol/L (ref 3.5–5.3)
Sodium: 139 mmol/L (ref 135–146)
Total Bilirubin: 0.7 mg/dL (ref 0.2–1.2)
Total Protein: 7.2 g/dL (ref 6.1–8.1)

## 2018-01-06 LAB — IRON, TOTAL/TOTAL IRON BINDING CAP
%SAT: 39 % (ref 20–48)
IRON: 138 ug/dL (ref 50–180)
TIBC: 353 mcg/dL (calc) (ref 250–425)

## 2018-01-06 LAB — CBC WITH DIFFERENTIAL/PLATELET
Basophils Absolute: 70 cells/uL (ref 0–200)
Basophils Relative: 1.8 %
EOS ABS: 70 {cells}/uL (ref 15–500)
Eosinophils Relative: 1.8 %
HEMATOCRIT: 45.1 % (ref 38.5–50.0)
HEMOGLOBIN: 15.3 g/dL (ref 13.2–17.1)
LYMPHS ABS: 1459 {cells}/uL (ref 850–3900)
MCH: 30.6 pg (ref 27.0–33.0)
MCHC: 33.9 g/dL (ref 32.0–36.0)
MCV: 90.2 fL (ref 80.0–100.0)
MPV: 12.3 fL (ref 7.5–12.5)
Monocytes Relative: 6.4 %
Neutro Abs: 2051 cells/uL (ref 1500–7800)
Neutrophils Relative %: 52.6 %
PLATELETS: 236 10*3/uL (ref 140–400)
RBC: 5 10*6/uL (ref 4.20–5.80)
RDW: 13.3 % (ref 11.0–15.0)
TOTAL LYMPHOCYTE: 37.4 %
WBC: 3.9 10*3/uL (ref 3.8–10.8)
WBCMIX: 250 {cells}/uL (ref 200–950)

## 2018-01-06 LAB — HEMOGLOBIN A1C
EAG (MMOL/L): 7.6 (calc)
Hgb A1c MFr Bld: 6.4 % of total Hgb — ABNORMAL HIGH (ref <5.7)
Mean Plasma Glucose: 137 (calc)

## 2018-01-06 LAB — INSULIN, RANDOM: INSULIN: 3.8 u[IU]/mL (ref 2.0–19.6)

## 2018-01-06 LAB — LIPID PANEL
Cholesterol: 180 mg/dL
HDL: 55 mg/dL
LDL Cholesterol (Calc): 110 mg/dL — ABNORMAL HIGH
Non-HDL Cholesterol (Calc): 125 mg/dL
Total CHOL/HDL Ratio: 3.3 (calc)
Triglycerides: 68 mg/dL

## 2018-01-06 LAB — VITAMIN B12: VITAMIN B 12: 496 pg/mL (ref 200–1100)

## 2018-01-06 LAB — MICROALBUMIN / CREATININE URINE RATIO
Creatinine, Urine: 122 mg/dL (ref 20–320)
Microalb Creat Ratio: 56 mcg/mg creat — ABNORMAL HIGH (ref <30)
Microalb, Ur: 6.8 mg/dL

## 2018-01-06 LAB — TESTOSTERONE: Testosterone: 507 ng/dL (ref 250–827)

## 2018-01-06 LAB — VITAMIN D 25 HYDROXY (VIT D DEFICIENCY, FRACTURES): Vit D, 25-Hydroxy: 48 ng/mL (ref 30–100)

## 2018-01-06 LAB — MAGNESIUM: Magnesium: 1.8 mg/dL (ref 1.5–2.5)

## 2018-01-06 LAB — TSH: TSH: 1.22 m[IU]/L (ref 0.40–4.50)

## 2018-01-06 LAB — PSA: PSA: 0.5 ng/mL

## 2018-01-08 ENCOUNTER — Encounter: Payer: Self-pay | Admitting: Internal Medicine

## 2018-01-10 ENCOUNTER — Ambulatory Visit (INDEPENDENT_AMBULATORY_CARE_PROVIDER_SITE_OTHER): Payer: 59 | Admitting: *Deleted

## 2018-01-10 DIAGNOSIS — Z7901 Long term (current) use of anticoagulants: Secondary | ICD-10-CM

## 2018-01-10 DIAGNOSIS — Z9889 Other specified postprocedural states: Secondary | ICD-10-CM | POA: Diagnosis not present

## 2018-01-10 DIAGNOSIS — Z5181 Encounter for therapeutic drug level monitoring: Secondary | ICD-10-CM | POA: Diagnosis not present

## 2018-01-10 DIAGNOSIS — I2699 Other pulmonary embolism without acute cor pulmonale: Secondary | ICD-10-CM | POA: Diagnosis not present

## 2018-01-10 LAB — POCT INR: INR: 2.3 (ref 2.0–3.0)

## 2018-01-10 MED ORDER — ENOXAPARIN SODIUM 150 MG/ML ~~LOC~~ SOLN: 150.0000 mg | SUBCUTANEOUS | 0 refills | Status: DC

## 2018-01-10 MED FILL — ENOXAPARIN 150 MG/ML SYR: 150 | 20 days supply | Qty: 20 | Fill #0

## 2018-01-10 NOTE — Patient Instructions (Addendum)
01/21/18- Last dose of Coumadin.  01/22/18- No Coumadin or Lovenox.  01/23/18- Inject Lovenox 150 mg in the fatty abdominal tissue at least 2 inches from the belly button once a day about 24 hours apart at 8pm.  rotate sites. No Coumadin.  01/24/18- Inject Lovenox in the fatty tissue every 24 hours at 8pm. No Coumadin.  01/25/18- Inject Lovenox in the fatty tissue every 24 hours at  8pm. No Coumadin.  01/26/18- Inject Lovenox in the fatty tissue  at 8pm. No Coumadin.  01/27/18- Procedure Day - No Lovenox - Resume Coumadin in the evening or as directed by doctor (take an extra half tablet with usual dose for 2 days then resume normal dose).  01/28/18- Resume Lovenox inject in the fatty tissue every 24 hours at 8am and take Coumadin. (extra 1/2 pill).   01/29/18-Inject Lovenox in the fatty tissue every 24 hours at 8am and take Coumadin.  01/30/18-Inject Lovenox in the fatty tissue every 24 hours at 8am and take Coumadin.  01/31/18- Inject Lovenox in the fatty tissue every 24 hours at 8am and take Coumadin.  02/01/18- Inject Lovenox in the fatty tissue every 24 hours at 8am and take Coumadin.  02/02/18- Coumadin appt to check INR.

## 2018-01-17 NOTE — H&P (Signed)
Patient's anticipated LOS is less than 2 midnights, meeting these requirements: - Younger than 12 - Lives within 1 hour of care - Has a competent adult at home to recover with post-op recover - NO history of  - Chronic pain requiring opiods  - Diabetes  - Coronary Artery Disease  - Heart failure  - Heart attack  - Stroke  - DVT/VTE  - Cardiac arrhythmia  - Respiratory Failure/COPD  - Renal failure  - Anemia  - Advanced Liver disease       [name] is an 72 y.o. male.    Chief Complaint: left shoulder pain  HPI: Pt is a 72 y.o. male complaining of left shoulder pain for multiple years. Pain had continually increased since the beginning. X-rays in the clinic show end-stage arthritic changes of the left shoulder. Pt has tried various conservative treatments which have failed to alleviate their symptoms, including injections and therapy. Various options are discussed with the patient. Risks, benefits and expectations were discussed with the patient. Patient understand the risks, benefits and expectations and wishes to proceed with surgery.   PCP:  Lucky Cowboy, MD  D/C Plans: Home  PMH: Past Medical History:  Diagnosis Date  . Allergy    SEASONAL  . Cataract    SMALL IN LEFT EYE  . CKD (chronic kidney disease), stage III (HCC)   . Coronary artery disease    a. Cath 2014 - mild nonobstructive CAD - 40% prox LAD, 30% OM2, otherwise mild irregularities.  . Degenerative joint disease   . Hyperlipidemia   . Hypertension   . Inguinal hernia    right  . Osteoarthritis   . Pre-diabetes   . Pulmonary embolism [facility])    march 2010, 2012              /   DVT x  2  LEFT 2010  . S/P mitral valve repair 08/15/2012   Complex valvuloplasty including triangular resection of posterior leaflet, artificial Gore-tex neocord placement x4 and Sorin Memo 3D ring annuloplasty via right mini thoracotomy approach  . Seasonal allergies   . Severe mitral regurgitation by prior  echocardiogram    a. s/p MV repair 2014.  . Sleep apnea    STOP BANG SCORE 4  . Vitamin D deficiency   . Wears glasses     PSH: Past Surgical History:  Procedure Laterality Date  . achillis tendon     repair 20 years ago  . CARDIAC CATHETERIZATION    . COLONOSCOPY    . COLONOSCOPY W/ BIOPSIES AND POLYPECTOMY    . HIP ARTHROPLASTY     11/06/07  . INGUINAL HERNIA REPAIR Right 11/07/2015   Procedure: LAPAROSCOPIC RIGHT INGUINAL HERNIA WITH MESH;  Surgeon: Axel Filler, MD;  Location[facility] OR;  Service: General;  Laterality: Right;  . INSERTION OF MESH Right 11/07/2015   Procedure: INSERTION OF MESH;  Surgeon: Axel Filler, MD;  Location: [facility] OR;  Service: General;  Laterality: Right;  . INTRAOPERATIVE TRANSESOPHAGEAL ECHOCARDIOGRAM N/A 08/15/2012   Procedure: INTRAOPERATIVE TRANSESOPHAGEAL ECHOCARDIOGRAM;  Surgeon: Purcell Nails, MD;  Location: [facility] OR;  Service: Open Heart Surgery;  Laterality: N/A;  . MITRAL VALVE REPAIR Right 08/15/2012   Procedure: MINIMALLY INVASIVE MITRAL VALVE REPAIR (MVR);  Surgeon: Purcell Nails, MD;  Location: [facility] OR;  Service: Open Heart Surgery;  Laterality: Right;  . TEE WITHOUT CARDIOVERSION N/A 07/04/2012   Procedure: TRANSESOPHAGEAL ECHOCARDIOGRAM (TEE);  Surgeon: Laurey Morale, MD;  Location: [facility] ENDOSCOPY;  Service: Cardiovascular;  Laterality: N/A;  . TOTAL HIP ARTHROPLASTY  08/24/2011   Procedure: TOTAL HIP ARTHROPLASTY ANTERIOR APPROACH;  Surgeon: Shelda Pal, MD;  Location: [facility] ORS;  Service: Orthopedics;  Laterality: Right;    Social History:  reports that he quit smoking about 44 years ago. His smoking use included cigars. His smokeless tobacco use includes chew. He reports that he does not drink alcohol or use drugs.  Allergies:  Allergies  Allergen Reactions  . Statins     muscle aches  . Welchol [Colesevelam Hcl]     Muscle weakness    Medications: No current facility-administered medications for this encounter.    Current  Outpatient Medications  Medication Sig Dispense Refill  . acetaminophen (TYLENOL) 500 MG tablet Take 1,000 mg by mouth every 6 (six) hours as needed for moderate pain or headache.     Marland Kitchen amoxicillin (AMOXIL) 500 MG capsule Take 1 capsule (500 mg total) by mouth 3 (three) times daily. (Patient taking differently: Take 2,000 mg by mouth See admin instructions. Take 1 hour prior to going to the dentist) 21 capsule 0  . Artificial Tear Solution (SOOTHE XP OP) Place 1 drop into both eyes daily as needed (dry eyes).    . benazepril (LOTENSIN) 20 MG tablet TAKE 1 TABLET BY MOUTH ONCE DAILY (Patient taking differently: Take 20 mg by mouth at bedtime. ) 90 tablet 1  . bisacodyl (DULCOLAX) 5 MG EC tablet Take 10 mg by mouth daily as needed for moderate constipation.    . Cholecalciferol (VITAMIN D3) 5000 units CAPS Take 5,000 Units by mouth at bedtime.    Marland Kitchen CINNAMON PO Take 2,000 mg by mouth daily. Takes 2 tablets (2000mg  ) daily     . diclofenac sodium (VOLTAREN) 1 % GEL Apply 1 application topically 4 (four) times daily as needed for pain.  4  . ezetimibe (ZETIA) 10 MG tablet TAKE 1 TABLET BY MOUTH DAILY FOR CHOLESTEROL (Patient taking differently: Take 10 mg by mouth at bedtime. ) 90 tablet 1  . fenofibrate micronized (LOFIBRA) 134 MG capsule TAKE 1 CAPSULE BY MOUTH EVERY EVENING. (Patient taking differently: Take 134 mg by mouth at bedtime. ) 90 capsule 3  . loratadine (CLARITIN) 10 MG tablet Take 10 mg by mouth daily as needed for allergies.    . mometasone (NASONEX) 50 MCG/ACT nasal spray Place 2 sprays into the nose daily as needed (allergies).     Marland Kitchen OVER THE COUNTER MEDICATION Apply 1 application topically daily as needed (joint pain). Glucosamine Chondroitin MSM topical cream    . tamsulosin (FLOMAX) 0.4 MG CAPS capsule Take 1 capsule at bedtime for prostate (Patient taking differently: Take 0.4 mg by mouth at bedtime. ) 30 capsule 1  . warfarin (COUMADIN) 5 MG tablet TAKE AS DIRECTED BY COUMADIN  CLINIC. (Patient taking differently: Take 5-10 mg by mouth See admin instructions. Take 10 mg at night on Mon and Fri, take 5 mg at night on Tues, Wed, Thurs, Sat and Sun,) 150 tablet 1  . enoxaparin (LOVENOX) 150 MG/ML injection Inject 1 mL (150 mg total) into the skin daily. 20 Syringe 0    No results found for this or any previous visit (from the past 48 hour(s)). No results found.  ROS: Pain with rom of the left upper extremity  Physical Exam: Alert and oriented 72 y.o. male in no acute distress Cranial nerves 2-12 intact Cervical spine: full rom with no tenderness, nv intact distally Chest: active breath sounds bilaterally, no  wheeze rhonchi or rales Heart: regular rate and rhythm, no murmur Abd: non tender non distended with active bowel sounds Hip is stable with rom  Left shoulder pain and weakness with ER/IR No rashes or edema Strength limited nv intact distally  Assessment/Plan Assessment: left shoulder cuff tear  Plan:  Patient will undergo a left shoulder rotator cuff repair by Dr. Ranell Patrick at [facility]. Risks benefits and expectations were discussed with the patient. Patient understand risks, benefits and expectations and wishes to proceed. Preoperative templating of the joint replacement has been completed, documented, and submitted to the Operating Room personnel in order to optimize intra-operative equipment management.   Alphonsa Overall PA-C, MPAS [facility] is now [employer] [address], [place], [place] [address] Phone: [phone] [email or web address] Facebook  [employer]

## 2018-01-18 NOTE — Pre-Procedure Instructions (Signed)
TRAVONE GEORG  01/18/2018      [name] - [place], [place] - [address] [facility]. [address] [place] [address] Phone: [phone] Fax: [phone]  [id number] - [place], [place] - [address] AT [address] [place] [address] Phone: [phone] Fax: [phone]    Your procedure is scheduled on Fri. Nov. 8, 2019 from 10:00AM-12:30PM  Report to [facility] Admitting Entrance "A" at 8:00AM  Call this number if you have problems the morning of surgery:  [phone]   Remember:  Do not eat or drink after midnight on Nov. 7th    Take these medicines the morning of surgery with A SIP OF WATER: If needed: Acetaminophen (TYLENOL), Artificial Tear Solution, and Loratadine (CLARITIN)   Stop taking Coumadin 5 days (01/21/18) before surgery, and follow the Lovenox bridge instructions as directed.  7 days before surgery (01/20/18), stop taking all Other Aspirin Products, Vitamins, Fish oils, and Herbal medications. Also stop all NSAIDS i.e. Advil, Ibuprofen, Motrin, Aleve, Anaprox, Naproxen, BC, Goody Powders, and all Supplements.    Do not wear jewelry.  Do not wear lotions, powders, colognes, or deodorant.  Do not shave 48 hours prior to surgery.  Men may shave face.  Do not bring valuables to the hospital.  [facility] is not responsible for any belongings or valuables.  Contacts, dentures or bridgework may not be worn into surgery.  Leave your suitcase in the car.  After surgery it may be brought to your room.  For patients admitted to the hospital, discharge time will be determined by your treatment team.  Patients discharged the day of surgery will not be allowed to drive home.   Special instructions [place]- Preparing For Surgery  Before surgery, you can play an important role. Because skin is not sterile, your skin needs to be as free of germs as  possible. You can reduce the number of germs on your skin by washing with CHG (chlorahexidine gluconate) Soap before surgery.  CHG is an antiseptic cleaner which kills germs and bonds with the skin to continue killing germs even after washing.    Oral Hygiene is also important to reduce your risk of infection.  Remember - BRUSH YOUR TEETH THE MORNING OF SURGERY WITH YOUR REGULAR TOOTHPASTE  Please do not use if you have an allergy to CHG or antibacterial soaps. If your skin becomes reddened/irritated stop using the CHG.  Do not shave (including legs and underarms) for at least 48 hours prior to first CHG shower. It is OK to shave your face.  Please follow these instructions carefully.   1. Shower the NIGHT BEFORE SURGERY and the MORNING OF SURGERY with CHG.   2. If you chose to wash your hair, wash your hair first as usual with your normal shampoo.  3. After you shampoo, rinse your hair and body thoroughly to remove the shampoo.  4. Use CHG as you would any other liquid soap. You can apply CHG directly to the skin and wash gently with a scrungie or a clean washcloth.   5. Apply the CHG Soap to your body ONLY FROM THE NECK DOWN.  Do not use on open wounds or open sores. Avoid contact with your eyes, ears, mouth and genitals (private parts). Wash Face and genitals (private parts)  with your normal soap.  6. Wash thoroughly, paying special attention to the area where your surgery will  be performed.  7. Thoroughly rinse your body with warm water from the neck down.  8. DO NOT shower/wash with your normal soap after using and rinsing off the CHG Soap.  9. Pat yourself dry with a CLEAN TOWEL.  10. Wear CLEAN PAJAMAS to bed the night before surgery, wear comfortable clothes the morning of surgery  11. Place CLEAN SHEETS on your bed the night of your first shower and DO NOT SLEEP WITH PETS.  Day of Surgery:  Do not apply any deodorants/lotions.  Please wear clean clothes to the  hospital/surgery center.   Remember to brush your teeth WITH YOUR REGULAR TOOTHPASTE.  Please read over the following fact sheets that you were given. Pain Booklet, Coughing and Deep Breathing and Surgical Site Infection Prevention

## 2018-01-19 ENCOUNTER — Encounter (HOSPITAL_COMMUNITY): Payer: Self-pay

## 2018-01-19 ENCOUNTER — Encounter (HOSPITAL_COMMUNITY)
Admission: RE | Admit: 2018-01-19 | Discharge: 2018-01-19 | Disposition: A | Discharge status: Home / Self Care | Payer: Medicare Other | Source: Ambulatory Visit | Attending: Orthopedic Surgery | Admitting: Orthopedic Surgery

## 2018-01-19 DIAGNOSIS — Z01812 Encounter for preprocedural laboratory examination: Secondary | ICD-10-CM | POA: Insufficient documentation

## 2018-01-19 HISTORY — DX: Headache: R51

## 2018-01-19 HISTORY — DX: Headache, unspecified: R51.9

## 2018-01-19 LAB — GLUCOSE, CAPILLARY: Glucose-Capillary: 131 mg/dL — ABNORMAL HIGH (ref 70–99)

## 2018-01-19 LAB — BASIC METABOLIC PANEL
ANION GAP: 7 (ref 5–15)
BUN: 21 mg/dL (ref 8–23)
CO2: 23 mmol/L (ref 22–32)
Calcium: 9.4 mg/dL (ref 8.9–10.3)
Chloride: 109 mmol/L (ref 98–111)
Creatinine, Ser: 1.52 mg/dL — ABNORMAL HIGH (ref 0.61–1.24)
GFR, EST AFRICAN AMERICAN: 51 mL/min — AB (ref >60)
GFR, EST NON AFRICAN AMERICAN: 44 mL/min — AB (ref >60)
Glucose, Bld: 117 mg/dL — ABNORMAL HIGH (ref 70–99)
POTASSIUM: 3.7 mmol/L (ref 3.5–5.1)
Sodium: 139 mmol/L (ref 135–145)

## 2018-01-19 LAB — CBC
HEMATOCRIT: 46.3 % (ref 39.0–52.0)
Hemoglobin: 15 g/dL (ref 13.0–17.0)
MCH: 29.8 pg (ref 26.0–34.0)
MCHC: 32.4 g/dL (ref 30.0–36.0)
MCV: 91.9 fL (ref 80.0–100.0)
NRBC: 0 % (ref 0.0–0.2)
Platelets: 240 10*3/uL (ref 150–400)
RBC: 5.04 MIL/uL (ref 4.22–5.81)
RDW: 13.2 % (ref 11.5–15.5)
WBC: 4.1 10*3/uL (ref 4.0–10.5)

## 2018-01-19 NOTE — Pre-Procedure Instructions (Signed)
[name]  01/19/2018      [name] - [place], [place] - [address] [facility] [address] [place] [address] Phone: [phone] Fax: [phone]  [id number] - [place], [place] - [address] [place] [address] Phone: [phone] Fax: [phone]    Your procedure is scheduled on Fri. Nov. 8, 2019 from 10:00AM-12:30PM  Report to [facility] Admitting Entrance "A" at 8:00AM  Call this number if you have problems the morning of surgery:  [phone]   Remember:  Do not eat or drink after midnight on Nov. 7th    Take these medicines the morning of surgery with A SIP OF WATER: If needed: Acetaminophen (TYLENOL), Artificial Tear Solution, and Loratadine (CLARITIN)   Stop taking Coumadin 5 days (01/21/18) before surgery, and follow the Lovenox bridge instructions as directed.  7 days before surgery (01/20/18), stop taking all Other Aspirin Products, Vitamins, Fish oils, and Herbal medications. Also stop all NSAIDS i.e. Advil, Ibuprofen, Motrin, Aleve, Anaprox, Naproxen, BC, Goody Powders, and all Supplements.     How to Manage Your Diabetes Before and After Surgery  Why is it important to control my blood sugar before and after surgery? . Improving blood sugar levels before and after surgery helps healing and can limit problems. . A way of improving blood sugar control is eating a healthy diet by: o  Eating less sugar and carbohydrates o  Increasing activity/exercise o  Talking with your doctor about reaching your blood sugar goals . High blood sugars (greater than 180 mg/dL) can raise your risk of infections and slow your recovery, so you will need to focus on controlling your diabetes during the weeks before surgery. . Make sure that the doctor who takes care of your diabetes knows about your planned surgery including the date and  location.  How do I manage my blood sugar before surgery? . Check your blood sugar at least 4 times a day, starting 2 days before surgery, to make sure that the level is not too high or low. o Check your blood sugar the morning of your surgery when you wake up and every 2 hours until you get to the Short Stay unit. . If your blood sugar is less than 70 mg/dL, you will need to treat for low blood sugar: o Do not take insulin. o Treat a low blood sugar (less than 70 mg/dL) with  cup of clear juice (cranberry or apple), 4 glucose tablets, OR glucose gel. Recheck blood sugar in 15 minutes after treatment (to make sure it is greater than 70 mg/dL). If your blood sugar is not greater than 70 mg/dL on recheck, call [phone] o  for further instructions. . Report your blood sugar to the short stay nurse when you get to Short Stay.  . If you are admitted to the hospital after surgery: o Your blood sugar will be checked by the staff and you will probably be given insulin after surgery (instead of oral diabetes medicines) to make sure you have good blood sugar levels. o The goal for blood sugar control after surgery is 80-180 mg/dL.          Do not wear jewelry.  Do not wear lotions, powders, colognes, or deodorant.  Do not shave 48 hours prior to surgery.  Men may shave face.  Do not bring valuables to the hospital.  [facility] is not  responsible for any belongings or valuables.  Contacts, dentures or bridgework may not be worn into surgery.  Leave your suitcase in the car.  After surgery it may be brought to your room.  For patients admitted to the hospital, discharge time will be determined by your treatment team.  Patients discharged the day of surgery will not be allowed to drive home.   Special instructions [place]- Preparing For Surgery  Before surgery, you can play an important role. Because skin is not sterile, your skin needs to be as free of germs as possible. You can  reduce the number of germs on your skin by washing with CHG (chlorahexidine gluconate) Soap before surgery.  CHG is an antiseptic cleaner which kills germs and bonds with the skin to continue killing germs even after washing.    Oral Hygiene is also important to reduce your risk of infection.  Remember - BRUSH YOUR TEETH THE MORNING OF SURGERY WITH YOUR REGULAR TOOTHPASTE  Please do not use if you have an allergy to CHG or antibacterial soaps. If your skin becomes reddened/irritated stop using the CHG.  Do not shave (including legs and underarms) for at least 48 hours prior to first CHG shower. It is OK to shave your face.  Please follow these instructions carefully.   1. Shower the NIGHT BEFORE SURGERY and the MORNING OF SURGERY with CHG.   2. If you chose to wash your hair, wash your hair first as usual with your normal shampoo.  3. After you shampoo, rinse your hair and body thoroughly to remove the shampoo.  4. Use CHG as you would any other liquid soap. You can apply CHG directly to the skin and wash gently with a scrungie or a clean washcloth.   5. Apply the CHG Soap to your body ONLY FROM THE NECK DOWN.  Do not use on open wounds or open sores. Avoid contact with your eyes, ears, mouth and genitals (private parts). Wash Face and genitals (private parts)  with your normal soap.  6. Wash thoroughly, paying special attention to the area where your surgery will be performed.  7. Thoroughly rinse your body with warm water from the neck down.  8. DO NOT shower/wash with your normal soap after using and rinsing off the CHG Soap.  9. Pat yourself dry with a CLEAN TOWEL.  10. Wear CLEAN PAJAMAS to bed the night before surgery, wear comfortable clothes the morning of surgery  11. Place CLEAN SHEETS on your bed the night of your first shower and DO NOT SLEEP WITH PETS.  Day of Surgery:  Do not apply any deodorants/lotions.  Please wear clean clothes to the hospital/surgery center.    Remember to brush your teeth WITH YOUR REGULAR TOOTHPASTE.  Please read over the following fact sheets that you were given. Pain Booklet, Coughing and Deep Breathing and Surgical Site Infection Prevention

## 2018-01-19 NOTE — Progress Notes (Signed)
PCP: Dr. Oneta Rack  Cardiologist: Dr. Clifton James  Pt. States he has instructions at home when to stop coumadin and when to start the lovenox injections. He was instructed if he has any questions to contact Dr. Gibson Ramp office (he gave these instructions) In notes from coumadin clinic, he is to stop coumadin 5 days prior to surgery.
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
## 2018-01-23 ENCOUNTER — Other Ambulatory Visit: Payer: Self-pay | Admitting: *Deleted

## 2018-01-23 MED ORDER — BLOOD GLUCOSE MONITOR KIT: PACK | 0 refills | Status: AC

## 2018-01-23 MED FILL — FREESTYLE LITE TEST STRIP: 90 days supply | Qty: 100 | Fill #0

## 2018-01-23 MED FILL — EZETIMIBE 10 MG TABLET: 10 | 90 days supply | Qty: 90 | Fill #1

## 2018-01-23 MED FILL — FREESTYLE LITE METER: 30 days supply | Qty: 1 | Fill #0

## 2018-01-23 MED FILL — FREESTYLE LANCETS: 90 days supply | Qty: 100 | Fill #0

## 2018-01-27 ENCOUNTER — Ambulatory Visit (HOSPITAL_COMMUNITY): Payer: 59 | Admitting: Vascular Surgery

## 2018-01-27 ENCOUNTER — Encounter (HOSPITAL_COMMUNITY): Payer: Self-pay | Admitting: General Practice

## 2018-01-27 ENCOUNTER — Ambulatory Visit (HOSPITAL_COMMUNITY)
Admission: RE | Admit: 2018-01-27 | Discharge: 2018-01-27 | Disposition: A | Discharge status: Home / Self Care | Payer: 59 | Source: Ambulatory Visit | Attending: Orthopedic Surgery | Admitting: Orthopedic Surgery

## 2018-01-27 ENCOUNTER — Encounter (HOSPITAL_COMMUNITY)
Admission: RE | Disposition: A | Discharge status: Home / Self Care | Payer: Self-pay | Source: Ambulatory Visit | Attending: Orthopedic Surgery

## 2018-01-27 ENCOUNTER — Other Ambulatory Visit: Payer: Self-pay

## 2018-01-27 DIAGNOSIS — I129 Hypertensive chronic kidney disease with stage 1 through stage 4 chronic kidney disease, or unspecified chronic kidney disease: Secondary | ICD-10-CM | POA: Insufficient documentation

## 2018-01-27 DIAGNOSIS — E559 Vitamin D deficiency, unspecified: Secondary | ICD-10-CM | POA: Insufficient documentation

## 2018-01-27 DIAGNOSIS — Z79899 Other long term (current) drug therapy: Secondary | ICD-10-CM | POA: Insufficient documentation

## 2018-01-27 DIAGNOSIS — Z7901 Long term (current) use of anticoagulants: Secondary | ICD-10-CM | POA: Insufficient documentation

## 2018-01-27 DIAGNOSIS — X58XXXA Exposure to other specified factors, initial encounter: Secondary | ICD-10-CM | POA: Insufficient documentation

## 2018-01-27 DIAGNOSIS — Z86711 Personal history of pulmonary embolism: Secondary | ICD-10-CM | POA: Insufficient documentation

## 2018-01-27 DIAGNOSIS — Z791 Long term (current) use of non-steroidal anti-inflammatories (NSAID): Secondary | ICD-10-CM | POA: Diagnosis not present

## 2018-01-27 DIAGNOSIS — E785 Hyperlipidemia, unspecified: Secondary | ICD-10-CM | POA: Insufficient documentation

## 2018-01-27 DIAGNOSIS — I2510 Atherosclerotic heart disease of native coronary artery without angina pectoris: Secondary | ICD-10-CM | POA: Diagnosis not present

## 2018-01-27 DIAGNOSIS — G4730 Sleep apnea, unspecified: Secondary | ICD-10-CM | POA: Diagnosis not present

## 2018-01-27 DIAGNOSIS — M75122 Complete rotator cuff tear or rupture of left shoulder, not specified as traumatic: Secondary | ICD-10-CM | POA: Diagnosis not present

## 2018-01-27 DIAGNOSIS — S43432A Superior glenoid labrum lesion of left shoulder, initial encounter: Secondary | ICD-10-CM | POA: Diagnosis not present

## 2018-01-27 DIAGNOSIS — M75102 Unspecified rotator cuff tear or rupture of left shoulder, not specified as traumatic: Secondary | ICD-10-CM | POA: Insufficient documentation

## 2018-01-27 DIAGNOSIS — N183 Chronic kidney disease, stage 3 (moderate): Secondary | ICD-10-CM | POA: Diagnosis not present

## 2018-01-27 DIAGNOSIS — R7303 Prediabetes: Secondary | ICD-10-CM | POA: Diagnosis not present

## 2018-01-27 DIAGNOSIS — M19012 Primary osteoarthritis, left shoulder: Secondary | ICD-10-CM | POA: Diagnosis not present

## 2018-01-27 DIAGNOSIS — G8918 Other acute postprocedural pain: Secondary | ICD-10-CM | POA: Diagnosis not present

## 2018-01-27 DIAGNOSIS — Z87891 Personal history of nicotine dependence: Secondary | ICD-10-CM | POA: Insufficient documentation

## 2018-01-27 DIAGNOSIS — Z888 Allergy status to other drugs, medicaments and biological substances status: Secondary | ICD-10-CM | POA: Diagnosis not present

## 2018-01-27 DIAGNOSIS — Z86718 Personal history of other venous thrombosis and embolism: Secondary | ICD-10-CM | POA: Insufficient documentation

## 2018-01-27 DIAGNOSIS — S43431A Superior glenoid labrum lesion of right shoulder, initial encounter: Secondary | ICD-10-CM | POA: Diagnosis not present

## 2018-01-27 HISTORY — PX: SHOULDER ARTHROSCOPY WITH ROTATOR CUFF REPAIR AND SUBACROMIAL DECOMPRESSION: SHX5686

## 2018-01-27 LAB — PROTIME-INR
INR: 1.16
Prothrombin Time: 14.7 seconds (ref 11.4–15.2)

## 2018-01-27 SURGERY — SHOULDER ARTHROSCOPY WITH ROTATOR CUFF REPAIR AND SUBACROMIAL DECOMPRESSION
Anesthesia: General | Site: Shoulder | Laterality: Left

## 2018-01-27 MED ORDER — DEXAMETHASONE SODIUM PHOSPHATE 10 MG/ML IJ SOLN
INTRAMUSCULAR | Status: DC | PRN
  Administered 2018-01-27: 4 mg via INTRAVENOUS

## 2018-01-27 MED ORDER — BUPIVACAINE HCL (PF) 0.5 % IJ SOLN
INTRAMUSCULAR | Status: DC | PRN
  Administered 2018-01-27: 10 mL via PERINEURAL

## 2018-01-27 MED ORDER — METHOCARBAMOL 500 MG PO TABS: 500.0000 mg | ORAL_TABLET | Freq: Three times a day (TID) | ORAL | 1 refills | Status: DC | PRN

## 2018-01-27 MED ORDER — PROMETHAZINE HCL 25 MG/ML IJ SOLN: 6.2500 mg | INTRAMUSCULAR | Status: DC | PRN

## 2018-01-27 MED ORDER — SUGAMMADEX SODIUM 200 MG/2ML IV SOLN
INTRAVENOUS | Status: DC | PRN
  Administered 2018-01-27: 400 mg via INTRAVENOUS

## 2018-01-27 MED ORDER — ROCURONIUM BROMIDE 10 MG/ML (PF) SYRINGE
PREFILLED_SYRINGE | INTRAVENOUS | Status: DC | PRN
  Administered 2018-01-27: 50 mg via INTRAVENOUS
  Administered 2018-01-27 (×2): 20 mg via INTRAVENOUS

## 2018-01-27 MED ORDER — DEXAMETHASONE SODIUM PHOSPHATE 10 MG/ML IJ SOLN
INTRAMUSCULAR | Status: AC
  Filled 2018-01-27: qty 1

## 2018-01-27 MED ORDER — FENTANYL CITRATE (PF) 100 MCG/2ML IJ SOLN
INTRAMUSCULAR | Status: DC | PRN
  Administered 2018-01-27: 100 ug via INTRAVENOUS

## 2018-01-27 MED ORDER — ONDANSETRON HCL 4 MG/2ML IJ SOLN
INTRAMUSCULAR | Status: AC
  Filled 2018-01-27: qty 2

## 2018-01-27 MED ORDER — PROPOFOL 10 MG/ML IV BOLUS
INTRAVENOUS | Status: DC | PRN
  Administered 2018-01-27: 90 mg via INTRAVENOUS

## 2018-01-27 MED ORDER — MIDAZOLAM HCL 2 MG/2ML IJ SOLN
INTRAMUSCULAR | Status: AC
  Administered 2018-01-27: 1 mg
  Filled 2018-01-27: qty 2

## 2018-01-27 MED ORDER — HYDROMORPHONE HCL 1 MG/ML IJ SOLN: 0.2500 mg | INTRAMUSCULAR | Status: DC | PRN

## 2018-01-27 MED ORDER — BUPIVACAINE-EPINEPHRINE 0.25% -1:200000 IJ SOLN
INTRAMUSCULAR | Status: DC | PRN
  Administered 2018-01-27: 8 mL

## 2018-01-27 MED ORDER — SODIUM CHLORIDE 0.9 % IV SOLN
INTRAVENOUS | Status: DC | PRN
  Administered 2018-01-27: 30 ug/min via INTRAVENOUS

## 2018-01-27 MED ORDER — LIDOCAINE 2% (20 MG/ML) 5 ML SYRINGE
INTRAMUSCULAR | Status: AC
  Filled 2018-01-27: qty 5

## 2018-01-27 MED ORDER — ONDANSETRON HCL 4 MG PO TABS: 4.0000 mg | ORAL_TABLET | Freq: Three times a day (TID) | ORAL | 0 refills | Status: DC | PRN

## 2018-01-27 MED ORDER — LACTATED RINGERS IV SOLN
INTRAVENOUS | Status: DC
  Administered 2018-01-27: 09:00:00 via INTRAVENOUS

## 2018-01-27 MED ORDER — 0.9 % SODIUM CHLORIDE (POUR BTL) OPTIME
TOPICAL | Status: DC | PRN
  Administered 2018-01-27: 1000 mL

## 2018-01-27 MED ORDER — CEFAZOLIN SODIUM-DEXTROSE 2-4 GM/100ML-% IV SOLN
2.0000 g | INTRAVENOUS | Status: AC
  Administered 2018-01-27: 2 g via INTRAVENOUS
  Filled 2018-01-27: qty 100

## 2018-01-27 MED ORDER — BUPIVACAINE LIPOSOME 1.3 % IJ SUSP
INTRAMUSCULAR | Status: DC | PRN
  Administered 2018-01-27: 10 mL via PERINEURAL

## 2018-01-27 MED ORDER — PROPOFOL 10 MG/ML IV BOLUS
INTRAVENOUS | Status: AC
  Filled 2018-01-27: qty 20

## 2018-01-27 MED ORDER — FENTANYL CITRATE (PF) 100 MCG/2ML IJ SOLN
INTRAMUSCULAR | Status: AC
  Administered 2018-01-27: 100 ug
  Filled 2018-01-27: qty 2

## 2018-01-27 MED ORDER — CHLORHEXIDINE GLUCONATE 4 % EX LIQD: 60.0000 mL | Freq: Once | CUTANEOUS | Status: DC

## 2018-01-27 MED ORDER — OXYCODONE-ACETAMINOPHEN 5-325 MG PO TABS: 1.0000 | ORAL_TABLET | ORAL | 0 refills | Status: DC | PRN

## 2018-01-27 MED ORDER — LIDOCAINE 2% (20 MG/ML) 5 ML SYRINGE
INTRAMUSCULAR | Status: DC | PRN
  Administered 2018-01-27: 80 mg via INTRAVENOUS

## 2018-01-27 MED ORDER — FENTANYL CITRATE (PF) 250 MCG/5ML IJ SOLN
INTRAMUSCULAR | Status: AC
  Filled 2018-01-27: qty 5

## 2018-01-27 MED ORDER — MIDAZOLAM HCL 2 MG/2ML IJ SOLN: 0.5000 mg | Freq: Once | INTRAMUSCULAR | Status: DC | PRN

## 2018-01-27 MED ORDER — STERILE WATER FOR IRRIGATION IR SOLN
Status: DC | PRN
  Administered 2018-01-27: 1000 mL

## 2018-01-27 MED ORDER — ROCURONIUM BROMIDE 50 MG/5ML IV SOSY
PREFILLED_SYRINGE | INTRAVENOUS | Status: AC
  Filled 2018-01-27: qty 5

## 2018-01-27 MED ORDER — BUPIVACAINE-EPINEPHRINE (PF) 0.25% -1:200000 IJ SOLN
INTRAMUSCULAR | Status: AC
  Filled 2018-01-27: qty 30

## 2018-01-27 MED ORDER — LACTATED RINGERS IV SOLN
INTRAVENOUS | Status: DC | PRN
  Administered 2018-01-27: 10:00:00 via INTRAVENOUS

## 2018-01-27 MED ORDER — MEPERIDINE HCL 50 MG/ML IJ SOLN: 6.2500 mg | INTRAMUSCULAR | Status: DC | PRN

## 2018-01-27 MED ORDER — SODIUM CHLORIDE 0.9 % IR SOLN
Status: DC | PRN
  Administered 2018-01-27 (×2): 3000 mL

## 2018-01-27 MED FILL — ONDANSETRON HCL 4 MG TABLET: 4 | 7 days supply | Qty: 20 | Fill #0

## 2018-01-27 MED FILL — METHOCARBAMOL 500 MG TABS: 500 | 20 days supply | Qty: 60 | Fill #0

## 2018-01-27 MED FILL — OXYCODONE-ACETAMINOPHEN 5-3: 5-325 | 4 days supply | Qty: 40 | Fill #0

## 2018-01-27 SURGICAL SUPPLY — 73 items
ANCHOR ALL SUT RC W2 1.3 RIB (Anchor) ×2 IMPLANT
ANCHOR ALL-SUT FLEX 1.3 Y-KNOT (Anchor) ×2 IMPLANT
ANCHOR ALL-SUT RC W2 1.3 RIB (Anchor) ×2 IMPLANT
BIT DRILL 1.3M DISPOSABLE (BIT) ×2 IMPLANT
BLADE LONG MED 31X9 (MISCELLANEOUS) IMPLANT
BLADE SURG 11 STRL SS (BLADE) ×2 IMPLANT
BUR OVAL 4.0 (BURR) ×2 IMPLANT
CLSR STERI-STRIP ANTIMIC 1/2X4 (GAUZE/BANDAGES/DRESSINGS) ×2 IMPLANT
COVER SURGICAL LIGHT HANDLE (MISCELLANEOUS) ×2 IMPLANT
COVER WAND RF STERILE (DRAPES) ×2 IMPLANT
DRAPE INCISE IOBAN 66X45 STRL (DRAPES) ×2 IMPLANT
DRAPE ORTHO SPLIT 77X108 STRL (DRAPES) ×2
DRAPE STERI 35X30 U-POUCH (DRAPES) ×2 IMPLANT
DRAPE SURG ORHT 6 SPLT 77X108 (DRAPES) ×2 IMPLANT
DRAPE U-SHAPE 47X51 STRL (DRAPES) ×2 IMPLANT
DRILL BIT 5/64 (BIT) ×2 IMPLANT
DRSG EMULSION OIL 3X3 NADH (GAUZE/BANDAGES/DRESSINGS) ×4 IMPLANT
DRSG PAD ABDOMINAL 8X10 ST (GAUZE/BANDAGES/DRESSINGS) ×4 IMPLANT
DURAPREP 26ML APPLICATOR (WOUND CARE) ×2 IMPLANT
ELECT NEEDLE TIP 2.8 STRL (NEEDLE) ×2 IMPLANT
ELECT REM PT RETURN 9FT ADLT (ELECTROSURGICAL)
ELECTRODE REM PT RTRN 9FT ADLT (ELECTROSURGICAL) IMPLANT
GAUZE SPONGE 4X4 12PLY STRL (GAUZE/BANDAGES/DRESSINGS) ×2 IMPLANT
GAUZE SPONGE 4X4 12PLY STRL LF (GAUZE/BANDAGES/DRESSINGS) ×2 IMPLANT
GLOVE BIOGEL PI ORTHO PRO 7.5 (GLOVE) ×1
GLOVE BIOGEL PI ORTHO PRO SZ8 (GLOVE) ×1
GLOVE ORTHO TXT STRL SZ7.5 (GLOVE) ×2 IMPLANT
GLOVE PI ORTHO PRO STRL 7.5 (GLOVE) ×1 IMPLANT
GLOVE PI ORTHO PRO STRL SZ8 (GLOVE) ×1 IMPLANT
GLOVE SURG ORTHO 8.5 STRL (GLOVE) ×2 IMPLANT
GOWN STRL REUS W/ TWL LRG LVL3 (GOWN DISPOSABLE) ×2 IMPLANT
GOWN STRL REUS W/ TWL XL LVL3 (GOWN DISPOSABLE) ×2 IMPLANT
GOWN STRL REUS W/TWL LRG LVL3 (GOWN DISPOSABLE) ×2
GOWN STRL REUS W/TWL XL LVL3 (GOWN DISPOSABLE) ×2
KIT BASIN OR (CUSTOM PROCEDURE TRAY) ×2 IMPLANT
KIT JUGGERKNOT DISP 2.9MM (KITS) IMPLANT
KIT TURNOVER KIT B (KITS) ×2 IMPLANT
MANIFOLD NEPTUNE II (INSTRUMENTS) ×2 IMPLANT
NDL SUT 6 .5 CRC .975X.05 MAYO (NEEDLE) IMPLANT
NEEDLE HYPO 25GX1X1/2 BEV (NEEDLE) ×2 IMPLANT
NEEDLE MAYO TAPER (NEEDLE)
NEEDLE SPNL 18GX3.5 QUINCKE PK (NEEDLE) ×2 IMPLANT
NS IRRIG 1000ML POUR BTL (IV SOLUTION) ×2 IMPLANT
PACK SHOULDER (CUSTOM PROCEDURE TRAY) ×2 IMPLANT
PAD ABD 8X10 STRL (GAUZE/BANDAGES/DRESSINGS) ×2 IMPLANT
PAD ARMBOARD 7.5X6 YLW CONV (MISCELLANEOUS) ×4 IMPLANT
PROBE BIPOLAR ATHRO 135MM 90D (MISCELLANEOUS) ×2 IMPLANT
RESECTOR FULL RADIUS 4.2MM (BLADE) ×2 IMPLANT
SET ARTHROSCOPY TUBING (MISCELLANEOUS) ×1
SET ARTHROSCOPY TUBING LN (MISCELLANEOUS) ×1 IMPLANT
SET JUGGERKNOT DISP 1.4MM (SET/KITS/TRAYS/PACK) IMPLANT
SLING ARM FOAM STRAP LRG (SOFTGOODS) ×2 IMPLANT
SLING ARM FOAM STRAP MED (SOFTGOODS) IMPLANT
SPONGE LAP 4X18 RFD (DISPOSABLE) ×2 IMPLANT
STRIP CLOSURE SKIN 1/2X4 (GAUZE/BANDAGES/DRESSINGS) ×2 IMPLANT
SUCTION FRAZIER HANDLE 10FR (MISCELLANEOUS) ×1
SUCTION TUBE FRAZIER 10FR DISP (MISCELLANEOUS) ×1 IMPLANT
SUT BONE WAX W31G (SUTURE) ×2 IMPLANT
SUT FIBERWIRE #2 38 T-5 BLUE (SUTURE)
SUT HI-FI 2 STRAND C-2 40 (SUTURE) ×2 IMPLANT
SUT MNCRL AB 4-0 PS2 18 (SUTURE) ×2 IMPLANT
SUT VIC AB 0 CT1 27 (SUTURE)
SUT VIC AB 0 CT1 27XBRD ANBCTR (SUTURE) IMPLANT
SUT VIC AB 0 CT2 27 (SUTURE) ×2 IMPLANT
SUT VIC AB 2-0 CT1 27 (SUTURE)
SUT VIC AB 2-0 CT1 TAPERPNT 27 (SUTURE) IMPLANT
SUTURE FIBERWR #2 38 T-5 BLUE (SUTURE) IMPLANT
SYR CONTROL 10ML LL (SYRINGE) ×2 IMPLANT
TOWEL OR 17X24 6PK STRL BLUE (TOWEL DISPOSABLE) ×2 IMPLANT
TOWEL OR 17X26 10 PK STRL BLUE (TOWEL DISPOSABLE) ×2 IMPLANT
TUBE CONNECTING 12X1/4 (SUCTIONS) ×2 IMPLANT
WAND HAND CNTRL MULTIVAC 90 (MISCELLANEOUS) IMPLANT
WATER STERILE IRR 1000ML POUR (IV SOLUTION) ×2 IMPLANT

## 2018-01-27 NOTE — Transfer of Care (Signed)
Immediate Anesthesia Transfer of Care Note  Patient: SYLVIA KONDRACKI  Procedure(s) Performed: LEFT SHOULDER ARTHROSCOPY, SUBACROMIAL DECOMPRESSION, OPEN ROTATOR CUFF REPAIR, OPEN DISTAL CLAVICLE RESECTION, OPEN BICEPS TENODESIS (Left Shoulder)  Patient Location: PACU  Anesthesia Type:MAC combined with regional for post-op pain  Level of Consciousness: drowsy and patient cooperative  Airway & Oxygen Therapy: Patient Spontanous Breathing and Patient connected to face mask oxygen  Post-op Assessment: Report given to RN and Post -op Vital signs reviewed and stable  Post vital signs: Reviewed and stable  Last Vitals:  Vitals Value Taken Time  BP 144/91 01/27/2018 12:22 PM  Temp    Pulse 58 01/27/2018 12:22 PM  Resp 15 01/27/2018 12:22 PM  SpO2 100 % 01/27/2018 12:22 PM  Vitals shown include unvalidated device data.  Last Pain:  Vitals:   01/27/18 0822  TempSrc:   PainSc: 3       Patients Stated Pain Goal: 3 (09/30/17 7588)  Complications: No apparent anesthesia complications

## 2018-01-27 NOTE — Op Note (Signed)
[name], [name] MEDICAL RECORD UE:[id number] ACCOUNT [id number] DATE OF BIRTH:07-Feb-1946 FACILITY: [facility] LOCATION: [facility] PHYSICIAN:STEVEN R. Jshawn Hurta, MD  OPERATIVE REPORT  DATE OF PROCEDURE:  01/27/2018  PREOPERATIVE DIAGNOSES:  Left shoulder rotator cuff tear, superior labrum anterior and posterior tear and acromioclavicular joint arthritis.  POSTOPERATIVE DIAGNOSES:   Left shoulder rotator cuff tear, superior labrum anterior and posterior tear and acromioclavicular joint arthritis.  PROCEDURE PERFORMED:  Left shoulder arthroscopy with extensive intraarticular debridement of torn superior labrum, anterior to posterior arthroscopic biceps tenotomy, arthroscopic chondroplasty, followed by arthroscopic subacromial decompression with  mini-open rotator cuff repair, biceps tenodesis in the groove and open distal clavicle resection.  SURGEON:  Malon Kindle, MD  ASSISTANT:  Modesto Charon, [place], who was scrubbed during the entire procedure and necessary for satisfactory completion of surgery.  ANESTHESIA:  General anesthesia was used plus interscalene block.  ESTIMATED BLOOD LOSS:  Minimal.  FLUID REPLACEMENT:  1500 mL crystalloid.  INSTRUMENT COUNTS:  Correct.  COMPLICATIONS:  There were no complications.  ANTIBIOTICS:  Perioperative antibiotics were given.  INDICATIONS:  The patient is a 72 year old male who presents with a history of worsening left shoulder pain and dysfunction.  The patient has had an MRI scan indicating torn rotator cuff as well as SLAP tear and advanced AC arthritis.  Clinical findings  are consistent with MRI.  We discussed options for management to include continued conservative management versus surgery.  The patient elected to proceed with surgical treatment to restore function and eliminate pain to the shoulder.  Informed consent  obtained.  DESCRIPTION OF PROCEDURE:  After an adequate level of anesthesia was achieved, the patient was positioned  in the modified beach chair position.  The left shoulder was correctly identified and sterilely prepped and draped in the usual manner.  A timeout  called.  After verification of correct patient and correct site, we entered the shoulder using standard arthroscopic portals, including anterior, posterior and lateral portals.  We identified significant tearing of the superior labrum and biceps  junction.  We performed a biceps tenotomy and labral debridement back to a stable labral rim.  There was also some chondromalacia, grade III, central portion of the glenoid and some on the humeral head.  Gentle tangential chondroplasty was performed.   There was some fraying of the labrum diffusely throughout the shoulder.  We did a labral debridement on all unstable labral tissue.  Subscapularis was intact.  The supraspinatus was torn back into the anterior portion of the infraspinatus.  Teres minor  was intact.  We completed the intraarticular portion of her surgery and then moved the scope into the subacromial space.  Subacromial decompression performed in acromioplasty using a high-speed bur.  A type 1 acromial shape was created.  Anterior and  lateral spurs were removed.  CA ligament was released.  The [facility] joint was completely arthritic with articular cartilage not present on the distal clavicle.  We concluded the arthroscopic subacromial decompression verifying that there was a full-thickness  rotator cuff tear, and then we concluded the arthroscopic portion of the procedure and moved to the open portion and made a small saber incision overlying the AC joint.  Dissection down through subcutaneous tissues using the Bovie.  We identified the  deltotrapezial fascia and incised in line with distal clavicle.  Subperiosteal dissection of distal clavicle performed followed by excision of distal 2-3 mm of bone using an oscillating saw.  We irrigated thoroughly, removed the bony debris.  We applied  bone wax  to the cut in  the clavicle.  We also removed dorsal hypertrophy osteophytes off the acromial side.  We then repaired deltotrapezial fascia anatomically.  We verified anterior and posterior AC ligaments were intact, which they were.  After repair  of deltotrapezial fascia, we repaired the subcutaneous tissue with 2-0 Vicryl and 4-0 Monocryl for skin.  We then used a small mini-open incision for the rotator cuff and the biceps tenodesis.  We started about a centimeter anterolateral to the acromial  tip and then proceeded down the raphae between the anterior and lateral heads of the deltoid.  Dissection down through subcutaneous tissues.  Deltoid raphae identified and split.  The Arthrex retractor placed.  We identified the biceps groove.   Delivered the biceps tendon out of the wound.  We whipstitched with #2 Hi-Fi suture to reinforce the tendon.  We then prepared the floor of the biceps groove with a needle tip Bovie and a Therapist, nutritional, placed a Y-Knot flex anchor through the floor of  the biceps groove and brought that up in a mattress fashion through the reinforced portion of the tendon, tying the tendon flush down to the floor of the bicipital groove.  We brought the longitudinal whipstitch sutures up through the rotator interval  and tied over a soft tissue bridge, incorporating part of the subscap.  Estimated double repair.  We oversewed with 0 Vicryl suture.  A nice low profile tenodesis.  We then addressed the rotator cuff tear, which was a U-shaped tear.  We mobilized the  free edge of the tendon and brought it posterolaterally.  We used #2 Hi-Fi suture for our frayed sutures and mobilized with a Cobb elevator on both sides of the rotator cuff.  We prepared the greater tuberosity with a rongeur and curette, placed 2 Y-Knot  RC anchors at the articular margin, double loaded with the #2 ribbon, and brought the sutures up in a mattress fashion, restoring the medial portion of the footprint, tied our lateral free-edge  sutures down over bone bridges, so we made bone tunnels and  tied over the greater tuberosity.  Thus, we had a double-row repair, watertight.  No dog ears.  Posteriorly, had a nice repair.  Everything moving together as a unit as I ranged her shoulder.  No impingement noted.  We irrigated thoroughly and then  repaired the deltoid anatomically with 0 Vicryl suture followed by 2-0 Vicryl for subcutaneous closure and 4-0 Monocryl for skin and portals.  Steri-Strips applied followed by a sterile dressing.  The patient tolerated surgery well.  LN/NUANCE  D:01/27/2018 T:01/27/2018 JOB:[id number]

## 2018-01-27 NOTE — Anesthesia Postprocedure Evaluation (Signed)
Anesthesia Post Note  Patient: [name]  Procedure(s) Performed: LEFT SHOULDER ARTHROSCOPY, SUBACROMIAL DECOMPRESSION, OPEN ROTATOR CUFF REPAIR, OPEN DISTAL CLAVICLE RESECTION, OPEN BICEPS TENODESIS (Left Shoulder)     Patient location during evaluation: PACU Anesthesia Type: General and Regional Level of consciousness: awake and alert, oriented and patient cooperative Pain management: pain level controlled Vital Signs Assessment: post-procedure vital signs reviewed and stable Respiratory status: spontaneous breathing, nonlabored ventilation, respiratory function stable and patient connected to nasal cannula oxygen Cardiovascular status: blood pressure returned to baseline and stable Postop Assessment: no apparent nausea or vomiting Anesthetic complications: no    Last Vitals:  Vitals:   01/27/18 1237 01/27/18 1246  BP: 139/85 (!) 148/88  Pulse: (!) 55 (!) 58  Resp: 13 15  Temp:  (!) 36.1 C  SpO2: 97% 97%    Last Pain:  Vitals:   01/27/18 1246  TempSrc:   PainSc: 0-No pain                 Heidemarie Goodnow,E. Grafton Warzecha

## 2018-01-27 NOTE — Anesthesia Procedure Notes (Signed)
Procedure Name: Intubation Performed by: Milford Cage, CRNA Pre-anesthesia Checklist: Patient identified, Emergency Drugs available, Suction available and Patient being monitored Patient Re-evaluated:Patient Re-evaluated prior to induction Oxygen Delivery Method: Circle System Utilized Preoxygenation: Pre-oxygenation with 100% oxygen Induction Type: IV induction Ventilation: Oral airway inserted - appropriate to patient size and Mask ventilation with difficulty Laryngoscope Size: Mac and 4 Grade View: Grade II Tube type: Oral Tube size: 7.5 mm Number of attempts: 1 Airway Equipment and Method: Stylet and Oral airway Placement Confirmation: ETT inserted through vocal cords under direct vision,  positive ETCO2 and breath sounds checked- equal and bilateral Secured at: 24 cm Tube secured with: Tape Dental Injury: Teeth and Oropharynx as per pre-operative assessment

## 2018-01-27 NOTE — Discharge Instructions (Signed)
Ice to the shoulder constantly.  Keep the incision covered and clean and dry for one week, then ok to get it wet in the shower.  Do exercise as instructed several times per day.  Gentle pendulum exercises, Gentle lap slides - resting hand on lap slide hand out to the knee and back to the hip back and forth Hug and Hitch Hike rotation exercises  DO NOT reach behind your back or push up out of a chair with the operative arm.  Use a sling while you are up and around for comfort, may remove while seated.  Keep pillow propped behind the operative elbow.  Follow up with Dr Ranell Patrick in two weeks in the office, call [phone] for appt

## 2018-01-27 NOTE — Anesthesia Procedure Notes (Signed)
Anesthesia Regional Block: Interscalene brachial plexus block   Pre-Anesthetic Checklist: ,, timeout performed, Correct Patient, Correct Site, Correct Laterality, Correct Procedure, Correct Position, site marked, Risks and benefits discussed,  Surgical consent,  Pre-op evaluation,  At surgeon's request and post-op pain management  Laterality: Left and Upper  Prep: chloraprep       Needles:  Injection technique: Single-shot  Needle Type: Echogenic Stimulator Needle     Needle Length: 9cm  Needle Gauge: 21     Additional Needles:   Procedures:, nerve stimulator,,, ultrasound used (permanent image in chart),,,,   Nerve Stimulator or Paresthesia:  Response: forearm twitch , 0.4 mA, 0.1 ms,   Additional Responses:   Narrative:  Start time: 01/27/2018 9:10 AM End time: 01/27/2018 9:22 AM Injection made incrementally with aspirations every 5 mL.  Performed by: Personally  Anesthesiologist: Jairo Ben, MD  Additional Notes: Pt identified in Holding room.  Monitors applied. Working IV access confirmed. Sterile prep, drape L clavicle and neck.  #21ga ECHOgenic PNS to forearm twitch at 0.39mA threshold with US guidance.  10cc 0.5% Bupivacaine with 10cc Exparel injected incrementally after negative test dose.  Patient asymptomatic, VSS, no heme aspirated, tolerated well.  Sandford Craze, MD

## 2018-01-27 NOTE — Progress Notes (Signed)
Orthopedic Tech Progress Note Patient Details:  [name] 09/14/1945 [id number]  Ortho Devices Type of Ortho Device: Abduction pillow Ortho Device/Splint Interventions: Application   Post Interventions Patient Tolerated: Well Instructions Provided: Care of device   Saul Fordyce 01/27/2018, 11:48 AM

## 2018-01-27 NOTE — Brief Op Note (Signed)
01/27/2018  12:25 PM  PATIENT:  [name]  72 y.o. male  PRE-OPERATIVE DIAGNOSIS:  left shoulder rotator cuff tear, AC joint DJD, SLAP tear  POST-OPERATIVE DIAGNOSIS:  left shoulder rotator cuff tear, AC DJD, SLAP tear  PROCEDURE:  Procedure(s): LEFT SHOULDER ARTHROSCOPY, SUBACROMIAL DECOMPRESSION, OPEN ROTATOR CUFF REPAIR, OPEN DISTAL CLAVICLE RESECTION, OPEN BICEPS TENODESIS (Left)  SURGEON:  Surgeon(s) and Role:    Beverely Low, MD - Primary  PHYSICIAN ASSISTANT:   ASSISTANTS: Thea Gist, PA-C   ANESTHESIA:   regional and general  EBL:  150 mL   BLOOD ADMINISTERED:none  DRAINS: none   LOCAL MEDICATIONS USED:  MARCAINE     SPECIMEN:  No Specimen  DISPOSITION OF SPECIMEN:  N/A  COUNTS:  YES  TOURNIQUET:  * No tourniquets in log *  DICTATION: .Other Dictation: Dictation Number [id number]  PLAN OF CARE: Discharge to home after PACU  PATIENT DISPOSITION:  PACU - hemodynamically stable.   Delay start of Pharmacological VTE agent (>24hrs) due to surgical blood loss or risk of bleeding: not applicable

## 2018-01-27 NOTE — Interval H&P Note (Signed)
History and Physical Interval Note:  01/27/2018 9:37 AM  [name]  has presented today for surgery, with the diagnosis of left shoulder rotator cuff tear  The various methods of treatment have been discussed with the patient and family. After consideration of risks, benefits and other options for treatment, the patient has consented to  Procedure(s): LEFT SHOULDER ARTHROSCOPY, SUBACROMIAL DECOMPRESSION, OPEN ROTATOR CUFF REPAIR, OPEN DISTAL CLAVICLE RESECTION, OPEN BICEPS TENODESIS (Left) as a surgical intervention .  The patient's history has been reviewed, patient examined, no change in status, stable for surgery.  I have reviewed the patient's chart and labs.  Questions were answered to the patient's satisfaction.     Jerelle Virden,STEVEN R

## 2018-01-30 ENCOUNTER — Encounter: Payer: Self-pay | Admitting: Cardiovascular Disease

## 2018-01-30 DIAGNOSIS — M25512 Pain in left shoulder: Secondary | ICD-10-CM | POA: Diagnosis not present

## 2018-02-02 ENCOUNTER — Ambulatory Visit (INDEPENDENT_AMBULATORY_CARE_PROVIDER_SITE_OTHER): Payer: 59

## 2018-02-02 DIAGNOSIS — Z5181 Encounter for therapeutic drug level monitoring: Secondary | ICD-10-CM

## 2018-02-02 DIAGNOSIS — M25512 Pain in left shoulder: Secondary | ICD-10-CM | POA: Diagnosis not present

## 2018-02-02 DIAGNOSIS — I2699 Other pulmonary embolism without acute cor pulmonale: Secondary | ICD-10-CM | POA: Diagnosis not present

## 2018-02-02 DIAGNOSIS — Z7901 Long term (current) use of anticoagulants: Secondary | ICD-10-CM | POA: Diagnosis not present

## 2018-02-02 DIAGNOSIS — Z9889 Other specified postprocedural states: Secondary | ICD-10-CM | POA: Diagnosis not present

## 2018-02-02 LAB — POCT INR: INR: 1.6 — AB (ref 2.0–3.0)

## 2018-02-02 NOTE — Patient Instructions (Signed)
Description   Take 2 tablets today, then resume same dosage 1 tablet daily except 2 tablets on Mondays and Fridays. Continue Lovenox once daily, take your last dosage of Lovenox on Saturday 02/04/18.  Recheck on Tuesday.  Please call with any medication changes or if scheduled for any procedures Coumadin Clinic# [phone]. Main # [phone].

## 2018-02-06 ENCOUNTER — Ambulatory Visit (INDEPENDENT_AMBULATORY_CARE_PROVIDER_SITE_OTHER): Payer: 59 | Admitting: Pharmacist

## 2018-02-06 DIAGNOSIS — Z9889 Other specified postprocedural states: Secondary | ICD-10-CM | POA: Diagnosis not present

## 2018-02-06 DIAGNOSIS — Z7901 Long term (current) use of anticoagulants: Secondary | ICD-10-CM | POA: Diagnosis not present

## 2018-02-06 DIAGNOSIS — I2699 Other pulmonary embolism without acute cor pulmonale: Secondary | ICD-10-CM | POA: Diagnosis not present

## 2018-02-06 DIAGNOSIS — Z5181 Encounter for therapeutic drug level monitoring: Secondary | ICD-10-CM

## 2018-02-06 LAB — POCT INR: INR: 2 (ref 2.0–3.0)

## 2018-02-06 NOTE — Patient Instructions (Signed)
Description   Continue 1 tablet daily except 2 tablets on Mondays and Fridays. Recheck in 2 week.  Please call with any medication changes or if scheduled for any procedures Coumadin Clinic# [phone]. Main # [phone].

## 2018-02-07 DIAGNOSIS — M25512 Pain in left shoulder: Secondary | ICD-10-CM | POA: Diagnosis not present

## 2018-02-09 DIAGNOSIS — M25512 Pain in left shoulder: Secondary | ICD-10-CM | POA: Diagnosis not present

## 2018-02-13 DIAGNOSIS — M25512 Pain in left shoulder: Secondary | ICD-10-CM | POA: Diagnosis not present

## 2018-02-20 ENCOUNTER — Ambulatory Visit (INDEPENDENT_AMBULATORY_CARE_PROVIDER_SITE_OTHER): Payer: 59 | Admitting: *Deleted

## 2018-02-20 ENCOUNTER — Ambulatory Visit (INDEPENDENT_AMBULATORY_CARE_PROVIDER_SITE_OTHER): Payer: 59 | Admitting: Cardiovascular Disease

## 2018-02-20 ENCOUNTER — Encounter: Payer: Self-pay | Admitting: Cardiovascular Disease

## 2018-02-20 VITALS — BP 124/76 | HR 88 | Ht 71.0 in | Wt 213.0 lb

## 2018-02-20 DIAGNOSIS — Z9889 Other specified postprocedural states: Secondary | ICD-10-CM

## 2018-02-20 DIAGNOSIS — I428 Other cardiomyopathies: Secondary | ICD-10-CM

## 2018-02-20 DIAGNOSIS — I2699 Other pulmonary embolism without acute cor pulmonale: Secondary | ICD-10-CM

## 2018-02-20 DIAGNOSIS — Z7901 Long term (current) use of anticoagulants: Secondary | ICD-10-CM | POA: Diagnosis not present

## 2018-02-20 DIAGNOSIS — I2510 Atherosclerotic heart disease of native coronary artery without angina pectoris: Secondary | ICD-10-CM | POA: Diagnosis not present

## 2018-02-20 DIAGNOSIS — Z5181 Encounter for therapeutic drug level monitoring: Secondary | ICD-10-CM

## 2018-02-20 LAB — POCT INR: INR: 2.4 (ref 2.0–3.0)

## 2018-02-20 MED FILL — WARFARIN SODIUM 5 MG TABLET: 5 | 90 days supply | Qty: 150 | Fill #1

## 2018-02-20 NOTE — Patient Instructions (Signed)
Medication Instructions:  Your physician recommends that you continue on your current medications as directed. Please refer to the Current Medication list given to you today.  If you need a refill on your cardiac medications before your next appointment, please call your pharmacy.   Lab work: none If you have labs (blood work) drawn today and your tests are completely normal, you will receive your results only by: Marland Kitchen MyChart Message (if you have MyChart) OR . A paper copy in the mail If you have any lab test that is abnormal or we need to change your treatment, we will call you to review the results.  Testing/Procedures: none  Follow-Up: At [facility], you and your health needs are our priority.  As part of our continuing mission to provide you with exceptional heart care, we have created designated Provider Care Teams.  These Care Teams include your primary Cardiologist (physician) and Advanced Practice Providers (APPs -  Physician Assistants and Nurse Practitioners) who all work together to provide you with the care you need, when you need it. You will need a follow up appointment in 12 months.  Please call our office 2 months in advance to schedule this appointment.  You may see Verne Carrow, MD or one of the following Advanced Practice Providers on your designated Care Team:   [place], PA-C Ronie Spies, PA-C . Jacolyn Reedy, PA-C  Any Other Special Instructions Will Be Listed Below (If Applicable). Call us if you would like to schedule appointment in the lipid clinic

## 2018-02-20 NOTE — Progress Notes (Signed)
Chief Complaint  Patient presents with  . Follow-up    CAD   History of Present Illness:  72 yo male with history of HTN, recurrent pulmonary emboli on anticoagulation, mild nonobstructive CAD, non-ischemic cardiomyopathy and MV repair who is here today for cardiac follow up. Cardiac cath April 2014 with mild CAD. He had a hip replacement 2009 and presented with SOB March 2010 and was found to have bilateral pulmonary emboli with recurrence in 2012 off of anti-coagulation therapy. Cardiac monitor September 2014 in setting of dizziness showed sinus rhythm with PVCs. Echo June 2019 with LVEF=35-40%, dilated LV, mild mitral regurgitation with stable mitral valve repair. Coreg started June 2019 but he did not tolerate due to fatigue.   He is here today for follow up. The patient denies any chest pain, dyspnea, palpitations, lower extremity edema, orthopnea, PND, dizziness, near syncope or syncope.   Primary Care Physician: Unk Pinto, MD  Past Medical History:  Diagnosis Date  . Allergy    SEASONAL  . Cataract    SMALL IN LEFT EYE  . CKD (chronic kidney disease), stage III ([place])   . Coronary artery disease    a. Cath 2014 - mild nonobstructive CAD - 40% prox LAD, 30% OM2, otherwise mild irregularities.  . Degenerative joint disease   . Headache   . Hyperlipidemia   . Hypertension   . Inguinal hernia    right  . Osteoarthritis   . Pre-diabetes   . Pulmonary embolism [facility])    march 2010, 2012              /   DVT x  2  LEFT 2010  . S/P mitral valve repair 08/15/2012   Complex valvuloplasty including triangular resection of posterior leaflet, artificial Gore-tex neocord placement x4 and Sorin Memo 3D ring annuloplasty via right mini thoracotomy approach  . Seasonal allergies   . Severe mitral regurgitation by prior echocardiogram    a. s/p MV repair 2014.  . Vitamin D deficiency   . Wears glasses     Past Surgical History:  Procedure Laterality Date  . achillis tendon       repair 20 years ago  . CARDIAC CATHETERIZATION    . COLONOSCOPY    . COLONOSCOPY W/ BIOPSIES AND POLYPECTOMY    . HIP ARTHROPLASTY     11/06/07  . INGUINAL HERNIA REPAIR Right 11/07/2015   Procedure: LAPAROSCOPIC RIGHT INGUINAL HERNIA WITH MESH;  Surgeon: Ralene Ok, MD;  Location: [place];  Service: General;  Laterality: Right;  . INSERTION OF MESH Right 11/07/2015   Procedure: INSERTION OF MESH;  Surgeon: Ralene Ok, MD;  Location: [place];  Service: General;  Laterality: Right;  . INTRAOPERATIVE TRANSESOPHAGEAL ECHOCARDIOGRAM N/A 08/15/2012   Procedure: INTRAOPERATIVE TRANSESOPHAGEAL ECHOCARDIOGRAM;  Surgeon: Rexene Alberts, MD;  Location: [place];  Service: Open Heart Surgery;  Laterality: N/A;  . MITRAL VALVE REPAIR Right 08/15/2012   Procedure: MINIMALLY INVASIVE MITRAL VALVE REPAIR (MVR);  Surgeon: Rexene Alberts, MD;  Location: [place];  Service: Open Heart Surgery;  Laterality: Right;  . SHOULDER ARTHROSCOPY WITH ROTATOR CUFF REPAIR AND SUBACROMIAL DECOMPRESSION Left 01/27/2018   Procedure: LEFT SHOULDER ARTHROSCOPY, SUBACROMIAL DECOMPRESSION, OPEN ROTATOR CUFF REPAIR, OPEN DISTAL CLAVICLE RESECTION, OPEN BICEPS TENODESIS;  Surgeon: Netta Cedars, MD;  Location: [place];  Service: Orthopedics;  Laterality: Left;  . TEE WITHOUT CARDIOVERSION N/A 07/04/2012   Procedure: TRANSESOPHAGEAL ECHOCARDIOGRAM (TEE);  Surgeon: Larey Dresser, MD;  Location: [place];  Service:  Cardiovascular;  Laterality: N/A;  . TOTAL HIP ARTHROPLASTY  08/24/2011   Procedure: TOTAL HIP ARTHROPLASTY ANTERIOR APPROACH;  Surgeon: Mauri Pole, MD;  Location: [facility];  Service: Orthopedics;  Laterality: Right;    Current Outpatient Medications  Medication Sig Dispense Refill  . acetaminophen (TYLENOL) 500 MG tablet Take 1,000 mg by mouth every 6 (six) hours as needed for moderate pain or headache.     Marland Kitchen amoxicillin (AMOXIL) 500 MG capsule Take 500 mg by mouth 3 (three) times daily. 1 hour prior to dental appt.     . Artificial Tear Solution (SOOTHE XP OP) Place 1 drop into both eyes daily as needed (dry eyes).    . benazepril (LOTENSIN) 20 MG tablet Take 20 mg by mouth at bedtime.    . bisacodyl (DULCOLAX) 5 MG EC tablet Take 10 mg by mouth daily as needed for moderate constipation.    . blood glucose meter kit and supplies KIT Dispense based on insurance preference.check blood sugar 1 time daily. DX-E11.22 1 each 0  . Cholecalciferol (VITAMIN D3) 5000 units CAPS Take 5,000 Units by mouth at bedtime.    Marland Kitchen CINNAMON PO Take 2,000 mg by mouth daily. Takes 2 tablets (2030m ) daily     . diclofenac sodium (VOLTAREN) 1 % GEL Apply 1 application topically 4 (four) times daily as needed for pain.  4  . ezetimibe (ZETIA) 10 MG tablet Take 10 mg by mouth daily.    . fenofibrate micronized (LOFIBRA) 134 MG capsule Take 134 mg by mouth at bedtime.    .Marland Kitchenloratadine (CLARITIN) 10 MG tablet Take 10 mg by mouth daily as needed for allergies.    . methocarbamol (ROBAXIN) 500 MG tablet Take 1 tablet (500 mg total) by mouth 3 (three) times daily as needed. 60 tablet 1  . mometasone (NASONEX) 50 MCG/ACT nasal spray Place 2 sprays into the nose daily as needed (allergies).     .Marland KitchenOVER THE COUNTER MEDICATION Apply 1 application topically daily as needed (joint pain). Glucosamine Chondroitin MSM topical cream    . tamsulosin (FLOMAX) 0.4 MG CAPS capsule Take 1 capsule at bedtime for prostate (Patient taking differently: Take 0.4 mg by mouth at bedtime. ) 30 capsule 1  . warfarin (COUMADIN) 5 MG tablet TAKE AS DIRECTED BY COUMADIN CLINIC. (Patient taking differently: Take 5-10 mg by mouth See admin instructions. Take 10 mg at night on Mon and Fri, take 5 mg at night on Tues, Wed, Thurs, Sat and Sun,) 150 tablet 1   No current facility-administered medications for this visit.     Allergies  Allergen Reactions  . Statins Other (See Comments)    muscle aches  . Welchol [Colesevelam Hcl] Other (See Comments)    Muscle weakness     Social History   Socioeconomic History  . Marital status: Married    Spouse name: Not on file  . Number of children: 3  . Years of education: Not on file  . Highest education level: Not on file  Occupational History  . Occupation: IProduct manager [employer] . Financial resource strain: Not on file  . Food insecurity:    Worry: Not on file    Inability: Not on file  . Transportation needs:    Medical: Not on file    Non-medical: Not on file  Tobacco Use  . Smoking status: Former Smoker    Types: Cigars    Last attempt to quit:  03/22/1973    Years since quitting: 44.9  . Smokeless tobacco: Current User    Types: Chew  . Tobacco comment: uses smokeless tobacco  Substance and Sexual Activity  . Alcohol use: No    Alcohol/week: 0.0 standard drinks  . Drug use: No  . Sexual activity: Not Currently  Lifestyle  . Physical activity:    Days per week: Not on file    Minutes per session: Not on file  . Stress: Not on file  Relationships  . Social connections:    Talks on phone: Not on file    Gets together: Not on file    Attends religious service: Not on file    Active member of club or organization: Not on file    Attends meetings of clubs or organizations: Not on file    Relationship status: Not on file  . Intimate partner violence:    Fear of current or ex partner: Not on file    Emotionally abused: Not on file    Physically abused: Not on file    Forced sexual activity: Not on file  Other Topics Concern  . Not on file  Social History Narrative  . Not on file    Family History  Problem Relation Age of Onset  . Heart attack Father   . Hypertension Father   . Coronary artery disease Unknown   . Heart attack Brother 26  . Hypertension Sister   . Hypertension Brother   . Hyperlipidemia Sister   . Diabetes Sister   . Diabetes Brother     Review of Systems:  As stated in the HPI and otherwise negative.   BP  124/76   Pulse 88   Ht 5' 11"  (1.803 m)   Wt 213 lb (96.6 kg)   SpO2 93%   BMI 29.71 kg/m   Physical Examination:  General: Well developed, well nourished, NAD  HEENT: OP clear, mucus membranes moist  SKIN: warm, dry. No rashes. Neuro: No focal deficits  Musculoskeletal: Muscle strength 5/5 all ext  Psychiatric: Mood and affect normal  Neck: No JVD, no carotid bruits, no thyromegaly, no lymphadenopathy.  Lungs:Clear bilaterally, no wheezes, rhonci, crackles Cardiovascular: Regular rate and rhythm with ectopy. No murmurs, gallops or rubs. Abdomen:Soft. Bowel sounds present. Non-tender.  Extremities: No lower extremity edema. Pulses are 2 + in the bilateral DP/PT.  Echo June 2019: Left ventricle: The cavity size was moderately dilated. Wall   thickness was normal. Systolic function was moderately reduced.   The estimated ejection fraction was in the range of 35% to 40%.   Moderate diffuse hypokinesis with no identifiable regional   variations. Doppler parameters are consistent with abnormal left   ventricular relaxation (grade 1 diastolic dysfunction). - Aortic valve: There was trivial regurgitation. - Mitral valve: Prior procedures included surgical repair. An   annular ring prosthesis was present. There was mild   regurgitation. - Left atrium: The atrium was mildly dilated. - Atrial septum: No defect or patent foramen ovale was identified.  EKG:  EKG is not ordered today. The ekg ordered today demonstrates   Recent Labs: 01/05/2018: ALT 20; Magnesium 1.8; TSH 1.22 01/19/2018: BUN 21; Creatinine, Ser 1.52; Hemoglobin 15.0; Platelets 240; Potassium 3.7; Sodium 139   Lipid Panel    Component Value Date/Time   CHOL 180 01/05/2018 0957   TRIG 68 01/05/2018 0957   HDL 55 01/05/2018 0957   CHOLHDL 3.3 01/05/2018 0957   VLDL 14 10/19/2016 1030   LDLCALC 110 (H)  01/05/2018 0957     Wt Readings from Last 3 Encounters:  02/20/18 213 lb (96.6 kg)  01/27/18 218 lb 11.1 oz  (99.2 kg)  01/19/18 218 lb 12.8 oz (99.2 kg)     Other studies Reviewed: Additional studies/ records that were reviewed today include:. Review of the above records demonstrates:    Assessment and Plan:   1. MITRAL REGURGITATION: he is s/p mitral valve repair. Mild MR by echo June 2019.    2. Recurrent DVT/PE: Continue coumadin.  .  3. CAD without angina: NO chest pain. He is not on an ASA since he is on coumadin. Continue beta blocker. He is intolerant of statins. Lipids followed in primary care. LDL not at goal of 70. We discussed referral to lipid clinic for [place] or [place] but he will call back if he decides to do this.   4. Cardiomyopathy, non-ischemic: Continue Ace-inh. He did not tolerate Coreg in June 2019 due to fatigue.   Current medicines are reviewed at length with the patient today.  The patient does not have concerns regarding medicines.  The following changes have been made:  no change  Labs/ tests ordered today include:  No orders of the defined types were placed in this encounter.   Disposition:   FU with me in 12  months  Signed, Lauree Chandler, MD 02/20/2018 8:22 AM    [facility] [address], [place], [place]  [address] Phone: [phone]; Fax: [phone]

## 2018-02-20 NOTE — Patient Instructions (Signed)
Description   Continue 1 tablet daily except 2 tablets on Mondays and Fridays. Recheck in 4 weeks.  Please call with any medication changes or if scheduled for any procedures Coumadin Clinic# [phone]. Main # [phone].

## 2018-02-21 DIAGNOSIS — M25512 Pain in left shoulder: Secondary | ICD-10-CM | POA: Diagnosis not present

## 2018-02-23 DIAGNOSIS — M25512 Pain in left shoulder: Secondary | ICD-10-CM | POA: Diagnosis not present

## 2018-02-28 DIAGNOSIS — M25512 Pain in left shoulder: Secondary | ICD-10-CM | POA: Diagnosis not present

## 2018-03-13 MED FILL — BENAZEPRIL HCL 20 MG TABLET: 20 | 90 days supply | Qty: 90 | Fill #1

## 2018-03-13 MED FILL — FENOFIBRATE 134 MG CAPSULE: 134 | 90 days supply | Qty: 90 | Fill #1

## 2018-03-17 DIAGNOSIS — M25512 Pain in left shoulder: Secondary | ICD-10-CM | POA: Diagnosis not present

## 2018-03-20 ENCOUNTER — Ambulatory Visit (INDEPENDENT_AMBULATORY_CARE_PROVIDER_SITE_OTHER): Payer: 59

## 2018-03-20 DIAGNOSIS — Z9889 Other specified postprocedural states: Secondary | ICD-10-CM | POA: Diagnosis not present

## 2018-03-20 DIAGNOSIS — Z7901 Long term (current) use of anticoagulants: Secondary | ICD-10-CM

## 2018-03-20 DIAGNOSIS — I2699 Other pulmonary embolism without acute cor pulmonale: Secondary | ICD-10-CM

## 2018-03-20 LAB — POCT INR: INR: 2.1 (ref 2.0–3.0)

## 2018-03-20 NOTE — Patient Instructions (Signed)
Description   Continue 1 tablet daily except 2 tablets on Mondays and Fridays. Recheck in 6 weeks.  Please call with any medication changes or if scheduled for any procedures Coumadin Clinic# [phone]. Main # [phone].     

## 2018-03-21 DIAGNOSIS — M25512 Pain in left shoulder: Secondary | ICD-10-CM | POA: Diagnosis not present

## 2018-03-24 DIAGNOSIS — M25512 Pain in left shoulder: Secondary | ICD-10-CM | POA: Diagnosis not present

## 2018-03-28 DIAGNOSIS — M25512 Pain in left shoulder: Secondary | ICD-10-CM | POA: Diagnosis not present

## 2018-03-30 DIAGNOSIS — M25512 Pain in left shoulder: Secondary | ICD-10-CM | POA: Diagnosis not present

## 2018-04-06 DIAGNOSIS — M25512 Pain in left shoulder: Secondary | ICD-10-CM | POA: Diagnosis not present

## 2018-04-10 DIAGNOSIS — E1122 Type 2 diabetes mellitus with diabetic chronic kidney disease: Secondary | ICD-10-CM | POA: Insufficient documentation

## 2018-04-10 DIAGNOSIS — N183 Chronic kidney disease, stage 3 (moderate): Secondary | ICD-10-CM

## 2018-04-10 NOTE — Progress Notes (Deleted)
FOLLOW UP  Assessment and Plan:   Hypertension Well controlled with current medications  Monitor blood pressure at home; patient to call if consistently greater than 130/80 Continue DASH diet.   Reminder to go to the ER if any CP, SOB, nausea, dizziness, severe HA, changes vision/speech, left arm numbness and tingling and jaw pain.  Cholesterol Currently above goal; statin intolerant; continue zetia/fenofibrate, diet emphasized Continue low cholesterol diet and exercise.  Check lipid panel.   Diabetes with diabetic chronic kidney disease Continue medication: lifestyle  Continue diet and exercise.  Perform daily foot/skin check, notify office of any concerning changes.  Check A1C  Overweight Long discussion about weight loss, diet, and exercise Recommended diet heavy in fruits and veggies and low in animal meats, cheeses, and dairy products, appropriate calorie intake Discussed ideal weight for height  Will follow up in 3 months  Vitamin D Def Below goal at last visit; he *** change dose continue supplementation to maintain goal of 70-100 *** Vit D level  Continue diet and meds as discussed. Further disposition pending results of labs. Discussed med's effects and SE's.   Over 30 minutes of exam, counseling, chart review, and critical decision making was performed.   Future Appointments  Date Time Provider [place]  04/11/2018  8:45 AM Liane Comber, NP [facility] None  05/01/2018  8:30 AM [facility]  07/14/2018  9:30 AM Unk Pinto, MD [facility] None  01/10/2019 10:00 AM Unk Pinto, MD [facility] None    ----------------------------------------------------------------------------------------------------------------------  HPI 73 y.o. male  presents for 3 month follow up on hypertension, cholesterol, diabetes, obesity and vitamin D deficiency. Patient has hx/o PE in 2010 and 2012 and has been on Coumadin since  2012 followed at [facility].  BMI is There is no height or weight on file to calculate BMI., he has been working on diet and exercise. Going to PT for stamina twice a week.  Wt Readings from Last 3 Encounters:  02/20/18 213 lb (96.6 kg)  01/27/18 218 lb 11.1 oz (99.2 kg)  01/19/18 218 lb 12.8 oz (99.2 kg)   His blood pressure has been controlled at home, today their BP is    He does workout. He denies chest pain, shortness of breath, dizziness.   He is on cholesterol medication (zetia, fenofibrate due to statin intolerance) and denies myalgias. His cholesterol is not at goal. The cholesterol last visit was:   Lab Results  Component Value Date   CHOL 180 01/05/2018   HDL 55 01/05/2018   LDLCALC 110 (H) 01/05/2018   TRIG 68 01/05/2018   CHOLHDL 3.3 01/05/2018    He has been working on diet and exercise for T2 diabetes (currently borderline and treated by lifestyle only), and denies foot ulcerations, increased appetite, nausea, paresthesia of the feet, polydipsia, polyuria, vomiting and weight loss. He does report blurry vision and will be seeing ophthalmology. Last A1C in the office was:  Lab Results  Component Value Date   HGBA1C 6.4 (H) 01/05/2018   Patient is on Vitamin D supplement.   Lab Results  Component Value Date   VD25OH 48 01/05/2018        Current Medications:  Current Outpatient Medications on File Prior to Visit  Medication Sig  . acetaminophen (TYLENOL) 500 MG tablet Take 1,000 mg by mouth every 6 (six) hours as needed for moderate pain or headache.   Marland Kitchen amoxicillin (AMOXIL) 500 MG capsule Take 500 mg by mouth 3 (three) times daily. 1  hour prior to dental appt.  . Artificial Tear Solution (SOOTHE XP OP) Place 1 drop into both eyes daily as needed (dry eyes).  . benazepril (LOTENSIN) 20 MG tablet Take 20 mg by mouth at bedtime.  . bisacodyl (DULCOLAX) 5 MG EC tablet Take 10 mg by mouth daily as needed for moderate constipation.  . blood glucose meter  kit and supplies KIT Dispense based on insurance preference.check blood sugar 1 time daily. DX-E11.22  . Cholecalciferol (VITAMIN D3) 5000 units CAPS Take 5,000 Units by mouth at bedtime.  Marland Kitchen CINNAMON PO Take 2,000 mg by mouth daily. Takes 2 tablets (2073m ) daily   . diclofenac sodium (VOLTAREN) 1 % GEL Apply 1 application topically 4 (four) times daily as needed for pain.  .Marland Kitchenezetimibe (ZETIA) 10 MG tablet Take 10 mg by mouth daily.  . fenofibrate micronized (LOFIBRA) 134 MG capsule Take 134 mg by mouth at bedtime.  .Marland Kitchenloratadine (CLARITIN) 10 MG tablet Take 10 mg by mouth daily as needed for allergies.  . methocarbamol (ROBAXIN) 500 MG tablet Take 1 tablet (500 mg total) by mouth 3 (three) times daily as needed.  . mometasone (NASONEX) 50 MCG/ACT nasal spray Place 2 sprays into the nose daily as needed (allergies).   .Marland KitchenOVER THE COUNTER MEDICATION Apply 1 application topically daily as needed (joint pain). Glucosamine Chondroitin MSM topical cream  . tamsulosin (FLOMAX) 0.4 MG CAPS capsule Take 1 capsule at bedtime for prostate (Patient taking differently: Take 0.4 mg by mouth at bedtime. )  . warfarin (COUMADIN) 5 MG tablet TAKE AS DIRECTED BY COUMADIN CLINIC. (Patient taking differently: Take 5-10 mg by mouth See admin instructions. Take 10 mg at night on Mon and Fri, take 5 mg at night on Tues, Wed, Thurs, Sat and Sun,)   No current facility-administered medications on file prior to visit.      Allergies:  Allergies  Allergen Reactions  . Statins Other (See Comments)    muscle aches  . Welchol [Colesevelam Hcl] Other (See Comments)    Muscle weakness     Medical History:  Past Medical History:  Diagnosis Date  . Allergy    SEASONAL  . Cataract    SMALL IN LEFT EYE  . CKD ([place] disease[place]   . Coronary artery disease    a. Cath 2014 - mild nonobstructive CAD - 40% prox LAD, 30% OM2, otherwise mild irregularities.  . Degenerative joint disease   .  Headache   . Hyperlipidemia   . Hypertension   . Inguinal hernia    right  . Osteoarthritis   . Pre-diabetes   . Pulmonary embolism ([facility]    march 2010, 2012              /   DVT x  2  LEFT 2010  . S/P mitral valve repair 08/15/2012   Complex valvuloplasty including triangular resection of posterior leaflet, artificial Gore-tex neocord placement x4 and Sorin Memo 3D ring annuloplasty via right mini thoracotomy approach  . Seasonal allergies   . Severe mitral regurgitation by prior echocardiogram    a. s/p MV repair 2014.  . Vitamin D deficiency   . Wears glasses    Family history- Reviewed and unchanged Social history- Reviewed and unchanged   Review of Systems:  Review of Systems  Constitutional: Negative for malaise/fatigue and weight loss.  HENT: Negative for hearing loss and tinnitus.   Eyes: Negative for blurred vision and double vision.  Respiratory: Negative  for cough, shortness of breath and wheezing.   Cardiovascular: Negative for chest pain, palpitations, orthopnea, claudication and leg swelling.  Gastrointestinal: Negative for abdominal pain, blood in stool, constipation, diarrhea, heartburn, melena, nausea and vomiting.  Genitourinary: Negative.   Musculoskeletal: Negative for joint pain and myalgias.  Skin: Negative for rash.  Neurological: Negative for dizziness, tingling, sensory change, weakness and headaches.  Endo/Heme/Allergies: Negative for polydipsia.  Psychiatric/Behavioral: Negative.   All other systems reviewed and are negative.     Physical Exam: There were no vitals taken for this visit. Wt Readings from Last 3 Encounters:  02/20/18 213 lb (96.6 kg)  01/27/18 218 lb 11.1 oz (99.2 kg)  01/19/18 218 lb 12.8 oz (99.2 kg)   General Appearance: Well nourished, in no apparent distress. Eyes: PERRLA, EOMs, conjunctiva no swelling or erythema Sinuses: No Frontal/maxillary tenderness ENT/Mouth: Ext aud canals clear, TMs without erythema, bulging. No  erythema, swelling, or exudate on post pharynx.  Tonsils not swollen or erythematous. Hearing normal.  Neck: Supple, thyroid normal.  Respiratory: Respiratory effort normal, BS equal bilaterally without rales, rhonchi, wheezing or stridor.  Cardio: Irregularly irregular. Brisk peripheral pulses without edema.  Abdomen: Soft, + BS.  Non tender, no guarding, rebound, hernias, masses. Lymphatics: Non tender without lymphadenopathy.  Musculoskeletal: Full ROM, 5/5 strength, Slow antalgic gait Skin: Warm, dry without rashes, lesions, ecchymosis.  Neuro: Cranial nerves intact. No cerebellar symptoms.  Psych: Awake and oriented X 3, normal affect, Insight and Judgment appropriate.    Izora Ribas, NP 8:12 AM [facility]

## 2018-04-11 ENCOUNTER — Ambulatory Visit: Payer: Self-pay | Admitting: Adult Health

## 2018-04-11 DIAGNOSIS — M25512 Pain in left shoulder: Secondary | ICD-10-CM | POA: Diagnosis not present

## 2018-04-11 DIAGNOSIS — E11319 Type 2 diabetes mellitus with unspecified diabetic retinopathy without macular edema: Secondary | ICD-10-CM | POA: Insufficient documentation

## 2018-04-11 NOTE — Progress Notes (Signed)
MEDICARE ANNUAL WELLNESS VISIT AND FOLLOW UP Assessment:   Encounter for Medicare annual wellness exam  Essential hypertension - continue medications, DASH diet, exercise and monitor at home. Call if greater than 130/80.  -     CBC with Differential/Platelet -     CMP/GFR -     TSH  Type 2 diabetes mellitus with stage 3 chronic kidney disease, without long-term current use of insulin ([place]) Discussed general issues about diabetes pathophysiology and management., Educational material distributed., Suggested low cholesterol diet., Encouraged aerobic exercise., Discussed foot care., Reminded to get yearly retinal exam. -     Hemoglobin A1c  Stage 3 CKD associated with T2DM Increase fluids, avoid NSAIDS, monitor sugars, will monitor  Retinopathy due to T2DM Control glucose, htn, follow up as recommended with ophthalmology  Chronic seasonal allergic rhinitis, unspecified trigger - Allegra OTC, increase H20, allergy hygiene explained.  Osteoarthritis, unspecified osteoarthritis type, unspecified site  Mixed hyperlipidemia -continue medications, check lipids, decrease fatty foods, increase activity.  -     Lipid panel  Vitamin D deficiency -     VITAMIN D 25 Hydroxy (Vit-D Deficiency, Fractures)  Medication management -     Magnesium  Overweight Long discussion about weight loss, diet, and exercise Recommended diet heavy in fruits and veggies and low in animal meats, cheeses, and dairy products, appropriate calorie intake Patient will work on continue with portion control, add 30 min x 3 days a week of exercise Discussed appropriate weight for height, set goal weight of 207lb Follow up at next visit  Hx of colonic polyps Up to date  S/P mitral valve repair Cont follow up cardio  Right carotid bruit Normal carotid dopplers  Long term current use of anticoagulant therapy Continue coumadin clinic  Over 30 minutes of exam, counseling, chart review, and critical decision  making was performed  Future Appointments  Date Time Provider [place]  05/01/2018  8:30 AM [facility]  07/14/2018  9:30 AM Unk Pinto, MD [facility] None  01/10/2019 10:00 AM Unk Pinto, MD [facility] None     Plan:   During the course of the visit the patient was educated and counseled about appropriate screening and preventive services including:    Pneumococcal vaccine   Influenza vaccine  Prevnar 13  Td vaccine  Screening electrocardiogram  Colorectal cancer screening  Diabetes screening  Glaucoma screening  Nutrition counseling    Subjective:  [name] is a 73 y.o. AA male who presents for Medicare Annual Wellness Visit and 3 month follow up for HTN, hyperlipidemia, diabetes, and vitamin D Def.   He had L shoulder scope and rotator cuff repair on 01/27/2018 by Dr. Veverly Fells, is doing well, still working through PT with significantly improved ROM.   BMI is Body mass index is 29.57 kg/m., he has not been working on diet and exercise, portions smaller than previous. Wt Readings from Last 3 Encounters:  04/12/18 212 lb (96.2 kg)  02/20/18 213 lb (96.6 kg)  01/27/18 218 lb 11.1 oz (99.2 kg)   His blood pressure has been controlled at home, today their BP is BP: 120/78 He does not workout. He denies chest pain, shortness of breath, dizziness. He states that he has some SOB with exertion and occ diaphoresis, no cough, wheezing, CP, nausea, dizziness.  He has history of CAD with cath in 2009 showing mild CAD and severe MR, had MVR in 2014, follows with Dr. Julianne Handler. He has history of PE provoked by ortho surgery  in 2010 and again in 2012, now on coumadin since that time, managed via coumadin clinic:  Lab Results  Component Value Date   INR 2.1 03/20/2018   INR 2.4 02/20/2018   INR 2.0 02/06/2018   PROTIME 18.2 09/03/2008   He is on cholesterol medication and denies myalgias. His cholesterol is not at  goal. The cholesterol last visit was:  He can not tolerate statins.  Lab Results  Component Value Date   CHOL 180 01/05/2018   HDL 55 01/05/2018   LDLCALC 110 (H) 01/05/2018   TRIG 68 01/05/2018   CHOLHDL 3.3 01/05/2018   He has been working on diet and exercise for diet-controlled Diabetes with diabetic chronic kidney disease, he is not on bASA due to coumadin use, he is on ACE/ARB, and denies paresthesia of the feet, polydipsia, polyuria and visual disturbances. Last A1C was:  Lab Results  Component Value Date   HGBA1C 6.4 (H) 01/05/2018   Last GFR Lab Results  Component Value Date   GFRAA 51 (L) 01/19/2018    Patient is on Vitamin D supplement.   Lab Results  Component Value Date   VD25OH 48 01/05/2018       Medication Review: Current Outpatient Medications on File Prior to Visit  Medication Sig Dispense Refill  . acetaminophen (TYLENOL) 500 MG tablet Take 1,000 mg by mouth every 6 (six) hours as needed for moderate pain or headache.     Marland Kitchen amoxicillin (AMOXIL) 500 MG capsule Take 500 mg by mouth 3 (three) times daily. 1 hour prior to dental appt.    . Artificial Tear Solution (SOOTHE XP OP) Place 1 drop into both eyes daily as needed (dry eyes).    . benazepril (LOTENSIN) 20 MG tablet Take 20 mg by mouth at bedtime.    . bisacodyl (DULCOLAX) 5 MG EC tablet Take 10 mg by mouth daily as needed for moderate constipation.    . blood glucose meter kit and supplies KIT Dispense based on insurance preference.check blood sugar 1 time daily. DX-E11.22 1 each 0  . Cholecalciferol (VITAMIN D3) 5000 units CAPS Take 5,000 Units by mouth at bedtime.    Marland Kitchen CINNAMON PO Take 2,000 mg by mouth daily. Takes 2 tablets (2064m ) daily     . ezetimibe (ZETIA) 10 MG tablet Take 10 mg by mouth daily.    . fenofibrate micronized (LOFIBRA) 134 MG capsule Take 134 mg by mouth at bedtime.    .Marland Kitchenloratadine (CLARITIN) 10 MG tablet Take 10 mg by mouth daily as needed for allergies.    . methocarbamol  (ROBAXIN) 500 MG tablet Take 1 tablet (500 mg total) by mouth 3 (three) times daily as needed. 60 tablet 1  . mometasone (NASONEX) 50 MCG/ACT nasal spray Place 2 sprays into the nose daily as needed (allergies).     .Marland KitchenOVER THE COUNTER MEDICATION Apply 1 application topically daily as needed (joint pain). Glucosamine Chondroitin MSM topical cream    . tamsulosin (FLOMAX) 0.4 MG CAPS capsule Take 1 capsule at bedtime for prostate (Patient taking differently: Take 0.4 mg by mouth at bedtime. ) 30 capsule 1  . warfarin (COUMADIN) 5 MG tablet TAKE AS DIRECTED BY COUMADIN CLINIC. (Patient taking differently: Take 5-10 mg by mouth See admin instructions. Take 10 mg at night on Mon and Fri, take 5 mg at night on Tues, Wed, Thurs, Sat and Sun,) 150 tablet 1   No current facility-administered medications on file prior to visit.     Allergies:  Allergies  Allergen Reactions  . Statins Other (See Comments)    muscle aches  . Welchol [Colesevelam Hcl] Other (See Comments)    Muscle weakness    Current Problems (verified) has History of pulmonary embolus (PE); Long term current use of anticoagulant therapy; S/P mitral valve repair; Hyperlipidemia associated with type 2 diabetes mellitus ([place]); Hypertension; Seasonal allergies; Osteoarthritis; Vitamin D deficiency; Medication management; DM type 2 causing CKD stage 3 ([place]); Overweight (BMI 25.0-29.9); Hx of colonic polyps; Encounter for Medicare annual wellness exam; Encounter for therapeutic drug monitoring; CKD stage 3 due to type 2 diabetes mellitus ([place]); and Diabetic retinopathy ([place]) on their problem list.  Screening Tests Immunization History  Administered Date(s) Administered  . Influenza, High Dose Seasonal PF 12/20/2013, 01/14/2015, 01/06/2016, 12/28/2016, 01/05/2018  . Influenza-Unspecified 01/20/2013  . Pneumococcal Conjugate-13 02/23/2016  . Pneumococcal-Unspecified 05/11/2012  . Td 11/22/2003  . Tdap 07/23/2017   Preventative care: Last  colonoscopy: 06/2014 recommended 10 year  CXR 02/2016 DEXA 2010  Prior vaccinations: TD or Tdap: 2019  Influenza: 2019 Pneumococcal: 2014 Prevnar13: 2017 Shingles/Zostavax: Declines due to cost  Names of Other Physician/Practitioners you currently use: 1. [facility] here for primary care 2. Dr. Nicki Reaper, eye doctor, last visit 10/10/2017 - retinopathy- report abstracted 3. Dr. Lovena Le, dentist, last visit 2019  Patient Care Team: Unk Pinto, MD as PCP - General (Internal Medicine) Burnell Blanks, MD as PCP - Cardiology (Cardiology) Irene Shipper, MD as Consulting Physician (Gastroenterology) Burnell Blanks, MD as Consulting Physician (Cardiology)  Surgical: He  has a past surgical history that includes achillis tendon; Hip Arthroplasty; Total hip arthroplasty (08/24/2011); TEE without cardioversion (N/A, 07/04/2012); Cardiac catheterization; Mitral valve repair (Right, 08/15/2012); Intraoprative transesophageal echocardiogram (N/A, 08/15/2012); Colonoscopy; Colonoscopy w/ biopsies and polypectomy; Inguinal hernia repair (Right, 11/07/2015); Insertion of mesh (Right, 11/07/2015); and Shoulder arthroscopy with rotator cuff repair and subacromial decompression (Left, 01/27/2018). Family His family history includes Coronary artery disease in his unknown relative; Diabetes in his brother and sister; Heart attack in his father; Heart attack (age of onset: 5) in his brother; Hyperlipidemia in his sister; Hypertension in his brother, father, and sister. Social history  He reports that he quit smoking about 45 years ago. His smoking use included cigars. His smokeless tobacco use includes chew. He reports that he does not drink alcohol or use drugs.  MEDICARE WELLNESS OBJECTIVES: Physical activity:   Cardiac risk factors:   Depression/mood screen:   Depression screen [facility] 2/9 04/12/2018  Decreased Interest 0  Down, Depressed, Hopeless 0  PHQ  - 2 Score 0  Some recent data might be hidden    ADLs:  In your present state of health, do you have any difficulty performing the following activities: 01/19/2018 01/04/2018  Hearing? N N  Vision? N N  Difficulty concentrating or making decisions? Y N  Comment age related -  Walking or climbing stairs? Y N  Dressing or bathing? N N  Doing errands, shopping? - N  Some recent data might be hidden     Cognitive Testing  Alert? Yes  Normal Appearance?Yes  Oriented to person? Yes  Place? Yes   Time? Yes  Recall of three objects?  Yes  Can perform simple calculations? Yes  Displays appropriate judgment?Yes  Can read the correct time from a watch face?Yes  EOL planning: Does Patient Have a Medical Advance Directive?: Yes Type of Advance Directive: Living will Does patient want to make changes to medical advance directive?: Yes (  MAU/Ambulatory/Procedural Areas - Information given)   Objective:   Today's Vitals   04/12/18 0950  BP: 120/78  Pulse: 79  Temp: 97.9 F (36.6 C)  SpO2: 96%  Weight: 212 lb (96.2 kg)  Height: _0  (1.803 m)  PainSc: 1   PainLoc: Shoulder   Body mass index is 29.57 kg/m.  General appearance: alert, no distress, WD/WN, male HEENT: normocephalic, sclerae anicteric, TMs pearly, nares patent, no discharge or erythema, pharynx normal Oral cavity: MMM, no lesions Neck: supple, no lymphadenopathy, no thyromegaly, no masses Heart: RRR, normal S1, S2, no murmurs Lungs: CTA bilaterally, no wheezes, rhonchi, or rales Abdomen: +bs, soft, non tender, non distended, no masses, no hepatomegaly, no splenomegaly Musculoskeletal: nontender, no swelling, no obvious deformity Extremities: no edema, no cyanosis, no clubbing Pulses: 2+ symmetric, upper and lower extremities, normal cap refill Neurological: alert, oriented x 3, CN2-12 intact, strength normal upper extremities and lower extremities, sensation normal throughout, DTRs 2+ throughout, no cerebellar  signs, gait normal Psychiatric: normal affect, behavior normal, pleasant   Medicare Attestation I have personally reviewed: The patient's medical and social history Their use of alcohol, tobacco or illicit drugs Their current medications and supplements The patient's functional ability including ADLs,fall risks, home safety risks, cognitive, and hearing and visual impairment Diet and physical activities Evidence for depression or mood disorders  The patient's weight, height, BMI, and visual acuity have been recorded in the chart.  I have made referrals, counseling, and provided education to the patient based on review of the above and I have provided the patient with a written personalized care plan for preventive services.     Izora Ribas, NP   04/12/2018

## 2018-04-12 ENCOUNTER — Encounter: Payer: Self-pay | Admitting: Adult Health

## 2018-04-12 ENCOUNTER — Ambulatory Visit (INDEPENDENT_AMBULATORY_CARE_PROVIDER_SITE_OTHER): Payer: 59 | Admitting: Adult Health

## 2018-04-12 VITALS — BP 120/78 | HR 79 | Temp 97.9°F | Ht 71.0 in | Wt 212.0 lb

## 2018-04-12 DIAGNOSIS — J302 Other seasonal allergic rhinitis: Secondary | ICD-10-CM

## 2018-04-12 DIAGNOSIS — Z7901 Long term (current) use of anticoagulants: Secondary | ICD-10-CM | POA: Diagnosis not present

## 2018-04-12 DIAGNOSIS — Z86711 Personal history of pulmonary embolism: Secondary | ICD-10-CM

## 2018-04-12 DIAGNOSIS — Z0000 Encounter for general adult medical examination without abnormal findings: Secondary | ICD-10-CM

## 2018-04-12 DIAGNOSIS — E559 Vitamin D deficiency, unspecified: Secondary | ICD-10-CM | POA: Diagnosis not present

## 2018-04-12 DIAGNOSIS — E663 Overweight: Secondary | ICD-10-CM | POA: Diagnosis not present

## 2018-04-12 DIAGNOSIS — E785 Hyperlipidemia, unspecified: Secondary | ICD-10-CM

## 2018-04-12 DIAGNOSIS — Z79899 Other long term (current) drug therapy: Secondary | ICD-10-CM | POA: Diagnosis not present

## 2018-04-12 DIAGNOSIS — E11319 Type 2 diabetes mellitus with unspecified diabetic retinopathy without macular edema: Secondary | ICD-10-CM

## 2018-04-12 DIAGNOSIS — E1122 Type 2 diabetes mellitus with diabetic chronic kidney disease: Secondary | ICD-10-CM

## 2018-04-12 DIAGNOSIS — N183 Chronic kidney disease, stage 3 (moderate): Secondary | ICD-10-CM | POA: Diagnosis not present

## 2018-04-12 DIAGNOSIS — Z86010 Personal history of colonic polyps: Secondary | ICD-10-CM

## 2018-04-12 DIAGNOSIS — Z9889 Other specified postprocedural states: Secondary | ICD-10-CM | POA: Diagnosis not present

## 2018-04-12 DIAGNOSIS — Z0001 Encounter for general adult medical examination with abnormal findings: Secondary | ICD-10-CM | POA: Diagnosis not present

## 2018-04-12 DIAGNOSIS — I10 Essential (primary) hypertension: Secondary | ICD-10-CM

## 2018-04-12 DIAGNOSIS — M1990 Unspecified osteoarthritis, unspecified site: Secondary | ICD-10-CM | POA: Diagnosis not present

## 2018-04-12 DIAGNOSIS — E1169 Type 2 diabetes mellitus with other specified complication: Secondary | ICD-10-CM | POA: Diagnosis not present

## 2018-04-12 DIAGNOSIS — R6889 Other general symptoms and signs: Secondary | ICD-10-CM

## 2018-04-12 NOTE — Patient Instructions (Addendum)
[name] , Thank you for taking time to come for your Medicare Wellness Visit. I appreciate your ongoing commitment to your health goals. Please review the following plan we discussed and let me know if I can assist you in the future.   These are the goals we discussed: Goals    . Exercise 3x per week (30 min per time)    . HEMOGLOBIN A1C < 7.0    . Weight (lb) < 207 lb (93.9 kg)       This is a list of the screening recommended for you and due dates:  Health Maintenance  Topic Date Due  .  Hepatitis C: One time screening is recommended by Center for Disease Control  (CDC) for  adults born from [birth year] through 1964.   04/13/2019*  . Hemoglobin A1C  07/07/2018  . Eye exam for diabetics  10/11/2018  . Complete foot exam   01/05/2019  . Colon Cancer Screening  07/11/2024  . Tetanus Vaccine  07/24/2027  . Flu Shot  Completed  . Pneumonia vaccines  Completed  *Topic was postponed. The date shown is not the original due date.      Exercise for Older Adults Staying physically active is important as you age. The four types of exercises that are best for older adults are endurance, strength, balance, and flexibility. Contact your health care provider before you start any exercise routine. Ask your health care provider what activities are safe for you. What are the risks? Risks associated with exercising include:  Overdoing it. This may lead to sore muscles or fatigue.  Falls.  Injuries.  Dehydration. How to do these exercises Endurance exercises Endurance (aerobic) exercises raise your breathing rate and heart rate. Increasing your endurance helps you to do everyday tasks and stay healthy. By improving the health of your body system that includes your heart, lungs, and blood vessels (circulatory system), you may also delay or prevent diseases such as heart disease, diabetes, and bone loss (osteoporosis). Types of endurance exercises include:  Sports.  Indoor activities, such as  using gym equipment, doing water aerobics, or dancing.  Outdoor activities, such as biking or jogging.  Tasks around the house, such as gardening, yard work, and heavy household chores like cleaning.  Walking, such as hiking or walking around your neighborhood. When doing endurance exercises, make sure you:  Are aware of your surroundings.  Use safety equipment as directed.  Dress in layers when exercising outdoors.  Drink plenty of water to stay well hydrated. Build up endurance slowly. Start with 10 minutes at a time, and gradually build up to doing 30 minutes at a time. Unless your health care provider gave you different instructions, aim to exercise for a total of 150 minutes a week. Spread out that time so you are working on endurance on 3 or more days a week. Strength exercises Lifting, pulling, or pushing weights helps to strengthen muscles. Having stronger muscles makes it easier to do everyday activities, such as getting up from a chair, climbing stairs, carrying groceries, and playing with grandchildren. Strength exercises include arm and leg exercises that may be done:  With weights.  Without weights (using your own body weight).  With a resistance band. When doing strength exercises:  Move smoothly and steadily. Do not suddenly thrust or jerk the weights, the resistance band, or your body.  Start with no weights or with light weights, and gradually add more weight over time. Eventually, aim to use weights that are  hard or very hard for you to lift. This means that you are able to do 8 repetitions with the weight, and the last few repetitions are very challenging.  Lift or push weights into position for 3 seconds, hold the position for 1 second, and then take 3 seconds to return to your starting position.  Breathe out (exhale) during difficult movements, like lifting or pushing weights. Breathe in (inhale) to relax your muscles before the next repetition.  Consider  alternating arms or legs, especially when you first start strength exercises.  Expect some slight muscle soreness after each session. Do strength exercises on 2 or more days a week, for 30 minutes at a time. Avoid exercising the same muscle groups two days in a row. For example, if you work on your leg muscles one day, work on your arm muscles the next day. When you can do two sets of 10-15 repetitions with a certain weight, increase the amount of weight. Balance Balance exercises can help to prevent falls. Balance exercises include:  Standing on one foot.  Heel-to-toe walk.  Balance walk.  Tai chi. Make sure you have something sturdy to hold onto while doing balance exercises, such as a sturdy chair. As your balance improves, challenge yourself by holding onto the chair with one hand instead of two, and then with no hands. Trying exercises with your eyes closed also challenges your balance, but be sure to have a sturdy surface (like a countertop) close by in case you need it. Do balance exercises as often as you want, or as often as directed by your health care provider. Strength exercises for the lower body also help to improve balance. Flexibility Flexibility exercises improve how far you can bend, straighten, move, or rotate parts of your body (range of motion). These exercises also help you to do everyday activities such as getting dressed or reaching for objects. Flexibility exercises include stretching different parts of the body, and they may be done in a standing or seated position or on the floor. When stretching, make sure you:  Keep a slight bend in your arms and legs. Avoid completely straightening ("locking") your joints.  Do not stretch so far that you feel pain. You should feel a mild stretching feeling. You may try stretching farther as you become more flexible over time.  Relax and breathe between stretches.  Hold onto something sturdy for balance as needed. Hold each  stretch for 10-30 seconds. Repeat each stretch 3-5 times. General safety tips  Exercise in well-lit areas.  Do not hold your breath during exercises or stretches.  Warm up before exercising, and cool down after exercising. This can help prevent injury.  Drink plenty of water during exercise or any activity that makes you sweat.  Use smooth, steady movements. Do not use sudden, jerking movements, especially when lifting weights or doing flexibility exercises.  If you are not sure if an exercise is safe for you, or you are not sure how to do an exercise, talk with your health care provider. This is especially important if you have had surgery on muscles, bones, or joints (orthopedic surgery). Where to find more information You can find more information about exercise for older adults from:  Your local health department, fitness center, or community center. These facilities may have programs for aging adults.  [employer] on Aging: http://kim-miller.com/  National Council on Aging: www.ncoa.org Summary  Staying physically active is important as you age.  Make sure to contact your health  care provider before you start any exercise routine. Ask your health care provider what activities are safe for you.  Doing endurance, strength, balance, and flexibility exercises can help to delay or prevent certain diseases, such as heart disease, diabetes, and bone loss (osteoporosis). This information is not intended to replace advice given to you by your health care provider. Make sure you discuss any questions you have with your health care provider. Document Released: 07/28/2016 Document Revised: 07/28/2016 Document Reviewed: 07/28/2016 Elsevier Interactive Patient Education  2019 [employer].

## 2018-04-13 LAB — LIPID PANEL
CHOLESTEROL: 158 mg/dL (ref <200)
HDL: 51 mg/dL (ref >40)
LDL Cholesterol (Calc): 92 mg/dL (calc)
Non-HDL Cholesterol (Calc): 107 mg/dL (calc) (ref <130)
TRIGLYCERIDES: 64 mg/dL (ref <150)
Total CHOL/HDL Ratio: 3.1 (calc) (ref <5.0)

## 2018-04-13 LAB — HEMOGLOBIN A1C
EAG (MMOL/L): 7.6 (calc)
Hgb A1c MFr Bld: 6.4 % of total Hgb — ABNORMAL HIGH (ref <5.7)
Mean Plasma Glucose: 137 (calc)

## 2018-04-13 LAB — COMPLETE METABOLIC PANEL WITH GFR
AG RATIO: 1.8 (calc) (ref 1.0–2.5)
ALKALINE PHOSPHATASE (APISO): 46 U/L (ref 40–115)
ALT: 15 U/L (ref 9–46)
AST: 21 U/L (ref 10–35)
Albumin: 4.8 g/dL (ref 3.6–5.1)
BILIRUBIN TOTAL: 0.6 mg/dL (ref 0.2–1.2)
BUN/Creatinine Ratio: 16 (calc) (ref 6–22)
BUN: 24 mg/dL (ref 7–25)
CHLORIDE: 103 mmol/L (ref 98–110)
CO2: 25 mmol/L (ref 20–32)
Calcium: 10.1 mg/dL (ref 8.6–10.3)
Creat: 1.54 mg/dL — ABNORMAL HIGH (ref 0.70–1.18)
GFR, EST AFRICAN AMERICAN: 52 mL/min/{1.73_m2} — AB (ref >=60)
GFR, Est Non African American: 45 mL/min/{1.73_m2} — ABNORMAL LOW (ref >=60)
GLUCOSE: 115 mg/dL — AB (ref 65–99)
Globulin: 2.6 g/dL (calc) (ref 1.9–3.7)
POTASSIUM: 4.3 mmol/L (ref 3.5–5.3)
Sodium: 137 mmol/L (ref 135–146)
TOTAL PROTEIN: 7.4 g/dL (ref 6.1–8.1)

## 2018-04-13 LAB — CBC WITH DIFFERENTIAL/PLATELET
ABSOLUTE MONOCYTES: 258 {cells}/uL (ref 200–950)
Basophils Absolute: 41 cells/uL (ref 0–200)
Basophils Relative: 1.2 %
EOS ABS: 58 {cells}/uL (ref 15–500)
Eosinophils Relative: 1.7 %
HCT: 46.3 % (ref 38.5–50.0)
Hemoglobin: 15.6 g/dL (ref 13.2–17.1)
Lymphs Abs: 1353 cells/uL (ref 850–3900)
MCH: 30.5 pg (ref 27.0–33.0)
MCHC: 33.7 g/dL (ref 32.0–36.0)
MCV: 90.4 fL (ref 80.0–100.0)
MPV: 12 fL (ref 7.5–12.5)
Monocytes Relative: 7.6 %
NEUTROS PCT: 49.7 %
Neutro Abs: 1690 cells/uL (ref 1500–7800)
PLATELETS: 252 10*3/uL (ref 140–400)
RBC: 5.12 10*6/uL (ref 4.20–5.80)
RDW: 13.1 % (ref 11.0–15.0)
TOTAL LYMPHOCYTE: 39.8 %
WBC: 3.4 10*3/uL — ABNORMAL LOW (ref 3.8–10.8)

## 2018-04-13 LAB — TSH: TSH: 1.24 mIU/L (ref 0.40–4.50)

## 2018-04-13 LAB — MAGNESIUM: Magnesium: 1.9 mg/dL (ref 1.5–2.5)

## 2018-04-13 LAB — VITAMIN D 25 HYDROXY (VIT D DEFICIENCY, FRACTURES): VIT D 25 HYDROXY: 51 ng/mL (ref 30–100)

## 2018-04-14 DIAGNOSIS — M25512 Pain in left shoulder: Secondary | ICD-10-CM | POA: Diagnosis not present

## 2018-04-17 ENCOUNTER — Other Ambulatory Visit: Payer: Self-pay | Admitting: Internal Medicine

## 2018-04-17 MED FILL — EZETIMIBE 10 MG TABS: 10 | 90 days supply | Qty: 90 | Fill #0

## 2018-04-18 DIAGNOSIS — M25512 Pain in left shoulder: Secondary | ICD-10-CM | POA: Diagnosis not present

## 2018-04-20 DIAGNOSIS — M25512 Pain in left shoulder: Secondary | ICD-10-CM | POA: Diagnosis not present

## 2018-04-26 DIAGNOSIS — M25512 Pain in left shoulder: Secondary | ICD-10-CM | POA: Diagnosis not present

## 2018-04-28 DIAGNOSIS — M25512 Pain in left shoulder: Secondary | ICD-10-CM | POA: Diagnosis not present

## 2018-05-01 DIAGNOSIS — M25512 Pain in left shoulder: Secondary | ICD-10-CM | POA: Diagnosis not present

## 2018-05-02 DIAGNOSIS — Z9889 Other specified postprocedural states: Secondary | ICD-10-CM | POA: Diagnosis not present

## 2018-05-02 DIAGNOSIS — M25512 Pain in left shoulder: Secondary | ICD-10-CM | POA: Diagnosis not present

## 2018-05-03 DIAGNOSIS — M25512 Pain in left shoulder: Secondary | ICD-10-CM | POA: Diagnosis not present

## 2018-05-10 DIAGNOSIS — M25512 Pain in left shoulder: Secondary | ICD-10-CM | POA: Diagnosis not present

## 2018-05-18 DIAGNOSIS — M25512 Pain in left shoulder: Secondary | ICD-10-CM | POA: Diagnosis not present

## 2018-05-24 DIAGNOSIS — M25512 Pain in left shoulder: Secondary | ICD-10-CM | POA: Diagnosis not present

## 2018-05-25 ENCOUNTER — Encounter (INDEPENDENT_AMBULATORY_CARE_PROVIDER_SITE_OTHER): Payer: Self-pay

## 2018-05-25 ENCOUNTER — Ambulatory Visit (INDEPENDENT_AMBULATORY_CARE_PROVIDER_SITE_OTHER): Payer: 59 | Admitting: Pharmacist

## 2018-05-25 DIAGNOSIS — Z9889 Other specified postprocedural states: Secondary | ICD-10-CM

## 2018-05-25 DIAGNOSIS — Z5181 Encounter for therapeutic drug level monitoring: Secondary | ICD-10-CM

## 2018-05-25 DIAGNOSIS — Z7901 Long term (current) use of anticoagulants: Secondary | ICD-10-CM | POA: Diagnosis not present

## 2018-05-25 DIAGNOSIS — Z86711 Personal history of pulmonary embolism: Secondary | ICD-10-CM

## 2018-05-25 DIAGNOSIS — I2699 Other pulmonary embolism without acute cor pulmonale: Secondary | ICD-10-CM

## 2018-05-25 LAB — POCT INR: INR: 2.4 (ref 2.0–3.0)

## 2018-05-25 NOTE — Patient Instructions (Signed)
Description   Continue 1 tablet daily except 2 tablets on Mondays and Fridays. Recheck in 6 weeks.  Please call with any medication changes or if scheduled for any procedures [facility]# [phone]. Main # [phone].

## 2018-05-26 DIAGNOSIS — M25512 Pain in left shoulder: Secondary | ICD-10-CM | POA: Diagnosis not present

## 2018-05-30 DIAGNOSIS — M25512 Pain in left shoulder: Secondary | ICD-10-CM | POA: Diagnosis not present

## 2018-06-01 DIAGNOSIS — M25512 Pain in left shoulder: Secondary | ICD-10-CM | POA: Diagnosis not present

## 2018-06-13 ENCOUNTER — Other Ambulatory Visit: Payer: Self-pay | Admitting: Cardiovascular Disease

## 2018-06-13 DIAGNOSIS — M25512 Pain in left shoulder: Secondary | ICD-10-CM | POA: Diagnosis not present

## 2018-06-14 MED FILL — FENOFIBRATE 134 MG CAPSULE: 134 | 90 days supply | Qty: 90 | Fill #0 | Status: TO

## 2018-06-14 MED FILL — WARFARIN SODIUM 5 MG TABLET: 5 | 90 days supply | Qty: 150 | Fill #0 | Status: TO

## 2018-06-20 DIAGNOSIS — Z9889 Other specified postprocedural states: Secondary | ICD-10-CM | POA: Diagnosis not present

## 2018-06-20 DIAGNOSIS — M25512 Pain in left shoulder: Secondary | ICD-10-CM | POA: Diagnosis not present

## 2018-07-04 ENCOUNTER — Telehealth: Payer: Self-pay

## 2018-07-04 NOTE — Telephone Encounter (Signed)

## 2018-07-05 ENCOUNTER — Ambulatory Visit (INDEPENDENT_AMBULATORY_CARE_PROVIDER_SITE_OTHER): Payer: 59 | Admitting: Pharmacist

## 2018-07-05 ENCOUNTER — Other Ambulatory Visit: Payer: Self-pay

## 2018-07-05 ENCOUNTER — Telehealth: Payer: Self-pay | Admitting: Cardiovascular Disease

## 2018-07-05 DIAGNOSIS — Z5181 Encounter for therapeutic drug level monitoring: Secondary | ICD-10-CM | POA: Diagnosis not present

## 2018-07-05 DIAGNOSIS — Z9889 Other specified postprocedural states: Secondary | ICD-10-CM

## 2018-07-05 DIAGNOSIS — Z86711 Personal history of pulmonary embolism: Secondary | ICD-10-CM

## 2018-07-05 DIAGNOSIS — Z7901 Long term (current) use of anticoagulants: Secondary | ICD-10-CM | POA: Diagnosis not present

## 2018-07-05 DIAGNOSIS — I2699 Other pulmonary embolism without acute cor pulmonale: Secondary | ICD-10-CM

## 2018-07-05 LAB — POCT INR: INR: 2.3 (ref 2.0–3.0)

## 2018-07-05 NOTE — Telephone Encounter (Signed)
See anticoag encounter

## 2018-07-05 NOTE — Telephone Encounter (Signed)
New Message ° ° °Patient returning your call please give him a call back. °

## 2018-07-11 ENCOUNTER — Other Ambulatory Visit: Payer: Self-pay

## 2018-07-11 ENCOUNTER — Ambulatory Visit (INDEPENDENT_AMBULATORY_CARE_PROVIDER_SITE_OTHER): Payer: 59 | Admitting: Internal Medicine

## 2018-07-11 ENCOUNTER — Encounter: Payer: Self-pay | Admitting: Internal Medicine

## 2018-07-11 ENCOUNTER — Other Ambulatory Visit: Payer: Self-pay | Admitting: *Deleted

## 2018-07-11 VITALS — BP 124/62 | HR 48 | Temp 97.0°F | Resp 16 | Ht 72.5 in | Wt 216.6 lb

## 2018-07-11 DIAGNOSIS — Z79899 Other long term (current) drug therapy: Secondary | ICD-10-CM | POA: Diagnosis not present

## 2018-07-11 DIAGNOSIS — N183 Chronic kidney disease, stage 3 unspecified: Secondary | ICD-10-CM

## 2018-07-11 DIAGNOSIS — Z9889 Other specified postprocedural states: Secondary | ICD-10-CM

## 2018-07-11 DIAGNOSIS — E1122 Type 2 diabetes mellitus with diabetic chronic kidney disease: Secondary | ICD-10-CM

## 2018-07-11 DIAGNOSIS — E782 Mixed hyperlipidemia: Secondary | ICD-10-CM

## 2018-07-11 DIAGNOSIS — E559 Vitamin D deficiency, unspecified: Secondary | ICD-10-CM

## 2018-07-11 DIAGNOSIS — I10 Essential (primary) hypertension: Secondary | ICD-10-CM | POA: Diagnosis not present

## 2018-07-11 DIAGNOSIS — N401 Enlarged prostate with lower urinary tract symptoms: Secondary | ICD-10-CM

## 2018-07-11 MED ORDER — TAMSULOSIN HCL 0.4 MG PO CAPS: ORAL_CAPSULE | ORAL | 1 refills | Status: DC

## 2018-07-11 NOTE — Progress Notes (Signed)
[facility]  Unk Pinto, M.D.  Uvaldo Bristle. Silverio Lay, P.A.-C Liane Comber, [place] [address] [place] [address] Telephone [phone] Telefax [phone]   History of Present Illness:      This very nice 73 y.o. MBM  presents for 6 month follow up with HTN, HLD, Pre-Diabetes and Vitamin D Deficiency.       Patient had Left shoulder surgery in Nov 2019 by Dr Veverly Fells.       Patient is treated for HTN (2005)  & BP has been controlled at home. Today's BP is at goal - 124/62. Heart Cath in 2009 was Negative for CAD. In 2010 he had a PE and after a 2sd PE in 2012 , he's been on Coumadin. In 2014 he had a mitral valvuloplasty by Dr Ricard Dillon.   Patient has had no complaints of any cardiac type chest pain, palpitations, dyspnea / orthopnea / PND, dizziness, claudication, or dependent edema.      Patient alleges intolerance to Statins and Wellchol and his hyperlipidemia is not  controlled with diet & Zetia Patient denies myalgias or other med SE's. Last Lipids were  Lab Results  Component Value Date   CHOL 158 04/12/2018   HDL 51 04/12/2018   LDLCALC 92 04/12/2018   TRIG 64 04/12/2018   CHOLHDL 3.1 04/12/2018        Also, the patient has history of T2_NIDDM (2002) w/ CKD3 and has had no symptoms of reactive hypoglycemia, diabetic polys, paresthesias or visual blurring.  Last A1c was not at goal: Lab Results  Component Value Date   HGBA1C 6.4 (H) 04/12/2018       Further, the patient also has history of Vitamin D Deficiency ("30" / 2013)  and supplements vitamin D without any suspected side-effects. Last vitamin D was sl low (goal is 70-100): Lab Results  Component Value Date   VD25OH 51 04/12/2018   Current Outpatient Medications on File Prior to Visit  Medication Sig  . acetaminophen (TYLENOL) 500 MG tablet Take 1,000 mg by mouth every 6 (six) hours as needed for moderate pain or headache.   Marland Kitchen  amoxicillin (AMOXIL) 500 MG capsule Take 500 mg by mouth 3 (three) times daily. 1 hour prior to dental appt.  . Artificial Tear Solution (SOOTHE XP OP) Place 1 drop into both eyes daily as needed (dry eyes).  . benazepril (LOTENSIN) 20 MG tablet Take 20 mg by mouth at bedtime.  . bisacodyl (DULCOLAX) 5 MG EC tablet Take 10 mg by mouth daily as needed for moderate constipation.  . blood glucose meter kit and supplies KIT Dispense based on insurance preference.check blood sugar 1 time daily. [id number]  . Cholecalciferol (VITAMIN D3) 5000 units CAPS Take 5,000 Units by mouth at bedtime.  Marland Kitchen CINNAMON PO Take 2,000 mg by mouth daily. Takes 2 tablets (2019m ) daily   . ezetimibe (ZETIA) 10 MG tablet TAKE 1 TABLET BY MOUTH DAILY FOR CHOLESTEROL  . fenofibrate micronized (LOFIBRA) 134 MG capsule Take 134 mg by mouth at bedtime.  .Marland Kitchenloratadine (CLARITIN) 10 MG tablet Take 10 mg by mouth daily as needed for allergies.  . methocarbamol (ROBAXIN) 500 MG tablet Take 1 tablet (500 mg total) by mouth 3 (three) times daily as needed.  . mometasone (NASONEX) 50 MCG/ACT nasal spray Place 2 sprays into the nose daily as needed (allergies).   .Marland KitchenOVER THE COUNTER MEDICATION Apply 1 application topically daily as needed (joint pain).  Glucosamine Chondroitin MSM topical cream  . warfarin (COUMADIN) 5 MG tablet TAKE AS DIRECTED BY COUMADIN CLINIC. (90 DAY SUPPLY)   No current facility-administered medications on file prior to visit.    Allergies  Allergen Reactions  . Statins Other (See Comments)    muscle aches  . Welchol [Colesevelam Hcl] Other (See Comments)    Muscle weakness   PMHx:   Past Medical History:  Diagnosis Date  . Allergy    SEASONAL  . Cataract    SMALL IN LEFT EYE  . CKD (chronic kidney disease), stage III ([place])   . Coronary artery disease    a. Cath 2014 - mild nonobstructive CAD - 40% prox LAD, 30% OM2, otherwise mild irregularities.  . Degenerative joint disease   . Headache   .  Hyperlipidemia   . Hypertension   . Inguinal hernia    right  . Osteoarthritis   . Pre-diabetes   . Pulmonary embolism [facility])    march 2010, 2012              /   DVT x  2  LEFT 2010  . S/P mitral valve repair 08/15/2012   Complex valvuloplasty including triangular resection of posterior leaflet, artificial Gore-tex neocord placement x4 and Sorin Memo 3D ring annuloplasty via right mini thoracotomy approach  . Seasonal allergies   . Severe mitral regurgitation by prior echocardiogram    a. s/p MV repair 2014.  . Vitamin D deficiency   . Wears glasses    Immunization History  Administered Date(s) Administered  . Influenza, High Dose Seasonal PF 12/20/2013, 01/14/2015, 01/06/2016, 12/28/2016, 01/05/2018  . Influenza-Unspecified 01/20/2013  . Pneumococcal Conjugate-13 02/23/2016  . Pneumococcal-Unspecified 05/11/2012  . Td 11/22/2003  . Tdap 07/23/2017   Past Surgical History:  Procedure Laterality Date  . achillis tendon     repair 20 years ago  . CARDIAC CATHETERIZATION    . COLONOSCOPY    . COLONOSCOPY W/ BIOPSIES AND POLYPECTOMY    . HIP ARTHROPLASTY     11/06/07  . INGUINAL HERNIA REPAIR Right 11/07/2015   Procedure: LAPAROSCOPIC RIGHT INGUINAL HERNIA WITH MESH;  Surgeon: Ralene Ok, MD;  Location: [facility];  Service: General;  Laterality: Right;  . INSERTION OF MESH Right 11/07/2015   Procedure: INSERTION OF MESH;  Surgeon: Ralene Ok, MD;  Location: [facility] [facility];  Service: General;  Laterality: Right;  . INTRAOPERATIVE TRANSESOPHAGEAL ECHOCARDIOGRAM N/A 08/15/2012   Procedure: INTRAOPERATIVE TRANSESOPHAGEAL ECHOCARDIOGRAM;  Surgeon: Rexene Alberts, MD;  Location: [facility];  Service: Open Heart Surgery;  Laterality: N/A;  . MITRAL VALVE REPAIR Right 08/15/2012   Procedure: MINIMALLY INVASIVE MITRAL VALVE REPAIR (MVR);  Surgeon: Rexene Alberts, MD;  Location: [facility];  Service: Open Heart Surgery;  Laterality: Right;  . SHOULDER ARTHROSCOPY WITH ROTATOR CUFF REPAIR AND SUBACROMIAL  DECOMPRESSION Left 01/27/2018   Procedure: LEFT SHOULDER ARTHROSCOPY, SUBACROMIAL DECOMPRESSION, OPEN ROTATOR CUFF REPAIR, OPEN DISTAL CLAVICLE RESECTION, OPEN BICEPS TENODESIS;  Surgeon: Netta Cedars, MD;  Location: [facility];  Service: Orthopedics;  Laterality: Left;  . TEE WITHOUT CARDIOVERSION N/A 07/04/2012   Procedure: TRANSESOPHAGEAL ECHOCARDIOGRAM (TEE);  Surgeon: Larey Dresser, MD;  Location: [facility];  Service: Cardiovascular;  Laterality: N/A;  . TOTAL HIP ARTHROPLASTY  08/24/2011   Procedure: TOTAL HIP ARTHROPLASTY ANTERIOR APPROACH;  Surgeon: Mauri Pole, MD;  Location: [facility];  Service: Orthopedics;  Laterality: Right;   FHx:    Reviewed / unchanged  SHx:    Reviewed / unchanged  Systems Review:  Constitutional: Denies fever, chills, wt changes, headaches, insomnia, fatigue, night sweats, change in appetite. Eyes: Denies redness, blurred vision, diplopia, discharge, itchy, watery eyes.  ENT: Denies discharge, congestion, post nasal drip, epistaxis, sore throat, earache, hearing loss, dental pain, tinnitus, vertigo, sinus pain, snoring.  CV: Denies chest pain, palpitations, irregular heartbeat, syncope, dyspnea, diaphoresis, orthopnea, PND, claudication or edema. Respiratory: denies cough, dyspnea, DOE, pleurisy, hoarseness, laryngitis, wheezing.  Gastrointestinal: Denies dysphagia, odynophagia, heartburn, reflux, water brash, abdominal pain or cramps, nausea, vomiting, bloating, diarrhea, constipation, hematemesis, melena, hematochezia  or hemorrhoids. Genitourinary: Denies dysuria, frequency, urgency, nocturia, hesitancy, discharge, hematuria or flank pain. Musculoskeletal: Denies arthralgias, myalgias, stiffness, jt. swelling, pain, limping or strain/sprain.  Skin: Denies pruritus, rash, hives, warts, acne, eczema or change in skin lesion(s). Neuro: No weakness, tremor, incoordination, spasms, paresthesia or pain. Psychiatric: Denies confusion, memory loss or sensory  loss. Endo: Denies change in weight, skin or hair change.  Heme/Lymph: No excessive bleeding, bruising or enlarged lymph nodes.  Physical Exam  BP 124/62   Pulse (!) 48   Temp (!) 97 F (36.1 C)   Resp 16   Ht 6' 0.5" (1.842 m)   Wt 216 lb 9.6 oz (98.2 kg)   BMI 28.97 kg/m   Appears  well nourished, well groomed  and in no distress.  Eyes: PERRLA, EOMs, conjunctiva no swelling or erythema. Sinuses: No frontal/maxillary tenderness ENT/Mouth: EAC's clear, TM's nl w/o erythema, bulging. Nares clear w/o erythema, swelling, exudates. Oropharynx clear without erythema or exudates. Oral hygiene is good. Tongue normal, non obstructing. Hearing intact.  Neck: Supple. Thyroid not palpable. Car 2+/2+ without bruits, nodes or JVD. Chest: Respirations nl with BS clear & equal w/o rales, rhonchi, wheezing or stridor.  Cor: Heart sounds normal w/ regular rate and rhythm without sig. murmurs, gallops, clicks or rubs. Peripheral pulses normal and equal  without edema.  Abdomen: Soft & bowel sounds normal. Non-tender w/o guarding, rebound, hernias, masses or organomegaly.  Lymphatics: Unremarkable.  Musculoskeletal: Full ROM all peripheral extremities, joint stability, 5/5 strength and normal gait.  Skin: Warm, dry without exposed rashes, lesions or ecchymosis apparent.  Neuro: Cranial nerves intact, reflexes equal bilaterally. Sensory-motor testing grossly intact. Tendon reflexes grossly intact.  Pysch: Alert & oriented x 3.  Insight and judgement nl & appropriate. No ideations.  Assessment and Plan:  1. Essential hypertension  - Continue medication, monitor blood pressure at home.  - Continue DASH diet.  Reminder to go to the ER if any CP,  SOB, nausea, dizziness, severe HA, changes vision/speech.  - CBC with Differential/Platelet - COMPLETE METABOLIC PANEL WITH GFR - Magnesium - TSH  2. Hyperlipidemia, mixed  - Continue diet/meds, exercise,& lifestyle modifications.  - Continue  monitor periodic cholesterol/liver & renal functions   - Lipid panel - TSH  3. Type 2 diabetes mellitus with stage 3 chronic kidney disease, without long-term current use of insulin (HCC)  - Continue diet, exercise  - Lifestyle modifications.  - Monitor appropriate labs.  - Hemoglobin A1c - Insulin, random  4. Vitamin D deficiency  - Continue supplementation.  - VITAMIN D 25 Hydroxyl  5. S/P mitral valve repair  6. Medication management  - CBC with Differential/Platelet - COMPLETE METABOLIC PANEL WITH GFR - Magnesium - Lipid panel - TSH - Hemoglobin A1c - Insulin, random - VITAMIN D 25 Hydroxyl       Discussed  regular exercise, BP monitoring, weight control to achieve/maintain BMI less than 25 and discussed med and SE's.  Recommended labs to assess and monitor clinical status with further disposition pending results of labs. I discussed the assessment and treatment plan with the patient. The patient was provided an opportunity to ask questions and all were answered. The patient agreed with the plan and demonstrated an understanding of the instructions. I provided  over 40 minutes of exam, counseling, chart review and  complex critical decision making was performed   Kirtland Bouchard, MD

## 2018-07-11 NOTE — Patient Instructions (Signed)

## 2018-07-12 LAB — MAGNESIUM: Magnesium: 1.8 mg/dL (ref 1.5–2.5)

## 2018-07-12 LAB — COMPLETE METABOLIC PANEL WITH GFR
AG Ratio: 2 (calc) (ref 1.0–2.5)
ALT: 17 U/L (ref 9–46)
AST: 21 U/L (ref 10–35)
Albumin: 4.7 g/dL (ref 3.6–5.1)
Alkaline phosphatase (APISO): 44 U/L (ref 35–144)
BUN/Creatinine Ratio: 15 (calc) (ref 6–22)
BUN: 22 mg/dL (ref 7–25)
CO2: 25 mmol/L (ref 20–32)
Calcium: 9.8 mg/dL (ref 8.6–10.3)
Chloride: 104 mmol/L (ref 98–110)
Creat: 1.5 mg/dL — ABNORMAL HIGH (ref 0.70–1.18)
GFR, Est African American: 54 mL/min/{1.73_m2} — ABNORMAL LOW (ref >=60)
GFR, Est Non African American: 46 mL/min/{1.73_m2} — ABNORMAL LOW (ref >=60)
Globulin: 2.3 g/dL (calc) (ref 1.9–3.7)
Glucose, Bld: 116 mg/dL — ABNORMAL HIGH (ref 65–99)
Potassium: 4.4 mmol/L (ref 3.5–5.3)
Sodium: 139 mmol/L (ref 135–146)
Total Bilirubin: 0.6 mg/dL (ref 0.2–1.2)
Total Protein: 7 g/dL (ref 6.1–8.1)

## 2018-07-12 LAB — LIPID PANEL
Cholesterol: 169 mg/dL (ref <200)
HDL: 49 mg/dL (ref >=40)
LDL Cholesterol (Calc): 102 mg/dL (calc) — ABNORMAL HIGH
Non-HDL Cholesterol (Calc): 120 mg/dL (calc) (ref <130)
Total CHOL/HDL Ratio: 3.4 (calc) (ref <5.0)
Triglycerides: 89 mg/dL (ref <150)

## 2018-07-12 LAB — CBC WITH DIFFERENTIAL/PLATELET
Absolute Monocytes: 320 cells/uL (ref 200–950)
Basophils Absolute: 59 cells/uL (ref 0–200)
Basophils Relative: 1.5 %
Eosinophils Absolute: 90 cells/uL (ref 15–500)
Eosinophils Relative: 2.3 %
HCT: 45.9 % (ref 38.5–50.0)
Hemoglobin: 15.6 g/dL (ref 13.2–17.1)
Lymphs Abs: 1611 cells/uL (ref 850–3900)
MCH: 30.6 pg (ref 27.0–33.0)
MCHC: 34 g/dL (ref 32.0–36.0)
MCV: 90.2 fL (ref 80.0–100.0)
MPV: 12.3 fL (ref 7.5–12.5)
Monocytes Relative: 8.2 %
Neutro Abs: 1821 cells/uL (ref 1500–7800)
Neutrophils Relative %: 46.7 %
Platelets: 247 10*3/uL (ref 140–400)
RBC: 5.09 10*6/uL (ref 4.20–5.80)
RDW: 13.3 % (ref 11.0–15.0)
Total Lymphocyte: 41.3 %
WBC: 3.9 10*3/uL (ref 3.8–10.8)

## 2018-07-12 LAB — HEMOGLOBIN A1C
Hgb A1c MFr Bld: 6.4 % of total Hgb — ABNORMAL HIGH (ref <5.7)
Mean Plasma Glucose: 137 (calc)
eAG (mmol/L): 7.6 (calc)

## 2018-07-12 LAB — TSH: TSH: 1.2 mIU/L (ref 0.40–4.50)

## 2018-07-12 LAB — INSULIN, RANDOM: Insulin: 6.6 u[IU]/mL

## 2018-07-12 LAB — VITAMIN D 25 HYDROXY (VIT D DEFICIENCY, FRACTURES): Vit D, 25-Hydroxy: 53 ng/mL (ref 30–100)

## 2018-07-14 ENCOUNTER — Ambulatory Visit: Payer: Self-pay | Admitting: Internal Medicine

## 2018-07-20 MED FILL — EZETIMIBE 10 MG TABS: 10 | 90 days supply | Qty: 90 | Fill #0 | Status: TO

## 2018-08-11 ENCOUNTER — Other Ambulatory Visit: Payer: Self-pay | Admitting: Internal Medicine

## 2018-08-12 MED FILL — BENAZEPRIL HCL 20 MG TABLET: 20 | 90 days supply | Qty: 90 | Fill #0

## 2018-08-23 ENCOUNTER — Telehealth: Payer: Self-pay

## 2018-08-23 NOTE — Telephone Encounter (Signed)
lmom to move appt and for prescreen in office aware

## 2018-08-25 MED FILL — FENOFIBRATE 134 MG CAPSULE: 134 | 90 days supply | Qty: 90 | Fill #0

## 2018-08-25 MED FILL — WARFARIN SODIUM 5 MG TABLET: 5 | 90 days supply | Qty: 150 | Fill #0

## 2018-08-25 NOTE — Telephone Encounter (Signed)

## 2018-08-30 ENCOUNTER — Other Ambulatory Visit: Payer: Self-pay

## 2018-08-30 ENCOUNTER — Ambulatory Visit (INDEPENDENT_AMBULATORY_CARE_PROVIDER_SITE_OTHER): Payer: 59 | Admitting: *Deleted

## 2018-08-30 DIAGNOSIS — Z7901 Long term (current) use of anticoagulants: Secondary | ICD-10-CM

## 2018-08-30 DIAGNOSIS — Z9889 Other specified postprocedural states: Secondary | ICD-10-CM | POA: Diagnosis not present

## 2018-08-30 DIAGNOSIS — I2699 Other pulmonary embolism without acute cor pulmonale: Secondary | ICD-10-CM

## 2018-08-30 LAB — POCT INR: INR: 3.2 — AB (ref 2.0–3.0)

## 2018-08-30 NOTE — Patient Instructions (Signed)
Description   Hold tonight, then continue 1 tablet daily except 2 tablets on Mondays and Fridays. Recheck in 3 weeks. Please call with any medication changes or if scheduled for any procedures [facility]# [phone]. Main # [phone].

## 2018-09-13 ENCOUNTER — Telehealth: Payer: Self-pay

## 2018-09-13 NOTE — Telephone Encounter (Signed)

## 2018-09-20 ENCOUNTER — Other Ambulatory Visit: Payer: Self-pay

## 2018-09-20 ENCOUNTER — Ambulatory Visit (INDEPENDENT_AMBULATORY_CARE_PROVIDER_SITE_OTHER): Payer: 59 | Admitting: Pharmacist

## 2018-09-20 DIAGNOSIS — Z86711 Personal history of pulmonary embolism: Secondary | ICD-10-CM

## 2018-09-20 DIAGNOSIS — Z7901 Long term (current) use of anticoagulants: Secondary | ICD-10-CM | POA: Diagnosis not present

## 2018-09-20 DIAGNOSIS — I2699 Other pulmonary embolism without acute cor pulmonale: Secondary | ICD-10-CM | POA: Diagnosis not present

## 2018-09-20 DIAGNOSIS — Z9889 Other specified postprocedural states: Secondary | ICD-10-CM

## 2018-09-20 DIAGNOSIS — Z5181 Encounter for therapeutic drug level monitoring: Secondary | ICD-10-CM

## 2018-09-20 LAB — POCT INR: INR: 3 (ref 2.0–3.0)

## 2018-09-20 NOTE — Progress Notes (Signed)
elq

## 2018-09-20 NOTE — Patient Instructions (Signed)
Description   Continue 1 tablet daily except 2 tablets on Mondays and Fridays. Recheck in 4 weeks - pt wants to change to Eliquis at next visit. Please call with any medication changes or if scheduled for any procedures [facility]# [phone]. Main # [phone].

## 2018-09-30 MED FILL — EZETIMIBE 10 MG TABS: 10 | 90 days supply | Qty: 90 | Fill #0

## 2018-10-09 NOTE — Progress Notes (Signed)
FOLLOW UP  Assessment and Plan:   Hypertension Well controlled with current medications  Monitor blood pressure at home; patient to call if consistently greater than 130/80 Continue DASH diet.   Reminder to go to the ER if any CP, SOB, nausea, dizziness, severe HA, changes vision/speech, left arm numbness and tingling and jaw pain.  Cholesterol Currently above goal; statin intolerant; continue zetia/fenofibrate, diet emphasized Continue low cholesterol diet and exercise.  Check lipid panel.   Diabetes with diabetic chronic kidney disease Continue medication: lifestyle  Continue diet and exercise.  Perform daily foot/skin check, notify office of any concerning changes.  Check A1C  Overweight Long discussion about weight loss, diet, and exercise Recommended diet heavy in fruits and veggies and low in animal meats, cheeses, and dairy products, appropriate calorie intake Discussed ideal weight for height  Will follow up in 3 months  Vitamin D Def Below goal at last visit; continue supplementation to maintain goal of 70-100 Defer Vit D level  Continue diet and meds as discussed. Further disposition pending results of labs. Discussed med's effects and SE's.   Over 30 minutes of exam, counseling, chart review, and critical decision making was performed.   Future Appointments  Date Time Provider [place]  10/18/2018  9:15 AM [facility]  01/10/2019 10:00 AM Unk Pinto, MD [facility] None  04/16/2019 10:00 AM Liane Comber, NP [facility] None    ----------------------------------------------------------------------------------------------------------------------  HPI 73 y.o. male  presents for 3 month follow up on hypertension, cholesterol, diabetes, obesity and vitamin D deficiency.   He had left arthroscopic surgery by Dr. Veverly Fells on 01/27/2018 and doing fairly since, still some pain if he picks up very heavy items.    Patient has hx/o PE in 2010 and 2012 and has been on Coumadin since 2012 followed at [facility]. Apparently discussing transition to xarelto due to labile INR.   Heart Cath in 2009 was Negative for CAD. In 2014 he had a mitral valvuloplasty by Dr Ricard Dillon for severe MR.   BMI is Body mass index is 29.72 kg/m., he has been working on diet, but admits laying around and not exercising much.  Wt Readings from Last 3 Encounters:  10/10/18 222 lb 3.2 oz (100.8 kg)  07/11/18 216 lb 9.6 oz (98.2 kg)  04/12/18 212 lb (96.2 kg)   His blood pressure has been controlled at home, today their BP is BP: 122/84  He does workout. He denies chest pain, shortness of breath, dizziness.   He is on cholesterol medication (zetia, fenofibrate due to statin intolerance) and denies myalgias. His cholesterol is not at goal. The cholesterol last visit was:   Lab Results  Component Value Date   CHOL 169 07/11/2018   HDL 49 07/11/2018   LDLCALC 102 (H) 07/11/2018   TRIG 89 07/11/2018   CHOLHDL 3.4 07/11/2018    He has been working on diet and exercise for T2 diabetes (currently borderline and treated by lifestyle only), and denies foot ulcerations, increased appetite, nausea, paresthesia of the feet, polydipsia, polyuria, vomiting and weight loss. He does have glucometer but not using. Last A1C in the office was:  Lab Results  Component Value Date   HGBA1C 6.4 (H) 07/11/2018   Lab Results  Component Value Date   GFRAA 21 (L) 07/11/2018   Patient is on Vitamin D supplement, taking 5000 IU daily Lab Results  Component Value Date   VD25OH 53 07/11/2018        Current Medications:  Current Outpatient Medications on File Prior to Visit  Medication Sig  . acetaminophen (TYLENOL) 500 MG tablet Take 1,000 mg by mouth every 6 (six) hours as needed for moderate pain or headache.   Marland Kitchen amoxicillin (AMOXIL) 500 MG capsule Take 500 mg by mouth 3 (three) times daily. 1 hour prior to dental appt.  .  Artificial Tear Solution (SOOTHE XP OP) Place 1 drop into both eyes daily as needed (dry eyes).  . benazepril (LOTENSIN) 20 MG tablet TAKE 1 TABLET BY MOUTH ONCE DAILY  . bisacodyl (DULCOLAX) 5 MG EC tablet Take 10 mg by mouth daily as needed for moderate constipation.  . blood glucose meter kit and supplies KIT Dispense based on insurance preference.check blood sugar 1 time daily. DX-E11.22  . Cholecalciferol (VITAMIN D3) 5000 units CAPS Take 5,000 Units by mouth at bedtime.  Marland Kitchen CINNAMON PO Take 2,000 mg by mouth daily. Takes 2 tablets (2049m ) daily   . ezetimibe (ZETIA) 10 MG tablet TAKE 1 TABLET BY MOUTH DAILY FOR CHOLESTEROL  . fenofibrate micronized (LOFIBRA) 134 MG capsule Take 134 mg by mouth at bedtime.  .Marland Kitchenloratadine (CLARITIN) 10 MG tablet Take 10 mg by mouth daily as needed for allergies.  . mometasone (NASONEX) 50 MCG/ACT nasal spray Place 2 sprays into the nose daily as needed (allergies).   .Marland KitchenOVER THE COUNTER MEDICATION Apply 1 application topically daily as needed (joint pain). Glucosamine Chondroitin MSM topical cream  . tamsulosin (FLOMAX) 0.4 MG CAPS capsule Take 1 capsule at bedtime for prostate  . warfarin (COUMADIN) 5 MG tablet TAKE AS DIRECTED BY COUMADIN CLINIC. (90 DAY SUPPLY)  . methocarbamol (ROBAXIN) 500 MG tablet Take 1 tablet (500 mg total) by mouth 3 (three) times daily as needed. (Patient not taking: Reported on 10/10/2018)   No current facility-administered medications on file prior to visit.      Allergies:  Allergies  Allergen Reactions  . Statins Other (See Comments)    muscle aches  . Welchol [Colesevelam Hcl] Other (See Comments)    Muscle weakness     Medical History:  Past Medical History:  Diagnosis Date  . Allergy    SEASONAL  . Cataract    SMALL IN LEFT EYE  . CKD (chronic kidney disease), stage III ([facility]   . Coronary artery disease    a. Cath 2014 - mild nonobstructive CAD - 40% prox LAD, 30% OM2, otherwise mild irregularities.  .  Degenerative joint disease   . Headache   . Hyperlipidemia   . Hypertension   . Inguinal hernia    right  . Osteoarthritis   . Pre-diabetes   . Pulmonary embolism ([facility]    march 2010, 2012              /   DVT x  2  LEFT 2010  . S/P mitral valve repair 08/15/2012   Complex valvuloplasty including triangular resection of posterior leaflet, artificial Gore-tex neocord placement x4 and Sorin Memo 3D ring annuloplasty via right mini thoracotomy approach  . Seasonal allergies   . Severe mitral regurgitation by prior echocardiogram    a. s/p MV repair 2014.  . Vitamin D deficiency   . Wears glasses    Family history- Reviewed and unchanged Social history- Reviewed and unchanged   Review of Systems:  Review of Systems  Constitutional: Negative for malaise/fatigue and weight loss.  HENT: Negative for hearing loss and tinnitus.   Eyes: Negative for blurred vision and double vision.  Respiratory: Negative  for cough, shortness of breath and wheezing.   Cardiovascular: Negative for chest pain, palpitations, orthopnea, claudication and leg swelling.  Gastrointestinal: Negative for abdominal pain, blood in stool, constipation, diarrhea, heartburn, melena, nausea and vomiting.  Genitourinary: Negative.   Musculoskeletal: Negative for joint pain and myalgias.  Skin: Negative for rash.  Neurological: Negative for dizziness, tingling, sensory change, weakness and headaches.  Endo/Heme/Allergies: Negative for polydipsia.  Psychiatric/Behavioral: Negative.   All other systems reviewed and are negative.     Physical Exam: BP 122/84   Pulse 83   Temp 97.9 F (36.6 C)   Wt 222 lb 3.2 oz (100.8 kg)   SpO2 97%   BMI 29.72 kg/m  Wt Readings from Last 3 Encounters:  10/10/18 222 lb 3.2 oz (100.8 kg)  07/11/18 216 lb 9.6 oz (98.2 kg)  04/12/18 212 lb (96.2 kg)   General Appearance: Well nourished, in no apparent distress. Eyes: PERRLA, EOMs, conjunctiva no swelling or erythema Sinuses: No  Frontal/maxillary tenderness ENT/Mouth: Ext aud canals clear, TMs without erythema, bulging. No erythema, swelling, or exudate on post pharynx.  Tonsils not swollen or erythematous. Hearing normal.  Neck: Supple, thyroid normal.  Respiratory: Respiratory effort normal, BS equal bilaterally without rales, rhonchi, wheezing or stridor.  Cardio: Irregularly irregular. Brisk peripheral pulses without edema.  Abdomen: Soft, + BS.  Non tender, no guarding, rebound, hernias, masses. Lymphatics: Non tender without lymphadenopathy.  Musculoskeletal: Full ROM, 5/5 strength, Slow antalgic gait Skin: Warm, dry without rashes, lesions, ecchymosis.  Neuro: Cranial nerves intact. No cerebellar symptoms.  Psych: Awake and oriented X 3, normal affect, Insight and Judgment appropriate.    Izora Ribas, NP 9:00 AM [facility]

## 2018-10-10 ENCOUNTER — Ambulatory Visit (INDEPENDENT_AMBULATORY_CARE_PROVIDER_SITE_OTHER): Payer: 59 | Admitting: Adult Health

## 2018-10-10 ENCOUNTER — Other Ambulatory Visit: Payer: Self-pay | Admitting: Adult Health

## 2018-10-10 ENCOUNTER — Other Ambulatory Visit: Payer: Self-pay

## 2018-10-10 ENCOUNTER — Encounter: Payer: Self-pay | Admitting: Adult Health

## 2018-10-10 VITALS — BP 122/84 | HR 83 | Temp 97.9°F | Wt 222.2 lb

## 2018-10-10 DIAGNOSIS — Z7901 Long term (current) use of anticoagulants: Secondary | ICD-10-CM

## 2018-10-10 DIAGNOSIS — E663 Overweight: Secondary | ICD-10-CM

## 2018-10-10 DIAGNOSIS — N183 Chronic kidney disease, stage 3 unspecified: Secondary | ICD-10-CM

## 2018-10-10 DIAGNOSIS — E785 Hyperlipidemia, unspecified: Secondary | ICD-10-CM | POA: Diagnosis not present

## 2018-10-10 DIAGNOSIS — Z79899 Other long term (current) drug therapy: Secondary | ICD-10-CM

## 2018-10-10 DIAGNOSIS — I10 Essential (primary) hypertension: Secondary | ICD-10-CM | POA: Diagnosis not present

## 2018-10-10 DIAGNOSIS — E1122 Type 2 diabetes mellitus with diabetic chronic kidney disease: Secondary | ICD-10-CM | POA: Diagnosis not present

## 2018-10-10 DIAGNOSIS — E1169 Type 2 diabetes mellitus with other specified complication: Secondary | ICD-10-CM

## 2018-10-10 DIAGNOSIS — E559 Vitamin D deficiency, unspecified: Secondary | ICD-10-CM

## 2018-10-10 NOTE — Patient Instructions (Addendum)
Goals    . Exercise 3x per week (30 min per time)    . HEMOGLOBIN A1C < 7.0    . Weight (lb) < 215 lb (97.5 kg)        Try senokot or similar over the counter medication for slow bowels    Constipation, Adult Constipation is when a person has fewer bowel movements in a week than normal, has difficulty having a bowel movement, or has stools that are dry, hard, or larger than normal. Constipation may be caused by an underlying condition. It may become worse with age if a person takes certain medicines and does not take in enough fluids. Follow these instructions at home: Eating and drinking   Eat foods that have a lot of fiber, such as fresh fruits and vegetables, whole grains, and beans.  Limit foods that are high in fat, low in fiber, or overly processed, such as french fries, hamburgers, cookies, candies, and soda.  Drink enough fluid to keep your urine clear or pale yellow. General instructions  Exercise regularly or as told by your health care provider.  Go to the restroom when you have the urge to go. Do not hold it in.  Take over-the-counter and prescription medicines only as told by your health care provider. These include any fiber supplements.  Practice pelvic floor retraining exercises, such as deep breathing while relaxing the lower abdomen and pelvic floor relaxation during bowel movements.  Watch your condition for any changes.  Keep all follow-up visits as told by your health care provider. This is important. Contact a health care provider if:  You have pain that gets worse.  You have a fever.  You do not have a bowel movement after 4 days.  You vomit.  You are not hungry.  You lose weight.  You are bleeding from the anus.  You have thin, pencil-like stools. Get help right away if:  You have a fever and your symptoms suddenly get worse.  You leak stool or have blood in your stool.  Your abdomen is bloated.  You have severe pain in your  abdomen.  You feel dizzy or you faint. This information is not intended to replace advice given to you by your health care provider. Make sure you discuss any questions you have with your health care provider. Document Released: 12/05/2003 Document Revised: 02/18/2017 Document Reviewed: 08/27/2015 Elsevier Patient Education  2020 [employer].

## 2018-10-11 LAB — COMPLETE METABOLIC PANEL WITH GFR
AG Ratio: 1.9 (calc) (ref 1.0–2.5)
ALT: 17 U/L (ref 9–46)
AST: 23 U/L (ref 10–35)
Albumin: 4.5 g/dL (ref 3.6–5.1)
Alkaline phosphatase (APISO): 42 U/L (ref 35–144)
BUN/Creatinine Ratio: 15 (calc) (ref 6–22)
BUN: 22 mg/dL (ref 7–25)
CO2: 28 mmol/L (ref 20–32)
Calcium: 9.8 mg/dL (ref 8.6–10.3)
Chloride: 106 mmol/L (ref 98–110)
Creat: 1.42 mg/dL — ABNORMAL HIGH (ref 0.70–1.18)
GFR, Est African American: 57 mL/min/{1.73_m2} — ABNORMAL LOW (ref >=60)
GFR, Est Non African American: 49 mL/min/{1.73_m2} — ABNORMAL LOW (ref >=60)
Globulin: 2.4 g/dL (calc) (ref 1.9–3.7)
Glucose, Bld: 112 mg/dL — ABNORMAL HIGH (ref 65–99)
Potassium: 4.3 mmol/L (ref 3.5–5.3)
Sodium: 140 mmol/L (ref 135–146)
Total Bilirubin: 0.7 mg/dL (ref 0.2–1.2)
Total Protein: 6.9 g/dL (ref 6.1–8.1)

## 2018-10-11 LAB — CBC WITH DIFFERENTIAL/PLATELET
Absolute Monocytes: 289 cells/uL (ref 200–950)
Basophils Absolute: 59 cells/uL (ref 0–200)
Basophils Relative: 1.5 %
Eosinophils Absolute: 70 cells/uL (ref 15–500)
Eosinophils Relative: 1.8 %
HCT: 46.1 % (ref 38.5–50.0)
Hemoglobin: 15.4 g/dL (ref 13.2–17.1)
Lymphs Abs: 1439 cells/uL (ref 850–3900)
MCH: 30.8 pg (ref 27.0–33.0)
MCHC: 33.4 g/dL (ref 32.0–36.0)
MCV: 92.2 fL (ref 80.0–100.0)
MPV: 11.9 fL (ref 7.5–12.5)
Monocytes Relative: 7.4 %
Neutro Abs: 2044 cells/uL (ref 1500–7800)
Neutrophils Relative %: 52.4 %
Platelets: 240 10*3/uL (ref 140–400)
RBC: 5 10*6/uL (ref 4.20–5.80)
RDW: 13.2 % (ref 11.0–15.0)
Total Lymphocyte: 36.9 %
WBC: 3.9 10*3/uL (ref 3.8–10.8)

## 2018-10-11 LAB — HEMOGLOBIN A1C
Hgb A1c MFr Bld: 6.4 % of total Hgb — ABNORMAL HIGH (ref <5.7)
Mean Plasma Glucose: 137 (calc)
eAG (mmol/L): 7.6 (calc)

## 2018-10-11 LAB — LIPID PANEL
Cholesterol: 162 mg/dL (ref <200)
HDL: 43 mg/dL (ref >=40)
LDL Cholesterol (Calc): 101 mg/dL (calc) — ABNORMAL HIGH
Non-HDL Cholesterol (Calc): 119 mg/dL (calc) (ref <130)
Total CHOL/HDL Ratio: 3.8 (calc) (ref <5.0)
Triglycerides: 86 mg/dL (ref <150)

## 2018-10-11 LAB — TSH: TSH: 1.11 mIU/L (ref 0.40–4.50)

## 2018-10-11 LAB — MAGNESIUM: Magnesium: 1.8 mg/dL (ref 1.5–2.5)

## 2018-10-17 ENCOUNTER — Telehealth: Payer: Self-pay | Admitting: Pharmacist

## 2018-10-17 NOTE — Telephone Encounter (Signed)

## 2018-10-18 ENCOUNTER — Other Ambulatory Visit: Payer: Self-pay

## 2018-10-18 ENCOUNTER — Ambulatory Visit (INDEPENDENT_AMBULATORY_CARE_PROVIDER_SITE_OTHER): Payer: 59 | Admitting: *Deleted

## 2018-10-18 DIAGNOSIS — I2699 Other pulmonary embolism without acute cor pulmonale: Secondary | ICD-10-CM | POA: Diagnosis not present

## 2018-10-18 DIAGNOSIS — Z5181 Encounter for therapeutic drug level monitoring: Secondary | ICD-10-CM

## 2018-10-18 DIAGNOSIS — Z7901 Long term (current) use of anticoagulants: Secondary | ICD-10-CM

## 2018-10-18 DIAGNOSIS — Z9889 Other specified postprocedural states: Secondary | ICD-10-CM

## 2018-10-18 DIAGNOSIS — Z86711 Personal history of pulmonary embolism: Secondary | ICD-10-CM

## 2018-10-18 LAB — POCT INR: INR: 3 (ref 2.0–3.0)

## 2018-10-18 NOTE — Patient Instructions (Addendum)
Description   Continue taking 1 tablet daily except 2 tablets on Mondays and Fridays. Recheck in 5 weeks - pt wants to change to Eliquis at next visit (already given activated $10 copay card to bring to pharmacy). Please call with any medication changes or if scheduled for any procedures [facility]# [phone]. Main # [phone].

## 2018-11-22 ENCOUNTER — Other Ambulatory Visit: Payer: Self-pay

## 2018-11-22 ENCOUNTER — Ambulatory Visit (INDEPENDENT_AMBULATORY_CARE_PROVIDER_SITE_OTHER): Payer: 59 | Admitting: Pharmacist

## 2018-11-22 DIAGNOSIS — Z7901 Long term (current) use of anticoagulants: Secondary | ICD-10-CM

## 2018-11-22 DIAGNOSIS — Z5181 Encounter for therapeutic drug level monitoring: Secondary | ICD-10-CM

## 2018-11-22 DIAGNOSIS — I2699 Other pulmonary embolism without acute cor pulmonale: Secondary | ICD-10-CM | POA: Diagnosis not present

## 2018-11-22 DIAGNOSIS — Z9889 Other specified postprocedural states: Secondary | ICD-10-CM

## 2018-11-22 DIAGNOSIS — Z86711 Personal history of pulmonary embolism: Secondary | ICD-10-CM

## 2018-11-22 LAB — POCT INR: INR: 2.9 (ref 2.0–3.0)

## 2018-11-22 MED ORDER — RIVAROXABAN 10 MG PO TABS: 10.0000 mg | ORAL_TABLET | Freq: Every day | ORAL | 5 refills | Status: DC

## 2018-11-22 MED FILL — XARELTO 10 MG TABLET: 10 | 30 days supply | Qty: 30 | Fill #0

## 2018-11-22 NOTE — Patient Instructions (Signed)
Description   Stop taking warfarin and start taking Xarelto 10mg  once a day - first dose tonight.

## 2018-12-04 ENCOUNTER — Telehealth: Payer: Self-pay | Admitting: *Deleted

## 2018-12-04 NOTE — Telephone Encounter (Signed)
Received a voicemail from the pt stating that he would like to switch back to Coumadin/Warfarin from the new medication and to call him regarding this. He states that he had been more tired and weak, headaches, sleepy, and floating since starting the new medication and the symptoms has been going on a week. He states he does not want to keep Xarelto going. Pt advised to overlap 3 days with Xarelto and Coumadin and on day 4 he will take Coumadin/Warfarin only. Pt states he does not need any Coumadin/Warfarin sent in at this time. Made pt an appt for 12/12/2018 at 1030am.

## 2018-12-06 ENCOUNTER — Other Ambulatory Visit: Payer: Self-pay | Admitting: Internal Medicine

## 2018-12-06 DIAGNOSIS — H18413 Arcus senilis, bilateral: Secondary | ICD-10-CM | POA: Diagnosis not present

## 2018-12-06 MED FILL — NEO/POLYMYXIN/DEXAMETH DROP: 3.5-10000-0 | 17 days supply | Qty: 5 | Fill #0

## 2018-12-06 MED FILL — FENOFIBRATE 134 MG CAPSULE: 134 | 90 days supply | Qty: 90 | Fill #0

## 2018-12-11 ENCOUNTER — Emergency Department (HOSPITAL_COMMUNITY)
Admission: EM | Admit: 2018-12-11 | Discharge: 2018-12-11 | Disposition: A | Discharge status: Home / Self Care | Payer: No Typology Code available for payment source | Attending: Emergency Medicine | Admitting: Emergency Medicine

## 2018-12-11 ENCOUNTER — Emergency Department (HOSPITAL_COMMUNITY): Payer: No Typology Code available for payment source

## 2018-12-11 ENCOUNTER — Encounter (HOSPITAL_COMMUNITY): Payer: Self-pay | Admitting: Emergency Medicine

## 2018-12-11 ENCOUNTER — Other Ambulatory Visit: Payer: Self-pay | Admitting: Cardiology

## 2018-12-11 ENCOUNTER — Other Ambulatory Visit: Payer: Self-pay

## 2018-12-11 DIAGNOSIS — I129 Hypertensive chronic kidney disease with stage 1 through stage 4 chronic kidney disease, or unspecified chronic kidney disease: Secondary | ICD-10-CM | POA: Diagnosis not present

## 2018-12-11 DIAGNOSIS — Y92410 Unspecified street and highway as the place of occurrence of the external cause: Secondary | ICD-10-CM | POA: Diagnosis not present

## 2018-12-11 DIAGNOSIS — R778 Other specified abnormalities of plasma proteins: Secondary | ICD-10-CM

## 2018-12-11 DIAGNOSIS — Z7901 Long term (current) use of anticoagulants: Secondary | ICD-10-CM | POA: Insufficient documentation

## 2018-12-11 DIAGNOSIS — N183 Chronic kidney disease, stage 3 (moderate): Secondary | ICD-10-CM | POA: Diagnosis not present

## 2018-12-11 DIAGNOSIS — R42 Dizziness and giddiness: Secondary | ICD-10-CM | POA: Insufficient documentation

## 2018-12-11 DIAGNOSIS — S199XXA Unspecified injury of neck, initial encounter: Secondary | ICD-10-CM | POA: Diagnosis not present

## 2018-12-11 DIAGNOSIS — Z87891 Personal history of nicotine dependence: Secondary | ICD-10-CM | POA: Insufficient documentation

## 2018-12-11 DIAGNOSIS — I493 Ventricular premature depolarization: Secondary | ICD-10-CM

## 2018-12-11 DIAGNOSIS — R52 Pain, unspecified: Secondary | ICD-10-CM | POA: Diagnosis not present

## 2018-12-11 DIAGNOSIS — I491 Atrial premature depolarization: Secondary | ICD-10-CM | POA: Diagnosis not present

## 2018-12-11 DIAGNOSIS — S0990XA Unspecified injury of head, initial encounter: Secondary | ICD-10-CM | POA: Diagnosis not present

## 2018-12-11 DIAGNOSIS — R001 Bradycardia, unspecified: Secondary | ICD-10-CM | POA: Diagnosis not present

## 2018-12-11 DIAGNOSIS — Z79899 Other long term (current) drug therapy: Secondary | ICD-10-CM | POA: Insufficient documentation

## 2018-12-11 DIAGNOSIS — R000 Tachycardia, unspecified: Secondary | ICD-10-CM | POA: Diagnosis not present

## 2018-12-11 DIAGNOSIS — E1122 Type 2 diabetes mellitus with diabetic chronic kidney disease: Secondary | ICD-10-CM | POA: Diagnosis not present

## 2018-12-11 DIAGNOSIS — Y999 Unspecified external cause status: Secondary | ICD-10-CM | POA: Diagnosis not present

## 2018-12-11 DIAGNOSIS — I2510 Atherosclerotic heart disease of native coronary artery without angina pectoris: Secondary | ICD-10-CM | POA: Diagnosis not present

## 2018-12-11 DIAGNOSIS — M542 Cervicalgia: Secondary | ICD-10-CM

## 2018-12-11 DIAGNOSIS — Y93I9 Activity, other involving external motion: Secondary | ICD-10-CM | POA: Insufficient documentation

## 2018-12-11 DIAGNOSIS — R5383 Other fatigue: Secondary | ICD-10-CM | POA: Diagnosis not present

## 2018-12-11 DIAGNOSIS — I499 Cardiac arrhythmia, unspecified: Secondary | ICD-10-CM | POA: Diagnosis not present

## 2018-12-11 DIAGNOSIS — R7989 Other specified abnormal findings of blood chemistry: Secondary | ICD-10-CM | POA: Insufficient documentation

## 2018-12-11 LAB — BRAIN NATRIURETIC PEPTIDE: B Natriuretic Peptide: 125.2 pg/mL — ABNORMAL HIGH (ref 0.0–100.0)

## 2018-12-11 LAB — CBC
HCT: 47.8 % (ref 39.0–52.0)
Hemoglobin: 16.1 g/dL (ref 13.0–17.0)
MCH: 31.3 pg (ref 26.0–34.0)
MCHC: 33.7 g/dL (ref 30.0–36.0)
MCV: 92.8 fL (ref 80.0–100.0)
Platelets: 239 10*3/uL (ref 150–400)
RBC: 5.15 MIL/uL (ref 4.22–5.81)
RDW: 13.1 % (ref 11.5–15.5)
WBC: 3.6 10*3/uL — ABNORMAL LOW (ref 4.0–10.5)
nRBC: 0 % (ref 0.0–0.2)

## 2018-12-11 LAB — BASIC METABOLIC PANEL
Anion gap: 10 (ref 5–15)
BUN: 21 mg/dL (ref 8–23)
CO2: 24 mmol/L (ref 22–32)
Calcium: 9.4 mg/dL (ref 8.9–10.3)
Chloride: 104 mmol/L (ref 98–111)
Creatinine, Ser: 1.7 mg/dL — ABNORMAL HIGH (ref 0.61–1.24)
GFR calc Af Amer: 46 mL/min — ABNORMAL LOW (ref >60)
GFR calc non Af Amer: 39 mL/min — ABNORMAL LOW (ref >60)
Glucose, Bld: 127 mg/dL — ABNORMAL HIGH (ref 70–99)
Potassium: 4.1 mmol/L (ref 3.5–5.1)
Sodium: 138 mmol/L (ref 135–145)

## 2018-12-11 LAB — MAGNESIUM: Magnesium: 1.9 mg/dL (ref 1.7–2.4)

## 2018-12-11 LAB — TROPONIN I (HIGH SENSITIVITY)
Troponin I (High Sensitivity): 231 ng/L (ref <18)
Troponin I (High Sensitivity): 223 ng/L (ref <18)

## 2018-12-11 LAB — PROTIME-INR
INR: 1.4 — ABNORMAL HIGH (ref 0.8–1.2)
Prothrombin Time: 17 seconds — ABNORMAL HIGH (ref 11.4–15.2)

## 2018-12-11 MED ORDER — SODIUM CHLORIDE 0.9% FLUSH: 3.0000 mL | Freq: Once | INTRAVENOUS | Status: DC

## 2018-12-11 NOTE — ED Notes (Signed)
Lab to add on Mg to previous sent down labs.
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
## 2018-12-11 NOTE — ED Provider Notes (Signed)
5:33 PM Patient signed out from [facility] at shift change.   Pending cardiology recommendations for bradycardia and elevated troponin.  Troponin is elevated but stable.  No concerning findings regarding bradycardia here.  Reviewed notes from [facility] NP and Dr. Debara Pickett.  Patient cleared for discharge to home.  Plan is to have him follow-up in clinic for continued cardiac monitoring, possible EP consult as outpatient. His INR is low today and he will follow-up with Coumadin clinic tomorrow.     Carlisle Cater, PA-C 12/11/18 1735    Veryl Speak, MD 12/11/18 2258

## 2018-12-11 NOTE — ED Notes (Signed)
Patient transported to CT 

## 2018-12-11 NOTE — Discharge Instructions (Signed)
Please follow-up with your Coumadin clinic tomorrow as your INR is a bit low.  You have been cleared to go home by your cardiology group.  Please follow-up with them and their recommendations.   Please return to the emergency department with any worsening symptoms or other concerns.

## 2018-12-12 ENCOUNTER — Ambulatory Visit (INDEPENDENT_AMBULATORY_CARE_PROVIDER_SITE_OTHER): Payer: 59 | Admitting: *Deleted

## 2018-12-12 DIAGNOSIS — Z7901 Long term (current) use of anticoagulants: Secondary | ICD-10-CM

## 2018-12-12 DIAGNOSIS — Z86711 Personal history of pulmonary embolism: Secondary | ICD-10-CM | POA: Diagnosis not present

## 2018-12-12 DIAGNOSIS — Z5181 Encounter for therapeutic drug level monitoring: Secondary | ICD-10-CM | POA: Diagnosis not present

## 2018-12-12 DIAGNOSIS — Z9889 Other specified postprocedural states: Secondary | ICD-10-CM

## 2018-12-12 DIAGNOSIS — I2699 Other pulmonary embolism without acute cor pulmonale: Secondary | ICD-10-CM

## 2018-12-12 LAB — POCT INR: INR: 1.5 — AB (ref 2.0–3.0)

## 2018-12-12 NOTE — Patient Instructions (Addendum)
Description   Today take 2 tablets and tomorrow take 1.5 tablets then continue taking 1 tablet daily except 2 tablets on Mondays and Fridays. Resume normal green intake now that you are resuming Warfarin. Recheck INR in 1 week (resumed Warfarin 12/04/2018) Coumadin Clinic [phone] Main [phone]     A full discussion of the nature of anticoagulants has been carried out.  A benefit risk analysis has been presented to the patient, so that they understand the justification for choosing anticoagulation at this time. The need for frequent and regular monitoring, precise dosage adjustment and compliance is stressed.  Side effects of potential bleeding are discussed.  The patient should avoid any OTC items containing aspirin or ibuprofen, and should avoid great swings in general diet.  Avoid alcohol consumption.  Call if any signs of abnormal bleeding.

## 2018-12-13 ENCOUNTER — Telehealth: Payer: Self-pay | Admitting: *Deleted

## 2018-12-13 NOTE — Telephone Encounter (Signed)
[employer] to ship a 14 day Cardiac event monitor to his home.  Instructions reviewed briefly as they are included in the monitor kit.

## 2018-12-26 ENCOUNTER — Telehealth: Payer: Self-pay | Admitting: Cardiovascular Disease

## 2018-12-26 NOTE — Telephone Encounter (Signed)
New message:    Patient calling stating he need some help with his heart monitor. Please call patient. [phone] if patient do not answer.

## 2019-01-10 ENCOUNTER — Encounter: Payer: Self-pay | Admitting: Internal Medicine

## 2019-01-15 ENCOUNTER — Encounter: Payer: Self-pay | Admitting: Physician Assistant

## 2019-01-15 MED FILL — EZETIMIBE 10 MG TABS: 10 | 90 days supply | Qty: 90 | Fill #0

## 2019-01-15 MED FILL — AMOXICILLIN 500 MG CAPSULE: 500 | 4 days supply | Qty: 16 | Fill #0
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
## 2019-01-17 ENCOUNTER — Encounter: Payer: Self-pay | Admitting: Physician Assistant

## 2019-01-17 ENCOUNTER — Other Ambulatory Visit: Payer: Self-pay

## 2019-01-17 ENCOUNTER — Ambulatory Visit (INDEPENDENT_AMBULATORY_CARE_PROVIDER_SITE_OTHER): Payer: 59 | Admitting: *Deleted

## 2019-01-17 ENCOUNTER — Ambulatory Visit (INDEPENDENT_AMBULATORY_CARE_PROVIDER_SITE_OTHER): Payer: 59 | Admitting: Physician Assistant

## 2019-01-17 VITALS — BP 122/76 | HR 80 | Ht 72.0 in | Wt 219.1 lb

## 2019-01-17 DIAGNOSIS — I428 Other cardiomyopathies: Secondary | ICD-10-CM | POA: Diagnosis not present

## 2019-01-17 DIAGNOSIS — I2699 Other pulmonary embolism without acute cor pulmonale: Secondary | ICD-10-CM

## 2019-01-17 DIAGNOSIS — I2510 Atherosclerotic heart disease of native coronary artery without angina pectoris: Secondary | ICD-10-CM

## 2019-01-17 DIAGNOSIS — D72819 Decreased white blood cell count, unspecified: Secondary | ICD-10-CM | POA: Diagnosis not present

## 2019-01-17 DIAGNOSIS — I493 Ventricular premature depolarization: Secondary | ICD-10-CM

## 2019-01-17 DIAGNOSIS — R001 Bradycardia, unspecified: Secondary | ICD-10-CM | POA: Diagnosis not present

## 2019-01-17 DIAGNOSIS — Z7901 Long term (current) use of anticoagulants: Secondary | ICD-10-CM | POA: Diagnosis not present

## 2019-01-17 DIAGNOSIS — N183 Chronic kidney disease, stage 3 unspecified: Secondary | ICD-10-CM | POA: Diagnosis not present

## 2019-01-17 DIAGNOSIS — Z9889 Other specified postprocedural states: Secondary | ICD-10-CM

## 2019-01-17 LAB — POCT INR: INR: 3 (ref 2.0–3.0)

## 2019-01-17 NOTE — Patient Instructions (Signed)
Description    Continue taking 1 tablet daily except 2 tablets on Mondays and Fridays. Resume normal green intake. Recheck INR in 4 weeks. [facility] [phone]

## 2019-01-17 NOTE — Patient Instructions (Addendum)
Medication Instructions:  Your physician recommends that you continue on your current medications as directed. Please refer to the Current Medication list given to you today.  *If you need a refill on your cardiac medications before your next appointment, please call your pharmacy*  Lab Work: TODAY:  BMET, CBC, TSH & MAGNESIUM  If you have labs (blood work) drawn today and your tests are completely normal, you will receive your results only by: Marland Kitchen MyChart Message (if you have MyChart) OR . A paper copy in the mail If you have any lab test that is abnormal or we need to change your treatment, we will call you to review the results.  Testing/Procedures: None ordered  Follow-Up: At [facility], you and your health needs are our priority.  As part of our continuing mission to provide you with exceptional heart care, we have created designated Provider Care Teams.  These Care Teams include your primary Cardiologist (physician) and Advanced Practice Providers (APPs -  Physician Assistants and Nurse Practitioners) who all work together to provide you with the care you need, when you need it.  Your next appointment:   6 MONTHS  The format for your next appointment:   In Person  Provider:   You may see Lauree Chandler, MD or one of the following Advanced Practice Providers on your designated Care Team:    Melina Copa, PA-C  Ermalinda Barrios, PA-C   Other Instructions  Endocarditis Information  You may be at risk for developing endocarditis since you have history of a repaired heart valve. Endocarditis is an infection of the lining of the heart or heart valves. Certain surgical and dental procedures may put you at risk,  such as teeth cleaning or other dental procedures or any surgery involving the respiratory, urinary, gastrointestinal tract, gallbladder or prostate. Notify your doctor or dentist before having any invasive procedures. You will need to take antibiotics before certain  procedures. To prevent endocarditis, maintain good oral health. Seek prompt medical attention for any mouth/gum, skin or urinary tract infections.  When we reviewed your cholesterol from 09/2018 by primary care, your LDL level was still above goal. Dr. Angelena Form had previously discussed referral to our cholesterol clinic to consider injection medicines for your cholesterol. Please give this some thought and let us know if you'd like to proceed.

## 2019-01-18 ENCOUNTER — Telehealth: Payer: Self-pay

## 2019-01-18 ENCOUNTER — Other Ambulatory Visit: Payer: Self-pay

## 2019-01-18 DIAGNOSIS — I493 Ventricular premature depolarization: Secondary | ICD-10-CM

## 2019-01-18 LAB — BASIC METABOLIC PANEL
BUN/Creatinine Ratio: 14 (ref 10–24)
BUN: 21 mg/dL (ref 8–27)
CO2: 24 mmol/L (ref 20–29)
Calcium: 10.1 mg/dL (ref 8.6–10.2)
Chloride: 104 mmol/L (ref 96–106)
Creatinine, Ser: 1.52 mg/dL — ABNORMAL HIGH (ref 0.76–1.27)
GFR calc Af Amer: 52 mL/min/{1.73_m2} — ABNORMAL LOW (ref >59)
GFR calc non Af Amer: 45 mL/min/{1.73_m2} — ABNORMAL LOW (ref >59)
Glucose: 85 mg/dL (ref 65–99)
Potassium: 4.8 mmol/L (ref 3.5–5.2)
Sodium: 141 mmol/L (ref 134–144)

## 2019-01-18 LAB — CBC
Hematocrit: 45.8 % (ref 37.5–51.0)
Hemoglobin: 15.4 g/dL (ref 13.0–17.7)
MCH: 30.7 pg (ref 26.6–33.0)
MCHC: 33.6 g/dL (ref 31.5–35.7)
MCV: 91 fL (ref 79–97)
Platelets: 256 10*3/uL (ref 150–450)
RBC: 5.01 x10E6/uL (ref 4.14–5.80)
RDW: 13.1 % (ref 11.6–15.4)
WBC: 4.4 10*3/uL (ref 3.4–10.8)

## 2019-01-18 LAB — MAGNESIUM: Magnesium: 2.1 mg/dL (ref 1.6–2.3)

## 2019-01-18 LAB — TSH: TSH: 1.57 u[IU]/mL (ref 0.450–4.500)

## 2019-01-18 NOTE — Telephone Encounter (Signed)
-----   Message from Charlie Pitter, [place] sent at 01/18/2019  1:37 PM EDT ----- Please let pt know labs are stable. Kidney function back near baseline. I reviewed his situation with Dr. Angelena Form who agrees with EP referral. I discussed with Dr. Angelena Form whether we need to think about ruling out heart blockages as a cause for his recurrent PVCs. Can we please arrange a Lexiscan nuclear stress test? Will need to please review the following info when setting up for informed consent:   "When you arrive in the lab, the technician will inject a small amount of radioactive tracer into your arm through an IV while you are resting quietly. This helps [place] to form pictures of your heart. You will likely only feel a sting from the IV. After a waiting period, resting pictures will be obtained under a big camera. These are the "before" pictures.  Next, you will be prepped for the stress portion of the test. You will receive a medicine through your IV that may cause temporary nausea, flushing, shortness of breath and sometimes chest discomfort or vomiting. This is typically short-lived and usually resolves quickly. If you experience symptoms, that does not automatically mean the test is abnormal. Some patients do not experience any symptoms at all. Your blood pressure and heart rate will be monitored, and we will be watching your EKG on a computer screen for any changes. It is very unlikely for someone to sustain a major complication such as a heart attack or rhythm problem during the test. During this portion of the test, the radiologist will inject another small amount of radioactive tracer into your IV. After a waiting period, you will undergo a second set of pictures. These are the "after" pictures.The doctor reading the test will compare the before-and-after images to look for evidence of heart blockages or heart weakness. The test usually takes 1 day to complete, but in certain instances (for example, if a patient is over a  certain weight limit), the test may be done over the span of 2 days."  Thanks! Dayna

## 2019-01-18 NOTE — Telephone Encounter (Signed)
Notes recorded by Frederik Schmidt, RN on 01/18/2019 at 3:32 PM EDT  The patient has been notified of the result and verbalized understanding. All questions (if any) were answered.  Frederik Schmidt, RN 01/18/2019 3:32 PM

## 2019-01-18 NOTE — Telephone Encounter (Signed)
Notes recorded by Frederik Schmidt, RN on 01/18/2019 at 2:09 PM EDT  [id number] 10/29  ------

## 2019-01-18 NOTE — Telephone Encounter (Signed)
Follow up  ° ° °Pt is returning call  ° ° °Please call back  °

## 2019-01-18 NOTE — Telephone Encounter (Signed)
-----   Message from Dayna N Dunn, PA-C sent at 01/18/2019  1:37 PM EDT ----- Please let pt know labs are stable. Kidney function back near baseline. I reviewed his situation with Dr. McAlhany who agrees with EP referral. I discussed with Dr. McAlhany whether we need to think about ruling out heart blockages as a cause for his recurrent PVCs. Can we please arrange a Lexiscan nuclear stress test? Will need to please review the following info when setting up for informed consent:   "When you arrive in the lab, the technician will inject a small amount of radioactive tracer into your arm through an IV while you are resting quietly. This helps us to form pictures of your heart. You will likely only feel a sting from the IV. After a waiting period, resting pictures will be obtained under a big camera. These are the "before" pictures.  Next, you will be prepped for the stress portion of the test. You will receive a medicine through your IV that may cause temporary nausea, flushing, shortness of breath and sometimes chest discomfort or vomiting. This is typically short-lived and usually resolves quickly. If you experience symptoms, that does not automatically mean the test is abnormal. Some patients do not experience any symptoms at all. Your blood pressure and heart rate will be monitored, and we will be watching your EKG on a computer screen for any changes. It is very unlikely for someone to sustain a major complication such as a heart attack or rhythm problem during the test. During this portion of the test, the radiologist will inject another small amount of radioactive tracer into your IV. After a waiting period, you will undergo a second set of pictures. These are the "after" pictures.The doctor reading the test will compare the before-and-after images to look for evidence of heart blockages or heart weakness. The test usually takes 1 day to complete, but in certain instances (for example, if a patient is over a  certain weight limit), the test may be done over the span of 2 days."  Thanks! Dayna  

## 2019-01-22 ENCOUNTER — Encounter: Payer: Self-pay | Admitting: Internal Medicine

## 2019-01-23 ENCOUNTER — Other Ambulatory Visit: Payer: Self-pay

## 2019-01-23 ENCOUNTER — Ambulatory Visit (INDEPENDENT_AMBULATORY_CARE_PROVIDER_SITE_OTHER): Payer: 59 | Admitting: Internal Medicine

## 2019-01-23 VITALS — BP 124/82 | HR 72 | Temp 97.0°F | Resp 16 | Ht 72.0 in | Wt 216.4 lb

## 2019-01-23 DIAGNOSIS — Z1211 Encounter for screening for malignant neoplasm of colon: Secondary | ICD-10-CM

## 2019-01-23 DIAGNOSIS — Z1212 Encounter for screening for malignant neoplasm of rectum: Secondary | ICD-10-CM

## 2019-01-23 DIAGNOSIS — E785 Hyperlipidemia, unspecified: Secondary | ICD-10-CM

## 2019-01-23 DIAGNOSIS — Z125 Encounter for screening for malignant neoplasm of prostate: Secondary | ICD-10-CM

## 2019-01-23 DIAGNOSIS — Z136 Encounter for screening for cardiovascular disorders: Secondary | ICD-10-CM

## 2019-01-23 DIAGNOSIS — R5383 Other fatigue: Secondary | ICD-10-CM

## 2019-01-23 DIAGNOSIS — E559 Vitamin D deficiency, unspecified: Secondary | ICD-10-CM

## 2019-01-23 DIAGNOSIS — I10 Essential (primary) hypertension: Secondary | ICD-10-CM | POA: Diagnosis not present

## 2019-01-23 DIAGNOSIS — Z87891 Personal history of nicotine dependence: Secondary | ICD-10-CM

## 2019-01-23 DIAGNOSIS — Z79899 Other long term (current) drug therapy: Secondary | ICD-10-CM

## 2019-01-23 DIAGNOSIS — E1169 Type 2 diabetes mellitus with other specified complication: Secondary | ICD-10-CM | POA: Diagnosis not present

## 2019-01-23 DIAGNOSIS — Z0000 Encounter for general adult medical examination without abnormal findings: Secondary | ICD-10-CM | POA: Diagnosis not present

## 2019-01-23 DIAGNOSIS — Z7901 Long term (current) use of anticoagulants: Secondary | ICD-10-CM

## 2019-01-23 DIAGNOSIS — Z0001 Encounter for general adult medical examination with abnormal findings: Secondary | ICD-10-CM

## 2019-01-23 DIAGNOSIS — N401 Enlarged prostate with lower urinary tract symptoms: Secondary | ICD-10-CM

## 2019-01-23 DIAGNOSIS — Z9889 Other specified postprocedural states: Secondary | ICD-10-CM | POA: Diagnosis not present

## 2019-01-23 DIAGNOSIS — E1121 Type 2 diabetes mellitus with diabetic nephropathy: Secondary | ICD-10-CM | POA: Diagnosis not present

## 2019-01-23 DIAGNOSIS — N1831 Chronic kidney disease, stage 3a: Secondary | ICD-10-CM | POA: Diagnosis not present

## 2019-01-23 DIAGNOSIS — Z8249 Family history of ischemic heart disease and other diseases of the circulatory system: Secondary | ICD-10-CM

## 2019-01-23 DIAGNOSIS — N138 Other obstructive and reflux uropathy: Secondary | ICD-10-CM

## 2019-01-23 DIAGNOSIS — E1122 Type 2 diabetes mellitus with diabetic chronic kidney disease: Secondary | ICD-10-CM

## 2019-01-23 NOTE — Progress Notes (Signed)
Annual  Screening/Preventative Visit  & Comprehensive Evaluation & Examination     This very nice 73 y.o. MBM  presents for a Screening /Preventative Visit & comprehensive evaluation and management of multiple medical co-morbidities.  Patient has been followed for HTN, HLD, T2_DM  and Vitamin D Deficiency.     HTN predates since 2005. Patient's BP has been controlled at home.  Today's BP is at goal - 124/82.  In 2009, he had a Negative Heart Cath for CAD. In 2010, patient had an  unprovoked PE and a 2sd PE in 2012 and has been on Coumadin since 2012.   In 2014, he had a  Mitral Valvuloplasty by Dr Ricard Dillon.  Patient denies any cardiac symptoms as chest pain, palpitations, shortness of breath, dizziness or ankle swelling.     Patient alleges intolerance to Statins & Welchol and his hyperlipidemia is near controlled with diet and Zetia. Patient denies myalgias or other medication SE's. Last lipids were near goal:  Lab Results  Component Value Date   CHOL 162 10/10/2018   HDL 43 10/10/2018   LDLCALC 101 (H) 10/10/2018   TRIG 86 10/10/2018   CHOLHDL 3.8 10/10/2018      Patient has hx/o T2_NIDDM (2002) w/CKD3  and patient denies reactive hypoglycemic symptoms, visual blurring, diabetic polys or paresthesias. Last A1c was not at goal:  Lab Results  Component Value Date   HGBA1C 6.4 (H) 10/10/2018       Finally, patient has history of Vitamin D Deficiency ("30"/2013) and last vitamin D was still slightly low (goal 70-100):  Lab Results  Component Value Date   VD25OH 53 07/11/2018   Current Outpatient Medications on File Prior to Visit  Medication Sig  . acetaminophen (TYLENOL) 500 MG tablet Take 1,000 mg by mouth every 6 (six) hours as needed for moderate pain or headache.   . Artificial Tear Solution (SOOTHE XP OP) Place 1 drop into both eyes daily as needed (dry eyes).  . benazepril (LOTENSIN) 20 MG tablet Take 10 mg by mouth daily.  . bisacodyl (DULCOLAX) 5 MG EC tablet Take 10 mg by  mouth daily as needed for moderate constipation.  . blood glucose meter kit and supplies KIT Dispense based on insurance preference.check blood sugar 1 time daily. [id number]  . Cholecalciferol (VITAMIN D3) 5000 units CAPS Take 5,000 Units by mouth at bedtime.  Marland Kitchen CINNAMON PO Take 2,000 mg by mouth every Monday, Wednesday, and Friday. At bedtime  . ezetimibe (ZETIA) 10 MG tablet Take 10 mg by mouth daily.  . fenofibrate micronized (LOFIBRA) 134 MG capsule Take 134 mg by mouth daily.  Marland Kitchen loratadine (CLARITIN) 10 MG tablet Take 10 mg by mouth daily as needed for allergies.  . mometasone (NASONEX) 50 MCG/ACT nasal spray Place 2 sprays into the nose daily as needed (allergies).   Marland Kitchen OVER THE COUNTER MEDICATION Apply 1 application topically daily as needed (joint pain). Glucosamine Chondroitin MSM topical cream  . tamsulosin (FLOMAX) 0.4 MG CAPS capsule Take 0.4 mg by mouth at bedtime as needed.  . warfarin (COUMADIN) 5 MG tablet Take 5-10 mg by mouth See admin instructions. Take 10 mg on Monday and Friday Take 5 mg all the other day   No current facility-administered medications on file prior to visit.    Allergies  Allergen Reactions  . Statins Other (See Comments)    muscle aches  . Welchol [Colesevelam Hcl] Other (See Comments)    Muscle weakness   Past Medical History:  Diagnosis  Date  . Allergy    SEASONAL  . Cataract    SMALL IN LEFT EYE  . CKD (chronic kidney disease), stage III   . Coronary artery disease    a. Cath 2014 - mild nonobstructive CAD - 40% prox LAD, 30% OM2, otherwise mild irregularities.  . Degenerative joint disease   . Frequent PVCs   . Headache   . Hyperlipidemia   . Hypertension   . Inguinal hernia    right  . NICM (nonischemic cardiomyopathy) ([place])   . Osteoarthritis   . Pre-diabetes   . Pulmonary embolism [facility])    march 2010, 2012              /   DVT x  2  LEFT 2010  . S/P mitral valve repair 08/15/2012   Complex valvuloplasty including triangular  resection of posterior leaflet, artificial Gore-tex neocord placement x4 and Sorin Memo 3D ring annuloplasty via right mini thoracotomy approach  . Seasonal allergies   . Severe mitral regurgitation by prior echocardiogram    a. s/p MV repair 2014.  . Sinus brady-tachy syndrome ([place])   . Vitamin D deficiency   . Wears glasses    Health Maintenance  Topic Date Due  . OPHTHALMOLOGY EXAM  10/11/2018  . INFLUENZA VACCINE  10/21/2018  . Hepatitis C Screening  04/13/2019 (Originally 20-Jun-1945)  . HEMOGLOBIN A1C  04/12/2019  . FOOT EXAM  01/23/2020  . COLONOSCOPY  07/11/2024  . TETANUS/TDAP  07/24/2027  . PNA vac Low Risk Adult  Completed   Immunization History  Administered Date(s) Administered  . Influenza, High Dose Seasonal PF 12/20/2013, 01/14/2015, 01/06/2016, 12/28/2016, 01/05/2018  . Influenza-Unspecified 01/20/2013  . Pneumococcal Conjugate-13 02/23/2016  . Pneumococcal-Unspecified 05/11/2012  . Td 11/22/2003  . Tdap 07/23/2017   Last Colon - 07/12/2014 - Dr Henrene Pastor - recc 10 yr f/u due May 2026  Past Surgical History:  Procedure Laterality Date  . achillis tendon     repair 20 years ago  . CARDIAC CATHETERIZATION    . COLONOSCOPY    . COLONOSCOPY W/ BIOPSIES AND POLYPECTOMY    . HIP ARTHROPLASTY     11/06/07  . INGUINAL HERNIA REPAIR Right 11/07/2015   Procedure: LAPAROSCOPIC RIGHT INGUINAL HERNIA WITH MESH;  Surgeon: Ralene Ok, MD;  Location: [place];  Service: General;  Laterality: Right;  . INSERTION OF MESH Right 11/07/2015   Procedure: INSERTION OF MESH;  Surgeon: Ralene Ok, MD;  Location: [place];  Service: General;  Laterality: Right;  . INTRAOPERATIVE TRANSESOPHAGEAL ECHOCARDIOGRAM N/A 08/15/2012   Procedure: INTRAOPERATIVE TRANSESOPHAGEAL ECHOCARDIOGRAM;  Surgeon: Rexene Alberts, MD;  Location: [place];  Service: Open Heart Surgery;  Laterality: N/A;  . MITRAL VALVE REPAIR Right 08/15/2012   Procedure: MINIMALLY INVASIVE MITRAL VALVE REPAIR (MVR);  Surgeon:  Rexene Alberts, MD;  Location: [place];  Service: Open Heart Surgery;  Laterality: Right;  . SHOULDER ARTHROSCOPY WITH ROTATOR CUFF REPAIR AND SUBACROMIAL DECOMPRESSION Left 01/27/2018   Procedure: LEFT SHOULDER ARTHROSCOPY, SUBACROMIAL DECOMPRESSION, OPEN ROTATOR CUFF REPAIR, OPEN DISTAL CLAVICLE RESECTION, OPEN BICEPS TENODESIS;  Surgeon: Netta Cedars, MD;  Location: [place];  Service: Orthopedics;  Laterality: Left;  . TEE WITHOUT CARDIOVERSION N/A 07/04/2012   Procedure: TRANSESOPHAGEAL ECHOCARDIOGRAM (TEE);  Surgeon: Larey Dresser, MD;  Location: [place];  Service: Cardiovascular;  Laterality: N/A;  . TOTAL HIP ARTHROPLASTY  08/24/2011   Procedure: TOTAL HIP ARTHROPLASTY ANTERIOR APPROACH;  Surgeon: Mauri Pole, MD;  Location: [facility];  Service: Orthopedics;  Laterality: Right;   Family History  Problem Relation Age of Onset  . Heart attack Father   . Hypertension Father   . Coronary artery disease Other   . Heart attack Brother 47  . Hypertension Sister   . Hypertension Brother   . Hyperlipidemia Sister   . Diabetes Sister   . Diabetes Brother    Social History   Socioeconomic History  . Marital status: Married    Spouse name: NPamela  . Number of children: 3  Occupational History  . Occupation: Advertising account planner: [employer]  Tobacco Use  . Smoking status: Former Smoker    Types: Cigars    Quit date: 03/22/1973    Years since quitting: 45.8  . Smokeless tobacco: Current User    Types: Chew  . Tobacco comment: uses smokeless tobacco  Substance and Sexual Activity  . Alcohol use: No    Alcohol/week: 0.0 standard drinks  . Drug use: No  . Sexual activity: Not Currently    ROS Constitutional: Denies fever, chills, weight loss/gain, headaches, insomnia,  night sweats or change in appetite. Does c/o fatigue. Eyes: Denies redness, blurred vision, diplopia, discharge, itchy or watery eyes.  ENT: Denies discharge, congestion, post nasal drip,  epistaxis, sore throat, earache, hearing loss, dental pain, Tinnitus, Vertigo, Sinus pain or snoring.  Cardio: Denies chest pain, palpitations, irregular heartbeat, syncope, dyspnea, diaphoresis, orthopnea, PND, claudication or edema Respiratory: denies cough, dyspnea, DOE, pleurisy, hoarseness, laryngitis or wheezing.  Gastrointestinal: Denies dysphagia, heartburn, reflux, water brash, pain, cramps, nausea, vomiting, bloating, diarrhea, constipation, hematemesis, melena, hematochezia, jaundice or hemorrhoids Genitourinary: Denies dysuria, frequency, discharge, hematuria or flank pain. Has urgency, nocturia x 2-3 & occasional hesitancy. Musculoskeletal: Denies arthralgia, myalgia, stiffness, Jt. Swelling, pain, limp or strain/sprain. Denies Falls. Skin: Denies puritis, rash, hives, warts, acne, eczema or change in skin lesion Neuro: No weakness, tremor, incoordination, spasms, paresthesia or pain Psychiatric: Denies confusion, memory loss or sensory loss. Denies Depression. Endocrine: Denies change in weight, skin, hair change, nocturia, and paresthesia, diabetic polys, visual blurring or hyper / hypo glycemic episodes.  Heme/Lymph: No excessive bleeding, bruising or enlarged lymph nodes.  Physical Exam  BP 124/82   Pulse 72   Temp (!) 97 F (36.1 C)   Resp 16   Ht 6' (1.829 m)   Wt 216 lb 6.4 oz (98.2 kg)   BMI 29.35 kg/m   General Appearance: Well nourished and well groomed and in no apparent distress.  Eyes: PERRLA, EOMs, conjunctiva no swelling or erythema, normal fundi and vessels. Sinuses: No frontal/maxillary tenderness ENT/Mouth: EACs patent / TMs  nl. Nares clear without erythema, swelling, mucoid exudates. Oral hygiene is good. No erythema, swelling, or exudate. Tongue normal, non-obstructing. Tonsils not swollen or erythematous. Hearing normal.  Neck: Supple, thyroid not palpable. No bruits, nodes or JVD. Respiratory: Respiratory effort normal.  BS equal and clear bilateral  without rales, rhonci, wheezing or stridor. Cardio: Heart sounds are normal with regular rate and rhythm and no murmurs, rubs or gallops. Peripheral pulses are normal and equal bilaterally without edema. No aortic or femoral bruits. Chest: symmetric with normal excursions and percussion.  Abdomen: Soft, with Nl bowel sounds. Nontender, no guarding, rebound, hernias, masses, or organomegaly.  Lymphatics: Non tender without lymphadenopathy.  Musculoskeletal: Full ROM all peripheral extremities, joint stability, 5/5 strength, and normal gait. Skin: Warm and dry without rashes, lesions, cyanosis, clubbing or  ecchymosis.  Neuro: Cranial nerves intact, reflexes equal bilaterally. Normal muscle tone, no  cerebellar symptoms. Sensation intact to touch, vibratory and Monofilament to the toes bilaterally. Pysch: Alert and oriented X 3 with normal affect, insight and judgment appropriate.   Assessment and Plan  1. Annual Preventative/Screening Exam   2. Essential hypertension  - [place], RETROPERITNL ABD,  LTD - Urinalysis, Routine w reflex microscopic - Microalbumin / creatinine urine ratio - CBC with Differential/Platelet - COMPLETE METABOLIC PANEL WITH GFR - Magnesium - TSH  3. Hyperlipidemia associated with type 2 diabetes mellitus (HCC)  - [place], RETROPERITNL ABD,  LTD - Lipid panel - TSH  4. Type 2 diabetes mellitus with stage 3a chronic kidney disease, without long-term current use of insulin (HCC)  - [place], RETROPERITNL ABD,  LTD - Urinalysis, Routine w reflex microscopic - Microalbumin / creatinine urine ratio - HM DIABETES FOOT EXAM - LOW EXTREMITY NEUR EXAM DOCUM - Hemoglobin A1c - Insulin, random  5. S/P mitral valve repair  - VITAMIN D 25 Hydroxy (Vit-D Deficiency, Fractures)  6. Vitamin D deficiency  - VITAMIN D 25 Hydroxyl  7. BPH with obstruction/lower urinary tract symptoms  - Urinalysis, Routine w reflex microscopic - PSA  8. Prostate cancer screening  - PSA  9.  Screening for colorectal cancer  - POC Hemoccult Bld/Stl   10. FHx: heart disease  11. Former smoker  28. Screening for AAA (aortic abdominal aneurysm)  - [place], RETROPERITNL ABD,  LTD  13. Fatigue, unspecified type  - Iron,Total/Total Iron Binding Cap - Vitamin B12 - CBC with Differential/Platelet - TSH  14. Long term current use of anticoagulant therapy  - [facility] at [facility]mad[facility]  15. Medication management  - Urinalysis, Routine w reflex microscopic - Microalbumin / creatinine urine ratio - CBC with Differential/Platelet - COMPLETE METABOLIC PANEL WITH GFR - Magnesium - Lipid panel - TSH - Hemoglobin A1c - Insulin, random - VITAMIN D 25 Hydroxyl  16. CKD stage 3 due to type 2 diabetes mellitus ([place])   17. Screening for ischemic heart disease           Patient was counseled in prudent diet, weight control to achieve/maintain BMI less than 25, BP monitoring, regular exercise and medications as discussed.  Discussed med effects and SE's. Routine screening labs and tests as requested with regular follow-up as recommended. Over 40 minutes of exam, counseling, chart review and high complex critical decision making was performed   Kirtland Bouchard, MD

## 2019-01-23 NOTE — Patient Instructions (Signed)

## 2019-01-24 LAB — COMPLETE METABOLIC PANEL WITH GFR
AG Ratio: 2.1 (calc) (ref 1.0–2.5)
ALT: 15 U/L (ref 9–46)
AST: 23 U/L (ref 10–35)
Albumin: 4.8 g/dL (ref 3.6–5.1)
Alkaline phosphatase (APISO): 43 U/L (ref 35–144)
BUN/Creatinine Ratio: 15 (calc) (ref 6–22)
BUN: 24 mg/dL (ref 7–25)
CO2: 26 mmol/L (ref 20–32)
Calcium: 10.2 mg/dL (ref 8.6–10.3)
Chloride: 106 mmol/L (ref 98–110)
Creat: 1.6 mg/dL — ABNORMAL HIGH (ref 0.70–1.18)
GFR, Est African American: 49 mL/min/{1.73_m2} — ABNORMAL LOW (ref >=60)
GFR, Est Non African American: 42 mL/min/{1.73_m2} — ABNORMAL LOW (ref >=60)
Globulin: 2.3 g/dL (calc) (ref 1.9–3.7)
Glucose, Bld: 99 mg/dL (ref 65–99)
Potassium: 4.2 mmol/L (ref 3.5–5.3)
Sodium: 139 mmol/L (ref 135–146)
Total Bilirubin: 0.5 mg/dL (ref 0.2–1.2)
Total Protein: 7.1 g/dL (ref 6.1–8.1)

## 2019-01-24 LAB — IRON, TOTAL/TOTAL IRON BINDING CAP
%SAT: 36 % (calc) (ref 20–48)
Iron: 117 ug/dL (ref 50–180)
TIBC: 324 mcg/dL (calc) (ref 250–425)

## 2019-01-24 LAB — HEMOGLOBIN A1C
Hgb A1c MFr Bld: 6.3 % of total Hgb — ABNORMAL HIGH (ref <5.7)
Mean Plasma Glucose: 134 (calc)
eAG (mmol/L): 7.4 (calc)

## 2019-01-24 LAB — MAGNESIUM: Magnesium: 1.9 mg/dL (ref 1.5–2.5)

## 2019-01-24 LAB — TSH: TSH: 2.01 mIU/L (ref 0.40–4.50)

## 2019-01-24 LAB — MICROALBUMIN / CREATININE URINE RATIO
Creatinine, Urine: 128 mg/dL (ref 20–320)
Microalb Creat Ratio: 54 mcg/mg creat — ABNORMAL HIGH (ref <30)
Microalb, Ur: 6.9 mg/dL

## 2019-01-24 LAB — URINALYSIS, ROUTINE W REFLEX MICROSCOPIC
Bilirubin Urine: NEGATIVE
Glucose, UA: NEGATIVE
Hgb urine dipstick: NEGATIVE
Ketones, ur: NEGATIVE
Leukocytes,Ua: NEGATIVE
Nitrite: NEGATIVE
Protein, ur: NEGATIVE
Specific Gravity, Urine: 1.018 (ref 1.001–1.03)
pH: 5.5 (ref 5.0–8.0)

## 2019-01-24 LAB — CBC WITH DIFFERENTIAL/PLATELET
Absolute Monocytes: 382 cells/uL (ref 200–950)
Basophils Absolute: 71 cells/uL (ref 0–200)
Basophils Relative: 1.7 %
Eosinophils Absolute: 122 cells/uL (ref 15–500)
Eosinophils Relative: 2.9 %
HCT: 46.8 % (ref 38.5–50.0)
Hemoglobin: 15.8 g/dL (ref 13.2–17.1)
Lymphs Abs: 1785 cells/uL (ref 850–3900)
MCH: 31.1 pg (ref 27.0–33.0)
MCHC: 33.8 g/dL (ref 32.0–36.0)
MCV: 92.1 fL (ref 80.0–100.0)
MPV: 12.3 fL (ref 7.5–12.5)
Monocytes Relative: 9.1 %
Neutro Abs: 1840 cells/uL (ref 1500–7800)
Neutrophils Relative %: 43.8 %
Platelets: 252 10*3/uL (ref 140–400)
RBC: 5.08 10*6/uL (ref 4.20–5.80)
RDW: 13.2 % (ref 11.0–15.0)
Total Lymphocyte: 42.5 %
WBC: 4.2 10*3/uL (ref 3.8–10.8)

## 2019-01-24 LAB — VITAMIN B12: Vitamin B-12: 611 pg/mL (ref 200–1100)

## 2019-01-24 LAB — LIPID PANEL
Cholesterol: 178 mg/dL (ref <200)
HDL: 45 mg/dL (ref >=40)
LDL Cholesterol (Calc): 108 mg/dL (calc) — ABNORMAL HIGH
Non-HDL Cholesterol (Calc): 133 mg/dL (calc) — ABNORMAL HIGH (ref <130)
Total CHOL/HDL Ratio: 4 (calc) (ref <5.0)
Triglycerides: 142 mg/dL (ref <150)

## 2019-01-24 LAB — PSA: PSA: 0.4 ng/mL (ref <=4.0)

## 2019-01-24 LAB — INSULIN, RANDOM: Insulin: 19.5 u[IU]/mL

## 2019-01-24 LAB — VITAMIN D 25 HYDROXY (VIT D DEFICIENCY, FRACTURES): Vit D, 25-Hydroxy: 41 ng/mL (ref 30–100)

## 2019-01-25 ENCOUNTER — Telehealth (HOSPITAL_COMMUNITY): Payer: Self-pay

## 2019-01-25 NOTE — Telephone Encounter (Signed)
Spoke with the patient, instructions given. Asked to call back with any questions. S.Marinna Blane EMTP 

## 2019-01-26 ENCOUNTER — Ambulatory Visit (HOSPITAL_COMMUNITY): Payer: 59 | Attending: Cardiovascular Disease

## 2019-01-26 ENCOUNTER — Other Ambulatory Visit: Payer: Self-pay

## 2019-01-26 DIAGNOSIS — I493 Ventricular premature depolarization: Secondary | ICD-10-CM | POA: Diagnosis present

## 2019-01-26 LAB — MYOCARDIAL PERFUSION IMAGING
LV dias vol: 189 mL (ref 62–150)
LV sys vol: 137 mL
Peak HR: 97 {beats}/min
Rest HR: 80 {beats}/min
SDS: 3
SRS: 3
SSS: 6
TID: 1.05

## 2019-01-26 MED ORDER — TECHNETIUM TC 99M TETROFOSMIN IV KIT
32.9000 | PACK | Freq: Once | INTRAVENOUS | Status: AC | PRN
  Administered 2019-01-26: 32.9 via INTRAVENOUS
  Filled 2019-01-26: qty 33

## 2019-01-26 MED ORDER — TECHNETIUM TC 99M TETROFOSMIN IV KIT
10.4000 | PACK | Freq: Once | INTRAVENOUS | Status: AC | PRN
  Administered 2019-01-26: 10.4 via INTRAVENOUS
  Filled 2019-01-26: qty 11

## 2019-01-26 MED ORDER — REGADENOSON 0.4 MG/5ML IV SOLN
0.4000 mg | Freq: Once | INTRAVENOUS | Status: AC
  Administered 2019-01-26: 0.4 mg via INTRAVENOUS

## 2019-01-27 ENCOUNTER — Encounter: Payer: Self-pay | Admitting: Internal Medicine

## 2019-01-27 DIAGNOSIS — Z23 Encounter for immunization: Secondary | ICD-10-CM | POA: Diagnosis not present

## 2019-01-29 ENCOUNTER — Telehealth: Payer: Self-pay | Admitting: *Deleted

## 2019-01-29 DIAGNOSIS — I428 Other cardiomyopathies: Secondary | ICD-10-CM

## 2019-01-29 NOTE — Telephone Encounter (Signed)
-----   Message from Charlie Pitter, [place] sent at 01/27/2019  7:45 AM EST ----- Please let pt know findings do not show any active ischemia/blockage but heart function is lower than before. Would get a f/u 2D echocardiogram to confirm LV function and plan f/u with EP as planned. Will also cc to Dr. Angelena Form as well. Dayna Dunn PA-C

## 2019-02-01 ENCOUNTER — Other Ambulatory Visit: Payer: Self-pay

## 2019-02-01 ENCOUNTER — Ambulatory Visit (HOSPITAL_COMMUNITY): Payer: 59 | Attending: Cardiology

## 2019-02-01 DIAGNOSIS — I428 Other cardiomyopathies: Secondary | ICD-10-CM | POA: Diagnosis not present

## 2019-02-09 ENCOUNTER — Encounter: Payer: Self-pay | Admitting: Cardiology

## 2019-02-09 ENCOUNTER — Other Ambulatory Visit: Payer: Self-pay

## 2019-02-09 ENCOUNTER — Ambulatory Visit (INDEPENDENT_AMBULATORY_CARE_PROVIDER_SITE_OTHER): Payer: 59 | Admitting: Cardiology

## 2019-02-09 VITALS — BP 132/86 | HR 76 | Ht 72.0 in | Wt 219.8 lb

## 2019-02-09 DIAGNOSIS — I493 Ventricular premature depolarization: Secondary | ICD-10-CM

## 2019-02-09 MED ORDER — MEXILETINE HCL 200 MG PO CAPS: 200.0000 mg | ORAL_CAPSULE | Freq: Two times a day (BID) | ORAL | 3 refills | Status: DC

## 2019-02-09 MED FILL — MEXILETINE 200 MG CAPSULE: 200 | 30 days supply | Qty: 60 | Fill #0

## 2019-02-09 NOTE — Progress Notes (Signed)
Electrophysiology Office Note   Date:  02/09/2019   ID:  Marcus, Griffin January 17, 1946, MRN [id number]  PCP:  Unk Pinto, MD  Cardiologist:  Angelena Form Primary Electrophysiologist:  Patrece Tallie Meredith Leeds, MD    Chief Complaint: PVC, NICM   History of Present Illness: [name] is a 73 y.o. male who is being seen today for the evaluation of PVC at the request of Marcus Pitter, PA-C. Presenting today for electrophysiology evaluation.  He has a history of hypertension, recurrent pulmonary emboli, mild nonobstructive coronary disease, nonischemic cardiomyopathy with an ejection fraction of 35 to 40%, mitral valve repair in 2014, OSA, prediabetes, CKD stage III, and frequent PVCs.  He had a left heart catheterization in 2014 that showed severe mitral regurgitation and a preserved ejection fraction with mild nonobstructive coronary disease.  He underwent minimally invasive mitral valve repair.  Were cardiac monitor that showed sinus rhythm with PVCs.  Echo in 2016 showed an ejection fraction of 35 to 40% with diffuse hypokinesis.  He wore a Holter monitor 09/08/2017 that showed frequent PVCs at 22%.  He was started on carvedilol but did not tolerate this due to fatigue.  Today, he denies symptoms of palpitations, chest pain, shortness of breath, orthopnea, PND, lower extremity edema, claudication, dizziness, presyncope, syncope, bleeding, or neurologic sequela. The patient is tolerating medications without difficulties.  He occasionally feels palpitations, but otherwise is feeling well.  He has no chest pain or shortness of breath.  He has mild amounts of fatigue.   Past Medical History:  Diagnosis Date  . Allergy    SEASONAL  . Cataract    SMALL IN LEFT EYE  . CKD (chronic kidney disease), stage III   . Coronary artery disease    a. Cath 2014 - mild nonobstructive CAD - 40% prox LAD, 30% OM2, otherwise mild irregularities.  . Degenerative joint disease   . Frequent PVCs   .  Headache   . Hyperlipidemia   . Hypertension   . Inguinal hernia    right  . NICM (nonischemic cardiomyopathy) ([place])   . Osteoarthritis   . Pre-diabetes   . Pulmonary embolism [facility])    march 2010, 2012              /   DVT x  2  LEFT 2010  . S/P mitral valve repair 08/15/2012   Complex valvuloplasty including triangular resection of posterior leaflet, artificial Gore-tex neocord placement x4 and Sorin Memo 3D ring annuloplasty via right mini thoracotomy approach  . Seasonal allergies   . Severe mitral regurgitation by prior echocardiogram    a. s/p MV repair 2014.  . Sinus brady-tachy syndrome ([place])   . Vitamin D deficiency   . Wears glasses    Past Surgical History:  Procedure Laterality Date  . achillis tendon     repair 20 years ago  . CARDIAC CATHETERIZATION    . COLONOSCOPY    . COLONOSCOPY W/ BIOPSIES AND POLYPECTOMY    . HIP ARTHROPLASTY     11/06/07  . INGUINAL HERNIA REPAIR Right 11/07/2015   Procedure: LAPAROSCOPIC RIGHT INGUINAL HERNIA WITH MESH;  Surgeon: Ralene Ok, MD;  Location: [place];  Service: General;  Laterality: Right;  . INSERTION OF MESH Right 11/07/2015   Procedure: INSERTION OF MESH;  Surgeon: Ralene Ok, MD;  Location: [place];  Service: General;  Laterality: Right;  . INTRAOPERATIVE TRANSESOPHAGEAL ECHOCARDIOGRAM N/A 08/15/2012   Procedure: INTRAOPERATIVE TRANSESOPHAGEAL ECHOCARDIOGRAM;  Surgeon: Rexene Alberts, MD;  Location[facility];  Service: Open Heart Surgery;  Laterality: N/A;  . MITRAL VALVE REPAIR Right 08/15/2012   Procedure: MINIMALLY INVASIVE MITRAL VALVE REPAIR (MVR);  Surgeon: Rexene Alberts, MD;  Location: [place];  Service: Open Heart Surgery;  Laterality: Right;  . SHOULDER ARTHROSCOPY WITH ROTATOR CUFF REPAIR AND SUBACROMIAL DECOMPRESSION Left 01/27/2018   Procedure: LEFT SHOULDER ARTHROSCOPY, SUBACROMIAL DECOMPRESSION, OPEN ROTATOR CUFF REPAIR, OPEN DISTAL CLAVICLE RESECTION, OPEN BICEPS TENODESIS;  Surgeon: Netta Cedars, MD;  Location:  [place];  Service: Orthopedics;  Laterality: Left;  . TEE WITHOUT CARDIOVERSION N/A 07/04/2012   Procedure: TRANSESOPHAGEAL ECHOCARDIOGRAM (TEE);  Surgeon: Larey Dresser, MD;  Location: [place];  Service: Cardiovascular;  Laterality: N/A;  . TOTAL HIP ARTHROPLASTY  08/24/2011   Procedure: TOTAL HIP ARTHROPLASTY ANTERIOR APPROACH;  Surgeon: Mauri Pole, MD;  Location: [facility];  Service: Orthopedics;  Laterality: Right;     Current Outpatient Medications  Medication Sig Dispense Refill  . acetaminophen (TYLENOL) 500 MG tablet Take 1,000 mg by mouth every 6 (six) hours as needed for moderate pain or headache.     . Artificial Tear Solution (SOOTHE XP OP) Place 1 drop into both eyes daily as needed (dry eyes).    . benazepril (LOTENSIN) 20 MG tablet Take 10 mg by mouth daily.    . bisacodyl (DULCOLAX) 5 MG EC tablet Take 10 mg by mouth daily as needed for moderate constipation.    . blood glucose meter kit and supplies KIT Dispense based on insurance preference.check blood sugar 1 time daily. [id number] 1 each 0  . Cholecalciferol (VITAMIN D3) 5000 units CAPS Take 5,000 Units by mouth at bedtime.    Marland Kitchen CINNAMON PO Take 2,000 mg by mouth every Monday, Wednesday, and Friday. At bedtime    . ezetimibe (ZETIA) 10 MG tablet Take 10 mg by mouth daily.    . fenofibrate micronized (LOFIBRA) 134 MG capsule Take 134 mg by mouth daily.    Marland Kitchen loratadine (CLARITIN) 10 MG tablet Take 10 mg by mouth daily as needed for allergies.    . mometasone (NASONEX) 50 MCG/ACT nasal spray Place 2 sprays into the nose daily as needed (allergies).     Marland Kitchen OVER THE COUNTER MEDICATION Apply 1 application topically daily as needed (joint pain). Glucosamine Chondroitin MSM topical cream    . tamsulosin (FLOMAX) 0.4 MG CAPS capsule Take 0.4 mg by mouth at bedtime as needed.    . warfarin (COUMADIN) 5 MG tablet Take 5-10 mg by mouth See admin instructions. Take 10 mg on Monday and Friday Take 5 mg all the other day    .  mexiletine (MEXITIL) 200 MG capsule Take 1 capsule (200 mg total) by mouth 2 (two) times daily. 60 capsule 3   No current facility-administered medications for this visit.     Allergies:   Statins and Welchol [colesevelam hcl]   Social History:  The patient  reports that he quit smoking about 45 years ago. His smoking use included cigars. His smokeless tobacco use includes chew. He reports that he does not drink alcohol or use drugs.   Family History:  The patient's family history includes Coronary artery disease in an other family member; Diabetes in his brother and sister; Heart attack in his father; Heart attack (age of onset: 32) in his brother; Hyperlipidemia in his sister; Hypertension in his brother, father, and sister.    ROS:  Please see the history of present illness.   Otherwise, review of systems is  positive for none.   All other systems are reviewed and negative.    PHYSICAL EXAM: VS:  BP 132/86   Pulse 76   Ht 6' (1.829 m)   Wt 219 lb 12.8 oz (99.7 kg)   SpO2 97%   BMI 29.81 kg/m  , BMI Body mass index is 29.81 kg/m. GEN: Well nourished, well developed, in no acute distress  HEENT: normal  Neck: no JVD, carotid bruits, or masses Cardiac: RRR; no murmurs, rubs, or gallops,no edema  Respiratory:  clear to auscultation bilaterally, normal work of breathing GI: soft, nontender, nondistended, + BS MS: no deformity or atrophy  Skin: warm and dry Neuro:  Strength and sensation are intact Psych: euthymic mood, full affect  EKG:  EKG is ordered today. Personal review of the ekg ordered shows sinus rhythm  Recent Labs: 12/11/2018: B Natriuretic Peptide 125.2 01/23/2019: ALT 15; BUN 24; Creat 1.60; Hemoglobin 15.8; Magnesium 1.9; Platelets 252; Potassium 4.2; Sodium 139; TSH 2.01    Lipid Panel     Component Value Date/Time   CHOL 178 01/23/2019 1608   TRIG 142 01/23/2019 1608   HDL 45 01/23/2019 1608   CHOLHDL 4.0 01/23/2019 1608   VLDL 14 10/19/2016 1030    LDLCALC 108 (H) 01/23/2019 1608     Wt Readings from Last 3 Encounters:  02/09/19 219 lb 12.8 oz (99.7 kg)  01/26/19 219 lb (99.3 kg)  01/23/19 216 lb 6.4 oz (98.2 kg)      Other studies Reviewed: Additional studies/ records that were reviewed today include: TTE 02/01/19  Review of the above records today demonstrates:   1. Left ventricular ejection fraction, by visual estimation, is 35 to 40%. The left ventricle has moderately decreased function. There is no left ventricular hypertrophy.  2. Mildly dilated left ventricular internal cavity size.  3. Left ventricular diastolic function could not be evaluated.  4. Elevated left ventricular end-diastolic pressure.  5. Global right ventricle has normal systolic function.The right ventricular size is normal. No increase in right ventricular wall thickness.  6. Left atrial size was normal.  7. Right atrial size was normal.  8. The mitral valve has been repaired/replaced. There is moderate thickening of the mitral valve leaflet(s). Mild mitral valve stenosis by observation. MV peak gradient, 12.2 mmHg and mean gradient 10mHg. Trace mitral valve regurgitation.  9. The tricuspid valve is normal in structure. Tricuspid valve regurgitation is not demonstrated. 10. The aortic valve is normal in structure. Aortic valve regurgitation is trivial. Moderate aortic valve sclerosis/calcification without any evidence of aortic stenosis. 11. The pulmonic valve was normal in structure. Pulmonic valve regurgitation is not visualized. 12. There is mild dilatation of the aortic root and of the ascending aorta measuring 41 mm and 3581mrespectfully. 13. The inferior vena cava is normal in size with greater than 50% respiratory variability, suggesting right atrial pressure of 3 mmHg. 14. MV peak gradient, 12.2 mmHg and mean MV gradient 81m23m with MVA 1.49cm2. 15. The interatrial septum appears to be lipomatous. 16. Compared to prior echo, the mean MV gradient has  increased to 81mm7m  Myoview 01/26/19  Nuclear stress EF: 28%.  There was no ST segment deviation noted during stress.  Findings consistent with prior myocardial infarction.  This is a high risk study.  The left ventricular ejection fraction is severely decreased (<30%).   Moderate sized inferior to inferior lateral wall infarct from apex to base with no ischemia Estimated EF 28% diffuse hypokinesis worse in inferior wall  Holter  2019 personally reviewed Sinus rhythm Frequent premature ventricular contractions (23,474 during monitoring period) with one five beat run of ventricular tachycardia Rare premature atrial contractions (49 during monitoring period)  ASSESSMENT AND PLAN:  1.  PVCs: 22% by cardiac Holter monitor.  This is likely partially the cause of his bradycardia.  PVCs unfortunately been multifocal and thus ablation is not an ideal option.  Also like to avoid cardiac procedures.  Due to that, we Marcus start him on mexiletine.  I Marcus get a 3-day ZIO monitor in 3 months to reassess his PVC burden.  2.  Likely nonischemic cardiomyopathy: Mild coronary artery disease on cath.  Could be PVC induced cardiomyopathy, but his cardiomyopathy has been long-lasting and thus I am unsure of whether he Marcus have improvement even if we can reduce his PVC burden.  We Marcus start him on mexiletine.  3.  History of mitral valve repair: Stable on most recent echo.  Follow-up with general cardiology.  Case discussed with general cardiology  Current medicines are reviewed at length with the patient today.   The patient does not have concerns regarding his medicines.  The following changes were made today: Start mexiletine 200 mg twice daily  Labs/ tests ordered today include:  Orders Placed This Encounter  Procedures  . LONG TERM MONITOR (3-14 DAYS)  . EKG 12-Lead     Disposition:   FU with [name] 3 months  Signed, Marcus Meredith Leeds, MD  02/09/2019 9:30 AM     [facility]  [facility] [address] [place] [address] [phone] (office) [phone] (fax)

## 2019-02-09 NOTE — Patient Instructions (Addendum)
Medication Instructions:  Your physician has recommended you make the following change in your medication:  1. START Mexiletine 200 mg twice a day  * If you need a refill on your cardiac medications before your next appointment, please call your pharmacy.   Labwork: None ordered  Testing/Procedures: Your physician has recommended that you wear a 3 day monitor in 3 months.  Follow-Up: At [facility], you and your health needs are our priority.  As part of our continuing mission to provide you with exceptional heart care, we have created designated Provider Care Teams.  These Care Teams include your primary Cardiologist (physician) and Advanced Practice Providers (APPs -  Physician Assistants and Nurse Practitioners) who all work together to provide you with the care you need, when you need it.  You will need a follow up appointment in 3 months - after your monitor is completed.  You may see Dr Elberta Fortis or one of the following Advanced Practice Providers on your designated Care Team:    Gypsy Balsam, NP  Francis Dowse, PA-C  Otilio Saber, [place]  Thank you for choosing [facility]!!   Dory Horn, RN [phone]  Any Other Special Instructions Will Be Listed Below (If Applicable).  Mexiletine capsules What is this medicine? MEXILETINE (mex IL e teen) is an antiarrhythmic agent. This medicine is used to treat irregular heart rhythm and can slow rapid heartbeats. It can help your heart to return to and maintain a normal rhythm. Because of the side effects caused by this medicine, it is usually used for heartbeat problems that may be life-threatening. This medicine may be used for other purposes; ask your health care provider or pharmacist if you have questions. COMMON BRAND NAME(S): Mexitil What should I tell my health care provider before I take this medicine? They need to know if you have any of these conditions:  liver disease  other heart problems  previous heart  attack  an unusual or allergic reaction to mexiletine, other medicines, foods, dyes, or preservatives  pregnant or trying to get pregnant  breast-feeding How should I use this medicine? Take this medicine by mouth with a glass of water. Follow the directions on the prescription label. It is recommended that you take this medicine with food or an antacid. Take your doses at regular intervals. Do not take your medicine more often than directed. Do not stop taking except on the advice of your doctor or health care professional. Talk to your pediatrician regarding the use of this medicine in children. Special care may be needed. Overdosage: If you think you have taken too much of this medicine contact a poison control center or emergency room at once. NOTE: This medicine is only for you. Do not share this medicine with others. What if I miss a dose? If you miss a dose, take it as soon as you can. If it is almost time for your next dose, take only that dose. Do not take double or extra doses. What may interact with this medicine? Do not take this medicine with any of the following medications:  dofetilide This medicine may also interact with the following medications:  caffeine  cimetidine  medicines for depression, anxiety, or psychotic disturbances  medicines to control heart rhythm  phenobarbital  phenytoin  rifampin  theophylline This list may not describe all possible interactions. Give your health care provider a list of all the medicines, herbs, non-prescription drugs, or dietary supplements you use. Also tell them if you smoke, drink  alcohol, or use illegal drugs. Some items may interact with your medicine. What should I watch for while using this medicine? Your condition will be monitored closely when you first begin therapy. Often, this drug is first started in a hospital or other monitored health care setting. Once you are on maintenance therapy, visit your doctor or health  care provider for regular checks on your progress. Because your condition and use of this medicine carry some risk, it is a good idea to carry an identification card, necklace or bracelet with details of your condition, medications, and doctor or health care provider. You may get drowsy or dizzy. Do not drive, use machinery, or do anything that needs mental alertness until you know how this medicine affects you. Do not stand or sit up quickly, especially if you are an older patient. This reduces the risk of dizzy or fainting spells. Alcohol can make you more dizzy, increase flushing and rapid heartbeats. Avoid alcoholic drinks. This medicine may cause serious skin reactions. They can happen weeks to months after starting the medicine. Contact your health care provider right away if you notice fevers or flu-like symptoms with a rash. The rash may be red or purple and then turn into blisters or peeling of the skin. Or, you might notice a red rash with swelling of the face, lips or lymph nodes in your neck or under your arms. What side effects may I notice from receiving this medicine? Side effects that you should report to your doctor or health care professional as soon as possible:  allergic reactions like skin rash, itching or hives, swelling of the face, lips, or tongue  breathing problems  chest pain, continued irregular heartbeats  rash, fever, and swollen lymph nodes  redness, blistering, peeling or loosening of the skin, including inside the mouth  seizures  skin rash  trembling, shaking  unusual bleeding or bruising  unusually weak or tired Side effects that usually do not require medical attention (report to your doctor or health care professional if they continue or are bothersome):  blurred vision  difficulty walking  heartburn  nausea, vomiting  nervousness  numbness, or tingling in the fingers or toes This list may not describe all possible side effects. Call your  doctor for medical advice about side effects. You may report side effects to FDA at 1-800-FDA-1088. Where should I keep my medicine? Keep out of reach of children. Store at room temperature between 15 and 30 degrees C (59 and 86 degrees F). Throw away any unused medicine after the expiration date. NOTE: This sheet is a summary. It may not cover all possible information. If you have questions about this medicine, talk to your doctor, pharmacist, or health care provider.  2020 Elsevier/Gold Standard (2018-06-14 09:25:42)   Ambulatory Cardiac Monitoring An ambulatory cardiac monitor is a small recording device that is used to detect abnormal heart rhythms (arrhythmias). Most monitors are connected by wires to flat, sticky disks (electrodes) that are then attached to your chest. You may need to wear a monitor if you have had symptoms such as:  Fast heartbeats (palpitations).  Dizziness.  Fainting or light-headedness.  Unexplained weakness.  Shortness of breath. There are several types of monitors. Some common monitors include:  Holter monitor. This records your heart rhythm continuously, usually for 24-48 hours.  Event (episodic) monitor. This monitor has a symptoms button, and when pushed, it will begin recording. You need to activate this monitor to record when you have a heart-related symptom.  Automatic detection monitor. This monitor will begin recording when it detects an abnormal heartbeat. What are the risks? Generally, these devices are safe to use. However, it is possible that the skin under the electrodes will become irritated. How to prepare for monitoring Your health care provider will prepare your chest for the electrode placement and show you how to use the monitor.  Do not apply lotions to your chest before monitoring.  Follow directions on how to care for the monitor, and how to return the monitor when the testing period is complete. How to use your cardiac monitor   Follow directions about how long to wear the monitor, and if you can take the monitor off in order to shower or bathe. ? Do not let the monitor get wet. ? Do not bathe, swim, or use a hot tub while wearing the monitor.  Keep your skin clean. Do not put body lotion or moisturizer on your chest.  Change the electrodes as told by your health care provider, or any time they stop sticking to your skin. You may need to use medical tape to keep them on.  Try to put the electrodes in slightly different places on your chest to help prevent skin irritation. Follow directions from your health care provider about where to place the electrodes.  Make sure the monitor is safely clipped to your clothing or in a location close to your body as recommended by your health care provider.  If your monitor has a symptoms button, press the button to mark an event as soon as you feel a heart-related symptom, such as: ? Dizziness. ? Weakness. ? Light-headedness. ? Palpitations. ? Thumping or pounding in your chest. ? Shortness of breath. ? Unexplained weakness.  Keep a diary of your activities, such as walking, doing chores, and taking medicine. It is very important to note what you were doing when you pushed the button to record your symptoms. This will help your health care provider determine what might be contributing to your symptoms.  Send the recorded information as recommended by your health care provider. It may take some time for your health care provider to process the results.  Change the batteries as told by your health care provider.  Keep electronic devices away from your monitor. These include: ? Tablets. ? MP3 players. ? Cell phones.  While wearing your monitor you should avoid: ? Electric blankets. ? Firefighterlectric razors. ? Electric toothbrushes. ? Microwave ovens. ? Magnets. ? Metal detectors. Get help right away if:  You have chest pain.  You have shortness of breath or extreme  difficulty breathing.  You develop a very fast heartbeat that does not get better.  You develop dizziness that does not go away.  You faint or constantly feel like you are about to faint. Summary  An ambulatory cardiac monitor is a small recording device that is used to detect abnormal heart rhythms (arrhythmias).  Make sure you understand how to send the information from the monitor to your health care provider.  It is important to press the button on the monitor when you have any heart-related symptoms.  Keep a diary of your activities, such as walking, doing chores, and taking medicine. It is very important to note what you were doing when you pushed the button to record your symptoms. This will help your health care provider learn what might be causing your symptoms. This information is not intended to replace advice given to you by your health care provider.  Make sure you discuss any questions you have with your health care provider. Document Released: 12/16/2007 Document Revised: 02/18/2017 Document Reviewed: 02/21/2016 Elsevier Patient Education  2020 [employer].

## 2019-02-12 ENCOUNTER — Other Ambulatory Visit: Payer: Self-pay | Admitting: Cardiovascular Disease

## 2019-02-12 ENCOUNTER — Ambulatory Visit (INDEPENDENT_AMBULATORY_CARE_PROVIDER_SITE_OTHER): Payer: 59 | Admitting: Pharmacist

## 2019-02-12 ENCOUNTER — Other Ambulatory Visit: Payer: Self-pay

## 2019-02-12 DIAGNOSIS — Z9889 Other specified postprocedural states: Secondary | ICD-10-CM | POA: Diagnosis not present

## 2019-02-12 DIAGNOSIS — Z86711 Personal history of pulmonary embolism: Secondary | ICD-10-CM | POA: Diagnosis not present

## 2019-02-12 DIAGNOSIS — I2699 Other pulmonary embolism without acute cor pulmonale: Secondary | ICD-10-CM | POA: Diagnosis not present

## 2019-02-12 DIAGNOSIS — Z5181 Encounter for therapeutic drug level monitoring: Secondary | ICD-10-CM

## 2019-02-12 DIAGNOSIS — Z7901 Long term (current) use of anticoagulants: Secondary | ICD-10-CM | POA: Diagnosis not present

## 2019-02-12 LAB — POCT INR: INR: 2.2 (ref 2.0–3.0)

## 2019-02-12 MED FILL — TAMSULOSIN HCL 0.4 MG CAP: 0.4 | 30 days supply | Qty: 30 | Fill #0

## 2019-02-12 MED FILL — WARFARIN SODIUM 5 MG TABLET: 5 | 90 days supply | Qty: 150 | Fill #0

## 2019-02-12 MED FILL — BENAZEPRIL HCL 20 MG TABLET: 20 | 90 days supply | Qty: 90 | Fill #0

## 2019-02-12 NOTE — Patient Instructions (Signed)
Description    Continue taking 1 tablet daily except 2 tablets on Mondays and Fridays. Resume normal green intake. Recheck INR in 6 weeks. [facility] [phone]     

## 2019-02-28 ENCOUNTER — Telehealth: Payer: Self-pay | Admitting: *Deleted

## 2019-02-28 ENCOUNTER — Telehealth: Payer: Self-pay | Admitting: Cardiology

## 2019-02-28 DIAGNOSIS — I493 Ventricular premature depolarization: Secondary | ICD-10-CM

## 2019-02-28 DIAGNOSIS — Z79899 Other long term (current) drug therapy: Secondary | ICD-10-CM

## 2019-02-28 NOTE — Telephone Encounter (Signed)
Follow Up:   Pt is calling to find out what is the status of his Monitor please?

## 2019-02-28 NOTE — Telephone Encounter (Signed)
Plan for amiodarone vs sotalol.  Ok to hold mexiletine.

## 2019-02-28 NOTE — Telephone Encounter (Signed)
Informed of recommendation.  Pt states he has not worn a monitor for over a year now and is confused at to why he needs any medication.  Why are we using a PVC burden from 2019?  Explained to pt that he may have been using data from recent hospitalization to arrive at conclusion to start medication to control his PVCs. Pt aware I will re-discuss w/ Dr. Curt Bears and call him by next week w/ advisement as Dr. Curt Bears is not in the office the remainder of this week. Patient verbalized understanding and agreeable to plan.  Pt aware to not take anymore Mexiletine.  He is agreeable.

## 2019-02-28 NOTE — Telephone Encounter (Signed)
02/09/19 OV note stated to wear a 3 day ZIO XT patch monitor 3 months after starting Mexiletine.  Patient states Mexiletine has been discontinued.   Dr. Curt Bears nurse is going to let him know if they wanted to do the monitor now or after trying a different medication.  Sending message to Trinidad Curet, RN to please let monitor department know how to proceed.

## 2019-02-28 NOTE — Telephone Encounter (Signed)
Pt reports that he "is not doing well" since starting Mexiletine. States he isn't passing urine like he usually does - describes "slow stream, then stops, then starts back slow again". Also states he has "shakes in both arms", like someone w/ Parkinsons.  He cannot hold his hand out w/o it shaking.  Reviewed adverse reactions for this medication --   Neuromuscular & skeletal: Tremor (13%)     <1%: urinary hesitancy, urinary retention Pt advised to hold medication until further reviewed by Dr. Curt Bears. Aware I will call him back today once advised on.

## 2019-02-28 NOTE — Telephone Encounter (Signed)
New Message:      Please call, questions abou the new medicine(Mexiletine) started on 2 weeks ago.

## 2019-03-05 ENCOUNTER — Telehealth: Payer: Self-pay | Admitting: *Deleted

## 2019-03-05 NOTE — Addendum Note (Signed)
Addended by: Stanton Kidney on: 03/05/2019 01:10 PM   Modules accepted: Orders

## 2019-03-05 NOTE — Telephone Encounter (Signed)
Followed up w/ pt. Informed that Dr. Curt Bears is agreeable to obtaining a more recent PVC burden before advising on medication. Pt aware office will contact him to arrange monitor.  Old order cancelled, new monitor order placed for this. Will forward to monitor team to arrange 3 day Zio for PVC burden.

## 2019-03-05 NOTE — Telephone Encounter (Signed)
Patient enrolled for a 3 day ZIO XT long term holterm monitor to be mailed to his home.

## 2019-03-07 ENCOUNTER — Other Ambulatory Visit: Payer: Self-pay | Admitting: *Deleted

## 2019-03-07 DIAGNOSIS — H524 Presbyopia: Secondary | ICD-10-CM | POA: Diagnosis not present

## 2019-03-07 DIAGNOSIS — Z1211 Encounter for screening for malignant neoplasm of colon: Secondary | ICD-10-CM

## 2019-03-07 LAB — POC HEMOCCULT BLD/STL (HOME/3-CARD/SCREEN)
Card #2 Fecal Occult Blod, POC: NEGATIVE
Card #3 Fecal Occult Blood, POC: NEGATIVE
Fecal Occult Blood, POC: NEGATIVE

## 2019-03-07 LAB — HM DIABETES EYE EXAM

## 2019-03-08 ENCOUNTER — Encounter: Payer: Self-pay | Admitting: *Deleted

## 2019-03-12 DIAGNOSIS — Z1212 Encounter for screening for malignant neoplasm of rectum: Secondary | ICD-10-CM | POA: Diagnosis not present

## 2019-03-12 DIAGNOSIS — Z1211 Encounter for screening for malignant neoplasm of colon: Secondary | ICD-10-CM | POA: Diagnosis not present

## 2019-03-13 MED FILL — FENOFIBRATE 134 MG CAPSULE: 134 | 90 days supply | Qty: 90 | Fill #1

## 2019-03-17 ENCOUNTER — Ambulatory Visit (INDEPENDENT_AMBULATORY_CARE_PROVIDER_SITE_OTHER): Payer: 59

## 2019-03-17 DIAGNOSIS — Z79899 Other long term (current) drug therapy: Secondary | ICD-10-CM

## 2019-03-17 DIAGNOSIS — I493 Ventricular premature depolarization: Secondary | ICD-10-CM | POA: Diagnosis not present

## 2019-03-26 ENCOUNTER — Encounter (INDEPENDENT_AMBULATORY_CARE_PROVIDER_SITE_OTHER): Payer: Self-pay

## 2019-03-26 ENCOUNTER — Ambulatory Visit (INDEPENDENT_AMBULATORY_CARE_PROVIDER_SITE_OTHER): Payer: PPO

## 2019-03-26 ENCOUNTER — Other Ambulatory Visit: Payer: Self-pay

## 2019-03-26 DIAGNOSIS — Z9889 Other specified postprocedural states: Secondary | ICD-10-CM | POA: Diagnosis not present

## 2019-03-26 DIAGNOSIS — Z5181 Encounter for therapeutic drug level monitoring: Secondary | ICD-10-CM | POA: Diagnosis not present

## 2019-03-26 DIAGNOSIS — I2699 Other pulmonary embolism without acute cor pulmonale: Secondary | ICD-10-CM | POA: Diagnosis not present

## 2019-03-26 DIAGNOSIS — Z7901 Long term (current) use of anticoagulants: Secondary | ICD-10-CM

## 2019-03-26 DIAGNOSIS — Z86711 Personal history of pulmonary embolism: Secondary | ICD-10-CM

## 2019-03-26 LAB — POCT INR: INR: 2.6 (ref 2.0–3.0)

## 2019-03-26 NOTE — Patient Instructions (Signed)
Description    Continue taking 1 tablet daily except 2 tablets on Mondays and Fridays. Resume normal green intake. Recheck INR in 6 weeks. [facility] [phone]

## 2019-04-03 NOTE — Telephone Encounter (Signed)
[employer] received monitor.  As of 03/28/2019 monitor is being processed by [employer].

## 2019-04-09 ENCOUNTER — Telehealth: Payer: Self-pay | Admitting: Cardiology

## 2019-04-09 NOTE — Telephone Encounter (Signed)
Patient is calling in regards to long term monitor results. Please advise.

## 2019-04-09 NOTE — Telephone Encounter (Signed)
Informed patient of results and verbal understanding expressed. Pt reports feeling fine and will continue to monitor.  He will call the office if issues arise prior to follow up in April

## 2019-04-10 ENCOUNTER — Other Ambulatory Visit: Payer: Self-pay | Admitting: Internal Medicine

## 2019-04-10 MED ORDER — EZETIMIBE 10 MG PO TABS: ORAL_TABLET | ORAL | 3 refills | Status: AC

## 2019-04-16 ENCOUNTER — Ambulatory Visit: Payer: Self-pay | Admitting: Adult Health

## 2019-04-30 NOTE — Progress Notes (Signed)
MEDICARE ANNUAL WELLNESS VISIT AND FOLLOW UP Assessment:   Encounter for Medicare annual wellness exam  Essential hypertension - continue medications, DASH diet, exercise and monitor at home. Call if greater than 130/80.  -     CBC with Differential/Platelet -     CMP/GFR -     TSH  Type 2 diabetes mellitus with stage 3 chronic kidney disease, without long-term current use of insulin (HCC) Discussed general issues about diabetes pathophysiology and management., Educational material distributed., Suggested low cholesterol diet., Encouraged aerobic exercise., Discussed foot care., Reminded to get yearly retinal exam. -     Hemoglobin A1c  Stage 3 CKD associated with T2DM Increase fluids, avoid NSAIDS, monitor sugars, will monitor Not on metformin; will avoid due to GFR  Retinopathy due to T2DM Control glucose, htn, follow up as recommended with ophthalmology  Chronic seasonal allergic rhinitis, unspecified trigger - Allegra OTC, increase H20, allergy hygiene explained.  Osteoarthritis, unspecified osteoarthritis type, unspecified site Monitor; established with ortho  Mixed hyperlipidemia -continue medications, check lipids, decrease fatty foods, increase activity.  - statin intolerant, declines other medications -     Lipid panel  Vitamin D deficiency Has not increased dose; defer   Medication management -     Magnesium  Overweight Long discussion about weight loss, diet, and exercise Recommended diet heavy in fruits and veggies and low in animal meats, cheeses, and dairy products, appropriate calorie intake Patient will work on continue with portion control, add 30 min x 3 days a week of exercise Discussed appropriate weight for height, set goal weight of 207lb Follow up at next visit  Hx of colonic polyps Up to date, due 2026  S/P mitral valve repair Cont follow up cardio  Hx of PE Continue long term coumadin unless bleeding concerns; follows with coumadin  clinic Present to ED for any falls/head trauma  Long term current use of anticoagulant therapy Continue coumadin clinic  Over 30 minutes of exam, counseling, chart review, and critical decision making was performed  Future Appointments  Date Time Provider [place]  05/07/2019  8:30 AM [facility]  06/22/2019 10:30 AM Constance Haw, MD [facility]  08/06/2019 10:30 AM Unk Pinto, MD [facility] None  02/12/2020 10:00 AM Unk Pinto, MD [facility] None     Plan:   During the course of the visit the patient was educated and counseled about appropriate screening and preventive services including:    Pneumococcal vaccine   Influenza vaccine  Prevnar 13  Td vaccine  Screening electrocardiogram  Colorectal cancer screening  Diabetes screening  Glaucoma screening  Nutrition counseling    Subjective:  [name] is a 74 y.o. AA male who presents for Medicare Annual Wellness Visit and 3 month follow up for HTN, hyperlipidemia, diabetes, and vitamin D Def.   BMI is Body mass index is 29.67 kg/m., he has not been working on diet and exercise, portions smaller than previous. Admits minimal exercise with cold weather.  Wt Readings from Last 3 Encounters:  05/01/19 218 lb 12.8 oz (99.2 kg)  02/09/19 219 lb 12.8 oz (99.7 kg)  01/26/19 219 lb (99.3 kg)   His blood pressure has been controlled at home, today their BP is BP: 110/70 He does not workout. He denies chest pain, shortness of breath, dizziness. He has some exertional dyspnea which he attributes to lack of exercise, denies accompaniments.  He has history of CAD with cath in 2009 showing mild CAD and severe MR, had  MVR in 2014, follows with Dr. Julianne Handler. Recent Nuclear stress test estimated EF at 28% but subsequent ECHO showed EF 35-40%. There is mild narrowing of the mitral valve repair.   He has history of PE provoked by ortho surgery in  2010 and again in 2012, now on coumadin since that time, managed via coumadin clinic:  Lab Results  Component Value Date   INR 2.6 03/26/2019   INR 2.2 02/12/2019   INR 3.0 01/17/2019   PROTIME 18.2 09/03/2008    He is on cholesterol medication (zetia 10 mg daily, fenofibrate, hx of statin intolerance, declines other medications, has discussed with cardiology) and denies myalgias. His cholesterol is not at goal. The cholesterol last visit was:   Lab Results  Component Value Date   CHOL 178 01/23/2019   HDL 45 01/23/2019   LDLCALC 108 (H) 01/23/2019   TRIG 142 01/23/2019   CHOLHDL 4.0 01/23/2019   He has been working on diet and exercise for diet-controlled Diabetes with diabetic chronic kidney disease and with diabetic retinopathy; mild non-proliferative; without macular edema (followed by Dr. Nicki Reaper), he is not on bASA due to coumadin use, he is on ACE/ARB, and denies paresthesia of the feet, polydipsia, polyuria and visual disturbances. He admits glucometer doesn't seem to be working well. Last A1C was:  Lab Results  Component Value Date   HGBA1C 6.3 (H) 01/23/2019   He has CKD IIIa/b related to T2DM and htn monitored at this office:  Lab Results  Component Value Date   GFRAA 49 (L) 01/23/2019    Patient is on Vitamin D supplement.   Lab Results  Component Value Date   VD25OH 41 01/23/2019       Medication Review: Current Outpatient Medications on File Prior to Visit  Medication Sig Dispense Refill  . acetaminophen (TYLENOL) 500 MG tablet Take 1,000 mg by mouth every 6 (six) hours as needed for moderate pain or headache.     . Artificial Tear Solution (SOOTHE XP OP) Place 1 drop into both eyes daily as needed (dry eyes).    . benazepril (LOTENSIN) 20 MG tablet Take 10 mg by mouth daily.    . bisacodyl (DULCOLAX) 5 MG EC tablet Take 10 mg by mouth daily as needed for moderate constipation.    . blood glucose meter kit and supplies KIT Dispense based on insurance  preference.check blood sugar 1 time daily. DX-E11.22 1 each 0  . Cholecalciferol (VITAMIN D3) 5000 units CAPS Take 5,000 Units by mouth at bedtime.    Marland Kitchen CINNAMON PO Take 2,000 mg by mouth every Monday, Wednesday, and Friday. At bedtime    . ezetimibe (ZETIA) 10 MG tablet Take 1 tablet Daily for cholesterol 90 tablet 3  . fenofibrate micronized (LOFIBRA) 134 MG capsule Take 134 mg by mouth daily.    Marland Kitchen loratadine (CLARITIN) 10 MG tablet Take 10 mg by mouth daily as needed for allergies.    . mometasone (NASONEX) 50 MCG/ACT nasal spray Place 2 sprays into the nose daily as needed (allergies).     Marland Kitchen OVER THE COUNTER MEDICATION Apply 1 application topically daily as needed (joint pain). Glucosamine Chondroitin MSM topical cream    . tamsulosin (FLOMAX) 0.4 MG CAPS capsule Take 0.4 mg by mouth at bedtime as needed.    . warfarin (COUMADIN) 5 MG tablet Take 1 to 2 tablets daily as directed by Coumadin clinic 150 tablet 0   No current facility-administered medications on file prior to visit.    Allergies: Allergies  Allergen Reactions  . Statins Other (See Comments)    muscle aches  . Welchol [Colesevelam Hcl] Other (See Comments)    Muscle weakness    Current Problems (verified) has History of pulmonary embolus (PE); Long term current use of anticoagulant therapy; S/P mitral valve repair; Hyperlipidemia associated with type 2 diabetes mellitus ([place]); Hypertension; Seasonal allergies; Osteoarthritis; Vitamin D deficiency; Medication management; DM type 2 causing CKD stage 3 ([place]); Overweight (BMI 25.0-29.9); Hx of colonic polyps; Encounter for Medicare annual wellness exam; Encounter for therapeutic drug monitoring; CKD stage 3 due to type 2 diabetes mellitus ([place]); and Diabetic retinopathy ([place]) on their problem list.  Screening Tests Immunization History  Administered Date(s) Administered  . Influenza, High Dose Seasonal PF 12/20/2013, 01/14/2015, 01/06/2016, 12/28/2016, 01/05/2018, 12/21/2018   . Influenza-Unspecified 01/20/2013  . PFIZER SARS-COV-2 Vaccination 04/20/2019  . Pneumococcal Conjugate-13 02/23/2016  . Pneumococcal-Unspecified 05/11/2012  . Td 11/22/2003  . Tdap 07/23/2017   Preventative care: Last colonoscopy: 06/2014 recommended 10 year  CXR 11/2018 DEXA 2010  Prior vaccinations: TD or Tdap: 2019  Influenza: 2020 Pneumococcal: 2014 Prevnar13: 2017 Shingles/Zostavax: Declines due to cost  Names of Other Physician/Practitioners you currently use: 1. [facility] here for primary care 2. Dr. Nicki Reaper, eye doctor, last visit 03/07/2019 - retinopathy- report abstracted 3. Dr. Marland Kitchen dentist, last visit 2021  Patient Care Team: Unk Pinto, MD as PCP - General (Internal Medicine) Burnell Blanks, MD as PCP - Cardiology (Cardiology) Constance Haw, MD as PCP - Electrophysiology (Cardiology) Irene Shipper, MD as Consulting Physician (Gastroenterology) Burnell Blanks, MD as Consulting Physician (Cardiology) Paralee Cancel, MD as Consulting Physician (Orthopedic Surgery)  Surgical: He  has a past surgical history that includes achillis tendon; Hip Arthroplasty; Total hip arthroplasty (08/24/2011); TEE without cardioversion (N/A, 07/04/2012); Cardiac catheterization; Mitral valve repair (Right, 08/15/2012); Intraoprative transesophageal echocardiogram (N/A, 08/15/2012); Colonoscopy; Colonoscopy w/ biopsies and polypectomy; Inguinal hernia repair (Right, 11/07/2015); Insertion of mesh (Right, 11/07/2015); and Shoulder arthroscopy with rotator cuff repair and subacromial decompression (Left, 01/27/2018). Family His family history includes Coronary artery disease in an other family member; Diabetes in his brother and sister; Heart attack in his father; Heart attack (age of onset: 68) in his brother; Hyperlipidemia in his sister; Hypertension in his brother, father, and sister. Social history  He reports that he quit  smoking about 46 years ago. His smoking use included cigars. His smokeless tobacco use includes chew. He reports that he does not drink alcohol or use drugs.  MEDICARE WELLNESS OBJECTIVES: Physical activity: Current Exercise Habits: The patient does not participate in regular exercise at present, Exercise limited by: None identified Cardiac risk factors: Cardiac Risk Factors include: advanced age (>73mn, >>61women);male gender;hypertension;dyslipidemia;sedentary lifestyle;diabetes mellitus Depression/mood screen:   [facility]  Decreased Interest 0  Down, Depressed, Hopeless 0  PHQ - 2 Score 0  Some recent data might be hidden    ADLs:  In your present state of health, do you have any difficulty performing the following activities: 05/01/2019 01/27/2019  Hearing? N N  Vision? N N  Difficulty concentrating or making decisions? N N  Walking or climbing stairs? N N  Dressing or bathing? N N  Doing errands, shopping? N N  Some recent data might be hidden     Cognitive Testing  Alert? Yes  Normal Appearance?Yes  Oriented to person? Yes  Place? Yes   Time? Yes  Recall of three objects?  Yes  Can perform simple  calculations? Yes  Displays appropriate judgment?Yes  Can read the correct time from a watch face?Yes  EOL planning: Does Patient Have a Medical Advance Directive?: Yes Type of Advance Directive: Healthcare Power of Attorney, Living will Does patient want to make changes to medical advance directive?: No - Patient declined Copy of [place] in Chart?: No - copy requested   Objective:   Today's Vitals   05/01/19 0904  BP: 110/70  Pulse: 78  Temp: (!) 97.3 F (36.3 C)  SpO2: 97%  Weight: 218 lb 12.8 oz (99.2 kg)  PainSc: 4   PainLoc: Back   Body mass index is 29.67 kg/m.  General appearance: alert, no distress, WD/WN, male HEENT: normocephalic, sclerae anicteric, TMs pearly, nares patent, no discharge or erythema, pharynx  normal Oral cavity: MMM, no lesions Neck: supple, no lymphadenopathy, no thyromegaly, no masses Heart: RRR, normal S1, S2, no murmurs Lungs: CTA bilaterally, no wheezes, rhonchi, or rales Abdomen: +bs, soft, non tender, non distended, no masses, no hepatomegaly, no splenomegaly Musculoskeletal: nontender, no swelling, no obvious deformity Extremities: no edema, no cyanosis, no clubbing Pulses: 2+ symmetric, upper and lower extremities, normal cap refill Neurological: alert, oriented x 3, CN2-12 intact, strength normal upper extremities and lower extremities, sensation normal throughout, DTRs 2+ throughout, no cerebellar signs, gait normal Psychiatric: normal affect, behavior normal, pleasant   Medicare Attestation I have personally reviewed: The patient's medical and social history Their use of alcohol, tobacco or illicit drugs Their current medications and supplements The patient's functional ability including ADLs,fall risks, home safety risks, cognitive, and hearing and visual impairment Diet and physical activities Evidence for depression or mood disorders  The patient's weight, height, BMI, and visual acuity have been recorded in the chart.  I have made referrals, counseling, and provided education to the patient based on review of the above and I have provided the patient with a written personalized care plan for preventive services.     Izora Ribas, NP   05/01/2019

## 2019-05-01 ENCOUNTER — Ambulatory Visit (INDEPENDENT_AMBULATORY_CARE_PROVIDER_SITE_OTHER): Payer: PPO | Admitting: Adult Health

## 2019-05-01 ENCOUNTER — Other Ambulatory Visit: Payer: Self-pay

## 2019-05-01 ENCOUNTER — Encounter: Payer: Self-pay | Admitting: Adult Health

## 2019-05-01 VITALS — BP 110/70 | HR 78 | Temp 97.3°F | Wt 218.8 lb

## 2019-05-01 DIAGNOSIS — E1122 Type 2 diabetes mellitus with diabetic chronic kidney disease: Secondary | ICD-10-CM

## 2019-05-01 DIAGNOSIS — Z860100 Personal history of colon polyps, unspecified: Secondary | ICD-10-CM

## 2019-05-01 DIAGNOSIS — Z79899 Other long term (current) drug therapy: Secondary | ICD-10-CM

## 2019-05-01 DIAGNOSIS — Z9889 Other specified postprocedural states: Secondary | ICD-10-CM

## 2019-05-01 DIAGNOSIS — Z0001 Encounter for general adult medical examination with abnormal findings: Secondary | ICD-10-CM | POA: Diagnosis not present

## 2019-05-01 DIAGNOSIS — M1990 Unspecified osteoarthritis, unspecified site: Secondary | ICD-10-CM

## 2019-05-01 DIAGNOSIS — Z86711 Personal history of pulmonary embolism: Secondary | ICD-10-CM

## 2019-05-01 DIAGNOSIS — I10 Essential (primary) hypertension: Secondary | ICD-10-CM | POA: Diagnosis not present

## 2019-05-01 DIAGNOSIS — Z0000 Encounter for general adult medical examination without abnormal findings: Secondary | ICD-10-CM

## 2019-05-01 DIAGNOSIS — E785 Hyperlipidemia, unspecified: Secondary | ICD-10-CM | POA: Diagnosis not present

## 2019-05-01 DIAGNOSIS — R6889 Other general symptoms and signs: Secondary | ICD-10-CM

## 2019-05-01 DIAGNOSIS — J302 Other seasonal allergic rhinitis: Secondary | ICD-10-CM

## 2019-05-01 DIAGNOSIS — E663 Overweight: Secondary | ICD-10-CM

## 2019-05-01 DIAGNOSIS — Z5181 Encounter for therapeutic drug level monitoring: Secondary | ICD-10-CM | POA: Diagnosis not present

## 2019-05-01 DIAGNOSIS — N183 Chronic kidney disease, stage 3 unspecified: Secondary | ICD-10-CM

## 2019-05-01 DIAGNOSIS — E559 Vitamin D deficiency, unspecified: Secondary | ICD-10-CM

## 2019-05-01 DIAGNOSIS — E1169 Type 2 diabetes mellitus with other specified complication: Secondary | ICD-10-CM | POA: Diagnosis not present

## 2019-05-01 DIAGNOSIS — E11319 Type 2 diabetes mellitus with unspecified diabetic retinopathy without macular edema: Secondary | ICD-10-CM | POA: Diagnosis not present

## 2019-05-01 MED ORDER — GLUCOSE BLOOD VI STRP: ORAL_STRIP | 12 refills | Status: AC

## 2019-05-01 MED ORDER — BLOOD GLUCOSE MONITORING SUPPL DEVI: 0 refills | Status: AC

## 2019-05-01 MED ORDER — LANCETS MISC: 12 refills | Status: AC

## 2019-05-01 NOTE — Patient Instructions (Signed)
[name] , Thank you for taking time to come for your Medicare Wellness Visit. I appreciate your ongoing commitment to your health goals. Please review the following plan we discussed and let me know if I can assist you in the future.   These are the goals we discussed: Goals    . Exercise 3x per week (30 min per time)    . HEMOGLOBIN A1C < 7.0    . Weight (lb) < 215 lb (97.5 kg)       This is a list of the screening recommended for you and due dates:  Health Maintenance  Topic Date Due  .  Hepatitis C: One time screening is recommended by Center for Disease Control  (CDC) for  adults born from [birth year] through 1964.   Jul 23, 1945  . Hemoglobin A1C  07/23/2019  . Complete foot exam   01/23/2020  . Eye exam for diabetics  03/06/2020  . Colon Cancer Screening  07/11/2024  . Tetanus Vaccine  07/24/2027  . Flu Shot  Completed  . Pneumonia vaccines  Completed      Exercising to Stay Healthy To become healthy and stay healthy, it is recommended that you do moderate-intensity and vigorous-intensity exercise. You can tell that you are exercising at a moderate intensity if your heart starts beating faster and you start breathing faster but can still hold a conversation. You can tell that you are exercising at a vigorous intensity if you are breathing much harder and faster and cannot hold a conversation while exercising. Exercising regularly is important. It has many health benefits, such as:  Improving overall fitness, flexibility, and endurance.  Increasing bone density.  Helping with weight control.  Decreasing body fat.  Increasing muscle strength.  Reducing stress and tension.  Improving overall health. How often should I exercise? Choose an activity that you enjoy, and set realistic goals. Your health care provider can help you make an activity plan that works for you. Exercise regularly as told by your health care provider. This may include:  Doing strength training two  times a week, such as: ? Lifting weights. ? Using resistance bands. ? Push-ups. ? Sit-ups. ? Yoga.  Doing a certain intensity of exercise for a given amount of time. Choose from these options: ? A total of 150 minutes of moderate-intensity exercise every week. ? A total of 75 minutes of vigorous-intensity exercise every week. ? A mix of moderate-intensity and vigorous-intensity exercise every week. Children, pregnant women, people who have not exercised regularly, people who are overweight, and older adults may need to talk with a health care provider about what activities are safe to do. If you have a medical condition, be sure to talk with your health care provider before you start a new exercise program. What are some exercise ideas? Moderate-intensity exercise ideas include:  Walking 1 mile (1.6 km) in about 15 minutes.  Biking.  Hiking.  Golfing.  Dancing.  Water aerobics. Vigorous-intensity exercise ideas include:  Walking 4.5 miles (7.2 km) or more in about 1 hour.  Jogging or running 5 miles (8 km) in about 1 hour.  Biking 10 miles (16.1 km) or more in about 1 hour.  Lap swimming.  Roller-skating or in-line skating.  Cross-country skiing.  Vigorous competitive sports, such as football, basketball, and soccer.  Jumping rope.  Aerobic dancing. What are some everyday activities that can help me to get exercise?  [place] work, such as: ? Pushing a Conservation officer, nature. ? Raking and bagging leaves.  Washing your car.  Pushing a stroller.  Shoveling snow.  Gardening.  Washing windows or floors. How can I be more active in my day-to-day activities?  Use stairs instead of an elevator.  Take a walk during your lunch break.  If you drive, park your car farther away from your work or school.  If you take public transportation, get off one stop early and walk the rest of the way.  Stand up or walk around during all of your indoor phone calls.  Get up, stretch,  and walk around every 30 minutes throughout the day.  Enjoy exercise with a friend. Support to continue exercising will help you keep a regular routine of activity. What guidelines can I follow while exercising?  Before you start a new exercise program, talk with your health care provider.  Do not exercise so much that you hurt yourself, feel dizzy, or get very short of breath.  Wear comfortable clothes and wear shoes with good support.  Drink plenty of water while you exercise to prevent dehydration or heat stroke.  Work out until your breathing and your heartbeat get faster. Where to find more information  U.S. Department of Health and Human Services: ThisPath.fi  Centers for Disease Control and Prevention (CDC): FootballExhibition.com.br Summary  Exercising regularly is important. It will improve your overall fitness, flexibility, and endurance.  Regular exercise also will improve your overall health. It can help you control your weight, reduce stress, and improve your bone density.  Do not exercise so much that you hurt yourself, feel dizzy, or get very short of breath.  Before you start a new exercise program, talk with your health care provider. This information is not intended to replace advice given to you by your health care provider. Make sure you discuss any questions you have with your health care provider. Document Revised: 02/18/2017 Document Reviewed: 01/27/2017 Elsevier Patient Education  2020 [employer].

## 2019-05-02 LAB — COMPLETE METABOLIC PANEL WITH GFR
AG Ratio: 1.8 (calc) (ref 1.0–2.5)
ALT: 15 U/L (ref 9–46)
AST: 22 U/L (ref 10–35)
Albumin: 4.6 g/dL (ref 3.6–5.1)
Alkaline phosphatase (APISO): 46 U/L (ref 35–144)
BUN/Creatinine Ratio: 15 (calc) (ref 6–22)
BUN: 24 mg/dL (ref 7–25)
CO2: 26 mmol/L (ref 20–32)
Calcium: 9.8 mg/dL (ref 8.6–10.3)
Chloride: 105 mmol/L (ref 98–110)
Creat: 1.59 mg/dL — ABNORMAL HIGH (ref 0.70–1.18)
GFR, Est African American: 50 mL/min/{1.73_m2} — ABNORMAL LOW (ref >=60)
GFR, Est Non African American: 43 mL/min/{1.73_m2} — ABNORMAL LOW (ref >=60)
Globulin: 2.5 g/dL (calc) (ref 1.9–3.7)
Glucose, Bld: 132 mg/dL — ABNORMAL HIGH (ref 65–99)
Potassium: 4.5 mmol/L (ref 3.5–5.3)
Sodium: 139 mmol/L (ref 135–146)
Total Bilirubin: 0.6 mg/dL (ref 0.2–1.2)
Total Protein: 7.1 g/dL (ref 6.1–8.1)

## 2019-05-02 LAB — CBC WITH DIFFERENTIAL/PLATELET
Absolute Monocytes: 281 cells/uL (ref 200–950)
Basophils Absolute: 61 cells/uL (ref 0–200)
Basophils Relative: 1.7 %
Eosinophils Absolute: 79 cells/uL (ref 15–500)
Eosinophils Relative: 2.2 %
HCT: 46.5 % (ref 38.5–50.0)
Hemoglobin: 15.8 g/dL (ref 13.2–17.1)
Lymphs Abs: 1379 cells/uL (ref 850–3900)
MCH: 30.7 pg (ref 27.0–33.0)
MCHC: 34 g/dL (ref 32.0–36.0)
MCV: 90.5 fL (ref 80.0–100.0)
MPV: 12 fL (ref 7.5–12.5)
Monocytes Relative: 7.8 %
Neutro Abs: 1800 cells/uL (ref 1500–7800)
Neutrophils Relative %: 50 %
Platelets: 235 10*3/uL (ref 140–400)
RBC: 5.14 10*6/uL (ref 4.20–5.80)
RDW: 13.1 % (ref 11.0–15.0)
Total Lymphocyte: 38.3 %
WBC: 3.6 10*3/uL — ABNORMAL LOW (ref 3.8–10.8)

## 2019-05-02 LAB — LIPID PANEL
Cholesterol: 178 mg/dL (ref <200)
HDL: 48 mg/dL (ref >=40)
LDL Cholesterol (Calc): 111 mg/dL (calc) — ABNORMAL HIGH
Non-HDL Cholesterol (Calc): 130 mg/dL (calc) — ABNORMAL HIGH (ref <130)
Total CHOL/HDL Ratio: 3.7 (calc) (ref <5.0)
Triglycerides: 89 mg/dL (ref <150)

## 2019-05-02 LAB — HEMOGLOBIN A1C
Hgb A1c MFr Bld: 6.6 % of total Hgb — ABNORMAL HIGH (ref <5.7)
Mean Plasma Glucose: 143 (calc)
eAG (mmol/L): 7.9 (calc)

## 2019-05-02 LAB — MAGNESIUM: Magnesium: 1.9 mg/dL (ref 1.5–2.5)

## 2019-05-02 LAB — TSH: TSH: 0.94 mIU/L (ref 0.40–4.50)

## 2019-05-09 ENCOUNTER — Other Ambulatory Visit: Payer: Self-pay

## 2019-05-09 ENCOUNTER — Ambulatory Visit (INDEPENDENT_AMBULATORY_CARE_PROVIDER_SITE_OTHER): Payer: PPO | Admitting: *Deleted

## 2019-05-09 DIAGNOSIS — Z7901 Long term (current) use of anticoagulants: Secondary | ICD-10-CM | POA: Diagnosis not present

## 2019-05-09 DIAGNOSIS — Z9889 Other specified postprocedural states: Secondary | ICD-10-CM | POA: Diagnosis not present

## 2019-05-09 DIAGNOSIS — I2699 Other pulmonary embolism without acute cor pulmonale: Secondary | ICD-10-CM

## 2019-05-09 LAB — POCT INR: INR: 3.2 — AB (ref 2.0–3.0)

## 2019-05-09 NOTE — Patient Instructions (Signed)
Description    Hold today and then continue taking 1 tablet daily except 2 tablets on Mondays and Fridays. Resume normal green intake. Recheck INR in 3 weeks. [facility] [phone]

## 2019-05-12 ENCOUNTER — Ambulatory Visit (HOSPITAL_COMMUNITY)
Admission: EM | Admit: 2019-05-12 | Discharge: 2019-05-12 | Disposition: A | Discharge status: Home / Self Care | Payer: PPO | Attending: Family Medicine | Admitting: Family Medicine

## 2019-05-12 ENCOUNTER — Other Ambulatory Visit: Payer: Self-pay

## 2019-05-12 ENCOUNTER — Encounter (HOSPITAL_COMMUNITY): Payer: Self-pay

## 2019-05-12 DIAGNOSIS — Z833 Family history of diabetes mellitus: Secondary | ICD-10-CM | POA: Insufficient documentation

## 2019-05-12 DIAGNOSIS — I2510 Atherosclerotic heart disease of native coronary artery without angina pectoris: Secondary | ICD-10-CM | POA: Insufficient documentation

## 2019-05-12 DIAGNOSIS — Z8249 Family history of ischemic heart disease and other diseases of the circulatory system: Secondary | ICD-10-CM | POA: Diagnosis not present

## 2019-05-12 DIAGNOSIS — I129 Hypertensive chronic kidney disease with stage 1 through stage 4 chronic kidney disease, or unspecified chronic kidney disease: Secondary | ICD-10-CM | POA: Insufficient documentation

## 2019-05-12 DIAGNOSIS — M791 Myalgia, unspecified site: Secondary | ICD-10-CM | POA: Insufficient documentation

## 2019-05-12 DIAGNOSIS — R1030 Lower abdominal pain, unspecified: Secondary | ICD-10-CM | POA: Diagnosis not present

## 2019-05-12 DIAGNOSIS — E1136 Type 2 diabetes mellitus with diabetic cataract: Secondary | ICD-10-CM | POA: Insufficient documentation

## 2019-05-12 DIAGNOSIS — Z20822 Contact with and (suspected) exposure to covid-19: Secondary | ICD-10-CM | POA: Insufficient documentation

## 2019-05-12 DIAGNOSIS — E11319 Type 2 diabetes mellitus with unspecified diabetic retinopathy without macular edema: Secondary | ICD-10-CM | POA: Insufficient documentation

## 2019-05-12 DIAGNOSIS — Z7951 Long term (current) use of inhaled steroids: Secondary | ICD-10-CM | POA: Diagnosis not present

## 2019-05-12 DIAGNOSIS — Z888 Allergy status to other drugs, medicaments and biological substances status: Secondary | ICD-10-CM | POA: Diagnosis not present

## 2019-05-12 DIAGNOSIS — Z7901 Long term (current) use of anticoagulants: Secondary | ICD-10-CM | POA: Diagnosis not present

## 2019-05-12 DIAGNOSIS — Z86718 Personal history of other venous thrombosis and embolism: Secondary | ICD-10-CM | POA: Diagnosis not present

## 2019-05-12 DIAGNOSIS — R5383 Other fatigue: Secondary | ICD-10-CM | POA: Diagnosis not present

## 2019-05-12 DIAGNOSIS — Z8719 Personal history of other diseases of the digestive system: Secondary | ICD-10-CM | POA: Diagnosis not present

## 2019-05-12 DIAGNOSIS — N183 Chronic kidney disease, stage 3 unspecified: Secondary | ICD-10-CM | POA: Insufficient documentation

## 2019-05-12 DIAGNOSIS — E785 Hyperlipidemia, unspecified: Secondary | ICD-10-CM | POA: Diagnosis not present

## 2019-05-12 DIAGNOSIS — Z86711 Personal history of pulmonary embolism: Secondary | ICD-10-CM | POA: Insufficient documentation

## 2019-05-12 DIAGNOSIS — Z87891 Personal history of nicotine dependence: Secondary | ICD-10-CM | POA: Insufficient documentation

## 2019-05-12 DIAGNOSIS — R519 Headache, unspecified: Secondary | ICD-10-CM | POA: Insufficient documentation

## 2019-05-12 DIAGNOSIS — Z8349 Family history of other endocrine, nutritional and metabolic diseases: Secondary | ICD-10-CM | POA: Insufficient documentation

## 2019-05-12 DIAGNOSIS — Z79899 Other long term (current) drug therapy: Secondary | ICD-10-CM | POA: Diagnosis not present

## 2019-05-12 DIAGNOSIS — E1122 Type 2 diabetes mellitus with diabetic chronic kidney disease: Secondary | ICD-10-CM | POA: Diagnosis not present

## 2019-05-12 LAB — POCT URINALYSIS DIP (DEVICE)
Bilirubin Urine: NEGATIVE
Glucose, UA: 100 mg/dL — AB
Hgb urine dipstick: NEGATIVE
Ketones, ur: NEGATIVE mg/dL
Leukocytes,Ua: NEGATIVE
Nitrite: NEGATIVE
Protein, ur: 30 mg/dL — AB
Specific Gravity, Urine: 1.025 (ref 1.005–1.030)
Urobilinogen, UA: 0.2 mg/dL (ref 0.0–1.0)
pH: 5.5 (ref 5.0–8.0)

## 2019-05-12 NOTE — Discharge Instructions (Signed)
Your COVID test is pending.  You should self quarantine until your test result is back and is negative.    Take Tylenol as needed for fever or discomfort.  Rest and keep yourself hydrated.    Go to the emergency department if you develop high fever, shortness of breath, severe diarrhea, or other concerning symptoms.    

## 2019-05-12 NOTE — ED Triage Notes (Signed)
Pt presents with slight general abdominal cramping, bloating, and unsettling with some fatigue the past few weeks.  Pt states he has had both covid vaccines in the past few weeks.

## 2019-05-12 NOTE — ED Provider Notes (Signed)
[facility]    CSN: [id number] Arrival date & time: 05/12/19  1026      History   Chief Complaint Chief Complaint  Patient presents with  . Appointment  . (10:30 Abdominal Pain & Fatigue)    HPI [name] is a 74 y.o. male.   Patient reports fatigue, muscle aches, headache, lower abdominal pain that has been going on for the last 3 days.  Patient received his second Covid vaccine on 05/04/2019.  Denies fever, constipation, dysuria, shortness of breath, chest pain, rash, other symptoms.  Reports that he has had a hernia repair on his right lower abdomen in the past.  Reports that he has an appointment to see his primary care next week for this.  ROS per HPI  The history is provided by the patient.    Past Medical History:  Diagnosis Date  . Allergy    SEASONAL  . Cataract    SMALL IN LEFT EYE  . CKD (chronic kidney disease), stage III   . Coronary artery disease    a. Cath 2014 - mild nonobstructive CAD - 40% prox LAD, 30% OM2, otherwise mild irregularities.  . Degenerative joint disease   . Frequent PVCs   . Headache   . Hyperlipidemia   . Hypertension   . Inguinal hernia    right  . NICM (nonischemic cardiomyopathy) ([place])   . Osteoarthritis   . Pre-diabetes   . Pulmonary embolism [facility])    march 2010, 2012              /   DVT x  2  LEFT 2010  . S/P mitral valve repair 08/15/2012   Complex valvuloplasty including triangular resection of posterior leaflet, artificial Gore-tex neocord placement x4 and Sorin Memo 3D ring annuloplasty via right mini thoracotomy approach  . Seasonal allergies   . Severe mitral regurgitation by prior echocardiogram    a. s/p MV repair 2014.  . Sinus brady-tachy syndrome ([place])   . Vitamin D deficiency   . Wears glasses     Patient Active Problem List   Diagnosis Date Noted  . Diabetic retinopathy ([place]) 04/11/2018  . CKD stage 3 due to type 2 diabetes mellitus ([place]) 04/10/2018  . Encounter for therapeutic drug  monitoring 08/10/2017  . Encounter for Medicare annual wellness exam 02/23/2016  . Hx of colonic polyps 05/31/2014  . Overweight (BMI 25.0-29.9) 04/24/2014  . Medication management 05/24/2013  . DM type 2 causing CKD stage 3 ([place]) 05/24/2013  . Hyperlipidemia associated with type 2 diabetes mellitus ([place])   . Hypertension   . Seasonal allergies   . Osteoarthritis   . Vitamin D deficiency   . S/P mitral valve repair 08/15/2012  . Long term current use of anticoagulant therapy 11/05/2010  . History of pulmonary embolus (PE) 06/10/2008    Past Surgical History:  Procedure Laterality Date  . achillis tendon     repair 20 years ago  . CARDIAC CATHETERIZATION    . COLONOSCOPY    . COLONOSCOPY W/ BIOPSIES AND POLYPECTOMY    . HIP ARTHROPLASTY     11/06/07  . INGUINAL HERNIA REPAIR Right 11/07/2015   Procedure: LAPAROSCOPIC RIGHT INGUINAL HERNIA WITH MESH;  Surgeon: Ralene Ok, MD;  Location: [facility];  Service: General;  Laterality: Right;  . INSERTION OF MESH Right 11/07/2015   Procedure: INSERTION OF MESH;  Surgeon: Ralene Ok, MD;  Location: [facility];  Service: General;  Laterality: Right;  . INTRAOPERATIVE TRANSESOPHAGEAL ECHOCARDIOGRAM  N/A 08/15/2012   Procedure: INTRAOPERATIVE TRANSESOPHAGEAL ECHOCARDIOGRAM;  Surgeon: Rexene Alberts, MD;  Location: [facility];  Service: Open Heart Surgery;  Laterality: N/A;  . MITRAL VALVE REPAIR Right 08/15/2012   Procedure: MINIMALLY INVASIVE MITRAL VALVE REPAIR (MVR);  Surgeon: Rexene Alberts, MD;  Location: [facility];  Service: Open Heart Surgery;  Laterality: Right;  . SHOULDER ARTHROSCOPY WITH ROTATOR CUFF REPAIR AND SUBACROMIAL DECOMPRESSION Left 01/27/2018   Procedure: LEFT SHOULDER ARTHROSCOPY, SUBACROMIAL DECOMPRESSION, OPEN ROTATOR CUFF REPAIR, OPEN DISTAL CLAVICLE RESECTION, OPEN BICEPS TENODESIS;  Surgeon: Netta Cedars, MD;  Location: [facility];  Service: Orthopedics;  Laterality: Left;  . TEE WITHOUT CARDIOVERSION N/A 07/04/2012   Procedure:  TRANSESOPHAGEAL ECHOCARDIOGRAM (TEE);  Surgeon: Larey Dresser, MD;  Location: [facility];  Service: Cardiovascular;  Laterality: N/A;  . TOTAL HIP ARTHROPLASTY  08/24/2011   Procedure: TOTAL HIP ARTHROPLASTY ANTERIOR APPROACH;  Surgeon: Mauri Pole, MD;  Location: [facility];  Service: Orthopedics;  Laterality: Right;       Home Medications    Prior to Admission medications   Medication Sig Start Date End Date Taking? Authorizing Provider  acetaminophen (TYLENOL) 500 MG tablet Take 1,000 mg by mouth every 6 (six) hours as needed for moderate pain or headache.     [provider]  Artificial Tear Solution (SOOTHE XP OP) Place 1 drop into both eyes daily as needed (dry eyes).    [provider]  benazepril (LOTENSIN) 20 MG tablet Take 10 mg by mouth daily.    [provider]  bisacodyl (DULCOLAX) 5 MG EC tablet Take 10 mg by mouth daily as needed for moderate constipation.    [provider]  blood glucose meter kit and supplies KIT Dispense based on insurance preference.check blood sugar 1 time daily. [id number] 01/23/18   Unk Pinto, MD  Blood Glucose Monitoring Suppl DEVI Use to test blood sugar once daily 05/01/19   Liane Comber, NP  Cholecalciferol (VITAMIN D3) 5000 units CAPS Take 5,000 Units by mouth at bedtime.    [provider]  CINNAMON PO Take 2,000 mg by mouth every Monday, Wednesday, and Friday. At bedtime    [provider]  ezetimibe (ZETIA) 10 MG tablet Take 1 tablet Daily for cholesterol 04/10/19   Unk Pinto, MD  fenofibrate micronized (LOFIBRA) 134 MG capsule Take 134 mg by mouth daily.    [provider]  glucose blood test strip Use to test blood sugar once daily 05/01/19   Liane Comber, NP  Lancets MISC Use to check blood sugar once daily 05/01/19   Liane Comber, NP  loratadine (CLARITIN) 10 MG tablet Take 10 mg by mouth daily as needed for allergies.    [provider]  mometasone  (NASONEX) 50 MCG/ACT nasal spray Place 2 sprays into the nose daily as needed (allergies).     [provider]  OVER THE COUNTER MEDICATION Apply 1 application topically daily as needed (joint pain). Glucosamine Chondroitin MSM topical cream    [provider]  tamsulosin (FLOMAX) 0.4 MG CAPS capsule Take 0.4 mg by mouth at bedtime as needed.    [provider]  warfarin (COUMADIN) 5 MG tablet Take 1 to 2 tablets daily as directed by Coumadin clinic 02/12/19   Burnell Blanks, MD    Family History Family History  Problem Relation Age of Onset  . Heart attack Father   . Hypertension Father   . Coronary artery disease Other   . Heart attack Brother 69  .  Hypertension Sister   . Hypertension Brother   . Hyperlipidemia Sister   . Diabetes Sister   . Diabetes Brother     Social History Social History   Tobacco Use  . Smoking status: Former Smoker    Types: Cigars    Quit date: 03/22/1973    Years since quitting: 46.1  . Smokeless tobacco: Current User    Types: Chew  . Tobacco comment: uses smokeless tobacco  Substance Use Topics  . Alcohol use: No    Alcohol/week: 0.0 standard drinks  . Drug use: No     Allergies   Statins and Welchol [colesevelam hcl]   Review of Systems Review of Systems   Physical Exam Triage Vital Signs ED Triage Vitals  Enc Vitals Group     BP 05/12/19 1045 (!) 119/102     Pulse Rate 05/12/19 1045 94     Resp 05/12/19 1045 17     Temp 05/12/19 1045 98.6 F (37 C)     Temp Source 05/12/19 1045 Oral     SpO2 05/12/19 1045 98 %     Weight --      Height --      Head Circumference --      Peak Flow --      Pain Score 05/12/19 1048 5     Pain Loc --      Pain Edu? --      Excl. in [place]? --    No data found.  Updated Vital Signs BP (!) 119/102 (BP Location: Left Arm) Comment: took blood pressure medication last night  Pulse 94   Temp 98.6 F (37 C) (Oral)   Resp 17   SpO2 98%   Visual Acuity Right  Eye Distance:   Left Eye Distance:   Bilateral Distance:    Right Eye Near:   Left Eye Near:    Bilateral Near:     Physical Exam Vitals and nursing note reviewed.  Constitutional:      Appearance: He is well-developed.  HENT:     Head: Normocephalic and atraumatic.     Nose: Nose normal.     Mouth/Throat:     Mouth: Mucous membranes are moist.  Eyes:     Conjunctiva/sclera: Conjunctivae normal.  Cardiovascular:     Rate and Rhythm: Normal rate and regular rhythm.     Pulses: Normal pulses.     Heart sounds: Normal heart sounds. No murmur.  Pulmonary:     Effort: Pulmonary effort is normal. No respiratory distress.     Breath sounds: Normal breath sounds.  Abdominal:     General: Bowel sounds are normal. There is no distension.     Palpations: Abdomen is soft. There is no mass.     Tenderness: There is abdominal tenderness.     Hernia: No hernia is present.  Musculoskeletal:        General: Normal range of motion.     Cervical back: Normal range of motion and neck supple.  Skin:    General: Skin is warm and dry.     Capillary Refill: Capillary refill takes less than 2 seconds.  Neurological:     General: No focal deficit present.     Mental Status: He is alert and oriented to person, place, and time.  Psychiatric:        Mood and Affect: Mood normal.        Behavior: Behavior normal.      UC Treatments / Results  Labs (  all labs ordered are listed, but only abnormal results are displayed) Labs Reviewed  POCT URINALYSIS DIP (DEVICE) - Abnormal; Notable for the following components:      Result Value   Glucose, UA 100 (*)    Protein, ur 30 (*)    All other components within normal limits  NOVEL CORONAVIRUS, NAA (HOSP ORDER, SEND-OUT TO REF LAB; TAT 18-24 HRS)    EKG   Radiology No results found.  Procedures Procedures (including critical care time)  Medications Ordered in UC Medications - No data to display  Initial Impression / Assessment and Plan  / UC Course  I have reviewed the triage vital signs and the nursing notes.  Pertinent labs & imaging results that were available during my care of the patient were reviewed by me and considered in my medical decision making (see chart for details).    Patient has had both Covid vaccines.  It has not been quite long enough, for him to be fully immunized at this time.  Send out Covid performed and will call with results.  Instructed to quarantine until we have these back and they are negative.  Address patient's elevated blood pressure, reports that he has this at times when he is at a new doctor's office and is unsure of how the process works.  Reports that he did take his blood pressure medication last night.  Instructed to continue home meds as ordered, and bring up with primary care provider next week.  UA negative for infection in office today.  Instructed on when to go to the ER. Final Clinical Impressions(s) / UC Diagnoses   Final diagnoses:  Fatigue, unspecified type  Myalgia  Nonintractable headache, unspecified chronicity pattern, unspecified headache type  Lower abdominal pain     Discharge Instructions     Your COVID test is pending.  You should self quarantine until your test result is back and is negative.    Take Tylenol as needed for fever or discomfort.  Rest and keep yourself hydrated.    Go to the emergency department if you develop high fever, shortness of breath, severe diarrhea, or other concerning symptoms.       ED Prescriptions    None     I have reviewed the PDMP during this encounter.   Faustino Congress, NP 05/12/19 [phone]

## 2019-05-13 LAB — NOVEL CORONAVIRUS, NAA (HOSP ORDER, SEND-OUT TO REF LAB; TAT 18-24 HRS): SARS-CoV-2, NAA: NOT DETECTED

## 2019-05-30 ENCOUNTER — Ambulatory Visit (INDEPENDENT_AMBULATORY_CARE_PROVIDER_SITE_OTHER): Payer: PPO | Admitting: *Deleted

## 2019-05-30 ENCOUNTER — Other Ambulatory Visit: Payer: Self-pay

## 2019-05-30 DIAGNOSIS — I2699 Other pulmonary embolism without acute cor pulmonale: Secondary | ICD-10-CM

## 2019-05-30 DIAGNOSIS — Z5181 Encounter for therapeutic drug level monitoring: Secondary | ICD-10-CM | POA: Diagnosis not present

## 2019-05-30 DIAGNOSIS — Z9889 Other specified postprocedural states: Secondary | ICD-10-CM

## 2019-05-30 DIAGNOSIS — Z7901 Long term (current) use of anticoagulants: Secondary | ICD-10-CM | POA: Diagnosis not present

## 2019-05-30 DIAGNOSIS — Z86711 Personal history of pulmonary embolism: Secondary | ICD-10-CM

## 2019-05-30 LAB — POCT INR: INR: 2.4 (ref 2.0–3.0)

## 2019-05-30 NOTE — Patient Instructions (Signed)
Description   Continue taking 1 tablet daily except 2 tablets on Mondays and Fridays. Continue normal green intake. Recheck INR in 3 weeks with MD appt. [facility] [phone]

## 2019-06-11 ENCOUNTER — Other Ambulatory Visit: Payer: Self-pay

## 2019-06-12 ENCOUNTER — Other Ambulatory Visit: Payer: Self-pay

## 2019-06-15 ENCOUNTER — Emergency Department (HOSPITAL_COMMUNITY)
Admission: EM | Admit: 2019-06-15 | Discharge: 2019-06-21 | Disposition: E | Payer: PPO | Attending: Emergency Medicine | Admitting: Emergency Medicine

## 2019-06-15 DIAGNOSIS — Z20822 Contact with and (suspected) exposure to covid-19: Secondary | ICD-10-CM | POA: Diagnosis not present

## 2019-06-15 DIAGNOSIS — I499 Cardiac arrhythmia, unspecified: Secondary | ICD-10-CM | POA: Diagnosis not present

## 2019-06-15 DIAGNOSIS — Z03818 Encounter for observation for suspected exposure to other biological agents ruled out: Secondary | ICD-10-CM | POA: Diagnosis not present

## 2019-06-15 DIAGNOSIS — E11319 Type 2 diabetes mellitus with unspecified diabetic retinopathy without macular edema: Secondary | ICD-10-CM | POA: Insufficient documentation

## 2019-06-15 DIAGNOSIS — Z7901 Long term (current) use of anticoagulants: Secondary | ICD-10-CM | POA: Insufficient documentation

## 2019-06-15 DIAGNOSIS — F17290 Nicotine dependence, other tobacco product, uncomplicated: Secondary | ICD-10-CM | POA: Diagnosis not present

## 2019-06-15 DIAGNOSIS — R0689 Other abnormalities of breathing: Secondary | ICD-10-CM | POA: Diagnosis not present

## 2019-06-15 DIAGNOSIS — I129 Hypertensive chronic kidney disease with stage 1 through stage 4 chronic kidney disease, or unspecified chronic kidney disease: Secondary | ICD-10-CM | POA: Diagnosis not present

## 2019-06-15 DIAGNOSIS — E1122 Type 2 diabetes mellitus with diabetic chronic kidney disease: Secondary | ICD-10-CM | POA: Insufficient documentation

## 2019-06-15 DIAGNOSIS — R404 Transient alteration of awareness: Secondary | ICD-10-CM | POA: Diagnosis not present

## 2019-06-15 DIAGNOSIS — I469 Cardiac arrest, cause unspecified: Secondary | ICD-10-CM | POA: Insufficient documentation

## 2019-06-15 DIAGNOSIS — N183 Chronic kidney disease, stage 3 unspecified: Secondary | ICD-10-CM | POA: Diagnosis not present

## 2019-06-15 DIAGNOSIS — I4901 Ventricular fibrillation: Secondary | ICD-10-CM | POA: Diagnosis not present

## 2019-06-15 DIAGNOSIS — Z79899 Other long term (current) drug therapy: Secondary | ICD-10-CM | POA: Insufficient documentation

## 2019-06-15 DIAGNOSIS — R000 Tachycardia, unspecified: Secondary | ICD-10-CM | POA: Diagnosis not present

## 2019-06-15 LAB — RESPIRATORY PANEL BY RT PCR (FLU A&B, COVID)
Influenza A by PCR: NEGATIVE
Influenza B by PCR: NEGATIVE
SARS Coronavirus 2 by RT PCR: NEGATIVE

## 2019-06-15 MED ORDER — EPINEPHRINE 1 MG/10ML IJ SOSY
PREFILLED_SYRINGE | INTRAMUSCULAR | Status: AC | PRN
  Administered 2019-06-15 (×3): 1 via INTRAVENOUS

## 2019-06-15 MED FILL — Medication: Qty: 1 | Status: AC

## 2019-06-18 NOTE — Telephone Encounter (Signed)
01/05/19 received cancellation notification from [employer] - Cancelled by MD, order changed to [employer] - Device returned 01/23/19

## 2019-06-21 NOTE — ED Notes (Signed)
Dr. Oneta Rack (Patient's PCP) paged to [phone] Dr. Rush Landmark paged by Marylene Land

## 2019-06-21 NOTE — ED Notes (Signed)
Repaged- Dr. Oneta Rack @ 1305-per Dr. Rush Landmark.

## 2019-06-21 NOTE — ED Provider Notes (Signed)
[facility] EMERGENCY DEPARTMENT Provider Note   CSN: [id number] Arrival date & time: July 02, 2019  1146     History Chief Complaint  Patient presents with   Cardiac Arrest    [name] is a 74 y.o. male.  The history is provided by the EMS personnel. The history is limited by the condition of the patient. No language interpreter was used.  Cardiac Arrest Witnessed by:  Not witnessed Incident location:  Home Time since incident:  1 hour Time before ALS initiated:  3-5 minutes Condition upon EMS arrival:  Unresponsive Pulse:  Absent Initial cardiac rhythm per EMS:  Ventricular fibrillation Treatments prior to arrival:  ACLS protocol, AED discharged and vascular access Medications given prior to ED:  Epinephrine, magnesium and amiodarone Defibrillation prior to arrival:  360 J Number of shocks delivered:  10 or more Defibrillation successful: no   Rhythm after defibrillation:  Unchanged IV access type:  Intraosseous and peripheral Airway:  Intubation in ED Edison Pace in field) Rhythm on admission to ED:  Ventricular fibrillation  LVL 5 caveat for Cardiac arrest     Past Medical History:  Diagnosis Date   Allergy    SEASONAL   Cataract    SMALL IN LEFT EYE   CKD (chronic kidney disease), stage III    Coronary artery disease    a. Cath 2014 - mild nonobstructive CAD - 40% prox LAD, 30% OM2, otherwise mild irregularities.   Degenerative joint disease    Frequent PVCs    Headache    Hyperlipidemia    Hypertension    Inguinal hernia    right   NICM (nonischemic cardiomyopathy) ([place])    Osteoarthritis    Pre-diabetes    Pulmonary embolism ([place])    march 2010, 2012              /   DVT x  2  LEFT 2010   S/P mitral valve repair 08/15/2012   Complex valvuloplasty including triangular resection of posterior leaflet, artificial Gore-tex neocord placement x4 and Sorin Memo 3D ring annuloplasty via right mini thoracotomy approach   Seasonal  allergies    Severe mitral regurgitation by prior echocardiogram    a. s/p MV repair 2014.   Sinus brady-tachy syndrome (HCC)    Vitamin D deficiency    Wears glasses     Patient Active Problem List   Diagnosis Date Noted   Diabetic retinopathy ([place]) 04/11/2018   CKD stage 3 due to type 2 diabetes mellitus ([place]) 04/10/2018   Encounter for therapeutic drug monitoring 08/10/2017   Encounter for Medicare annual wellness exam 02/23/2016   Hx of colonic polyps 05/31/2014   Overweight (BMI 25.0-29.9) 04/24/2014   Medication management 05/24/2013   DM type 2 causing CKD stage 3 ([place]) 05/24/2013   Hyperlipidemia associated with type 2 diabetes mellitus ([place])    Hypertension    Seasonal allergies    Osteoarthritis    Vitamin D deficiency    S/P mitral valve repair 08/15/2012   Long term current use of anticoagulant therapy 11/05/2010   History of pulmonary embolus (PE) 06/10/2008    Past Surgical History:  Procedure Laterality Date   achillis tendon     repair 20 years ago   CARDIAC CATHETERIZATION     COLONOSCOPY     COLONOSCOPY W/ BIOPSIES AND POLYPECTOMY     HIP ARTHROPLASTY     11/06/07   INGUINAL HERNIA REPAIR Right 11/07/2015   Procedure: LAPAROSCOPIC RIGHT INGUINAL HERNIA WITH MESH;  Surgeon: Ralene Ok, MD;  Location: [place];  Service: General;  Laterality: Right;   INSERTION OF MESH Right 11/07/2015   Procedure: INSERTION OF MESH;  Surgeon: Ralene Ok, MD;  Location: [place];  Service: General;  Laterality: Right;   INTRAOPERATIVE TRANSESOPHAGEAL ECHOCARDIOGRAM N/A 08/15/2012   Procedure: INTRAOPERATIVE TRANSESOPHAGEAL ECHOCARDIOGRAM;  Surgeon: Rexene Alberts, MD;  Location: [place];  Service: Open Heart Surgery;  Laterality: N/A;   MITRAL VALVE REPAIR Right 08/15/2012   Procedure: MINIMALLY INVASIVE MITRAL VALVE REPAIR (MVR);  Surgeon: Rexene Alberts, MD;  Location: [place];  Service: Open Heart Surgery;  Laterality: Right;   SHOULDER  ARTHROSCOPY WITH ROTATOR CUFF REPAIR AND SUBACROMIAL DECOMPRESSION Left 01/27/2018   Procedure: LEFT SHOULDER ARTHROSCOPY, SUBACROMIAL DECOMPRESSION, OPEN ROTATOR CUFF REPAIR, OPEN DISTAL CLAVICLE RESECTION, OPEN BICEPS TENODESIS;  Surgeon: Netta Cedars, MD;  Location: [place];  Service: Orthopedics;  Laterality: Left;   TEE WITHOUT CARDIOVERSION N/A 07/04/2012   Procedure: TRANSESOPHAGEAL ECHOCARDIOGRAM (TEE);  Surgeon: Larey Dresser, MD;  Location: [place];  Service: Cardiovascular;  Laterality: N/A;   TOTAL HIP ARTHROPLASTY  08/24/2011   Procedure: TOTAL HIP ARTHROPLASTY ANTERIOR APPROACH;  Surgeon: Mauri Pole, MD;  Location: [facility];  Service: Orthopedics;  Laterality: Right;       Family History  Problem Relation Age of Onset   Heart attack Father    Hypertension Father    Coronary artery disease Other    Heart attack Brother 65   Hypertension Sister    Hypertension Brother    Hyperlipidemia Sister    Diabetes Sister    Diabetes Brother     Social History   Tobacco Use   Smoking status: Former Smoker    Types: Cigars    Quit date: 03/22/1973    Years since quitting: 46.2   Smokeless tobacco: Current User    Types: Chew   Tobacco comment: uses smokeless tobacco  Substance Use Topics   Alcohol use: No    Alcohol/week: 0.0 standard drinks   Drug use: No    Home Medications Prior to Admission medications   Medication Sig Start Date End Date Taking? Authorizing Provider  acetaminophen (TYLENOL) 500 MG tablet Take 1,000 mg by mouth every 6 (six) hours as needed for moderate pain or headache.     [provider]  Artificial Tear Solution (SOOTHE XP OP) Place 1 drop into both eyes daily as needed (dry eyes).    [provider]  benazepril (LOTENSIN) 20 MG tablet Take 10 mg by mouth daily.    [provider]  bisacodyl (DULCOLAX) 5 MG EC tablet Take 10 mg by mouth daily as needed for moderate constipation.    [provider]  blood glucose meter kit and supplies KIT Dispense based on insurance preference.check blood sugar 1 time daily. [id number] 01/23/18   Unk Pinto, MD  Blood Glucose Monitoring Suppl DEVI Use to test blood sugar once daily 05/01/19   Liane Comber, NP  Cholecalciferol (VITAMIN D3) 5000 units CAPS Take 5,000 Units by mouth at bedtime.    [provider]  CINNAMON PO Take 2,000 mg by mouth every Monday, Wednesday, and Friday. At bedtime    [provider]  ezetimibe (ZETIA) 10 MG tablet Take 1 tablet Daily for cholesterol 04/10/19   Unk Pinto, MD  fenofibrate micronized (LOFIBRA) 134 MG capsule Take 134 mg by mouth daily.    [provider]  glucose blood test strip Use to test blood sugar once daily  05/01/19   Liane Comber, NP  Lancets MISC Use to check blood sugar once daily 05/01/19   Liane Comber, NP  loratadine (CLARITIN) 10 MG tablet Take 10 mg by mouth daily as needed for allergies.    [provider]  mometasone (NASONEX) 50 MCG/ACT nasal spray Place 2 sprays into the nose daily as needed (allergies).     [provider]  OVER THE COUNTER MEDICATION Apply 1 application topically daily as needed (joint pain). Glucosamine Chondroitin MSM topical cream    [provider]  tamsulosin (FLOMAX) 0.4 MG CAPS capsule Take 0.4 mg by mouth at bedtime as needed.    [provider]  warfarin (COUMADIN) 5 MG tablet Take 1 to 2 tablets daily as directed by Coumadin clinic 02/12/19   Burnell Blanks, MD    Allergies    Statins and Welchol [colesevelam hcl]  Review of Systems   Review of Systems  Unable to perform ROS: Patient unresponsive    Physical Exam Updated Vital Signs Temp (!) 95.5 F (35.3 C) (Temporal)    Ht 6' (1.829 m)    Wt 98.9 kg    BMI 29.57 kg/m   Physical Exam Vitals and nursing note reviewed.  Constitutional:      General: He is in acute distress.     Appearance: He is obese.  HENT:      Head: No laceration.     Comments: King airway in place on arrival      Nose: Nose normal.     Mouth/Throat:     Mouth: Mucous membranes are moist.  Eyes:     Conjunctiva/sclera: Conjunctivae normal.  Pulmonary:     Breath sounds: Rhonchi present.     Comments: Apnea  Chest:     Chest wall: No tenderness.  Abdominal:     General: Abdomen is flat.     Tenderness: There is no abdominal tenderness.  Musculoskeletal:        General: No tenderness.  Skin:    Findings: No erythema.  Neurological:     Mental Status: He is unresponsive.     GCS: GCS eye subscore is 1. GCS verbal subscore is 1. GCS motor subscore is 1.     ED Results / Procedures / Treatments   Labs (all labs ordered are listed, but only abnormal results are displayed) Labs Reviewed  RESPIRATORY PANEL BY RT PCR (FLU A&B, COVID)    EKG None  Radiology No results found.  Procedures Procedure Name: Intubation Date/Time: Jun 28, 2019 4:10 PM Performed by: Courtney Paris, MD Pre-anesthesia Checklist: Patient identified, Emergency Drugs available, Suction available and Timeout performed Laryngoscope Size: Glidescope Grade View: Grade I Tube size: 7.5 mm Number of attempts: 1 Secured at: 21 cm Tube secured with: ETT holder Dental Injury: Teeth and Oropharynx as per pre-operative assessment       (including critical care time)  Cardiopulmonary Resuscitation (CPR) Procedure Note Directed/Performed by: Gwenyth Allegra Anaiya Wisinski I personally directed ancillary staff and/or performed CPR in an effort to regain return of spontaneous circulation and to maintain cardiac, neuro and systemic perfusion.   CRITICAL CARE Performed by: Gwenyth Allegra Jeshua Ransford Total critical care time: 35 minutes Critical care time was exclusive of separately billable procedures and treating other patients. Critical care was necessary to treat or prevent imminent or life-threatening deterioration. Critical care was time spent  personally by me on the following activities: development of treatment plan with patient and/or surrogate as well as nursing, discussions with consultants, evaluation  of patient's response to treatment, examination of patient, obtaining history from patient or surrogate, ordering and performing treatments and interventions, ordering and review of laboratory studies, ordering and review of radiographic studies, pulse oximetry and re-evaluation of patient's condition.   Medications Ordered in ED Medications  EPINEPHrine (ADRENALIN) 1 MG/10ML injection (1 Syringe Intravenous Given June 18, 2019 1200)    ED Course  I have reviewed the triage vital signs and the nursing notes.  Pertinent labs & imaging results that were available during my care of the patient were reviewed by me and considered in my medical decision making (see chart for details).    MDM Rules/Calculators/A&P                      ALTARIQ GOODALL is a 74 y.o. male with a past medical history significant for CAD, CKD, prior DVT/PE on Coumadin therapy, hypertension, and hyperlipidemia who presents in cardiac arrest.  Patient brought in by EMS with approximately 1 hour of active CPR.  They report he has been shocked 10 times for V. fib arrest.  They have gotten ROSC 3 times but it did not maintain.  They report they have given him amiodarone, epinephrine x7, and magnesium boluses.  He had a King airway placed in the field and he is been in and out of CPR for the last hour.  He was reportedly down for an unknown time and was left alone for approximately 1 hour prior to being found in cardiac arrest by family.  Patient presents in cardiac arrest to the emergency department.  On arrival, chest compressions were continued.  Patient has symmetric breath sounds.  Difficult to auscultate for murmur as he had no coordinated beats.  Patient unresponsive to pain and had pupils that were 3 mm and nonresponsive.   CPR was continued for approximately 21  minutes including 4 more shocks, 3 epinephrines, and fluids were continued.  We intubated the patient and replaced the [facility] airway.  There was no hemoptysis or abnormality seen during intubation.  Patient remains unresponsive.  Patient remained in V. fib arrest.  After nearly 1-1/2 hours of restated efforts with EMS and the emergency department and despite all of the medications used, we could not get him into a coordinated sinus rhythm.  Resuscitative efforts were discontinued at 12:04 PM.  Time of death was 12:04 PM.  We will try to contact PCP for death certificate and family.  Given his complicated medical history, and his age, do not feel he is a [employer]ar[employer] case at this time.  We will test him for COVID-19 infection.  COVID-19 and flu test negative.  12:32 PM Just spoke with the patient's wife and family and informed them of the patient's passing.  They report that he was doing well this morning and had breakfast without any difficulty before laying down to want to take a nap.  They then walked in when he was not distressed and unresponsive.  They then called 911 and started the process of his resuscitation.  They report he had his Covid shots back in January and had not had other symptoms preceding this.  We will call the PCP to discuss test certificate.  Spoke with Dr. Melford Aase who will fill out the death certificate.   Final Clinical Impression(s) / ED Diagnoses Final diagnoses:  Cardiac arrest ([place])    Clinical Impression: 1. Cardiac arrest [facility])     Disposition: Expired  This note was prepared with assistance of Dragon  voice recognition software. Occasional wrong-word or sound-a-like substitutions may have occurred due to the inherent limitations of voice recognition software.     Grant Swager, Gwenyth Allegra, MD 06/28/2019 [phone]

## 2019-06-21 NOTE — ED Triage Notes (Signed)
Pt arrived by [facility] from home. Pt last seen by family 1 hr prior to being found unresponsive. Per ems, vfib on their arrival. Shocked 11 times pta and given epi x 7 pta. CPR on arrival.

## 2019-06-21 DEATH — deceased

## 2019-06-22 ENCOUNTER — Ambulatory Visit: Payer: PPO | Admitting: Cardiology

## 2019-08-06 ENCOUNTER — Ambulatory Visit: Payer: 59 | Admitting: Internal Medicine

## 2020-02-12 ENCOUNTER — Encounter: Payer: 59 | Admitting: Internal Medicine

## 2020-05-15 ENCOUNTER — Ambulatory Visit: Payer: PPO | Admitting: Adult Health
